# Patient Record
Sex: Female | Born: 1970 | Race: Black or African American | Hispanic: No | Smoking: Never smoker
Health system: Southern US, Community
[De-identification: ages and names within clinical notes are randomized; demographics above are authoritative.]

## PROBLEM LIST (undated history)

## (undated) DIAGNOSIS — K219 Gastro-esophageal reflux disease without esophagitis: Secondary | ICD-10-CM

## (undated) DIAGNOSIS — K859 Acute pancreatitis without necrosis or infection, unspecified: Secondary | ICD-10-CM

## (undated) DIAGNOSIS — I82409 Acute embolism and thrombosis of unspecified deep veins of unspecified lower extremity: Secondary | ICD-10-CM

## (undated) DIAGNOSIS — K5792 Diverticulitis of intestine, part unspecified, without perforation or abscess without bleeding: Secondary | ICD-10-CM

## (undated) DIAGNOSIS — I499 Cardiac arrhythmia, unspecified: Secondary | ICD-10-CM

## (undated) DIAGNOSIS — Z789 Other specified health status: Secondary | ICD-10-CM

## (undated) DIAGNOSIS — F329 Major depressive disorder, single episode, unspecified: Secondary | ICD-10-CM

## (undated) DIAGNOSIS — G4730 Sleep apnea, unspecified: Secondary | ICD-10-CM

## (undated) DIAGNOSIS — F32A Depression, unspecified: Secondary | ICD-10-CM

## (undated) DIAGNOSIS — R1310 Dysphagia, unspecified: Secondary | ICD-10-CM

## (undated) DIAGNOSIS — M1990 Unspecified osteoarthritis, unspecified site: Secondary | ICD-10-CM

## (undated) DIAGNOSIS — Z8614 Personal history of Methicillin resistant Staphylococcus aureus infection: Secondary | ICD-10-CM

## (undated) DIAGNOSIS — R5383 Other fatigue: Secondary | ICD-10-CM

## (undated) DIAGNOSIS — I2699 Other pulmonary embolism without acute cor pulmonale: Secondary | ICD-10-CM

## (undated) DIAGNOSIS — G43909 Migraine, unspecified, not intractable, without status migrainosus: Secondary | ICD-10-CM

## (undated) DIAGNOSIS — R110 Nausea: Secondary | ICD-10-CM

## (undated) DIAGNOSIS — Z9289 Personal history of other medical treatment: Secondary | ICD-10-CM

## (undated) DIAGNOSIS — J84113 Idiopathic non-specific interstitial pneumonitis: Secondary | ICD-10-CM

## (undated) DIAGNOSIS — R7303 Prediabetes: Secondary | ICD-10-CM

## (undated) DIAGNOSIS — F4310 Post-traumatic stress disorder, unspecified: Secondary | ICD-10-CM

## (undated) DIAGNOSIS — K8020 Calculus of gallbladder without cholecystitis without obstruction: Secondary | ICD-10-CM

## (undated) DIAGNOSIS — Z87442 Personal history of urinary calculi: Secondary | ICD-10-CM

## (undated) DIAGNOSIS — D649 Anemia, unspecified: Secondary | ICD-10-CM

## (undated) DIAGNOSIS — J381 Polyp of vocal cord and larynx: Secondary | ICD-10-CM

## (undated) DIAGNOSIS — J45909 Unspecified asthma, uncomplicated: Secondary | ICD-10-CM

## (undated) DIAGNOSIS — I10 Essential (primary) hypertension: Secondary | ICD-10-CM

## (undated) DIAGNOSIS — F319 Bipolar disorder, unspecified: Secondary | ICD-10-CM

## (undated) DIAGNOSIS — R499 Unspecified voice and resonance disorder: Secondary | ICD-10-CM

## (undated) DIAGNOSIS — Z22322 Carrier or suspected carrier of Methicillin resistant Staphylococcus aureus: Secondary | ICD-10-CM

## (undated) DIAGNOSIS — K921 Melena: Secondary | ICD-10-CM

## (undated) DIAGNOSIS — Z8719 Personal history of other diseases of the digestive system: Secondary | ICD-10-CM

## (undated) HISTORY — PX: DILATION AND CURETTAGE OF UTERUS: SHX78

## (undated) HISTORY — DX: Acute embolism and thrombosis of unspecified deep veins of unspecified lower extremity: I82.409

## (undated) HISTORY — PX: BACK SURGERY: SHX140

## (undated) HISTORY — PX: CHOLECYSTECTOMY: SHX55

## (undated) HISTORY — PX: WISDOM TOOTH EXTRACTION: SHX21

## (undated) HISTORY — DX: Melena: K92.1

## (undated) HISTORY — DX: Unspecified voice and resonance disorder: R49.9

## (undated) HISTORY — DX: Calculus of gallbladder without cholecystitis without obstruction: K80.20

## (undated) HISTORY — DX: Nausea: R11.0

## (undated) HISTORY — DX: Dysphagia, unspecified: R13.10

## (undated) HISTORY — DX: Migraine, unspecified, not intractable, without status migrainosus: G43.909

## (undated) HISTORY — PX: COLON SURGERY: SHX602

## (undated) HISTORY — DX: Other fatigue: R53.83

## (undated) HISTORY — PX: NASAL POLYP SURGERY: SHX186

## (undated) HISTORY — DX: Other pulmonary embolism without acute cor pulmonale: I26.99

## (undated) HISTORY — PX: COLONOSCOPY: SHX174

---
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
## 1994-07-13 HISTORY — PX: TUBAL LIGATION: SHX77

## 2000-12-31 ENCOUNTER — Other Ambulatory Visit: Admission: RE | Admit: 2000-12-31 | Discharge: 2000-12-31 | Payer: Self-pay | Admitting: Obstetrics and Gynecology

## 2001-05-19 ENCOUNTER — Ambulatory Visit (HOSPITAL_COMMUNITY): Admission: RE | Admit: 2001-05-19 | Discharge: 2001-05-19 | Payer: Self-pay | Admitting: Obstetrics and Gynecology

## 2001-05-19 ENCOUNTER — Encounter: Payer: Self-pay | Admitting: Obstetrics and Gynecology

## 2001-07-27 ENCOUNTER — Emergency Department (HOSPITAL_COMMUNITY): Admission: EM | Admit: 2001-07-27 | Discharge: 2001-07-27 | Payer: Self-pay | Admitting: Emergency Medicine

## 2001-07-30 ENCOUNTER — Emergency Department (HOSPITAL_COMMUNITY): Admission: EM | Admit: 2001-07-30 | Discharge: 2001-07-30 | Payer: Self-pay | Admitting: Emergency Medicine

## 2001-09-21 ENCOUNTER — Emergency Department (HOSPITAL_COMMUNITY): Admission: EM | Admit: 2001-09-21 | Discharge: 2001-09-21 | Payer: Self-pay | Admitting: Emergency Medicine

## 2001-09-28 ENCOUNTER — Emergency Department (HOSPITAL_COMMUNITY): Admission: EM | Admit: 2001-09-28 | Discharge: 2001-09-28 | Payer: Self-pay

## 2001-12-15 ENCOUNTER — Emergency Department (HOSPITAL_COMMUNITY): Admission: EM | Admit: 2001-12-15 | Discharge: 2001-12-15 | Payer: Self-pay | Admitting: Emergency Medicine

## 2002-04-04 ENCOUNTER — Encounter: Payer: Self-pay | Admitting: Emergency Medicine

## 2002-04-04 ENCOUNTER — Emergency Department (HOSPITAL_COMMUNITY): Admission: EM | Admit: 2002-04-04 | Discharge: 2002-04-04 | Payer: Self-pay | Admitting: Emergency Medicine

## 2002-05-06 ENCOUNTER — Emergency Department (HOSPITAL_COMMUNITY): Admission: EM | Admit: 2002-05-06 | Discharge: 2002-05-06 | Payer: Self-pay

## 2002-10-29 ENCOUNTER — Emergency Department (HOSPITAL_COMMUNITY): Admission: EM | Admit: 2002-10-29 | Discharge: 2002-10-29 | Payer: Self-pay | Admitting: Emergency Medicine

## 2003-02-03 ENCOUNTER — Emergency Department (HOSPITAL_COMMUNITY): Admission: EM | Admit: 2003-02-03 | Discharge: 2003-02-03 | Payer: Self-pay | Admitting: Emergency Medicine

## 2004-02-13 ENCOUNTER — Emergency Department (HOSPITAL_COMMUNITY): Admission: EM | Admit: 2004-02-13 | Discharge: 2004-02-13 | Payer: Self-pay | Admitting: Emergency Medicine

## 2005-07-16 ENCOUNTER — Emergency Department (HOSPITAL_COMMUNITY): Admission: EM | Admit: 2005-07-16 | Discharge: 2005-07-17 | Payer: Self-pay | Admitting: Emergency Medicine

## 2006-04-10 ENCOUNTER — Emergency Department (HOSPITAL_COMMUNITY): Admission: EM | Admit: 2006-04-10 | Discharge: 2006-04-11 | Payer: Self-pay | Admitting: Emergency Medicine

## 2008-12-09 ENCOUNTER — Emergency Department (HOSPITAL_COMMUNITY): Admission: EM | Admit: 2008-12-09 | Discharge: 2008-12-09 | Payer: Self-pay | Admitting: Emergency Medicine

## 2008-12-12 ENCOUNTER — Emergency Department (HOSPITAL_COMMUNITY): Admission: EM | Admit: 2008-12-12 | Discharge: 2008-12-12 | Payer: Self-pay | Admitting: *Deleted

## 2008-12-14 ENCOUNTER — Emergency Department (HOSPITAL_COMMUNITY): Admission: EM | Admit: 2008-12-14 | Discharge: 2008-12-14 | Payer: Self-pay | Admitting: *Deleted

## 2009-03-22 ENCOUNTER — Ambulatory Visit: Payer: Self-pay | Admitting: Diagnostic Radiology

## 2009-03-22 ENCOUNTER — Emergency Department (HOSPITAL_BASED_OUTPATIENT_CLINIC_OR_DEPARTMENT_OTHER): Admission: EM | Admit: 2009-03-22 | Discharge: 2009-03-22 | Payer: Self-pay | Admitting: Emergency Medicine

## 2009-05-10 ENCOUNTER — Emergency Department: Payer: Self-pay | Admitting: Emergency Medicine

## 2009-08-29 ENCOUNTER — Emergency Department: Payer: Self-pay | Admitting: Emergency Medicine

## 2009-09-23 ENCOUNTER — Emergency Department: Payer: Self-pay | Admitting: Emergency Medicine

## 2010-04-15 ENCOUNTER — Encounter: Payer: Self-pay | Admitting: Otolaryngology

## 2010-05-13 ENCOUNTER — Encounter: Payer: Self-pay | Admitting: Otolaryngology

## 2010-05-26 ENCOUNTER — Ambulatory Visit: Payer: Self-pay | Admitting: Unknown Physician Specialty

## 2010-08-08 ENCOUNTER — Ambulatory Visit: Payer: Self-pay | Admitting: Unknown Physician Specialty

## 2010-10-01 ENCOUNTER — Inpatient Hospital Stay (INDEPENDENT_AMBULATORY_CARE_PROVIDER_SITE_OTHER)
Admission: RE | Admit: 2010-10-01 | Discharge: 2010-10-01 | Disposition: A | Discharge status: Home / Self Care | Payer: BC Managed Care – PPO | Source: Ambulatory Visit | Attending: Emergency Medicine | Admitting: Emergency Medicine

## 2010-10-01 DIAGNOSIS — H00019 Hordeolum externum unspecified eye, unspecified eyelid: Secondary | ICD-10-CM

## 2010-10-01 DIAGNOSIS — G43009 Migraine without aura, not intractable, without status migrainosus: Secondary | ICD-10-CM

## 2010-10-17 LAB — CBC
HCT: 35 % — ABNORMAL LOW (ref 36.0–46.0)
MCHC: 33.7 g/dL (ref 30.0–36.0)
MCV: 85.4 fL (ref 78.0–100.0)
Platelets: 279 10*3/uL (ref 150–400)
RBC: 4.1 MIL/uL (ref 3.87–5.11)
WBC: 9.9 10*3/uL (ref 4.0–10.5)

## 2010-10-17 LAB — COMPREHENSIVE METABOLIC PANEL
BUN: 6 mg/dL (ref 6–23)
CO2: 27 mEq/L (ref 19–32)
Chloride: 105 mEq/L (ref 96–112)
Creatinine, Ser: 0.8 mg/dL (ref 0.4–1.2)
GFR calc non Af Amer: >60 mL/min (ref >60)
Total Bilirubin: 0.2 mg/dL — ABNORMAL LOW (ref 0.3–1.2)

## 2010-10-17 LAB — URINALYSIS, ROUTINE W REFLEX MICROSCOPIC
Hgb urine dipstick: NEGATIVE
Protein, ur: NEGATIVE mg/dL
Urobilinogen, UA: 0.2 mg/dL (ref 0.0–1.0)

## 2010-10-17 LAB — DIFFERENTIAL
Basophils Absolute: 0 10*3/uL (ref 0.0–0.1)
Lymphocytes Relative: 37 % (ref 12–46)
Neutro Abs: 5.2 10*3/uL (ref 1.7–7.7)
Neutrophils Relative %: 53 % (ref 43–77)

## 2010-10-17 LAB — LIPASE, BLOOD: Lipase: 276 U/L (ref 23–300)

## 2010-10-17 LAB — PREGNANCY, URINE: Preg Test, Ur: NEGATIVE

## 2010-10-20 LAB — POCT PREGNANCY, URINE: Preg Test, Ur: NEGATIVE

## 2010-10-21 LAB — DIFFERENTIAL
Basophils Relative: 1 % (ref 0–1)
Lymphocytes Relative: 25 % (ref 12–46)
Monocytes Absolute: 1.2 10*3/uL — ABNORMAL HIGH (ref 0.1–1.0)
Monocytes Relative: 10 % (ref 3–12)
Neutro Abs: 7.9 10*3/uL — ABNORMAL HIGH (ref 1.7–7.7)

## 2010-10-21 LAB — COMPREHENSIVE METABOLIC PANEL
Albumin: 3.6 g/dL (ref 3.5–5.2)
Alkaline Phosphatase: 40 U/L (ref 39–117)
BUN: 6 mg/dL (ref 6–23)
Potassium: 3.4 mEq/L — ABNORMAL LOW (ref 3.5–5.1)
Total Protein: 6.5 g/dL (ref 6.0–8.3)

## 2010-10-21 LAB — CBC
HCT: 38.3 % (ref 36.0–46.0)
Platelets: 214 10*3/uL (ref 150–400)
RDW: 14.2 % (ref 11.5–15.5)

## 2011-04-06 ENCOUNTER — Emergency Department (HOSPITAL_COMMUNITY): Payer: BC Managed Care – PPO

## 2011-04-06 ENCOUNTER — Emergency Department (HOSPITAL_COMMUNITY)
Admission: EM | Admit: 2011-04-06 | Discharge: 2011-04-06 | Disposition: A | Discharge status: Home / Self Care | Payer: BC Managed Care – PPO | Attending: Emergency Medicine | Admitting: Emergency Medicine

## 2011-04-06 DIAGNOSIS — R079 Chest pain, unspecified: Secondary | ICD-10-CM | POA: Insufficient documentation

## 2011-04-06 DIAGNOSIS — M940 Chondrocostal junction syndrome [Tietze]: Secondary | ICD-10-CM | POA: Insufficient documentation

## 2011-04-06 DIAGNOSIS — K859 Acute pancreatitis without necrosis or infection, unspecified: Secondary | ICD-10-CM | POA: Insufficient documentation

## 2011-04-06 DIAGNOSIS — R109 Unspecified abdominal pain: Secondary | ICD-10-CM | POA: Insufficient documentation

## 2011-04-06 DIAGNOSIS — R110 Nausea: Secondary | ICD-10-CM | POA: Insufficient documentation

## 2011-04-06 LAB — COMPREHENSIVE METABOLIC PANEL
ALT: 29 U/L (ref 0–35)
AST: 21 U/L (ref 0–37)
Alkaline Phosphatase: 42 U/L (ref 39–117)
CO2: 28 mEq/L (ref 19–32)
GFR calc Af Amer: >60 mL/min (ref >60)
Glucose, Bld: 88 mg/dL (ref 70–99)
Potassium: 4.1 mEq/L (ref 3.5–5.1)
Sodium: 138 mEq/L (ref 135–145)
Total Protein: 7.4 g/dL (ref 6.0–8.3)

## 2011-04-06 LAB — URINALYSIS, ROUTINE W REFLEX MICROSCOPIC
Glucose, UA: NEGATIVE mg/dL
Hgb urine dipstick: NEGATIVE
Specific Gravity, Urine: 1.012 (ref 1.005–1.030)
pH: 6 (ref 5.0–8.0)

## 2011-04-06 LAB — DIFFERENTIAL
Basophils Absolute: 0.1 10*3/uL (ref 0.0–0.1)
Basophils Relative: 1 % (ref 0–1)
Neutro Abs: 5 10*3/uL (ref 1.7–7.7)
Neutrophils Relative %: 53 % (ref 43–77)

## 2011-04-06 LAB — URINE MICROSCOPIC-ADD ON

## 2011-04-06 LAB — CBC
Hemoglobin: 13.5 g/dL (ref 12.0–15.0)
MCHC: 34.4 g/dL (ref 30.0–36.0)
Platelets: 239 10*3/uL (ref 150–400)
RBC: 4.74 MIL/uL (ref 3.87–5.11)

## 2011-04-06 LAB — CK TOTAL AND CKMB (NOT AT ARMC): Relative Index: 1.2 (ref 0.0–2.5)

## 2011-04-09 ENCOUNTER — Other Ambulatory Visit: Payer: Self-pay | Admitting: Specialist

## 2011-04-09 DIAGNOSIS — R1011 Right upper quadrant pain: Secondary | ICD-10-CM

## 2011-04-10 ENCOUNTER — Ambulatory Visit
Admission: RE | Admit: 2011-04-10 | Discharge: 2011-04-10 | Disposition: A | Discharge status: Home / Self Care | Payer: BC Managed Care – PPO | Source: Ambulatory Visit | Attending: Specialist | Admitting: Specialist

## 2011-04-10 DIAGNOSIS — R1011 Right upper quadrant pain: Secondary | ICD-10-CM

## 2011-04-10 DIAGNOSIS — K8020 Calculus of gallbladder without cholecystitis without obstruction: Secondary | ICD-10-CM

## 2011-04-10 HISTORY — DX: Calculus of gallbladder without cholecystitis without obstruction: K80.20

## 2011-04-23 ENCOUNTER — Encounter (INDEPENDENT_AMBULATORY_CARE_PROVIDER_SITE_OTHER): Payer: Self-pay | Admitting: Surgery

## 2011-04-23 ENCOUNTER — Ambulatory Visit (INDEPENDENT_AMBULATORY_CARE_PROVIDER_SITE_OTHER): Payer: BC Managed Care – PPO | Admitting: Surgery

## 2011-04-23 ENCOUNTER — Inpatient Hospital Stay (HOSPITAL_COMMUNITY)
Admission: AD | Admit: 2011-04-23 | Discharge: 2011-04-26 | DRG: 494 | Disposition: A | Discharge status: Home / Self Care | Payer: BC Managed Care – PPO | Source: Ambulatory Visit | Attending: Surgery | Admitting: Surgery

## 2011-04-23 VITALS — BP 114/86 | HR 64 | Temp 96.6°F | Resp 20 | Ht 67.5 in | Wt 261.4 lb

## 2011-04-23 DIAGNOSIS — Z79899 Other long term (current) drug therapy: Secondary | ICD-10-CM

## 2011-04-23 DIAGNOSIS — K812 Acute cholecystitis with chronic cholecystitis: Secondary | ICD-10-CM

## 2011-04-23 DIAGNOSIS — K8000 Calculus of gallbladder with acute cholecystitis without obstruction: Principal | ICD-10-CM | POA: Diagnosis present

## 2011-04-23 DIAGNOSIS — K8010 Calculus of gallbladder with chronic cholecystitis without obstruction: Secondary | ICD-10-CM | POA: Diagnosis present

## 2011-04-23 LAB — COMPREHENSIVE METABOLIC PANEL
Alkaline Phosphatase: 37 U/L — ABNORMAL LOW (ref 39–117)
BUN: 9 mg/dL (ref 6–23)
Chloride: 103 mEq/L (ref 96–112)
GFR calc Af Amer: >90 mL/min (ref >90)
Glucose, Bld: 85 mg/dL (ref 70–99)
Potassium: 4.2 mEq/L (ref 3.5–5.1)
Total Bilirubin: 0.2 mg/dL — ABNORMAL LOW (ref 0.3–1.2)

## 2011-04-23 LAB — CBC
Hemoglobin: 12.2 g/dL (ref 12.0–15.0)
MCH: 27.9 pg (ref 26.0–34.0)
MCV: 82.6 fL (ref 78.0–100.0)
RBC: 4.38 MIL/uL (ref 3.87–5.11)

## 2011-04-23 LAB — AMYLASE: Amylase: 110 U/L — ABNORMAL HIGH (ref 0–105)

## 2011-04-23 LAB — LIPASE, BLOOD: Lipase: 111 U/L — ABNORMAL HIGH (ref 11–59)

## 2011-04-23 NOTE — Patient Instructions (Signed)
Gallbladder Disease (Cholecystitis) Gallbladder disease (cholecystitis) is an inflammation of your gallbladder. It is usually caused by a build-up of stones (gallstones) or sludge (cholelithiasis) in your gallbladder. The gallbladder is not an essential organ. It is located slightly to the right of center in the belly (abdomen), behind the liver. It stores bile made in the liver. Bile aids in digestion of fats. Gallbladder disease may result in nausea (feeling sick to your stomach), abdominal pain, and jaundice. In severe cases, emergency surgery may be required. The most common type of gallbladder disease is gallstones. They begin as small crystals and slowly grow into stones. Gallstone pain occurs when the bile duct has spasms. The spasms are caused by the stone passing out of the duct. The stone is trying to pass at the same time bile is passing into the small bowel for digestion. The pain usually begins suddenly. It may persist from several minutes to several hours. Infection can occur. Infection can add to discomfort and severity of an acute attack. The pain may be made worse by breathing deeply or by being jarred. There may be fever and tenderness to the touch. In some cases, when gallstones do not move into the bile duct, people have no pain or symptoms. These are called "silent" gallstones. Women are three times more likely to develop gallstones than men. Women who have had several pregnancies are more likely to have gallbladder disease. Physicians sometimes advise removing diseased gallbladders before future pregnancies. Other factors that increase the risk of gallbladder disease are obesity, diets heavy in fried foods and dairy products, increasing age, prolonged use of medications containing female hormones, and heredity. HOME CARE INSTRUCTIONS  If your physician prescribed an antibiotic, take as directed.   Only take over-the-counter or prescription medicines for pain, discomfort, or fever as  directed by your caregiver.   Follow a low fat diet until seen again. (Fat causes the gallbladder to contract.)   Follow-up as instructed. Attacks are almost always recurrent and surgery is usually required for permanent treatment.  SEEK IMMEDIATE MEDICAL CARE IF:  Pain is increasing and not controlled by medications.   The pain moves to another part of your abdomen or to your back. (Right sided pain can be appendicitis and left sided pain in adults can be diverticulitis).   You have an oral temperature above 101 develops, is not controlled by medication, or as your caregiver suggests.   You develop nausea and vomiting.  Document Released: 06/29/2005 Document Re-Released: 12/16/2007 [facility] Patient Information 2011 [place], [place].

## 2011-04-23 NOTE — Progress Notes (Signed)
Chief Complaint  Patient presents with  . Other    new pt eval of GB with stones and rip in the bile duct     HPI Lisa [name] is a 40 y.o. female.   HPI The patient presents with a three-day history of epigastric and right upper quarter pain. She seen emergency room about 3 weeks ago for chest pain. She is found to have pancreatitis with a lipase of 294 he was discharged home. She had a follow up ultrasound 928 2012 which showed multiple gallstones and a common bile duct measuring 9.6 mm. Over the last 3 days, she developed more right upper quarter pain 5-6/10 with associated nausea no vomiting. She had loss of appetite. The pain is severe. She denies any fever or chills.  Past Medical History  Diagnosis Date  . Trouble swallowing   . Change in voice   . Chest pain   . Abdominal pain   . Blood in stool   . Constipation   . Nausea   . Migraines   . Fatigue     Past Surgical History  Procedure Date  . Nasal polyp surgery   . Wisdom tooth extraction     History reviewed. No pertinent family history.  Social History History  Substance Use Topics  . Smoking status: Never Smoker   . Smokeless tobacco: Never Used  . Alcohol Use: No    No Known Allergies  Current Outpatient Prescriptions  Medication Sig Dispense Refill  . famotidine (PEPCID) 20 MG tablet Take 20 mg by mouth daily.        . methocarbamol (ROBAXIN) 500 MG tablet Take 500 mg by mouth 2 (two) times daily.        Marland Kitchen omeprazole (PRILOSEC) 20 MG capsule Take 20 mg by mouth 2 (two) times daily.          Review of Systems Review of Systems  Constitutional: Positive for activity change and appetite change. Negative for fever and chills.  HENT: Negative.   Eyes: Negative.   Cardiovascular: Negative.   Gastrointestinal: Positive for nausea and abdominal pain.  Genitourinary: Negative.   Musculoskeletal: Negative.   Skin: Negative.   Hematological: Negative.   Psychiatric/Behavioral: Negative.      Blood pressure 114/86, pulse 64, temperature 96.6 F (35.9 C), resp. rate 20, height 5' 7.5" (1.715 m), weight 261 lb 6 oz (118.559 kg), last menstrual period 03/15/2011.  Physical Exam Physical Exam  Constitutional: She is oriented to person, place, and time. She appears well-developed and well-nourished.  HENT:  Head: Normocephalic and atraumatic.  Eyes: EOM are normal. Pupils are equal, round, and reactive to light.  Neck: Normal range of motion. Neck supple.  Cardiovascular: Normal rate, regular rhythm and normal heart sounds.   Pulmonary/Chest: Effort normal and breath sounds normal.  Abdominal: There is tenderness.       RUQ tenderness to palpation.  Positive Murphy's sign.  Musculoskeletal: Normal range of motion.  Neurological: She is alert and oriented to person, place, and time.  Skin: Skin is warm and dry.    Data Reviewed U/S  04/10/2011  MULTIPLE GALLSTONES cbd 9.6 mm  Lipase 294 04/06/2011  96 on 04/10/2011 Assessment    Acute on chronic cholecystitis History of gallstone pancreatitis    Plan    Admit to [facility] MD service at [facility] Dr  Jamey Ripa  to follow up in am.  IVF,  ABX probable laparoscopic cholecystectomy and cholangiogram.       Booker Bhatnagar  A. 04/23/2011, 5:30 PM

## 2011-04-24 ENCOUNTER — Inpatient Hospital Stay (HOSPITAL_COMMUNITY): Payer: BC Managed Care – PPO

## 2011-04-24 DIAGNOSIS — K8010 Calculus of gallbladder with chronic cholecystitis without obstruction: Secondary | ICD-10-CM

## 2011-04-24 LAB — COMPREHENSIVE METABOLIC PANEL
Albumin: 3.2 g/dL — ABNORMAL LOW (ref 3.5–5.2)
Alkaline Phosphatase: 34 U/L — ABNORMAL LOW (ref 39–117)
BUN: 8 mg/dL (ref 6–23)
Creatinine, Ser: 0.72 mg/dL (ref 0.50–1.10)
Potassium: 3.5 mEq/L (ref 3.5–5.1)
Total Protein: 6.3 g/dL (ref 6.0–8.3)

## 2011-04-24 LAB — LIPASE, BLOOD
Lipase: 78 U/L — ABNORMAL HIGH (ref 11–59)
Lipase: 88 U/L — ABNORMAL HIGH (ref 11–59)

## 2011-04-24 LAB — CBC
Hemoglobin: 11.7 g/dL — ABNORMAL LOW (ref 12.0–15.0)
MCHC: 34 g/dL (ref 30.0–36.0)
RBC: 4.17 MIL/uL (ref 3.87–5.11)
WBC: 7.9 10*3/uL (ref 4.0–10.5)

## 2011-04-24 LAB — TYPE AND SCREEN
ABO/RH(D): B POS
Antibody Screen: NEGATIVE

## 2011-04-24 LAB — HEPATIC FUNCTION PANEL: Total Protein: 7.1 g/dL (ref 6.0–8.3)

## 2011-04-24 LAB — PREGNANCY, URINE: Preg Test, Ur: NEGATIVE

## 2011-04-24 LAB — ABO/RH: ABO/RH(D): B POS

## 2011-04-24 MED ORDER — IOHEXOL 300 MG/ML  SOLN
100.0000 mL | Freq: Once | INTRAMUSCULAR | Status: AC | PRN
  Administered 2011-04-24: 100 mL via INTRAVENOUS

## 2011-04-25 ENCOUNTER — Other Ambulatory Visit (INDEPENDENT_AMBULATORY_CARE_PROVIDER_SITE_OTHER): Payer: Self-pay | Admitting: Surgery

## 2011-04-25 ENCOUNTER — Inpatient Hospital Stay (HOSPITAL_COMMUNITY): Payer: BC Managed Care – PPO

## 2011-04-25 ENCOUNTER — Encounter (INDEPENDENT_AMBULATORY_CARE_PROVIDER_SITE_OTHER): Payer: Self-pay | Admitting: Surgery

## 2011-04-25 HISTORY — PX: LAPAROSCOPIC CHOLECYSTECTOMY W/ CHOLANGIOGRAPHY: SUR757

## 2011-04-25 LAB — CBC
HCT: 35.2 % — ABNORMAL LOW (ref 36.0–46.0)
Hemoglobin: 12 g/dL (ref 12.0–15.0)
MCH: 28.4 pg (ref 26.0–34.0)
MCHC: 34.1 g/dL (ref 30.0–36.0)
MCV: 83.2 fL (ref 78.0–100.0)
Platelets: 241 10*3/uL (ref 150–400)
RBC: 4.23 MIL/uL (ref 3.87–5.11)
RDW: 13.3 % (ref 11.5–15.5)
WBC: 6.6 10*3/uL (ref 4.0–10.5)

## 2011-04-25 LAB — HEPATIC FUNCTION PANEL
AST: 16 U/L (ref 0–37)
Albumin: 3.1 g/dL — ABNORMAL LOW (ref 3.5–5.2)
Alkaline Phosphatase: 35 U/L — ABNORMAL LOW (ref 39–117)
Total Bilirubin: 0.3 mg/dL (ref 0.3–1.2)

## 2011-04-25 NOTE — H&P (Signed)
  NAMEDAVIA, SMYRE NO.:  [id number]  MEDICAL RECORD NO.:  [id number]  LOCATION:  5017                         FACILITY:  [facility]  PHYSICIAN:  Maisie Fus A. Tawnia Schirm, M.D.DATE OF BIRTH:  1970-09-14  DATE OF ADMISSION:  04/23/2011 DATE OF DISCHARGE:  04/06/2011                             HISTORY & PHYSICAL   CHIEF COMPLAINT:  Abdominal pain.  HISTORY OF PRESENT ILLNESS:  The patient is a 40 year old female with right upper quadrant pain x3 days, severe in nature, located in right upper quadrant, radiation in to her chest.  She was seen in about 3 weeks ago in the emergency room due to pancreatitis.  She was sent home and had a followup ultrasound showed multiple gallstones and a common bile duct measuring 9.6 mm.  PAST MEDICAL HISTORY:  Vocal cord polyps, constipation, nausea, fatigue.  PAST SURGICAL HISTORY:  Nasal polyp surgery and wisdom tooth extraction.  SOCIAL HISTORY:  Does not smoke or drink alcohol.  MEDICATIONS:1. Pepcid 20 mg daily. 2. Robaxin 500 mg daily. 3. Prilosec 20 mg daily.  FAMILY HISTORY:  Noncontributory.  SOCIAL HISTORY:  Denies tobacco use.  PHYSICAL EXAMINATION:  HEENT:  No jaundice.  Oropharynx moist. NECK:  Supple.  Nontender.  Trachea midline. PULMONARY:  Lung sounds are clear. CARDIOVASCULAR:  Regular rate and rhythm without rub, murmur, or gallop. ABDOMEN:  Tender in right upper quadrant.  Positive Murphy sign. EXTREMITIES:  No edema.  DIAGNOSTIC STUDIES:  Ultrasound from April 10, 2011, showed small gallstones in common duct which is 9.6 mm.  Lipase on April 06, 2011 was 294.  IMPRESSION: 1. Acute on chronic cholecystitis. 2. History of gallstone pancreatitis.  PLAN:  To be admitted for IV fluids under Dr. Maia Plan service at [facility].  Continue IV antibiotics and probable laparoscopic cholecystectomy with cholangiogram in the next 24-48 hours.     Zaryan Yakubov A. Sid Greener, M.D.     TAC/MEDQ  D:   04/23/2011  T:  04/23/2011  Job:  [id number]  Electronically Signed by Harriette Bouillon M.D. on 04/25/2011 09:22:27 PM

## 2011-04-26 NOTE — Op Note (Signed)
NAMEMarland Kitchen  Lisa [name], Lisa [name] NO.:  [id number]  MEDICAL RECORD NO.:  [id number]  LOCATION:  5017                         FACILITY:  [facility]  PHYSICIAN:  Currie Paris, M.D.DATE OF BIRTH:  1971-03-28  DATE OF PROCEDURE: DATE OF DISCHARGE:                              OPERATIVE REPORT   PREOPERATIVE DIAGNOSIS:  Gallstones with recent biliary pancreatitis.  POSTOPERATIVE DIAGNOSIS:  Gallstones with recent biliary pancreatitis.  PROCEDURE:  Laparoscopic cholecystectomy with operative cholangiogram.  SURGEON:  Currie Paris, MD  ASSISTANT:  Clovis Pu. Cornett, MD  ANESTHESIA:  General endotracheal.  CLINICAL HISTORY:  This is a 40 year old woman who was diagnosed with a mild case of biliary pancreatitis and gallstones a few weeks ago, was sent home from the emergency department and came to our office on April 23, 2011, with increasing pain.  She was admitted that evening and had some elevated pancreatic function tests.  The next day, she continued to have some pain and some elevated function tests, so we obtained a CT scan which basically showed no significant pancreatitis with very mild changes.  This was thought, therefore, to be a very mild biliary pancreatitis and gallstones, so we elected to proceed to surgery today for cholecystectomy with cholangiogram.  DESCRIPTION OF PROCEDURE:  I saw the patient again in the holding area and confirmed that she had no further questions and understood the plans for the procedure.  She was then taken to the operating room and after satisfactory general endotracheal anesthesia had been obtained, the abdomen was prepped and draped.  The time-out was done.  Plain Marcaine 0.25% was used for each incision.  The umbilical incision was made, the fascia identified and opened, and the peritoneal cavity entered under direct vision.  A pursestring was placed, the Hasson introduced, and the abdomen insufflated to  15.  Exam of the abdomen with the camera in did not show any gross abnormalities.  The gallbladder was somewhat contracted as previously noted on an ultrasound.  The patient was placed in reverse Trendelenburg and tilted to the left. Under direct vision, a 10 x 11 trocar was placed in the epigastrium and two 5 mm laterally.  The gallbladder was retracted over the liver and the infundibulum retracted inferolaterally.  I opened the peritoneum over the cystic duct and identified a nice long segment of cystic duct and a segment of cystic artery.  I put a clip on the cystic duct at the junction of the gallbladder and one on the cystic artery. A Cook catheter was introduced percutaneously and threaded into the cystic duct and operative angiography done.  There appeared to be no gross abnormalities with good filling of the duodenum, although the duct was a little bit plump.  The catheter was removed and 3 clips placed on the cystic duct and it was divided completely.  The cystic artery was divided after additional clips were applied.  A posterior branch was clipped and divided.  The gallbladder was removed from below to above with coagulation current of the cautery.  It was placed in a bag.  We made sure the bed of the gallbladder was dry.  The gallbladder was brought out of the umbilical port site.  The abdomen  was reinsufflated and there had been no bleeding.  The lateral ports were removed under direct vision and they did not bleed.  The umbilical site was closed with a pursestring and we had a good closure checking with the camera in the epigastric port.  The abdomen was deflated through the epigastric port.  Skin was closed with 4-0 Monocryl subcuticular plus Dermabond.  The patient tolerated the procedure well.  There were no operative complications.  All counts were correct.     Currie Paris, M.D.     CJS/MEDQ  D:  04/25/2011  T:  04/25/2011  Job:   [id number]  Electronically Signed by Cyndia Bent M.D. on 04/26/2011 07:42:40 AM

## 2011-05-01 ENCOUNTER — Encounter (INDEPENDENT_AMBULATORY_CARE_PROVIDER_SITE_OTHER): Payer: BC Managed Care – PPO | Admitting: General Surgery

## 2011-05-13 ENCOUNTER — Encounter (INDEPENDENT_AMBULATORY_CARE_PROVIDER_SITE_OTHER): Payer: Self-pay

## 2011-05-14 ENCOUNTER — Encounter (INDEPENDENT_AMBULATORY_CARE_PROVIDER_SITE_OTHER): Payer: Self-pay | Admitting: Surgery

## 2011-05-14 ENCOUNTER — Ambulatory Visit (INDEPENDENT_AMBULATORY_CARE_PROVIDER_SITE_OTHER): Payer: BC Managed Care – PPO | Admitting: Surgery

## 2011-05-14 VITALS — BP 132/88 | HR 80 | Temp 97.8°F | Resp 14 | Ht 67.5 in | Wt 257.4 lb

## 2011-05-14 DIAGNOSIS — K8020 Calculus of gallbladder without cholecystitis without obstruction: Secondary | ICD-10-CM

## 2011-05-14 LAB — LIPASE: Lipase: 123 U/L — ABNORMAL HIGH (ref 0–75)

## 2011-05-14 LAB — COMPREHENSIVE METABOLIC PANEL
CO2: 24 mEq/L (ref 19–32)
Creat: 0.78 mg/dL (ref 0.50–1.10)
Glucose, Bld: 88 mg/dL (ref 70–99)
Total Bilirubin: 0.5 mg/dL (ref 0.3–1.2)
Total Protein: 7 g/dL (ref 6.0–8.3)

## 2011-05-14 MED ORDER — OXYCODONE HCL 5 MG PO TABS: 5.0000 mg | ORAL_TABLET | Freq: Four times a day (QID) | ORAL | Status: AC | PRN

## 2011-05-14 NOTE — Patient Instructions (Signed)
We will check some labs on it today. I did give him some additional pain medication. She continued to have pain after another week or 10 days getting another call for followup.

## 2011-05-14 NOTE — Progress Notes (Signed)
NAME: Lisa [name]       DOB: 1971/05/05           DATE: 05/14/2011       ZOX:[id number]   CC: Postop laparoscopic cholecystectomy  HPI:  This patient underwent a laparoscopic cholecystectomy with operative cholangiogram on 04/25/2011. She is in for her first postoperative visit. She is still having abdominal pain although it is getting better. She is also still having some nausea and not eating well. The pain is not well localized and seems to be diffuse her abdomen. She is not having diarrhea. She is to give any episodes of biliary colic symptoms she did have some mild hyperamylasemia prior to her surgery. PE: General: The patient is alert and appears mildly uncomfortable, NAD.  Abdomen: Soft and benign. The incisions are healing nicely. There are no apparent problems.  Data reviewed: IOC:  WNL Pathology:  Chronic calculous cholecystitis  Impression:  The patient appears to be doing well, with improvement in her symptoms.  Plan: Although her abdomen I think is benign, we will check some labs on her today. I gave her a prescription for 30 oxycodone to help with her pain. If her labs are normal we may need to get further GI evaluation.

## 2011-05-15 ENCOUNTER — Ambulatory Visit
Admission: RE | Admit: 2011-05-15 | Discharge: 2011-05-15 | Disposition: A | Discharge status: Home / Self Care | Payer: BC Managed Care – PPO | Source: Ambulatory Visit | Attending: Surgery | Admitting: Surgery

## 2011-05-15 ENCOUNTER — Telehealth (INDEPENDENT_AMBULATORY_CARE_PROVIDER_SITE_OTHER): Payer: Self-pay | Admitting: General Surgery

## 2011-05-15 MED ORDER — IOHEXOL 300 MG/ML  SOLN: 125.0000 mL | Freq: Once | INTRAMUSCULAR | Status: AC | PRN

## 2011-05-15 NOTE — Telephone Encounter (Signed)
Patient aware labs are elevated and she needs CT today. Order given to Stanton Kidney and she will call patient with an appt.

## 2011-05-15 NOTE — Telephone Encounter (Signed)
Patient aware CT looks ok. No abscess, infection, or pancreatitis noted. Made patient aware it did show some constipation. Patient is taking a fiber supplement and miralax daily. Advised to increase her water intake and increase activity, as far as walking around. Patient will call back with any problems and follow up at her next scheduled office visit.

## 2011-05-15 NOTE — Telephone Encounter (Signed)
Called patient about lab results. LMOM to call back today.

## 2011-05-15 NOTE — Telephone Encounter (Signed)
Message copied by Liliana Cline on Fri May 15, 2011 10:51 AM ------      Message from: Currie Paris      Created: Fri May 15, 2011  5:49 AM       She has an elevated lipase and might have some pancreatitis as the cause of her pain - we need to get a abd/pelvis CT with contrast today

## 2011-05-22 ENCOUNTER — Telehealth (INDEPENDENT_AMBULATORY_CARE_PROVIDER_SITE_OTHER): Payer: Self-pay | Admitting: General Surgery

## 2011-05-22 NOTE — Telephone Encounter (Signed)
Patient still having post op pain. No worse but no better. Some nausea and one episode of vomiting last night. Having bowel movements 2-3 x week. Spoke with Dr Jamey Ripa who advised she see a GI doctor. She has seen Dr Markham Jordan in [place] a long time ago, but no longer lives there and wants to see someone in [place]. I will made referral and call her back.

## 2011-05-26 ENCOUNTER — Telehealth (INDEPENDENT_AMBULATORY_CARE_PROVIDER_SITE_OTHER): Payer: Self-pay | Admitting: General Surgery

## 2011-05-26 NOTE — Telephone Encounter (Signed)
PT CALLED RE REFERRAL TO GI DR. HAS NOT HEARD ANYTHING YET. MESSAGE GIVEN TO JADE.

## 2011-05-28 ENCOUNTER — Encounter: Payer: Self-pay | Admitting: Gastroenterology

## 2011-05-28 ENCOUNTER — Ambulatory Visit (INDEPENDENT_AMBULATORY_CARE_PROVIDER_SITE_OTHER): Payer: BC Managed Care – PPO | Admitting: Gastroenterology

## 2011-05-28 DIAGNOSIS — K625 Hemorrhage of anus and rectum: Secondary | ICD-10-CM

## 2011-05-28 DIAGNOSIS — R1084 Generalized abdominal pain: Secondary | ICD-10-CM | POA: Insufficient documentation

## 2011-05-28 DIAGNOSIS — Z1211 Encounter for screening for malignant neoplasm of colon: Secondary | ICD-10-CM

## 2011-05-28 DIAGNOSIS — K219 Gastro-esophageal reflux disease without esophagitis: Secondary | ICD-10-CM

## 2011-05-28 MED ORDER — LACTULOSE 10 GM/15ML PO SOLN: ORAL | Status: DC

## 2011-05-28 MED ORDER — HYOSCYAMINE SULFATE ER 0.375 MG PO TBCR: EXTENDED_RELEASE_TABLET | ORAL | Status: DC

## 2011-05-28 NOTE — Progress Notes (Signed)
HPI: Lisa [name] is a 40-year-old Afro-American female for at the request of Dr. Cornett for evaluation of abdominal pain. For several months she's been complaining of diffuse abdominal pain beginning in her upper abdomen and radiating to the lower abdomen. It is a constant aching pain and occasionally sharp pain. It is unaffected by eating or moving her bowels. She suffers from chronic constipation and may only have 2-3 bowel movements a week. She was admitted in the latter part of September with gallstone pancreatitis. She subsequently underwent a laparoscopic cholecystectomy. IOC was negative. Pain has not changed since the surgery. She underwent a CT scan which demonstrated constipation only. She has seen small amounts of blood on the toilet tissue and a  tan stool. She  underwent colonoscopy a year ago where diverticulosis only was seen. She denies nausea.  Her other complaint is hoarseness and pyrosis.  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 She has frequent pyrosis despite taking Pepcid and omeprazole. She denies dysphagia but she has difficulty sometimes initiating the swallow.      Past Medical History  Diagnosis Date  . Trouble swallowing   . Change in voice   . Chest pain   . Abdominal pain   . Blood in stool   . Constipation   . Nausea   . Migraines   . Fatigue   . Gallstones 04/10/2011    with biliary pancreatitis   Past Surgical History  Procedure Date  . Nasal polyp surgery     polyp from vocal cords  . Wisdom tooth extraction   . Laparoscopic cholecystectomy w/ cholangiography 04/25/2011    Dr Streck   family history includes Diabetes in her mother; Lung cancer in her father; and Pancreatic cancer in her mother. Current Outpatient Prescriptions  Medication Sig Dispense Refill  . famotidine (PEPCID) 20 MG tablet Take 20 mg by mouth daily.        . methocarbamol (ROBAXIN) 500 MG tablet Take 500 mg by mouth 2 (two) times daily.        .  omeprazole (PRILOSEC) 20 MG capsule Take 20 mg by mouth 2 (two) times daily.        . Polyethylene Glycol 3350 (MIRALAX PO) Take by mouth as needed.         Allergies as of 05/28/2011  . (No Known Allergies)    reports that she has never smoked. She has never used smokeless tobacco. She reports that she does not drink alcohol or use illicit drugs.     Review of Systems: Pertinent positive and negative review of systems were noted in the above HPI section. All other review of systems were otherwise negative.  Vital signs were reviewed in today's medical record Physical Exam: General: Well developed , well nourished, no acute distress Head: Normocephalic and atraumatic Eyes:  sclerae anicteric, EOMI Ears: Normal auditory acuity Mouth: No deformity or lesions Neck: Supple, no masses or thyromegaly Lungs: Clear throughout to auscultation Heart: Regular rate and rhythm; no murmurs, rubs or bruits Abdomen: Soft,non distended. No masses,; there is mild tenderness to palpation bilaterally in the lower quadrants hepatosplenomegaly or hernias noted. Normal Bowel sounds Rectal:deferred Musculoskeletal: Symmetrical with no gross deformities  Skin: No lesions on visible extremities Pulses:  Normal pulses noted Extremities: No clubbing, cyanosis, edema or deformities noted Neurological: Alert oriented x 4, grossly nonfocal Cervical Nodes:  No significant cervical adenopathy Inguinal Nodes: No significant inguinal adenopathy Psychological:  Alert and cooperative. Normal mood and affect       

## 2011-05-28 NOTE — Assessment & Plan Note (Signed)
Pain could be do to chronic constipation. It is doubtful that she had a sequela from her surgery since the pain preceded surgery.  Recommendations #1 begin lactulose 15-30 cc twice a day #2 hyomax 0.375 mg twice a day

## 2011-05-28 NOTE — Assessment & Plan Note (Addendum)
The patient appears to have symptoms refractory to medical therapy.  Medications #1 continue current medications #2 Bravo pH study #3 to consider fundoplication if she is having acid reflux in the face of maximal medical therapy

## 2011-05-28 NOTE — Patient Instructions (Signed)
You will go to the basement today for your Hemoccult  Kit, please return as soon as possible Your Endoscopy is scheduled on 06/02/2011 at 12:30pm at [facility] We have sent your medications to your pharmacy We have written a release for work for you from 05/28/2011 until 06/08/2011.  We have faxed that in to your Employer for you Lisa [name] at [phone]

## 2011-06-01 ENCOUNTER — Telehealth (INDEPENDENT_AMBULATORY_CARE_PROVIDER_SITE_OTHER): Payer: Self-pay

## 2011-06-01 ENCOUNTER — Encounter (HOSPITAL_COMMUNITY): Payer: Self-pay | Admitting: *Deleted

## 2011-06-01 NOTE — Telephone Encounter (Signed)
Bracy with dis.company left message on Jade's v.m. I returned the call but I had to leave message stating that the pt did receive a RTW note from Dr Jamey Ripa for the pt to return to work on 06-01-11 but the pt saw Dr Melvia Heaps on 05-28-11. Dr Arlyce Dice gave the pt another RTW note keeping the pt out of work till 07-08-11 b/c of the pt having to go get some more tests from their office. I advised Bracy that she might need to call their office about more info or the pt take more forms over to Dr Marzetta Board office./ [facility]

## 2011-06-02 ENCOUNTER — Encounter (HOSPITAL_COMMUNITY)
Admission: RE | Disposition: A | Discharge status: Home / Self Care | Payer: Self-pay | Source: Ambulatory Visit | Attending: Gastroenterology

## 2011-06-02 ENCOUNTER — Other Ambulatory Visit: Payer: Self-pay | Admitting: Gastroenterology

## 2011-06-02 ENCOUNTER — Other Ambulatory Visit: Payer: BC Managed Care – PPO

## 2011-06-02 ENCOUNTER — Ambulatory Visit (HOSPITAL_COMMUNITY)
Admission: RE | Admit: 2011-06-02 | Discharge: 2011-06-02 | Disposition: A | Discharge status: Home / Self Care | Payer: BC Managed Care – PPO | Source: Ambulatory Visit | Attending: Gastroenterology | Admitting: Gastroenterology

## 2011-06-02 ENCOUNTER — Encounter (HOSPITAL_COMMUNITY): Payer: Self-pay | Admitting: *Deleted

## 2011-06-02 DIAGNOSIS — K219 Gastro-esophageal reflux disease without esophagitis: Secondary | ICD-10-CM | POA: Insufficient documentation

## 2011-06-02 DIAGNOSIS — Z1211 Encounter for screening for malignant neoplasm of colon: Secondary | ICD-10-CM

## 2011-06-02 DIAGNOSIS — R1084 Generalized abdominal pain: Secondary | ICD-10-CM

## 2011-06-02 HISTORY — DX: Gastro-esophageal reflux disease without esophagitis: K21.9

## 2011-06-02 HISTORY — DX: Major depressive disorder, single episode, unspecified: F32.9

## 2011-06-02 HISTORY — DX: Sleep apnea, unspecified: G47.30

## 2011-06-02 HISTORY — PX: ESOPHAGOGASTRODUODENOSCOPY: SHX5428

## 2011-06-02 HISTORY — PX: BRAVO PH STUDY: SHX5421

## 2011-06-02 HISTORY — DX: Depression, unspecified: F32.A

## 2011-06-02 HISTORY — DX: Anemia, unspecified: D64.9

## 2011-06-02 HISTORY — DX: Cardiac arrhythmia, unspecified: I49.9

## 2011-06-02 SURGERY — EGD (ESOPHAGOGASTRODUODENOSCOPY)
Anesthesia: Moderate Sedation

## 2011-06-02 MED ORDER — GLYCOPYRROLATE 0.2 MG/ML IJ SOLN
INTRAMUSCULAR | Status: DC | PRN
  Administered 2011-06-02: 0.2 mg via INTRAVENOUS

## 2011-06-02 MED ORDER — GLYCOPYRROLATE 0.2 MG/ML IJ SOLN
INTRAMUSCULAR | Status: AC
  Filled 2011-06-02: qty 1

## 2011-06-02 MED ORDER — FENTANYL NICU IV SYRINGE 50 MCG/ML
INJECTION | INTRAMUSCULAR | Status: DC | PRN
  Administered 2011-06-02 (×2): 25 ug via INTRAVENOUS

## 2011-06-02 MED ORDER — SODIUM CHLORIDE 0.9 % IV SOLN
Freq: Once | INTRAVENOUS | Status: AC
  Administered 2011-06-02: 500 mL via INTRAVENOUS

## 2011-06-02 MED ORDER — MIDAZOLAM HCL 10 MG/2ML IJ SOLN
INTRAMUSCULAR | Status: AC
  Filled 2011-06-02: qty 2

## 2011-06-02 MED ORDER — MIDAZOLAM HCL 5 MG/5ML IJ SOLN
INTRAMUSCULAR | Status: DC | PRN
  Administered 2011-06-02 (×2): 2 mg via INTRAVENOUS

## 2011-06-02 MED ORDER — BUTAMBEN-TETRACAINE-BENZOCAINE 2-2-14 % EX AERO
INHALATION_SPRAY | CUTANEOUS | Status: DC | PRN
  Administered 2011-06-02: 2 via TOPICAL

## 2011-06-02 MED ORDER — FENTANYL CITRATE 0.05 MG/ML IJ SOLN
INTRAMUSCULAR | Status: AC
  Filled 2011-06-02: qty 2

## 2011-06-02 NOTE — Interval H&P Note (Signed)
History and Physical Interval Note:   06/02/2011   12:05 PM   Lisa [name]  has presented today for surgery, with the diagnosis of abdominal pain bloating NEEDS PH PROBE  The various methods of treatment have been discussed with the patient and family. After consideration of risks, benefits and other options for treatment, the patient has consented to  Procedure(s): ESOPHAGOGASTRODUODENOSCOPY (EGD) BRAVO PH STUDY as a surgical intervention .  The patients' history has been reviewed, patient examined, no change in status, stable for surgery.  I have reviewed the patients' chart and labs.  Questions were answered to the patient's satisfaction.     Melvia Heaps  MD

## 2011-06-02 NOTE — H&P (View-Only) (Signed)
HPI: Lisa [name] is a 40 year old Afro-American female for at the request of Dr. Luisa Hart for evaluation of abdominal pain. For several months she's been complaining of diffuse abdominal pain beginning in her upper abdomen and radiating to the lower abdomen. It is a constant aching pain and occasionally sharp pain. It is unaffected by eating or moving her bowels. She suffers from chronic constipation and may only have 2-3 bowel movements a week. She was admitted in the latter part of September with gallstone pancreatitis. She subsequently underwent a laparoscopic cholecystectomy. IOC was negative. Pain has not changed since the surgery. She underwent a CT scan which demonstrated constipation only. She has seen small amounts of blood on the toilet tissue and a  tan stool. She  underwent colonoscopy a year ago where diverticulosis only was seen. She denies nausea.  Her other complaint is hoarseness and pyrosis.  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 She has frequent pyrosis despite taking Pepcid and omeprazole. She denies dysphagia but she has difficulty sometimes initiating the swallow.      Past Medical History  Diagnosis Date  . Trouble swallowing   . Change in voice   . Chest pain   . Abdominal pain   . Blood in stool   . Constipation   . Nausea   . Migraines   . Fatigue   . Gallstones 04/10/2011    with biliary pancreatitis   Past Surgical History  Procedure Date  . Nasal polyp surgery     polyp from vocal cords  . Wisdom tooth extraction   . Laparoscopic cholecystectomy w/ cholangiography 04/25/2011    Dr Jamey Ripa   family history includes Diabetes in her mother; Lung cancer in her father; and Pancreatic cancer in her mother. Current Outpatient Prescriptions  Medication Sig Dispense Refill  . famotidine (PEPCID) 20 MG tablet Take 20 mg by mouth daily.        . methocarbamol (ROBAXIN) 500 MG tablet Take 500 mg by mouth 2 (two) times daily.        Marland Kitchen  omeprazole (PRILOSEC) 20 MG capsule Take 20 mg by mouth 2 (two) times daily.        . Polyethylene Glycol 3350 (MIRALAX PO) Take by mouth as needed.         Allergies as of 05/28/2011  . (No Known Allergies)    reports that she has never smoked. She has never used smokeless tobacco. She reports that she does not drink alcohol or use illicit drugs.     Review of Systems: Pertinent positive and negative review of systems were noted in the above HPI section. All other review of systems were otherwise negative.  Vital signs were reviewed in today's medical record Physical Exam: General: Well developed , well nourished, no acute distress Head: Normocephalic and atraumatic Eyes:  sclerae anicteric, EOMI Ears: Normal auditory acuity Mouth: No deformity or lesions Neck: Supple, no masses or thyromegaly Lungs: Clear throughout to auscultation Heart: Regular rate and rhythm; no murmurs, rubs or bruits Abdomen: Soft,non distended. No masses,; there is mild tenderness to palpation bilaterally in the lower quadrants hepatosplenomegaly or hernias noted. Normal Bowel sounds Rectal:deferred Musculoskeletal: Symmetrical with no gross deformities  Skin: No lesions on visible extremities Pulses:  Normal pulses noted Extremities: No clubbing, cyanosis, edema or deformities noted Neurological: Alert oriented x 4, grossly nonfocal Cervical Nodes:  No significant cervical adenopathy Inguinal Nodes: No significant inguinal adenopathy Psychological:  Alert and cooperative. Normal mood and affect

## 2011-06-02 NOTE — Interval H&P Note (Signed)
History and Physical Interval Note:   06/02/2011   12:28 PM   Lisa [name]  has presented today for surgery, with the diagnosis of abdominal pain bloating NEEDS PH PROBE  The various methods of treatment have been discussed with the patient and family. After consideration of risks, benefits and other options for treatment, the patient has consented to  Procedure(s): ESOPHAGOGASTRODUODENOSCOPY (EGD) BRAVO PH STUDY as a surgical intervention .  The patients' history has been reviewed, patient examined, no change in status, stable for surgery.  I have reviewed the patients' chart and labs.  Questions were answered to the patient's satisfaction.     Melvia Heaps  MD

## 2011-06-02 NOTE — Op Note (Signed)
See report under [facility]

## 2011-06-03 ENCOUNTER — Encounter (HOSPITAL_COMMUNITY): Payer: Self-pay

## 2011-06-03 ENCOUNTER — Telehealth: Payer: Self-pay

## 2011-06-03 NOTE — Telephone Encounter (Signed)
Message copied by Michele Mcalpine on Wed Jun 03, 2011 12:17 PM ------      Message from: Melvia Heaps D      Created: Wed Jun 03, 2011 11:31 AM       Please inform the patient that the stool hemeoccult was normal and to continue current plan of action

## 2011-06-03 NOTE — Progress Notes (Signed)
Quick Note:  Please inform the patient that the stool hemeoccult was normal and to continue current plan of action ______ 

## 2011-06-03 NOTE — Telephone Encounter (Signed)
Pt aware.

## 2011-06-09 ENCOUNTER — Encounter (HOSPITAL_COMMUNITY): Payer: Self-pay | Admitting: Gastroenterology

## 2011-06-11 ENCOUNTER — Telehealth: Payer: Self-pay | Admitting: Gastroenterology

## 2011-06-11 NOTE — Telephone Encounter (Signed)
Pt is calling with several questions:  1) Pt is confused because initially she was given a letter that stated she would return to work 07/08/11, her FMLA papers were faxed back from [employer] yesterday stating she was to return to work 06/10/11  2) Pt wants the results of her Bravo PH study  3) Pt states she is still having pain and wants to know what can be done for her pain  Dr. Arlyce Dice please advise.

## 2011-06-11 NOTE — Telephone Encounter (Signed)
He returned to work date was erroneously entered as December when it should have been November. I have not yet seen the probable pH recording to interpret it so results are not yet available. Issue moving her bowels regularly on lactulose?

## 2011-06-12 ENCOUNTER — Encounter: Payer: Self-pay | Admitting: Gastroenterology

## 2011-06-12 DIAGNOSIS — K219 Gastro-esophageal reflux disease without esophagitis: Secondary | ICD-10-CM

## 2011-06-12 NOTE — Progress Notes (Signed)
Patient ID: Lisa [name], female   DOB: 04-15-1971, 40 y.o.   MRN: [id number] 48 hour bravo pH study was done while the patient continued her PPI medication including omeprazole 20 mg twice a day and Pepcid 20 mg daily. The study demonstrated significant acid reflux during the 48 hour period (see details report)

## 2011-06-12 NOTE — Telephone Encounter (Signed)
Pt scheduled to see Dr. Arlyce Dice 06/16/11@10 :30am. Left message for pt to call back.

## 2011-06-12 NOTE — Telephone Encounter (Signed)
Pt aware.

## 2011-06-12 NOTE — Telephone Encounter (Signed)
Pt states that she is not able to return to work at this time, she is still having to much pain. She states her bowels move for a few days on the lactulose and then they stop.

## 2011-06-12 NOTE — Telephone Encounter (Signed)
ok 

## 2011-06-12 NOTE — Telephone Encounter (Signed)
She should be taking lactulose up to 4x/day.  Needs OV next week

## 2011-06-12 NOTE — Telephone Encounter (Signed)
Result for Bravo [employer] [place] printed off and placed on your desk, located under procedure tab dated 06/10/11.

## 2011-06-13 NOTE — Procedures (Deleted)
[facility]                            NUCLEAR IMAGING STUDY  NAME:Lisa [name]               MRN:          [id number] DATE:06/02/2011                            DOB:          04/29/1971   A 48-hour BRAVO pH study was done while the patient continued on her PPI medications.  On day 1, there were 90 instances of reflux.  Acid pH was less than 4 for 188 minutes and fraction of time below pH 4 was 16.1%.  Total DeMeester score on day 1, was 64.1.  On day 2, 32 episodes of reflux.  Fraction of time the pH below 4 was 8.7.  Total DeMeester score was 26.1.  The patient describes multiple episodes of belching and reflux during the 2 day.  IMPRESSION:  Significant acid reflux while on PPI therapy.  RECOMMENDATIONS:  Fundoplication.    Barbette Hair. Arlyce Dice, MD,FACG    RDK/MedQ  DD: 06/12/2011  DT: 06/13/2011  Job #: [id number]

## 2011-06-16 ENCOUNTER — Ambulatory Visit (INDEPENDENT_AMBULATORY_CARE_PROVIDER_SITE_OTHER): Payer: BC Managed Care – PPO | Admitting: Gastroenterology

## 2011-06-16 ENCOUNTER — Encounter: Payer: Self-pay | Admitting: Gastroenterology

## 2011-06-16 DIAGNOSIS — R1084 Generalized abdominal pain: Secondary | ICD-10-CM

## 2011-06-16 DIAGNOSIS — R109 Unspecified abdominal pain: Secondary | ICD-10-CM

## 2011-06-16 DIAGNOSIS — K219 Gastro-esophageal reflux disease without esophagitis: Secondary | ICD-10-CM

## 2011-06-16 NOTE — Assessment & Plan Note (Addendum)
The patient has significant acid reflux despite PPI therapy.  Recommendations #1 gastric emptying scan #2 to consider fundoplication pending results of #1

## 2011-06-16 NOTE — Patient Instructions (Signed)
Your Gastric Emptying Scan is scheduled on 07/09/2011 at [facility] at 7:30am to arrive No Omeprazole 24 hours before your test You will need to follow up in 1 month with Amy Esterwood or Lucrezia Europe in Dr Marzetta Board absence  Research will be speaking with you today

## 2011-06-16 NOTE — Assessment & Plan Note (Signed)
I suspect pain is related to chronic constipation. She recently has had a GYN exam.  Recommendations #1 continue high dose lactulose #2 patient will consider enrollment in a constipation trial #3 hyomax as needed

## 2011-06-16 NOTE — Progress Notes (Signed)
History of Present Illness:  Lisa [name] has returned for followup of constipation, abdominal pain and reflux. She is taking lactulose daily and is moving her bowels more regularly but still continues to have lower bowel pain and mild constipation. In the last 2 days she has  increased her lactulose to 4 times a day. She claims the pain has improved with hyomax but  is still present.  48 hour bravo pH test demonstrated significant acid reflux on both days. She continues on omeprazole twice a day.    Review of Systems: Pertinent positive and negative review of systems were noted in the above HPI section. All other review of systems were otherwise negative.    Current Medications, Allergies, Past Medical History, Past Surgical History, Family History and Social History were reviewed in [employer] electronic medical record  Vital signs were reviewed in today's medical record. Physical Exam: General: Well developed , well nourished, no acute distress

## 2011-06-22 ENCOUNTER — Encounter (HOSPITAL_COMMUNITY): Payer: Self-pay | Admitting: *Deleted

## 2011-06-22 ENCOUNTER — Emergency Department (HOSPITAL_COMMUNITY)
Admission: EM | Admit: 2011-06-22 | Discharge: 2011-06-22 | Disposition: A | Discharge status: Home / Self Care | Payer: BC Managed Care – PPO | Attending: Emergency Medicine | Admitting: Emergency Medicine

## 2011-06-22 DIAGNOSIS — Z79899 Other long term (current) drug therapy: Secondary | ICD-10-CM | POA: Insufficient documentation

## 2011-06-22 DIAGNOSIS — R110 Nausea: Secondary | ICD-10-CM | POA: Insufficient documentation

## 2011-06-22 DIAGNOSIS — F329 Major depressive disorder, single episode, unspecified: Secondary | ICD-10-CM | POA: Insufficient documentation

## 2011-06-22 DIAGNOSIS — E669 Obesity, unspecified: Secondary | ICD-10-CM | POA: Insufficient documentation

## 2011-06-22 DIAGNOSIS — H53149 Visual discomfort, unspecified: Secondary | ICD-10-CM | POA: Insufficient documentation

## 2011-06-22 DIAGNOSIS — K219 Gastro-esophageal reflux disease without esophagitis: Secondary | ICD-10-CM | POA: Insufficient documentation

## 2011-06-22 DIAGNOSIS — G43909 Migraine, unspecified, not intractable, without status migrainosus: Secondary | ICD-10-CM | POA: Insufficient documentation

## 2011-06-22 DIAGNOSIS — F3289 Other specified depressive episodes: Secondary | ICD-10-CM | POA: Insufficient documentation

## 2011-06-22 MED ORDER — SODIUM CHLORIDE 0.9 % IV BOLUS (SEPSIS)
500.0000 mL | Freq: Once | INTRAVENOUS | Status: AC
  Administered 2011-06-22: 500 mL via INTRAVENOUS

## 2011-06-22 MED ORDER — PROMETHAZINE HCL 25 MG/ML IJ SOLN
25.0000 mg | Freq: Once | INTRAMUSCULAR | Status: AC
  Administered 2011-06-22: 25 mg via INTRAVENOUS
  Filled 2011-06-22: qty 1

## 2011-06-22 MED ORDER — DIPHENHYDRAMINE HCL 50 MG/ML IJ SOLN
25.0000 mg | Freq: Once | INTRAMUSCULAR | Status: AC
  Administered 2011-06-22: 25 mg via INTRAVENOUS
  Filled 2011-06-22: qty 1

## 2011-06-22 NOTE — ED Provider Notes (Signed)
12:10 PM Assumed care of patient in the CDU.  Patient signed out by Dr. Rubin Payor.  Patient with a history of migraines who comes to the ED with a migraine headache. Headache associated with nausea and photophobia. Patient reports that the pain is similar to her typical migraines, but slightly more severe.  Patient given IVF, phenergan, and Benadryl.  Patient reports that her pain is starting to improve at this time.  Patient A & O x 3, VSS, Heart RRR, Lungs CTAB, CN II-XII intact, muscle strength 5/5 bilaterally, normal gait.    12:55 PM Patient reports that her headache has improved and is requesting to be discharged at this time.  Patient alert and orientated x 3.  No actute distress.  CN II-XII intact.  Will discharge patient home with PCP follow up.  Patient instructed to return to the ED if symptoms change or worsen.  Pascal Lux Wingen 06/22/11 1742  Herbert Seta Zenaida Niece Wingen 06/22/11 1743

## 2011-06-22 NOTE — ED Notes (Signed)
To ed for eval of HA last 3 days. Hx of migraines. No local MD for HA treatment. Photophobia and nausea.

## 2011-06-22 NOTE — ED Provider Notes (Signed)
History     CSN: [id number] Arrival date & time: 06/22/2011  9:15 AM   First MD Initiated Contact with Patient 06/22/11 1041      Chief Complaint  Patient presents with  . Headache    (Consider location/radiation/quality/duration/timing/severity/associated sxs/prior treatment) Patient is a 40 y.o. female presenting with headaches. The history is provided by the patient.  Headache  Episode onset: 3 days ago. The problem has been gradually worsening. The headache is associated with bright light. The quality of the pain is described as throbbing. The pain is moderate. The pain does not radiate. Associated symptoms include nausea. Pertinent negatives include no fever, no shortness of breath and no vomiting.   patient states that she's had a migraine headache for the last 3 days. Is a typical migraine, maybe a little more severe. She's also had some photophobia and nausea, which is typical for her. No fevers. No relief with her Robaxin at home. She had a cholecystectomy a couple months ago, has not helped her chronic abdominal pain. She's also previously had pancreatitis probably from biliary origin. He states this is stable for her. No localized numbness or weakness. No trauma to her head. She denies possibility of pregnancy.  Past Medical History  Diagnosis Date  . Trouble swallowing   . Change in voice   . Chest pain   . Abdominal pain   . Blood in stool   . Constipation   . Nausea   . Migraines   . Fatigue   . Gallstones 04/10/2011    with biliary pancreatitis  . Dysrhythmia   . Sleep apnea     cpap  . GERD (gastroesophageal reflux disease)   . Anemia     hx of anemic  . Depression     Past Surgical History  Procedure Date  . Nasal polyp surgery     polyp from vocal cords  . Wisdom tooth extraction   . Laparoscopic cholecystectomy w/ cholangiography 04/25/2011    Dr Jamey Ripa  . Cholecystectomy   . Dilation and curettage of uterus   . Esophagogastroduodenoscopy  06/02/2011    Procedure: ESOPHAGOGASTRODUODENOSCOPY (EGD);  Surgeon: Louis Meckel, MD;  Location: [facility];  Service: Endoscopy;  Laterality: N/A;  . Bravo ph study 06/02/2011    Procedure: BRAVO PH STUDY;  Surgeon: Louis Meckel, MD;  Location: [facility];  Service: Endoscopy;  Laterality: N/A;    Family History  Problem Relation Age of Onset  . Diabetes Mother   . Pancreatic cancer Mother   . Lung cancer Father   . Anesthesia problems Neg Hx   . Hypotension Neg Hx   . Malignant hyperthermia Neg Hx   . Pseudochol deficiency Neg Hx     History  Substance Use Topics  . Smoking status: Never Smoker   . Smokeless tobacco: Never Used  . Alcohol Use: No    OB History    Grav Para Term Preterm Abortions TAB SAB Ect Mult Living                  Review of Systems  Constitutional: Negative for fever, activity change and appetite change.  HENT: Negative for neck stiffness.   Eyes: Positive for photophobia. Negative for pain.  Respiratory: Negative for chest tightness and shortness of breath.   Cardiovascular: Negative for chest pain and leg swelling.  Gastrointestinal: Positive for nausea and abdominal pain. Negative for vomiting and diarrhea.  Genitourinary: Negative for flank pain.  Musculoskeletal: Negative for back pain.  Skin: Negative for rash.  Neurological: Positive for headaches. Negative for weakness and numbness.  Psychiatric/Behavioral: Negative for behavioral problems.    Allergies  Review of patient's allergies indicates no known allergies.  Home Medications   Current Outpatient Rx  Name Route Sig Dispense Refill  . HYOSCYAMINE SULFATE 0.375 MG PO TBCR  Take one tablet twice a day for abdominal pain and 60 each 1  . LACTULOSE 10 GM/15ML PO SOLN  Take 1-2 tablespoons (15cc) 1-2 times a day 240 mL 2  . METHOCARBAMOL 500 MG PO TABS Oral Take 500 mg by mouth 2 (two) times daily. For pain    . OMEPRAZOLE 20 MG PO CPDR Oral Take 40 mg by mouth 2 (two)  times daily.     Marland Kitchen TIZANIDINE HCL 4 MG PO CAPS Oral Take 4-12 mg by mouth 3 (three) times daily as needed. For headaches       BP 117/84  Pulse 63  Temp(Src) 98.1 F (36.7 C) (Oral)  Resp 16  SpO2 100%  LMP 05/11/2011  Physical Exam  Nursing note and vitals reviewed. Constitutional: She is oriented to person, place, and time. She appears well-developed and well-nourished.       Patient is obese  HENT:  Head: Normocephalic and atraumatic.  Eyes: Right eye exhibits no discharge. Left eye exhibits no discharge.       Patient is wearing sunglasses. No focal findings under her glasses  Neck: Normal range of motion. Neck supple.  Cardiovascular: Normal rate, regular rhythm and normal heart sounds.   No murmur heard. Pulmonary/Chest: Effort normal and breath sounds normal. No respiratory distress. She has no wheezes. She has no rales.  Abdominal: Soft. Bowel sounds are normal. She exhibits no distension. There is no tenderness. There is no rebound and no guarding.  Musculoskeletal: Normal range of motion.  Neurological: She is alert and oriented to person, place, and time. No cranial nerve deficit.  Skin: Skin is warm and dry.  Psychiatric: She has a normal mood and affect. Her speech is normal.    ED Course  Procedures (including critical care time)  Labs Reviewed - No data to display No results found.   1. Migraine       MDM  Headaches history of migraines. Typical headache. Patient's in the CDU is a holding patient for treatment.        Juliet Rude. Rubin Payor, MD 06/23/11 [phone]

## 2011-06-22 NOTE — ED Notes (Signed)
Pt has ambulated to the restroom. Gait steady

## 2011-06-23 NOTE — ED Provider Notes (Signed)
Medical screening examination/treatment/procedure(s) were performed by non-physician practitioner and as supervising physician I was immediately available for consultation/collaboration.  Kalif Kattner R. Omkar Stratmann, MD 06/23/11 0701 

## 2011-07-09 ENCOUNTER — Other Ambulatory Visit (HOSPITAL_COMMUNITY): Payer: BC Managed Care – PPO

## 2011-07-16 NOTE — Op Note (Signed)
[facility]                               PROCEDURE NOTE  NAME:Lisa [name], Lisa [name]               MRN:          [id number] DATE:06/02/2011                            DOB:          1970/07/20   A 48-hour BRAVO pH study was done while the patient continued on her PPI medications.  On day 1, there were 90 instances of reflux.  Acid pH was less than 4 for 188 minutes and fraction of time below pH 4 was 16.1%.  Total DeMeester score on day 1, was 64.1.  On day 2, there were 82 episodes of reflux.  Fraction of time the pH below 4 was 8.7.  Total DeMeester score was 26.1.  The patient describes multiple episodes of belching and reflux during the 2 days.  IMPRESSION:  Significant acid reflux while on PPI therapy.  RECOMMENDATIONS:  Fundoplication.    Barbette Hair. Arlyce Dice, MD,FACG    RDK/MedQ  DD: 06/12/2011  DT: 06/13/2011  Job #: [id number]

## 2011-07-31 ENCOUNTER — Other Ambulatory Visit (HOSPITAL_COMMUNITY): Payer: BC Managed Care – PPO

## 2011-08-14 ENCOUNTER — Encounter: Payer: Self-pay | Admitting: Gastroenterology

## 2011-08-28 ENCOUNTER — Encounter (HOSPITAL_COMMUNITY)
Admission: RE | Admit: 2011-08-28 | Discharge: 2011-08-28 | Disposition: A | Discharge status: Home / Self Care | Payer: BC Managed Care – PPO | Source: Ambulatory Visit | Attending: Gastroenterology | Admitting: Gastroenterology

## 2011-08-28 DIAGNOSIS — R141 Gas pain: Secondary | ICD-10-CM | POA: Insufficient documentation

## 2011-08-28 DIAGNOSIS — R142 Eructation: Secondary | ICD-10-CM | POA: Insufficient documentation

## 2011-08-28 DIAGNOSIS — R109 Unspecified abdominal pain: Secondary | ICD-10-CM | POA: Insufficient documentation

## 2011-08-28 DIAGNOSIS — K219 Gastro-esophageal reflux disease without esophagitis: Secondary | ICD-10-CM | POA: Insufficient documentation

## 2011-08-28 DIAGNOSIS — R6881 Early satiety: Secondary | ICD-10-CM | POA: Insufficient documentation

## 2011-08-28 MED ORDER — TECHNETIUM TC 99M SULFUR COLLOID
2.1000 | Freq: Once | INTRAVENOUS | Status: AC | PRN
  Administered 2011-08-28: 2.1 via INTRAVENOUS

## 2011-09-18 ENCOUNTER — Encounter: Payer: Self-pay | Admitting: Gastroenterology

## 2011-09-18 ENCOUNTER — Ambulatory Visit (INDEPENDENT_AMBULATORY_CARE_PROVIDER_SITE_OTHER): Payer: BC Managed Care – PPO | Admitting: Gastroenterology

## 2011-09-18 DIAGNOSIS — K219 Gastro-esophageal reflux disease without esophagitis: Secondary | ICD-10-CM

## 2011-09-18 DIAGNOSIS — R1084 Generalized abdominal pain: Secondary | ICD-10-CM

## 2011-09-18 NOTE — Assessment & Plan Note (Signed)
Abdominal pain is probably related to constipation. Lactulose has been unsuccessful at alleviating the latter.  Recommendations #1 trial of the Linzess 290 micrograms daily

## 2011-09-18 NOTE — Assessment & Plan Note (Addendum)
She continues to be symptomatic despite high-dose PPI therapy. GES is normal.  Constipation and obstipation may be contribute.  Recommendations #1 reevaluate after one week of Linzess.  If not improved she will try dexilant 60 mg twice a day. Finally, if symptoms remain high would consider a fundoplication.

## 2011-09-18 NOTE — Patient Instructions (Signed)
We are giving you Dexilant samples today Take one capsule twice a day for 7 days Call back after the 7 days and let us how you doing We are also giving you samples of Linzess Take one by mouth once daily 30 minutes before breakfast

## 2011-09-18 NOTE — Progress Notes (Signed)
History of Present Illness:  Lisa [name] has returned for followup of reflux and constipation. Gastric empty scan was normal. On twice a day omeprazole she continues to have significant pyrosis with throat burning and hoarseness. Lactulose has been unsuccessful at alleviating her constipation and abdominal discomfort.    Review of Systems: Pertinent positive and negative review of systems were noted in the above HPI section. All other review of systems were otherwise negative.    Current Medications, Allergies, Past Medical History, Past Surgical History, Family History and Social History were reviewed in [employer] electronic medical record  Vital signs were reviewed in today's medical record. Physical Exam: General: Well developed , well nourished, no acute distress

## 2011-09-29 ENCOUNTER — Telehealth: Payer: Self-pay | Admitting: Gastroenterology

## 2011-09-29 DIAGNOSIS — K219 Gastro-esophageal reflux disease without esophagitis: Secondary | ICD-10-CM

## 2011-09-29 MED ORDER — LINACLOTIDE 290 MCG PO CAPS: 1.0000 | ORAL_CAPSULE | Freq: Every day | ORAL | Status: DC

## 2011-09-29 NOTE — Telephone Encounter (Signed)
Notified pt I will order Linzess from [facility] and that Dr Arlyce Dice would like her to see Dr Wenda Low for a possible Fundoplication. Mailed pt information on the procedure.

## 2011-09-29 NOTE — Telephone Encounter (Signed)
Pt called to give Dr. Arlyce Dice an update. States she is still having acid reflux even taking the Dexilant twice a day. The Linzess is working for the constipation. Pt states she may need surgery for the acid reflux. Dr. Arlyce Dice please advise.

## 2011-09-29 NOTE — Telephone Encounter (Signed)
Please prescribe linzess 240 micrograms daily.  Refer her to Dr. Rolan Bucco for consideration of fundoplication.

## 2011-10-01 ENCOUNTER — Other Ambulatory Visit: Payer: Self-pay | Admitting: Gastroenterology

## 2011-10-01 DIAGNOSIS — K219 Gastro-esophageal reflux disease without esophagitis: Secondary | ICD-10-CM

## 2011-10-01 NOTE — Telephone Encounter (Signed)
Yes  ----- Message ----- From: Lily Lovings, RN Sent: 09/30/2011 9:36 AM To: Louis Meckel, MD Subject: FW: Referral   Dr. Arlyce Dice,  The pt needs to have an upper GI prior to being seen by Dr. Daphine Deutscher for surgical referral. Is it ok to go ahead and order this?   Thanks, Linda  Pt scheduled for UGI at [facility] 09/12/11 arrival time 9:15am for a 9:30am appt. Pt to be NPO after midnight. Pt aware of UGI appt date and time. Waiting to hear back from [facility] for appt with Dr. Daphine Deutscher.

## 2011-10-01 NOTE — Telephone Encounter (Signed)
Pt scheduled to see Dr. Wenda Low 11/22/11 arrival time 8:50am for a 9:20am appt. Left message for pt to call back.

## 2011-10-02 NOTE — Telephone Encounter (Signed)
Spoke with pt and she is aware of the appt date and time.

## 2011-10-08 ENCOUNTER — Ambulatory Visit (HOSPITAL_COMMUNITY)
Admission: RE | Admit: 2011-10-08 | Discharge: 2011-10-08 | Disposition: A | Discharge status: Home / Self Care | Payer: BC Managed Care – PPO | Source: Ambulatory Visit | Attending: Gastroenterology | Admitting: Gastroenterology

## 2011-10-08 DIAGNOSIS — R1310 Dysphagia, unspecified: Secondary | ICD-10-CM | POA: Insufficient documentation

## 2011-10-08 DIAGNOSIS — K219 Gastro-esophageal reflux disease without esophagitis: Secondary | ICD-10-CM | POA: Insufficient documentation

## 2011-11-11 ENCOUNTER — Emergency Department (HOSPITAL_COMMUNITY): Payer: Self-pay

## 2011-11-11 ENCOUNTER — Encounter (HOSPITAL_COMMUNITY): Payer: Self-pay | Admitting: Emergency Medicine

## 2011-11-11 ENCOUNTER — Emergency Department (HOSPITAL_COMMUNITY)
Admission: EM | Admit: 2011-11-11 | Discharge: 2011-11-11 | Disposition: A | Discharge status: Home / Self Care | Payer: Self-pay | Attending: Emergency Medicine | Admitting: Emergency Medicine

## 2011-11-11 DIAGNOSIS — Z79899 Other long term (current) drug therapy: Secondary | ICD-10-CM | POA: Insufficient documentation

## 2011-11-11 DIAGNOSIS — T50995A Adverse effect of other drugs, medicaments and biological substances, initial encounter: Secondary | ICD-10-CM | POA: Insufficient documentation

## 2011-11-11 DIAGNOSIS — R51 Headache: Secondary | ICD-10-CM | POA: Insufficient documentation

## 2011-11-11 DIAGNOSIS — K219 Gastro-esophageal reflux disease without esophagitis: Secondary | ICD-10-CM | POA: Insufficient documentation

## 2011-11-11 DIAGNOSIS — F3289 Other specified depressive episodes: Secondary | ICD-10-CM | POA: Insufficient documentation

## 2011-11-11 DIAGNOSIS — T50905A Adverse effect of unspecified drugs, medicaments and biological substances, initial encounter: Secondary | ICD-10-CM

## 2011-11-11 DIAGNOSIS — R221 Localized swelling, mass and lump, neck: Secondary | ICD-10-CM | POA: Insufficient documentation

## 2011-11-11 DIAGNOSIS — F329 Major depressive disorder, single episode, unspecified: Secondary | ICD-10-CM | POA: Insufficient documentation

## 2011-11-11 DIAGNOSIS — R4789 Other speech disturbances: Secondary | ICD-10-CM | POA: Insufficient documentation

## 2011-11-11 DIAGNOSIS — H538 Other visual disturbances: Secondary | ICD-10-CM | POA: Insufficient documentation

## 2011-11-11 DIAGNOSIS — R209 Unspecified disturbances of skin sensation: Secondary | ICD-10-CM | POA: Insufficient documentation

## 2011-11-11 DIAGNOSIS — R220 Localized swelling, mass and lump, head: Secondary | ICD-10-CM | POA: Insufficient documentation

## 2011-11-11 MED ORDER — DIPHENHYDRAMINE HCL 50 MG/ML IJ SOLN
25.0000 mg | Freq: Once | INTRAMUSCULAR | Status: AC
  Administered 2011-11-11: 25 mg via INTRAVENOUS
  Filled 2011-11-11: qty 1

## 2011-11-11 MED ORDER — METHYLPREDNISOLONE SODIUM SUCC 125 MG IJ SOLR
125.0000 mg | Freq: Once | INTRAMUSCULAR | Status: AC
  Administered 2011-11-11: 125 mg via INTRAVENOUS
  Filled 2011-11-11: qty 2

## 2011-11-11 MED ORDER — FAMOTIDINE IN NACL 20-0.9 MG/50ML-% IV SOLN
20.0000 mg | Freq: Once | INTRAVENOUS | Status: AC
  Administered 2011-11-11: 20 mg via INTRAVENOUS
  Filled 2011-11-11: qty 50

## 2011-11-11 MED ORDER — LORAZEPAM 2 MG/ML IJ SOLN
1.0000 mg | Freq: Once | INTRAMUSCULAR | Status: AC
  Administered 2011-11-11: 1 mg via INTRAVENOUS
  Filled 2011-11-11: qty 1

## 2011-11-11 MED ORDER — METOCLOPRAMIDE HCL 5 MG/ML IJ SOLN
10.0000 mg | Freq: Once | INTRAMUSCULAR | Status: AC
  Administered 2011-11-11: 10 mg via INTRAVENOUS
  Filled 2011-11-11: qty 2

## 2011-11-11 MED ORDER — SODIUM CHLORIDE 0.9 % IV BOLUS (SEPSIS)
1000.0000 mL | Freq: Once | INTRAVENOUS | Status: AC
  Administered 2011-11-11: 1000 mL via INTRAVENOUS

## 2011-11-11 NOTE — ED Notes (Signed)
Pt states that she started taking abilify and bupropion one week ago.  Pt states that she is having an allergic reaction, complaining of facial ticks, tongue swelling, left sided facial numbness.  Pt spoke with MD who prescribed it and told to stop taking abilify but pt continues to have symptoms; however, pt is still taking bupropion.  Pt able to speak in complete sentences.  Pt also on bupropion and was informed the side effects are the same as the abilify so she is unsure which medication it is.  Pt states speech is slurred; speech slightly garbled.  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  Pt concerned of airway becoming obstructed.  Pt moving air at 99% O2 level.   No facial ticks noted. Pt not in acute distress.

## 2011-11-11 NOTE — ED Notes (Signed)
PT STILL AT [facility]

## 2011-11-11 NOTE — ED Notes (Signed)
TRANSPORTED TO [facility]

## 2011-11-11 NOTE — Discharge Instructions (Signed)

## 2011-11-11 NOTE — ED Provider Notes (Signed)
History   This chart was scribed for Cyndra Numbers, MD by Melba Coon. The patient was seen in room STRE8/STRE8 and the patient's care was started at 7:36PM.    CSN: [id number]  Arrival date & time 11/11/11  1750   None     Chief Complaint  Patient presents with  . Allergic Reaction    (Consider location/radiation/quality/duration/timing/severity/associated sxs/prior treatment) HPI NYLEAH MCGINNIS is a 41 y.o. female who presents to the Emergency Department complaining of persistent, moderate to severe left sided facial numbness with an onset 5 days ago pertaining to an allergic reaction. Pt states that she started taking abilify and bupropion 7 days ago, which is what is causing the allergic reaction. Pt also c/o tongue ticks and swelling, HA of severity 5/10, slurred speech, throat tightness (pt concerned of obstruction of airway), left eye blurriness, and trouble keeping the left eye open. Symptoms have gotten progressively worse. Pt has stopped taking the medications when MD who prescribed it told her to stop; pt then decided to still take bupropion. Pt able to speak in complete sentences. No fever, dizziness, neck pain, sore throat, rash, back pain, CP, SOB, abd pain, n/v/d, dysuria, or extremity pain, edema, weakness, numbness, or tingling. Hx of migraines. No family Hx of strokes. No known allergies. No other pertinent medical symptoms.  No PCP (Dr. Deidre Ala in [facility] follow throat problems)   Past Medical History  Diagnosis Date  . Trouble swallowing   . Change in voice   . Chest pain   . Abdominal pain   . Blood in stool   . Constipation   . Nausea   . Migraines   . Fatigue   . Gallstones 04/10/2011    with biliary pancreatitis  . Dysrhythmia   . Sleep apnea     cpap  . GERD (gastroesophageal reflux disease)   . Anemia     hx of anemic  . Depression     Past Surgical History  Procedure Date  . Nasal polyp surgery     polyp from vocal cords  .  Wisdom tooth extraction   . Laparoscopic cholecystectomy w/ cholangiography 04/25/2011    Dr Jamey Ripa  . Cholecystectomy   . Dilation and curettage of uterus   . Esophagogastroduodenoscopy 06/02/2011    Procedure: ESOPHAGOGASTRODUODENOSCOPY (EGD);  Surgeon: Louis Meckel, MD;  Location: [facility];  Service: Endoscopy;  Laterality: N/A;  . Bravo ph study 06/02/2011    Procedure: BRAVO PH STUDY;  Surgeon: Louis Meckel, MD;  Location: [facility];  Service: Endoscopy;  Laterality: N/A;    Family History  Problem Relation Age of Onset  . Diabetes Mother   . Pancreatic cancer Mother   . Lung cancer Father   . Anesthesia problems Neg Hx   . Hypotension Neg Hx   . Malignant hyperthermia Neg Hx   . Pseudochol deficiency Neg Hx     History  Substance Use Topics  . Smoking status: Never Smoker   . Smokeless tobacco: Never Used  . Alcohol Use: No    OB History    Grav Para Term Preterm Abortions TAB SAB Ect Mult Living                  Review of Systems 10 Systems reviewed and all are negative for acute change except as noted in the HPI.   Allergies  Review of patient's allergies indicates no known allergies.  Home Medications   Current Outpatient Rx  Name Route Sig  Dispense Refill  . ARIPIPRAZOLE 10 MG PO TABS Oral Take 10 mg by mouth daily.    . BUPROPION HCL ER (SR) 150 MG PO TB12 Oral Take 150 mg by mouth daily.    Marland Kitchen METHOCARBAMOL 500 MG PO TABS Oral Take 500 mg by mouth 2 (two) times daily. For pain    . ADULT MULTIVITAMIN W/MINERALS CH Oral Take 1 tablet by mouth daily.    Marland Kitchen OMEPRAZOLE 20 MG PO CPDR Oral Take 40 mg by mouth 2 (two) times daily.       BP 122/60  Pulse 68  Temp(Src) 98.4 F (36.9 C) (Oral)  Resp 18  SpO2 98%  LMP 11/02/2011  Physical Exam  Nursing note and vitals reviewed. Constitutional: She is oriented to person, place, and time. She appears well-developed and well-nourished. No distress.  HENT:  Head: Normocephalic and atraumatic.         No sublingual swelling; no significant gross swelling of the tongue  Eyes: EOM are normal. Pupils are equal, round, and reactive to light.  Neck: Normal range of motion. Neck supple. No tracheal deviation present.  Cardiovascular: Normal rate, regular rhythm and normal heart sounds.   No murmur heard. Pulmonary/Chest: Effort normal. No respiratory distress. She has no wheezes. She has no rales.  Abdominal: Soft. There is no tenderness.  Musculoskeletal: Normal range of motion. She exhibits no tenderness.  Lymphadenopathy:    She has no cervical adenopathy.  Neurological: She is alert and oriented to person, place, and time.       No facial droop; decreased sensation of entire left side of face; no difficulty with raising eyebrows bilaterally; speaks with an intermittent lisp that improves when more animated, Patient is able to read font that is <1 cm in height without difficulty with the left eye at a distance of 3 feet.    Skin: Skin is warm and dry.  Psychiatric: She has a normal mood and affect. Her behavior is normal.    ED Course  Procedures (including critical care time)  DIAGNOSTIC STUDIES: Oxygen Saturation is 99% on room air, normal by my interpretation.    COORDINATION OF CARE:  7:45PM - EDMD will order IV fluids, pain meds, regulam, zofran, benadryl, prednisone, and pepcid for the pt; EDMD is thinking towards complex migraine and, less likely, stroke   Labs Reviewed - No data to display Ct Head Wo Contrast  11/11/2011  *RADIOLOGY REPORT*  Clinical Data: Left-sided facial numbness and weakness, dysarthria  CT HEAD WITHOUT CONTRAST  Technique:  Contiguous axial images were obtained from the base of the skull through the vertex without contrast.  Comparison: 12/09/2008  Findings:  Wallace Cullens white differentiation is maintained.  No CT evidence of acute large territory infarct.  No intraparenchymal or extra-axial mass or hemorrhage.  Unchanged size and configuration of the  ventricles and basilar cisterns.  No midline shift. The paranasal sinuses and mastoid air cells are normal.  Regional soft tissues are normal. No displaced calvarial fracture.  IMPRESSION: Negative noncontrast head CT.  Original Report Authenticated By: Waynard Reeds, M.D.   Mr Brain Wo Contrast  11/11/2011  *RADIOLOGY REPORT*  Clinical Data: Left-sided facial numbness.  Dysarthria.  MRI HEAD WITHOUT CONTRAST  Technique:  Multiplanar, multiecho pulse sequences of the brain and surrounding structures were obtained according to standard protocol without intravenous contrast.  Comparison: Head CT same day  Findings: The brain has a normal appearance on all pulse sequences without evidence of malformation, old or acute  infarction, mass lesion, hemorrhage, hydrocephalus or extra-axial collection.  The pituitary gland is normal.  Sinuses, middle ears and mastoids are clear.  No skull or skull base abnormality.  IMPRESSION: Normal MRI of the brain  Original Report Authenticated By: Thomasenia Sales, M.D.     1. Medication reaction       MDM  The patient was evaluated by myself. Based upon evaluation I was not concerned for anaphylaxis. Patient had symptoms present for least 5 days. This was true of her neurologic symptoms as well. Patient had no history of stroke and was not hypertensive on presentation. Any chest pain or shortness of breath. Patient was treated for migraine as she reported having developed a headache. After Reglan and Benadryl as well as IV fluids patient was feeling much improved CT of the head was performed as patient had persistent neurologic symptoms. This was negative. Patient had not had resolution of her symptoms and also did not have clear presentation consistent with a more benign cause such as Bell's palsy. I discussed the patient with Dr. Roseanne Reno. Call for neurology. He recommended MRI. This was performed.  Remarkable. Patient had no headache but her neurologic symptoms are so  present. Again with Dr. Roseanne Reno who agreed with my assessment this is not likely a central nervous system process. Patient I discussed this. I think this may be a medication reaction to her new Abilify but that it does not fall in the vein of an allergic reaction. Patient has been breathing easily with stable vital signs throughout her time in the emergency department. She was discharged in good condition. She was told that she can followup with [facility] as previously planned.  I personally performed the services described in this documentation, which was scribed in my presence. The recorded information has been reviewed and considered.          Cyndra Numbers, MD 11/11/11 [phone]

## 2011-11-12 ENCOUNTER — Encounter (INDEPENDENT_AMBULATORY_CARE_PROVIDER_SITE_OTHER): Payer: BC Managed Care – PPO | Admitting: Surgery

## 2011-11-17 ENCOUNTER — Emergency Department (HOSPITAL_COMMUNITY)
Admission: EM | Admit: 2011-11-17 | Discharge: 2011-11-17 | Disposition: A | Discharge status: Home / Self Care | Payer: Self-pay | Attending: Emergency Medicine | Admitting: Emergency Medicine

## 2011-11-17 ENCOUNTER — Encounter (INDEPENDENT_AMBULATORY_CARE_PROVIDER_SITE_OTHER): Payer: Self-pay | Admitting: Surgery

## 2011-11-17 ENCOUNTER — Encounter (HOSPITAL_COMMUNITY): Payer: Self-pay | Admitting: *Deleted

## 2011-11-17 DIAGNOSIS — R209 Unspecified disturbances of skin sensation: Secondary | ICD-10-CM | POA: Insufficient documentation

## 2011-11-17 DIAGNOSIS — G43909 Migraine, unspecified, not intractable, without status migrainosus: Secondary | ICD-10-CM | POA: Insufficient documentation

## 2011-11-17 DIAGNOSIS — R200 Anesthesia of skin: Secondary | ICD-10-CM

## 2011-11-17 DIAGNOSIS — K219 Gastro-esophageal reflux disease without esophagitis: Secondary | ICD-10-CM | POA: Insufficient documentation

## 2011-11-17 DIAGNOSIS — E669 Obesity, unspecified: Secondary | ICD-10-CM | POA: Insufficient documentation

## 2011-11-17 LAB — BASIC METABOLIC PANEL
BUN: 15 mg/dL (ref 6–23)
Chloride: 105 mEq/L (ref 96–112)
GFR calc Af Amer: >90 mL/min (ref >90)
Potassium: 4 mEq/L (ref 3.5–5.1)
Sodium: 137 mEq/L (ref 135–145)

## 2011-11-17 LAB — CBC
HCT: 36 % (ref 36.0–46.0)
RDW: 14.6 % (ref 11.5–15.5)
WBC: 9 10*3/uL (ref 4.0–10.5)

## 2011-11-17 LAB — POCT PREGNANCY, URINE: Preg Test, Ur: NEGATIVE

## 2011-11-17 MED ORDER — OXYCODONE-ACETAMINOPHEN 5-325 MG PO TABS
1.0000 | ORAL_TABLET | Freq: Once | ORAL | Status: AC
  Administered 2011-11-17: 1 via ORAL
  Filled 2011-11-17: qty 1

## 2011-11-17 NOTE — ED Notes (Signed)
Pt was received to RM 25 with c/o persistent numbness and swelling to the tongue. Pt was seen here a week ago for the same problem but is not better. Pt denies any dizziness, no SOB, no blurry vision. Pt was attached to the monitor.

## 2011-11-17 NOTE — ED Notes (Signed)
Pt given discharge and follow upinstructions without further questions after speaking with MD. Denies further needs at this time

## 2011-11-17 NOTE — ED Notes (Signed)
Came in last week with swelling of tongue and migraine and was told to come back if symptoms continue.  Pt still with pain to base of neck to base of back, left face and tongue swelling and reports numbness

## 2011-11-17 NOTE — ED Notes (Signed)
Pt c/o left sided numbness and tingling x 2 weeks. Reports being evaluated for same 1 week ago and having MRI with no diagnosis. Pt denies visual changes at this time. Ambulates to restroom in NAD. On cardiac monitor. Pt resting. Denies further needs at this time.

## 2011-11-17 NOTE — ED Provider Notes (Signed)
History     CSN: [id number]  Arrival date & time 11/17/11  0940   First MD Initiated Contact with Patient 11/17/11 [phone]     Chief complaint: Persistent numbness   HPI Patient presents to the emergency room with several complaints. Patient states for over the last week she has been having trouble with migraine headaches, and sensation of swelling in her tongue, numbness to her face and tongue, and pain in her neck and head. Patient states the numbness is in her face and tongue.  The patient denies any dizziness, shortness of breath or blurred vision. She has not any trouble with her strength in her hands or legs. She has not had trouble with ataxia. The patient was seen in the emergency room back on May 1. She started taking Abilify and bupropion 7 days prior to that visit. The patient did stop those medications however the symptoms persisted. She was seen in the emergency room and had an MRI of the brain that was normal. Patient came back to the emergency room today because her symptoms have not resolved. She is also now complaining of aching all over and wonders whether she might have fibromyalgia. Past Medical History  Diagnosis Date  . Trouble swallowing   . Change in voice   . Chest pain   . Abdominal pain   . Blood in stool   . Constipation   . Nausea   . Migraines   . Fatigue   . Gallstones 04/10/2011    with biliary pancreatitis  . Dysrhythmia   . Sleep apnea     cpap  . GERD (gastroesophageal reflux disease)   . Anemia     hx of anemic  . Depression     Past Surgical History  Procedure Date  . Nasal polyp surgery     polyp from vocal cords  . Wisdom tooth extraction   . Laparoscopic cholecystectomy w/ cholangiography 04/25/2011    Dr Jamey Ripa  . Cholecystectomy   . Dilation and curettage of uterus   . Esophagogastroduodenoscopy 06/02/2011    Procedure: ESOPHAGOGASTRODUODENOSCOPY (EGD);  Surgeon: Louis Meckel, MD;  Location: [facility];  Service: Endoscopy;   Laterality: N/A;  . Bravo ph study 06/02/2011    Procedure: BRAVO PH STUDY;  Surgeon: Louis Meckel, MD;  Location: [facility];  Service: Endoscopy;  Laterality: N/A;    Family History  Problem Relation Age of Onset  . Diabetes Mother   . Pancreatic cancer Mother   . Lung cancer Father   . Anesthesia problems Neg Hx   . Hypotension Neg Hx   . Malignant hyperthermia Neg Hx   . Pseudochol deficiency Neg Hx     History  Substance Use Topics  . Smoking status: Never Smoker   . Smokeless tobacco: Never Used  . Alcohol Use: No    OB History    Grav Para Term Preterm Abortions TAB SAB Ect Mult Living                  Review of Systems  All other systems reviewed and are negative.    Allergies  Review of patient's allergies indicates no known allergies.  Home Medications   Current Outpatient Rx  Name Route Sig Dispense Refill  . BUPROPION HCL ER (SR) 150 MG PO TB12 Oral Take 150 mg by mouth daily.    Marland Kitchen METHOCARBAMOL 500 MG PO TABS Oral Take 500 mg by mouth 2 (two) times daily. For pain    .  ADULT MULTIVITAMIN W/MINERALS CH Oral Take 1 tablet by mouth daily.    Marland Kitchen OMEPRAZOLE 20 MG PO CPDR Oral Take 40 mg by mouth 2 (two) times daily.     . QUETIAPINE FUMARATE 200 MG PO TABS Oral Take 200 mg by mouth at bedtime.      BP 134/45  Pulse 78  Temp(Src) 98.4 F (36.9 C) (Oral)  Resp 20  SpO2 99%  LMP 11/02/2011  Physical Exam  Nursing note and vitals reviewed. Constitutional: She is oriented to person, place, and time. She appears well-developed and well-nourished. No distress.       Obese  HENT:  Head: Normocephalic and atraumatic.  Right Ear: External ear normal.  Left Ear: External ear normal.  Mouth/Throat: Oropharynx is clear and moist.  Eyes: Conjunctivae are normal. Right eye exhibits no discharge. Left eye exhibits no discharge. No scleral icterus.  Neck: Neck supple. No tracheal deviation present.  Cardiovascular: Normal rate, regular rhythm and intact  distal pulses.   Pulmonary/Chest: Effort normal and breath sounds normal. No stridor. No respiratory distress. She has no wheezes. She has no rales.  Abdominal: Soft. Bowel sounds are normal. She exhibits no distension. There is no tenderness. There is no rebound and no guarding.  Musculoskeletal: She exhibits no edema and no tenderness.  Neurological: She is alert and oriented to person, place, and time. She has normal strength. No sensory deficit. Cranial nerve deficit:  no gross defecits noted. She exhibits normal muscle tone. She displays no seizure activity. Coordination normal.       No pronator drift bilateral upper extrem, able to hold both legs off bed for 5 seconds, sensation intact in all extremities, no visual field cuts, no left or right sided neglect  Skin: Skin is warm and dry. No rash noted.  Psychiatric: She has a normal mood and affect.    ED Course  Procedures (including critical care time)   Labs Reviewed  CBC  BASIC METABOLIC PANEL  POCT PREGNANCY, URINE   No results found.   1. Numbness       MDM  I reviewed the MRI findings from the last visit. There was no evidence of neurologic abnormality noted on the MRI. Patient has no acute electrolyte abnormalities at this time. I am uncertain as to the etiology of her symptoms but do not feel there is evidence of an acute emergency medical condition. I recommend she followup with her primary doctor or consider seeing a neurologist for further evaluation.        Celene Kras, MD 11/17/11 [phone]

## 2011-11-30 ENCOUNTER — Ambulatory Visit: Payer: BC Managed Care – PPO | Admitting: Family Medicine

## 2011-12-04 ENCOUNTER — Encounter (HOSPITAL_COMMUNITY): Payer: Self-pay | Admitting: *Deleted

## 2011-12-04 ENCOUNTER — Emergency Department (HOSPITAL_COMMUNITY)
Admission: EM | Admit: 2011-12-04 | Discharge: 2011-12-05 | Disposition: A | Discharge status: Home / Self Care | Payer: Self-pay | Attending: Emergency Medicine | Admitting: Emergency Medicine

## 2011-12-04 DIAGNOSIS — IMO0002 Reserved for concepts with insufficient information to code with codable children: Secondary | ICD-10-CM | POA: Insufficient documentation

## 2011-12-04 DIAGNOSIS — L0291 Cutaneous abscess, unspecified: Secondary | ICD-10-CM

## 2011-12-04 NOTE — ED Notes (Signed)
The pt has a lesion on her rt elbow for 3 days.  The elbow is red swollen and painful.  She has a history of mersa

## 2011-12-05 ENCOUNTER — Emergency Department (HOSPITAL_COMMUNITY): Payer: Self-pay

## 2011-12-05 MED ORDER — SULFAMETHOXAZOLE-TRIMETHOPRIM 800-160 MG PO TABS: 1.0000 | ORAL_TABLET | Freq: Two times a day (BID) | ORAL | Status: AC

## 2011-12-05 MED ORDER — HYDROCODONE-ACETAMINOPHEN 5-325 MG PO TABS: 1.0000 | ORAL_TABLET | Freq: Once | ORAL | Status: DC

## 2011-12-05 MED ORDER — HYDROCODONE-ACETAMINOPHEN 5-325 MG PO TABS
1.0000 | ORAL_TABLET | Freq: Once | ORAL | Status: AC
  Administered 2011-12-05: 1 via ORAL
  Filled 2011-12-05: qty 1

## 2011-12-05 NOTE — ED Provider Notes (Signed)
Medical screening examination/treatment/procedure(s) were performed by non-physician practitioner and as supervising physician I was immediately available for consultation/collaboration.   Gladyes Kudo, MD 12/05/11 0846 

## 2011-12-05 NOTE — ED Provider Notes (Signed)
History     CSN: [id number]  Arrival date & time 12/04/11  2300   First MD Initiated Contact with Patient 12/04/11 2358      Chief Complaint  Patient presents with  . Abscess    (Consider location/radiation/quality/duration/timing/severity/associated sxs/prior treatment) HPI Comments: Patient here with a three day history of right elbow abscess - she states that she has a history of MRSA infections and abscesses and this feels like the same - she denies fever, chills, nausea, vomiting - reports increase in pain and swelling with small amount of purulent drainage from the area - repots is able to flex and extend the elbow.  Patient is a 41 y.o. female presenting with abscess. The history is provided by the patient. No language interpreter was used.  Abscess  This is a new problem. The current episode started less than one week ago. The onset was gradual. The problem occurs occasionally. The problem has been unchanged. The abscess is present on the right arm. The problem is moderate. The abscess is characterized by redness, painfulness and draining. The patient was exposed to OTC medications. The abscess first occurred at home. Pertinent negatives include no anorexia, no decrease in physical activity, not sleeping less, not drinking less, no fever, not sleeping more, no diarrhea, no vomiting, no congestion, no rhinorrhea, no sore throat, no decreased responsiveness and no cough. Her past medical history is significant for skin abscesses in family. There were no sick contacts. She has received no recent medical care.    Past Medical History  Diagnosis Date  . Trouble swallowing   . Change in voice   . Chest pain   . Abdominal pain   . Blood in stool   . Constipation   . Nausea   . Migraines   . Fatigue   . Gallstones 04/10/2011    with biliary pancreatitis  . Dysrhythmia   . Sleep apnea     cpap  . GERD (gastroesophageal reflux disease)   . Anemia     hx of anemic  . Depression       Past Surgical History  Procedure Date  . Nasal polyp surgery     polyp from vocal cords  . Wisdom tooth extraction   . Laparoscopic cholecystectomy w/ cholangiography 04/25/2011    Dr Jamey Ripa  . Cholecystectomy   . Dilation and curettage of uterus   . Esophagogastroduodenoscopy 06/02/2011    Procedure: ESOPHAGOGASTRODUODENOSCOPY (EGD);  Surgeon: Louis Meckel, MD;  Location: [facility];  Service: Endoscopy;  Laterality: N/A;  . Bravo ph study 06/02/2011    Procedure: BRAVO PH STUDY;  Surgeon: Louis Meckel, MD;  Location: [facility];  Service: Endoscopy;  Laterality: N/A;    Family History  Problem Relation Age of Onset  . Diabetes Mother   . Pancreatic cancer Mother   . Lung cancer Father   . Anesthesia problems Neg Hx   . Hypotension Neg Hx   . Malignant hyperthermia Neg Hx   . Pseudochol deficiency Neg Hx     History  Substance Use Topics  . Smoking status: Never Smoker   . Smokeless tobacco: Never Used  . Alcohol Use: No    OB History    Grav Para Term Preterm Abortions TAB SAB Ect Mult Living                  Review of Systems  Constitutional: Negative for fever and decreased responsiveness.  HENT: Negative for congestion, sore throat and rhinorrhea.  Respiratory: Negative for cough.   Gastrointestinal: Negative for vomiting, diarrhea and anorexia.  Skin: Positive for wound.  All other systems reviewed and are negative.    Allergies  Review of patient's allergies indicates no known allergies.  Home Medications   Current Outpatient Rx  Name Route Sig Dispense Refill  . BUPROPION HCL ER (XL) 150 MG PO TB24 Oral Take 150 mg by mouth daily.    Marland Kitchen METHOCARBAMOL 500 MG PO TABS Oral Take 500 mg by mouth 2 (two) times daily. For pain    . ADULT MULTIVITAMIN W/MINERALS CH Oral Take 1 tablet by mouth daily.    . QUETIAPINE FUMARATE 200 MG PO TABS Oral Take 200 mg by mouth at bedtime.    Marland Kitchen HYDROCODONE-ACETAMINOPHEN 5-325 MG PO TABS Oral Take 1 tablet  by mouth once. 30 tablet 0  . SULFAMETHOXAZOLE-TRIMETHOPRIM 800-160 MG PO TABS Oral Take 1 tablet by mouth 2 (two) times daily. 14 tablet 0    BP 133/92  Pulse 88  Temp(Src) 98.4 F (36.9 C) (Oral)  Resp 16  SpO2 100%  LMP 11/22/2011  Physical Exam  Nursing note and vitals reviewed. Constitutional: She is oriented to person, place, and time. She appears well-developed and well-nourished. No distress.  HENT:  Head: Normocephalic and atraumatic.  Right Ear: External ear normal.  Left Ear: External ear normal.  Nose: Nose normal.  Mouth/Throat: Oropharynx is clear and moist. No oropharyngeal exudate.  Eyes: Conjunctivae are normal. Pupils are equal, round, and reactive to light. No scleral icterus.  Neck: Normal range of motion. Neck supple.  Cardiovascular: Normal rate, regular rhythm, normal heart sounds and intact distal pulses.  Exam reveals no gallop and no friction rub.   No murmur heard. Pulmonary/Chest: Effort normal and breath sounds normal. No respiratory distress. She has no wheezes. She has no rales. She exhibits no tenderness.  Abdominal: Soft. Bowel sounds are normal. She exhibits no distension and no mass. There is no tenderness. There is no rebound and no guarding.  Musculoskeletal: Normal range of motion. She exhibits edema and tenderness.       1cm area of induration at the olecranon process of right elbow - flexion and extension intact.  Lymphadenopathy:    She has no cervical adenopathy.  Neurological: She is alert and oriented to person, place, and time. No cranial nerve deficit.  Skin: Skin is warm and dry. No rash noted. There is erythema. No pallor.  Psychiatric: She has a normal mood and affect. Her behavior is normal. Judgment and thought content normal.    ED Course  Procedures (including critical care time)  Labs Reviewed - No data to display Dg Elbow Complete Right  12/05/2011  *RADIOLOGY REPORT*  Clinical Data: Pain and swelling for 4 days.  No  injury.  Skin rupture over the olecranon process with drainage.  RIGHT ELBOW - COMPLETE 3+ VIEW  Comparison: None.  Findings: No evidence of acute fracture or subluxation.  No focal bone lesion or bone destruction.  No bone erosion or bone sclerosis.  Mild hypertrophic degenerative changes.  Bone cortex and trabecular architecture appear intact.  No abnormal periosteal reaction.  No radiopaque soft tissue foreign bodies or soft tissue gas collections.  IMPRESSION: Mild degenerative changes.  No acute bony abnormalities.  Original Report Authenticated By: Marlon Pel, M.D.   INCISION AND DRAINAGE Performed by: Cherrie Distance C. Consent: Verbal consent obtained. Risks and benefits: risks, benefits and alternatives were discussed Type: abscess  Body area: right elbow  Anesthesia:  local infiltration  Local anesthetic: lidocaine 2% without epinephrine  Anesthetic total: 2 ml  Complexity: complex Blunt dissection to break up loculations  Drainage: purulent  Drainage amount: small  Packing material: 1/4 in iodoform gauze  Patient tolerance: Patient tolerated the procedure well with no immediate complications.  &  1. Abscess       MDM  Patient here with abscess to right elbow - does not appear to track into the joint space - I&D with small amount of drainage - will return with any worsening symtpoms.        Izola Price [place], [place] 12/05/11 [phone]

## 2011-12-05 NOTE — ED Notes (Signed)
Patient transported to X-ray 

## 2011-12-05 NOTE — ED Notes (Signed)
Pt with 2 mm pustule to R elbow.  R elbow painful, no redness noted.

## 2011-12-05 NOTE — Discharge Instructions (Signed)
Abscess An abscess (boil or furuncle) is an infected area that contains a collection of pus.  SYMPTOMS Signs and symptoms of an abscess include pain, tenderness, redness, or hardness. You may feel a moveable soft area under your skin. An abscess can occur anywhere in the body.  TREATMENT  A surgical cut (incision) may be made over your abscess to drain the pus. Gauze may be packed into the space or a drain may be looped through the abscess cavity (pocket). This provides a drain that will allow the cavity to heal from the inside outwards. The abscess may be painful for a few days, but should feel much better if it was drained.  Your abscess, if seen early, may not have localized and may not have been drained. If not, another appointment may be required if it does not get better on its own or with medications. HOME CARE INSTRUCTIONS   Only take over-the-counter or prescription medicines for pain, discomfort, or fever as directed by your caregiver.   Take your antibiotics as directed if they were prescribed. Finish them even if you start to feel better.   Keep the skin and clothes clean around your abscess.   If the abscess was drained, you will need to use gauze dressing to collect any draining pus. Dressings will typically need to be changed 3 or more times a day.   The infection may spread by skin contact with others. Avoid skin contact as much as possible.   Practice good hygiene. This includes regular hand washing, cover any draining skin lesions, and do not share personal care items.   If you participate in sports, do not share athletic equipment, towels, whirlpools, or personal care items. Shower after every practice or tournament.   If a draining area cannot be adequately covered:   Do not participate in sports.   Children should not participate in day care until the wound has healed or drainage stops.   If your caregiver has given you a follow-up appointment, it is very important  to keep that appointment. Not keeping the appointment could result in a much worse infection, chronic or permanent injury, pain, and disability. If there is any problem keeping the appointment, you must call back to this facility for assistance.  SEEK MEDICAL CARE IF:   You develop increased pain, swelling, redness, drainage, or bleeding in the wound site.   You develop signs of generalized infection including muscle aches, chills, fever, or a general ill feeling.   You have an oral temperature above 102 F (38.9 C).  MAKE SURE YOU:   Understand these instructions.   Will watch your condition.   Will get help right away if you are not doing well or get worse.  Document Released: 04/08/2005 Document Revised: 06/18/2011 Document Reviewed: 01/31/2008 [employer] Patient Information 2012 [employer].Community-Associated MRSA CA-MRSA stands for community-associated methicillin-resistant Staphylococcus aureus. MRSA is a type of bacteria that is resistant to some common antibiotics. It can cause infections in the skin and many other places in the body. Staphylococcus aureus, often called "staph," is a bacteria that normally lives on the skin or in the nose. Staph on the surface of the skin or in the nose does not cause problems. However, if the staph enters the body through a cut, wound, or break in the skin, an infection can happen. Up until recently, infections with the MRSA type of staph mainly occurred in hospitals and other healthcare settings. There are now increasing problems with MRSA infections in   the community as well. Infections with MRSA may be very serious or even life-threatening. CA-MRSA is becoming more common. It is known to spread in crowded settings, in jails and prisons, and in situations where there is close skin-to-skin contact, such as during sporting events or in locker rooms. MRSA can be spread through shared items, such as children's toys, razors, towels, or sports equipment.    CAUSES All staph, including MRSA, are normally harmless unless they enter the body through a scratch, cut, or wound, such as with surgery. All staph, including MRSA, can be spread from person-to-person by touching contaminated objects or through direct contact.  MRSA now causes illness in people who have not been in hospitals or other healthcare facilities. Cases of MRSA diseases in the community have been associated with:   Recent antibiotic use.   Sharing contaminated towels or clothes.   Having active skin diseases.   Participating in contact sports.   Living in crowded settings.   Intravenous (IV) drug use.   Community-associated MRSA infections are usually skin infections, but may cause other severe illnesses.   Staph bacteria are one of the most common causes of skin infection. However, they are also a common cause of pneumonia, bone or joint infections, and bloodstream infections.  DIAGNOSIS Diagnosis of MRSA is done by cultures of fluid samples that may come from:  Swabs taken from cuts or wounds in infected areas.   Nasal swabs.   Saliva or deep cough specimens from the lungs (sputum).   Urine.   Blood.  Many people are "colonized" with MRSA but have no signs of infection. This means that people carry the MRSA germ on their skin or in their nose and may never develop MRSA infection.  TREATMENT  Treatment varies and is based on how serious, how deep, or how extensive the infection is. For example:  Some skin infections, such as a small boil or abscess, may be treated by draining yellowish-white fluid (pus) from the site of the infection.   Deeper or more widespread soft tissue infections are usually treated with surgery to drain pus and with antibiotic medicine given by vein or by mouth. This may be recommended even if you are pregnant.   Serious infections may require a hospital stay.  If antibiotics are given, they may be needed for several  weeks. PREVENTION Because many people are colonized with staph, including MRSA, preventing the spread of the bacteria from person-to-person is most important. The best way to prevent the spread of bacteria and other germs is through proper hand washing or by using alcohol-based hand disinfectants. The following are other ways to help prevent MRSA infection within community settings.   Wash your hands frequently with soap and water for at least 15 seconds. Otherwise, use alcohol-based hand disinfectants when soap and water is not available.   Make sure people who live with you wash their hands often, too.   Do not share personal items. For example, avoid sharing razors and other personal hygiene items, towels, clothing, and athletic equipment.   Wash and dry your clothes and bedding at the warmest temperatures recommended on the labels.   Keep wounds covered. Pus from infected sores may contain MRSA and other bacteria. Keep cuts and abrasions clean and covered with germ-free (sterile), dry bandages until they are healed.   If you have a wound that appears infected, ask your caregiver if a culture for MRSA and other bacteria should be done.   If you are breastfeeding,   talk to your caregiver about MRSA. You may be asked to temporarily stop breastfeeding.  HOME CARE INSTRUCTIONS   Take your antibiotics as directed. Finish them even if you start to feel better.   Avoid close contact with those around you as much as possible. Do not use towels, razors, toothbrushes, bedding, or other items that will be used by others.   To fight the infection, follow your caregiver's instructions for wound care. Wash your hands before and after changing your bandages.   If you have an intravascular device, such as a catheter, make sure you know how to care for it.   Be sure to tell any healthcare providers that you have MRSA so they are aware of your infection.  SEEK IMMEDIATE MEDICAL CARE IF:  The infection  appears to be getting worse. Signs include:   Increased warmth, redness, or tenderness around the wound site.   A red line that extends from the infection site.   A dark color in the area around the infection.   Wound drainage that is tan, yellow, or green.   A bad smell coming from the wound.   You feel sick to your stomach (nauseous) and throw up (vomit) or cannot keep medicine down.   You have a fever.   Your baby is older than 3 months with a rectal temperature of 102 F (38.9 C) or higher.   Your baby is 3 months old or younger with a rectal temperature of 100.4 F (38 C) or higher.   You have difficulty breathing.  MAKE SURE YOU:   Understand these instructions.   Will watch your condition.   Will get help right away if you are not doing well or get worse.  Document Released: 10/02/2005 Document Revised: 06/18/2011 Document Reviewed: 10/02/2010 ExitCare Patient Information 2012 [employer]. 

## 2011-12-10 ENCOUNTER — Encounter (HOSPITAL_COMMUNITY): Payer: Self-pay | Admitting: *Deleted

## 2011-12-10 ENCOUNTER — Emergency Department (HOSPITAL_COMMUNITY)
Admission: EM | Admit: 2011-12-10 | Discharge: 2011-12-10 | Disposition: A | Discharge status: Home / Self Care | Payer: Self-pay | Attending: Emergency Medicine | Admitting: Emergency Medicine

## 2011-12-10 DIAGNOSIS — F3289 Other specified depressive episodes: Secondary | ICD-10-CM | POA: Insufficient documentation

## 2011-12-10 DIAGNOSIS — IMO0002 Reserved for concepts with insufficient information to code with codable children: Secondary | ICD-10-CM | POA: Insufficient documentation

## 2011-12-10 DIAGNOSIS — G4730 Sleep apnea, unspecified: Secondary | ICD-10-CM | POA: Insufficient documentation

## 2011-12-10 DIAGNOSIS — L0291 Cutaneous abscess, unspecified: Secondary | ICD-10-CM

## 2011-12-10 DIAGNOSIS — K219 Gastro-esophageal reflux disease without esophagitis: Secondary | ICD-10-CM | POA: Insufficient documentation

## 2011-12-10 DIAGNOSIS — F329 Major depressive disorder, single episode, unspecified: Secondary | ICD-10-CM | POA: Insufficient documentation

## 2011-12-10 MED ORDER — HYDROCODONE-ACETAMINOPHEN 5-325 MG PO TABS
1.0000 | ORAL_TABLET | Freq: Once | ORAL | Status: AC
  Administered 2011-12-10: 1 via ORAL
  Filled 2011-12-10: qty 1

## 2011-12-10 MED ORDER — HYDROCODONE-ACETAMINOPHEN 5-325 MG PO TABS: 2.0000 | ORAL_TABLET | ORAL | Status: AC | PRN

## 2011-12-10 MED ORDER — CLINDAMYCIN HCL 300 MG PO CAPS: 300.0000 mg | ORAL_CAPSULE | Freq: Four times a day (QID) | ORAL | Status: AC

## 2011-12-10 NOTE — Discharge Instructions (Signed)
You were seen for reevaluation of your skin infection to your elbow area. Your pus was drained through a small incision. Your providers feel you may benefit from additional antibiotic to treat your infection. Continue to use warm compress over the area with topical antibiotics. Keep the skin dry and avoid soaking your elbow and water. Please call your primary care provider for a close followup appointment or return to the hospital in the next 48 hours for recheck of your symptoms. Return sooner if you develop any worsening symptoms, increased swelling, increased pain, fever, chills, sweats.    Abscess An abscess (boil or furuncle) is an infected area that contains a collection of pus.  SYMPTOMS Signs and symptoms of an abscess include pain, tenderness, redness, or hardness. You may feel a moveable soft area under your skin. An abscess can occur anywhere in the body.  TREATMENT  A surgical cut (incision) may be made over your abscess to drain the pus. Gauze may be packed into the space or a drain may be looped through the abscess cavity (pocket). This provides a drain that will allow the cavity to heal from the inside outwards. The abscess may be painful for a few days, but should feel much better if it was drained.  Your abscess, if seen early, may not have localized and may not have been drained. If not, another appointment may be required if it does not get better on its own or with medications. HOME CARE INSTRUCTIONS   Only take over-the-counter or prescription medicines for pain, discomfort, or fever as directed by your caregiver.   Take your antibiotics as directed if they were prescribed. Finish them even if you start to feel better.   Keep the skin and clothes clean around your abscess.   If the abscess was drained, you will need to use gauze dressing to collect any draining pus. Dressings will typically need to be changed 3 or more times a day.   The infection may spread by skin contact  with others. Avoid skin contact as much as possible.   Practice good hygiene. This includes regular hand washing, cover any draining skin lesions, and do not share personal care items.   If you participate in sports, do not share athletic equipment, towels, whirlpools, or personal care items. Shower after every practice or tournament.   If a draining area cannot be adequately covered:   Do not participate in sports.   Children should not participate in day care until the wound has healed or drainage stops.   If your caregiver has given you a follow-up appointment, it is very important to keep that appointment. Not keeping the appointment could result in a much worse infection, chronic or permanent injury, pain, and disability. If there is any problem keeping the appointment, you must call back to this facility for assistance.  SEEK MEDICAL CARE IF:   You develop increased pain, swelling, redness, drainage, or bleeding in the wound site.   You develop signs of generalized infection including muscle aches, chills, fever, or a general ill feeling.   You have an oral temperature above 102 F (38.9 C).  MAKE SURE YOU:   Understand these instructions.   Will watch your condition.   Will get help right away if you are not doing well or get worse.  Document Released: 04/08/2005 Document Revised: 06/18/2011 Document Reviewed: 01/31/2008 [facility] Patient Information 2012 [place], [place].   Cellulitis Cellulitis is an infection of the skin and the tissue beneath it.  The area is typically red and tender. It is caused by germs (bacteria) (usually staph or strep) that enter the body through cuts or sores. Cellulitis most commonly occurs in the arms or lower legs.  HOME CARE INSTRUCTIONS   If you are given a prescription for medications which kill germs (antibiotics), take as directed until finished.   If the infection is on the arm or leg, keep the limb elevated as able.   Use a warm cloth  several times per day to relieve pain and encourage healing.   See your caregiver for recheck of the infected site as directed if problems arise.   Only take over-the-counter or prescription medicines for pain, discomfort, or fever as directed by your caregiver.  SEEK MEDICAL CARE IF:   The area of redness (inflammation) is spreading, there are red streaks coming from the infected site, or if a part of the infection begins to turn dark in color.   The joint or bone underneath the infected skin becomes painful after the skin has healed.   The infection returns in the same or another area after it seems to have gone away.   A boil or bump swells up. This may be an abscess.   New, unexplained problems such as pain or fever develop.  SEEK IMMEDIATE MEDICAL CARE IF:   You have a fever.   You or your child feels drowsy or lethargic.   There is vomiting, diarrhea, or lasting discomfort or feeling ill (malaise) with muscle aches and pains.  MAKE SURE YOU:   Understand these instructions.   Will watch your condition.   Will get help right away if you are not doing well or get worse.  Document Released: 04/08/2005 Document Revised: 06/18/2011 Document Reviewed: 02/15/2008 [facility] Patient Information 2012 [place], [place].   RESOURCE GUIDE  Dental Problems  Patients with Medicaid: [facility] [phone] [address]                                           [phone] [address] Phone:  [phone]                                                  Phone:  [phone]  If unable to pay or uninsured, contact:  [facility] or [facility]. to become qualified for the adult dental clinic.  Chronic Pain Problems Contact [facility]  [phone] Patients need to be referred by their primary care doctor.  Insufficient Money for Medicine Contact [employer]:  call "211" or [facility] ([phone].  No  Primary Care Doctor Call [facility]  [phone] Other agencies that provide inexpensive medical care    [facility]  [phone]    [facility]  [phone]    [facility]  [phone]    [facility]  [phone]    [facility]  [phone]    [facility]  [phone]  Psychological Services [facility] Behavioral Health  [phone] [facility] Services  [phone] [facility] Mental Health   [phone] (emergency services [phone])  Substance Abuse Resources [facility]  [phone] [facility] [phone] [facility] [place] [phone] Floydene Flock [phone] Residential & Outpatient Substance Abuse Program  [phone]  Abuse/Neglect [facility] Child Abuse Hotline 706-860-3826 [facility] Child Abuse Hotline (878)585-9329 (After Hours)  Emergency Shelter [facility] [phone]  Maternity Homes [facility] [place] [facility] 2490266609 [facility] Services [phone]  MRSA Hotline #:   (712) 374-9067    [facility] Resources  [facility]     [employer]                          [facility] Dept. [address] [place]                       [address]      [address]  Blondell Reveal Phone:  [phone]                                   Phone:  [phone]                 Phone:  [phone]  [facility] Mental Health Phone:  [phone]  [facility] Child Abuse Hotline (918)518-2357 320-178-4983 (After Hours)

## 2011-12-10 NOTE — ED Provider Notes (Signed)
History     CSN: [id number]  Arrival date & time 12/10/11  0052   First MD Initiated Contact with Patient 12/10/11 0118      Chief Complaint  Patient presents with  . Abscess   HPI  History provided by the patient and recent medical chart. Patient is a 41 year old female who returns for a recheck of an abscess infection to her posterior right elbow. Patient was seen 4 days ago for similar complaints. She small incision and drainage of an abscess with mild purulent discharge. Patient states that since that time she was taking antibiotics but has continued swelling and pain to that area. Patient has full range of motion of elbow. She denies any numbness or weakness to the hand. There is no erythematous streak up the arm. She denies any fever, chills, sweats. Pain is worse with palpation and movement. She denies any other aggravating or alleviating factors. No additional associated symptoms.    Past Medical History  Diagnosis Date  . Trouble swallowing   . Change in voice   . Chest pain   . Abdominal pain   . Blood in stool   . Constipation   . Nausea   . Migraines   . Fatigue   . Gallstones 04/10/2011    with biliary pancreatitis  . Dysrhythmia   . Sleep apnea     cpap  . GERD (gastroesophageal reflux disease)   . Anemia     hx of anemic  . Depression     Past Surgical History  Procedure Date  . Nasal polyp surgery     polyp from vocal cords  . Wisdom tooth extraction   . Laparoscopic cholecystectomy w/ cholangiography 04/25/2011    Dr Jamey Ripa  . Cholecystectomy   . Dilation and curettage of uterus   . Esophagogastroduodenoscopy 06/02/2011    Procedure: ESOPHAGOGASTRODUODENOSCOPY (EGD);  Surgeon: Louis Meckel, MD;  Location: [facility];  Service: Endoscopy;  Laterality: N/A;  . Bravo ph study 06/02/2011    Procedure: BRAVO PH STUDY;  Surgeon: Louis Meckel, MD;  Location: [facility];  Service: Endoscopy;  Laterality: N/A;    Family History  Problem  Relation Age of Onset  . Diabetes Mother   . Pancreatic cancer Mother   . Lung cancer Father   . Anesthesia problems Neg Hx   . Hypotension Neg Hx   . Malignant hyperthermia Neg Hx   . Pseudochol deficiency Neg Hx     History  Substance Use Topics  . Smoking status: Never Smoker   . Smokeless tobacco: Never Used  . Alcohol Use: No    OB History    Grav Para Term Preterm Abortions TAB SAB Ect Mult Living                  Review of Systems  Constitutional: Negative for fever and chills.  Gastrointestinal: Negative for nausea and vomiting.  Musculoskeletal: Positive for joint swelling.  Neurological: Negative for weakness and numbness.    Allergies  Review of patient's allergies indicates no known allergies.  Home Medications   Current Outpatient Rx  Name Route Sig Dispense Refill  . BUPROPION HCL ER (XL) 150 MG PO TB24 Oral Take 150 mg by mouth daily.    . ADULT MULTIVITAMIN W/MINERALS CH Oral Take 1 tablet by mouth daily.    . QUETIAPINE FUMARATE 200 MG PO TABS Oral Take 200 mg by mouth at bedtime.    . SULFAMETHOXAZOLE-TRIMETHOPRIM 800-160 MG PO TABS Oral Take 1  tablet by mouth 2 (two) times daily. 14 tablet 0    BP 142/74  Pulse 84  Temp(Src) 98.5 F (36.9 C) (Oral)  Resp 20  SpO2 98%  LMP 11/22/2011  Physical Exam  Nursing note and vitals reviewed. Constitutional: She is oriented to person, place, and time. She appears well-developed and well-nourished. No distress.  HENT:  Head: Normocephalic.  Cardiovascular: Normal rate and regular rhythm.   Pulmonary/Chest: Effort normal and breath sounds normal.  Abdominal: Soft.  Neurological: She is alert and oriented to person, place, and time.  Skin: Skin is warm and dry. No rash noted.       There is a nodular swelling to the posterior right elbow with a pinpoint of purulent drainage. There is mild induration of the skin distally. The patient has full range of motion of elbow.  Psychiatric: She has a normal  mood and affect. Her behavior is normal.    ED Course  Procedures   INCISION AND DRAINAGE Performed by: Angus Seller Consent: Verbal consent obtained. Risks and benefits: risks, benefits and alternatives were discussed Type: abscess  Body area: Right elbow  Anesthesia: local infiltration  Local anesthetic: lidocaine 2 % with epinephrine  Anesthetic total: 1 ml  Complexity: complex Blunt dissection to break up loculations  Drainage: purulent  Drainage amount: Small   Packing material: None  Patient tolerance: Patient tolerated the procedure well with no immediate complications.        1. Abscess and cellulitis       MDM  Patient seen and evaluated. Patient in no acute distress. Patient is nontoxic-appearing and afebrile. Patient has full range of motion of elbow. No sinus concerning for septic joint.   Patient feels the symptoms are not improving. At this time will place patient on clindamycin as well for better strep coverage for possible cellulitis component. Patient advised followup in 48 hours for recheck.     Angus Seller, [place] 12/10/11 [phone]

## 2011-12-10 NOTE — ED Notes (Signed)
Pt had an abscess to her R elbow I and D'd last Friday.  Pt has been taking antibiotics since then, but the wound is increasing.  Pt afebrile.  Pt has hx of MRSA.

## 2011-12-10 NOTE — ED Notes (Signed)
Abscess to rgt elbow area; states seen in ED last week for same - had I&D done - reports area no better; small amt of swelling and tenderness noted; small amount of yellowish drainage noted to wound

## 2011-12-10 NOTE — ED Provider Notes (Signed)
Medical screening examination/treatment/procedure(s) were performed by non-physician practitioner and as supervising physician I was immediately available for consultation/collaboration.   Benny Lennert, MD 12/10/11 [phone]

## 2012-05-01 ENCOUNTER — Emergency Department (HOSPITAL_COMMUNITY)
Admission: EM | Admit: 2012-05-01 | Discharge: 2012-05-01 | Disposition: A | Discharge status: Home / Self Care | Payer: Self-pay | Attending: Emergency Medicine | Admitting: Emergency Medicine

## 2012-05-01 ENCOUNTER — Encounter (HOSPITAL_COMMUNITY): Payer: Self-pay | Admitting: Nurse Practitioner

## 2012-05-01 DIAGNOSIS — L0231 Cutaneous abscess of buttock: Secondary | ICD-10-CM | POA: Insufficient documentation

## 2012-05-01 DIAGNOSIS — L02219 Cutaneous abscess of trunk, unspecified: Secondary | ICD-10-CM | POA: Insufficient documentation

## 2012-05-01 DIAGNOSIS — L03317 Cellulitis of buttock: Secondary | ICD-10-CM | POA: Insufficient documentation

## 2012-05-01 DIAGNOSIS — Z79899 Other long term (current) drug therapy: Secondary | ICD-10-CM | POA: Insufficient documentation

## 2012-05-01 DIAGNOSIS — L02419 Cutaneous abscess of limb, unspecified: Secondary | ICD-10-CM | POA: Insufficient documentation

## 2012-05-01 DIAGNOSIS — Z9089 Acquired absence of other organs: Secondary | ICD-10-CM | POA: Insufficient documentation

## 2012-05-01 HISTORY — DX: Carrier or suspected carrier of methicillin resistant Staphylococcus aureus: Z22.322

## 2012-05-01 MED ORDER — SULFAMETHOXAZOLE-TMP DS 800-160 MG PO TABS
1.0000 | ORAL_TABLET | Freq: Once | ORAL | Status: AC
  Administered 2012-05-01: 1 via ORAL
  Filled 2012-05-01: qty 1

## 2012-05-01 MED ORDER — SULFAMETHOXAZOLE-TRIMETHOPRIM 800-160 MG PO TABS: 1.0000 | ORAL_TABLET | Freq: Two times a day (BID) | ORAL | Status: DC

## 2012-05-01 NOTE — ED Provider Notes (Signed)
History   This chart was scribed for Lisa Booze, MD by [name]. The patient was seen in room TR08C/TR08C and the patient's care was started at 6:54PM.     CSN: [id number]  Arrival date & time 05/01/12  1720   First MD Initiated Contact with Patient 05/01/12 1854      Chief Complaint  Patient presents with  . Recurrent Skin Infections    (Consider location/radiation/quality/duration/timing/severity/associated sxs/prior treatment) Patient is a 41 y.o. female presenting with abscess. The history is provided by the patient. No language interpreter was used.  Abscess  This is a recurrent problem. The current episode started more than one week ago. The problem occurs rarely. The problem has been gradually worsening. The abscess is present on the abdomen, left buttock and left upper leg. The problem is moderate. The abscess is characterized by painfulness. It is unknown what she was exposed to. The abscess first occurred at home. Pertinent negatives include no fever.    Lisa [name] is a 41 y.o. female , with a hx of MRSA infection (diagnosed 2 years ago), who presents to the Emergency Department complaining of gradual, progressively worsening, abscess' located on the left buttocks, abdomen, and posterior left thigh, onset two weeks ago. The pt rates her pain due to the abscess' at 8/10 at present and 10/10 at worst. Modifying factors include application of warm compress' which provide moderate relief of the abscess'.  The pt denies chills, sweats, and fever.   The pt does not smoke or drink alcohol.   PCP is [name].    Past Medical History  Diagnosis Date  . Trouble swallowing   . Change in voice   . Chest pain   . Abdominal pain   . Blood in stool   . Constipation   . Nausea   . Migraines   . Fatigue   . Gallstones 04/10/2011    with biliary pancreatitis  . Dysrhythmia   . Sleep apnea     cpap  . GERD (gastroesophageal reflux disease)   . Anemia     hx of  anemic  . Depression   . MRSA carrier     Past Surgical History  Procedure Date  . Nasal polyp surgery     polyp from vocal cords  . Wisdom tooth extraction   . Laparoscopic cholecystectomy w/ cholangiography 04/25/2011    Dr [name]  . Cholecystectomy   . Dilation and curettage of uterus   . Esophagogastroduodenoscopy 06/02/2011    Procedure: ESOPHAGOGASTRODUODENOSCOPY (EGD);  Surgeon: Lisa Meckel, MD;  [name];  Service: Endoscopy;  Laterality: N/A;  . Bravo ph study 06/02/2011    Procedure: BRAVO PH STUDY;  Surgeon: Lisa Meckel, MD;  Location: [facility];  Service: Endoscopy;  Laterality: N/A;    Family History  Problem Relation Age of Onset  . Diabetes Mother   . Pancreatic cancer Mother   . Lung cancer Father   . Anesthesia problems Neg Hx   . Hypotension Neg Hx   . Malignant hyperthermia Neg Hx   . Pseudochol deficiency Neg Hx     History  Substance Use Topics  . Smoking status: Never Smoker   . Smokeless tobacco: Never Used  . Alcohol Use: No    OB History    Grav Para Term Preterm Abortions TAB SAB Ect Mult Living                  Review of Systems  Constitutional: Negative  for fever.  All other systems reviewed and are negative.    Allergies  Review of patient's allergies indicates no known allergies.  Home Medications   Current Outpatient Rx  Name Route Sig Dispense Refill  . FERROUS SULFATE 325 (65 FE) MG PO TABS Oral Take 325 mg by mouth daily with breakfast.    . LITHIUM CARBONATE 300 MG PO TABS Oral Take 300 mg by mouth 2 (two) times daily.    Marland Kitchen OMEPRAZOLE 20 MG PO CPDR Oral Take 40 mg by mouth 2 (two) times daily.    Marland Kitchen RISPERIDONE 1 MG PO TABS Oral Take 1 mg by mouth 2 (two) times daily.      BP 118/71  Pulse 67  Temp 98.7 F (37.1 C) (Oral)  Resp 22  SpO2 98%  Physical Exam  Nursing note and vitals reviewed. Constitutional: She is oriented to person, place, and time. She appears well-developed and  well-nourished.  HENT:  Head: Atraumatic.  Nose: Nose normal.  Eyes: Conjunctivae normal and EOM are normal.  Neck: Normal range of motion.  Cardiovascular: Normal rate, regular rhythm and normal heart sounds.   Pulmonary/Chest: Effort normal and breath sounds normal.  Abdominal: Soft. Bowel sounds are normal.  Musculoskeletal: Normal range of motion.  Neurological: She is alert and oriented to person, place, and time.  Skin: Skin is warm and dry.       2 mild tender, nodules in the skin of the anterior abdomen and medial right thigh, one on posterior left thigh. Abscess present in the left gluteal area.   Psychiatric: She has a normal mood and affect. Her behavior is normal.    ED Course  Procedures (including critical care time)  DIAGNOSTIC STUDIES: Oxygen Saturation is 98% on room air, normal by my interpretation.    COORDINATION OF CARE:   7:00 PM- Treatment plan concerning incision and drainage of abscess, application of antibiotics, and pain management discussed with patient. Pt agrees with treatment.   INCISION AND DRAINAGE PROCEDURE NOTE: Patient identification was confirmed and verbal consent was obtained. This procedure was performed by Lisa Booze, MD at 7:05 PM. Site: Left gluteal area.  Sterile procedures observed Anesthetic used (type and amt): 2 ml of 2% lidocaine w/o epinephrine.  Blade size: 11 Drainage: Moderate purulent drainage.  Complexity: Complex Packing used: No packing.  Site anesthetized, incision made over site, wound drained and explored loculations, rinsed with copious amounts of normal saline, wound packed with sterile gauze, covered with dry, sterile dressing.  Pt tolerated procedure well without complications.  Instructions for care discussed verbally and pt provided with additional written instructions for homecare and f/u.    1. Abscess, gluteal, left       MDM  Abscess of buttock. For other areas have small nodules which do not  have any evidence of collection of pus. These may develop into abscesses, but there is no indication for incising and draining them at this point. She is given a prescription for Bactrim after incision and drainage of the buttock abscess.      I personally performed the services described in this documentation, which was scribed in my presence. The recorded information has been reviewed and considered.      Lisa Booze, MD 05/01/12 2035

## 2012-05-01 NOTE — ED Notes (Signed)
Pt reports multiple abscess to body, one on leg, 2 on buttock, 2 on abdomen increasingly swollen and painful for past several days. Hx mrsa.

## 2012-07-28 DIAGNOSIS — L03319 Cellulitis of trunk, unspecified: Secondary | ICD-10-CM | POA: Insufficient documentation

## 2012-07-28 DIAGNOSIS — Z79899 Other long term (current) drug therapy: Secondary | ICD-10-CM | POA: Insufficient documentation

## 2012-07-28 DIAGNOSIS — D649 Anemia, unspecified: Secondary | ICD-10-CM | POA: Insufficient documentation

## 2012-07-28 DIAGNOSIS — F329 Major depressive disorder, single episode, unspecified: Secondary | ICD-10-CM | POA: Insufficient documentation

## 2012-07-28 DIAGNOSIS — Z8669 Personal history of other diseases of the nervous system and sense organs: Secondary | ICD-10-CM | POA: Insufficient documentation

## 2012-07-28 DIAGNOSIS — L02219 Cutaneous abscess of trunk, unspecified: Secondary | ICD-10-CM | POA: Insufficient documentation

## 2012-07-28 DIAGNOSIS — K219 Gastro-esophageal reflux disease without esophagitis: Secondary | ICD-10-CM | POA: Insufficient documentation

## 2012-07-28 DIAGNOSIS — Z8679 Personal history of other diseases of the circulatory system: Secondary | ICD-10-CM | POA: Insufficient documentation

## 2012-07-28 DIAGNOSIS — F3289 Other specified depressive episodes: Secondary | ICD-10-CM | POA: Insufficient documentation

## 2012-07-28 DIAGNOSIS — Z8719 Personal history of other diseases of the digestive system: Secondary | ICD-10-CM | POA: Insufficient documentation

## 2012-07-28 DIAGNOSIS — Z8614 Personal history of Methicillin resistant Staphylococcus aureus infection: Secondary | ICD-10-CM | POA: Insufficient documentation

## 2012-07-29 ENCOUNTER — Emergency Department (HOSPITAL_COMMUNITY)
Admission: EM | Admit: 2012-07-29 | Discharge: 2012-07-29 | Disposition: A | Discharge status: Home / Self Care | Payer: Medicaid Other | Attending: Emergency Medicine | Admitting: Emergency Medicine

## 2012-07-29 ENCOUNTER — Encounter (HOSPITAL_COMMUNITY): Payer: Self-pay | Admitting: Emergency Medicine

## 2012-07-29 DIAGNOSIS — L0291 Cutaneous abscess, unspecified: Secondary | ICD-10-CM

## 2012-07-29 MED ORDER — PERCOCET 5-325 MG PO TABS: 1.0000 | ORAL_TABLET | Freq: Four times a day (QID) | ORAL | Status: DC | PRN

## 2012-07-29 MED ORDER — SULFAMETHOXAZOLE-TRIMETHOPRIM 800-160 MG PO TABS: 2.0000 | ORAL_TABLET | Freq: Two times a day (BID) | ORAL | Status: DC

## 2012-07-29 NOTE — ED Notes (Signed)
Patient is alert and orientedx4.  Patient was explained discharge instructions and she understood them without questions. 

## 2012-07-29 NOTE — ED Provider Notes (Signed)
History     CSN: [id number]  Arrival date & time 07/28/12  2341   None     Chief Complaint  Patient presents with  . Rash    (Consider location/radiation/quality/duration/timing/severity/associated sxs/prior treatment) Patient is a 42 y.o. female presenting with rash. The history is provided by the patient and medical records.  Rash  This is a recurrent problem. The current episode started more than 1 week ago. The problem has been gradually worsening. The problem is associated with nothing. There has been no fever. Affected Location: suprapubic region. The pain is at a severity of 6/10. The pain is moderate. The pain has been constant since onset. Associated symptoms include pain. Pertinent negatives include no blisters, no itching and no weeping. She has tried nothing for the symptoms.    Past Medical History  Diagnosis Date  . Trouble swallowing   . Change in voice   . Chest pain   . Abdominal pain   . Blood in stool   . Constipation   . Nausea   . Migraines   . Fatigue   . Gallstones 04/10/2011    with biliary pancreatitis  . Dysrhythmia   . Sleep apnea     cpap  . GERD (gastroesophageal reflux disease)   . Anemia     hx of anemic  . Depression   . MRSA carrier     Past Surgical History  Procedure Date  . Nasal polyp surgery     polyp from vocal cords  . Wisdom tooth extraction   . Laparoscopic cholecystectomy w/ cholangiography 04/25/2011    Dr Jamey Ripa  . Cholecystectomy   . Dilation and curettage of uterus   . Esophagogastroduodenoscopy 06/02/2011    Procedure: ESOPHAGOGASTRODUODENOSCOPY (EGD);  Surgeon: Louis Meckel, MD;  Location: [facility];  Service: Endoscopy;  Laterality: N/A;  . Bravo ph study 06/02/2011    Procedure: BRAVO PH STUDY;  Surgeon: Louis Meckel, MD;  Location: [facility];  Service: Endoscopy;  Laterality: N/A;    Family History  Problem Relation Age of Onset  . Diabetes Mother   . Pancreatic cancer Mother   . Lung cancer  Father   . Anesthesia problems Neg Hx   . Hypotension Neg Hx   . Malignant hyperthermia Neg Hx   . Pseudochol deficiency Neg Hx     History  Substance Use Topics  . Smoking status: Never Smoker   . Smokeless tobacco: Never Used  . Alcohol Use: No    OB History    Grav Para Term Preterm Abortions TAB SAB Ect Mult Living                  Review of Systems  Skin: Positive for rash. Negative for itching.  All other systems reviewed and are negative.    Allergies  Review of patient's allergies indicates no known allergies.  Home Medications   Current Outpatient Rx  Name  Route  Sig  Dispense  Refill  . BENZTROPINE MESYLATE 1 MG PO TABS   Oral   Take 1 mg by mouth 2 (two) times daily.         Marland Kitchen CITALOPRAM HYDROBROMIDE 20 MG PO TABS   Oral   Take 20 mg by mouth daily.         Marland Kitchen FERROUS SULFATE 325 (65 FE) MG PO TABS   Oral   Take 325 mg by mouth daily with breakfast.         . LITHIUM CARBONATE 300  MG PO TABS   Oral   Take 300-600 mg by mouth 2 (two) times daily. Take 1 capsule in the morning and take 2 capsules in the evening         . RISPERIDONE 2 MG PO TABS   Oral   Take 4 mg by mouth daily.           BP 119/67  Pulse 60  Temp 98.6 F (37 C) (Oral)  Resp 14  SpO2 98%  LMP 07/22/2012  Physical Exam  Nursing note and vitals reviewed. Constitutional: She is oriented to person, place, and time. She appears well-developed and well-nourished. She does not have a sickly appearance. She does not appear ill. No distress.  HENT:  Head: Normocephalic and atraumatic.  Eyes: Conjunctivae normal and EOM are normal.  Neck: Normal range of motion. Neck supple.  Cardiovascular: Normal rate and regular rhythm.   Pulmonary/Chest: Effort normal and breath sounds normal.  Musculoskeletal: She exhibits no edema.  Lymphadenopathy:       Head (right side): No submental, no preauricular and no posterior auricular adenopathy present.       Head (left side): No  submental, no submandibular, no preauricular and no posterior auricular adenopathy present.    She has no axillary adenopathy.  Neurological: She is alert and oriented to person, place, and time.  Skin: Skin is warm and dry. No rash noted. She is not diaphoretic.       1cm sized abscess located in the suprapubic region. Extreme tenderness to palpation. Not currently draining. Abscess is not fluctuant and has no surrounding erythema, or induration.    ED Course  Procedures (including critical care time)  Labs Reviewed - No data to display No results found.   No diagnosis found.    MDM  abscess  Patient with skin very early abscess not amenable to incision and drainage. Conservative home therapies discussed including sitz baths and warm compresses,  Encouraged home warm soaks. No signs of cellulitis is surrounding skin.  Will d/c to home with abx therapy and recommend return to [facility] for I & D when ready.        Jaci Carrel, [place] 07/29/12 [phone]

## 2012-07-29 NOTE — ED Notes (Signed)
Pt c/o rash in vaginal area for 2 weeks.  St's rash is painful

## 2012-07-29 NOTE — ED Provider Notes (Signed)
Medical screening examination/treatment/procedure(s) were performed by non-physician practitioner and as supervising physician I was immediately available for consultation/collaboration.  Sheanna Dail K Cato Liburd, MD 07/29/12 0706 

## 2012-12-20 ENCOUNTER — Emergency Department (HOSPITAL_COMMUNITY): Payer: Medicaid Other

## 2012-12-20 ENCOUNTER — Encounter (HOSPITAL_COMMUNITY): Payer: Self-pay | Admitting: *Deleted

## 2012-12-20 ENCOUNTER — Emergency Department (HOSPITAL_COMMUNITY)
Admission: EM | Admit: 2012-12-20 | Discharge: 2012-12-20 | Disposition: A | Discharge status: Home / Self Care | Payer: Medicaid Other | Attending: Emergency Medicine | Admitting: Emergency Medicine

## 2012-12-20 DIAGNOSIS — F3289 Other specified depressive episodes: Secondary | ICD-10-CM | POA: Insufficient documentation

## 2012-12-20 DIAGNOSIS — R0602 Shortness of breath: Secondary | ICD-10-CM

## 2012-12-20 DIAGNOSIS — M7989 Other specified soft tissue disorders: Secondary | ICD-10-CM | POA: Insufficient documentation

## 2012-12-20 DIAGNOSIS — G43909 Migraine, unspecified, not intractable, without status migrainosus: Secondary | ICD-10-CM | POA: Insufficient documentation

## 2012-12-20 DIAGNOSIS — F329 Major depressive disorder, single episode, unspecified: Secondary | ICD-10-CM | POA: Insufficient documentation

## 2012-12-20 DIAGNOSIS — Z8719 Personal history of other diseases of the digestive system: Secondary | ICD-10-CM | POA: Insufficient documentation

## 2012-12-20 DIAGNOSIS — Z22322 Carrier or suspected carrier of Methicillin resistant Staphylococcus aureus: Secondary | ICD-10-CM | POA: Insufficient documentation

## 2012-12-20 DIAGNOSIS — R0789 Other chest pain: Secondary | ICD-10-CM | POA: Insufficient documentation

## 2012-12-20 DIAGNOSIS — Z79899 Other long term (current) drug therapy: Secondary | ICD-10-CM | POA: Insufficient documentation

## 2012-12-20 DIAGNOSIS — R059 Cough, unspecified: Secondary | ICD-10-CM | POA: Insufficient documentation

## 2012-12-20 DIAGNOSIS — Z791 Long term (current) use of non-steroidal anti-inflammatories (NSAID): Secondary | ICD-10-CM | POA: Insufficient documentation

## 2012-12-20 DIAGNOSIS — Z87442 Personal history of urinary calculi: Secondary | ICD-10-CM | POA: Insufficient documentation

## 2012-12-20 DIAGNOSIS — Z862 Personal history of diseases of the blood and blood-forming organs and certain disorders involving the immune mechanism: Secondary | ICD-10-CM | POA: Insufficient documentation

## 2012-12-20 DIAGNOSIS — R05 Cough: Secondary | ICD-10-CM | POA: Insufficient documentation

## 2012-12-20 DIAGNOSIS — J3489 Other specified disorders of nose and nasal sinuses: Secondary | ICD-10-CM | POA: Insufficient documentation

## 2012-12-20 DIAGNOSIS — R1310 Dysphagia, unspecified: Secondary | ICD-10-CM | POA: Insufficient documentation

## 2012-12-20 LAB — BASIC METABOLIC PANEL
BUN: 16 mg/dL (ref 6–23)
Chloride: 102 mEq/L (ref 96–112)
GFR calc Af Amer: 86 mL/min — ABNORMAL LOW (ref >90)
Potassium: 3.2 mEq/L — ABNORMAL LOW (ref 3.5–5.1)
Sodium: 139 mEq/L (ref 135–145)

## 2012-12-20 LAB — CBC WITH DIFFERENTIAL/PLATELET
Hemoglobin: 12.6 g/dL (ref 12.0–15.0)
Lymphocytes Relative: 27 % (ref 12–46)
Lymphs Abs: 2.3 10*3/uL (ref 0.7–4.0)
MCH: 27.5 pg (ref 26.0–34.0)
Monocytes Relative: 9 % (ref 3–12)
Neutro Abs: 4.2 10*3/uL (ref 1.7–7.7)
Neutrophils Relative %: 48 % (ref 43–77)
RBC: 4.59 MIL/uL (ref 3.87–5.11)
WBC: 8.7 10*3/uL (ref 4.0–10.5)

## 2012-12-20 LAB — PRO B NATRIURETIC PEPTIDE: Pro B Natriuretic peptide (BNP): <5 pg/mL (ref 0–125)

## 2012-12-20 MED ORDER — RANITIDINE HCL 150 MG PO CAPS: 150.0000 mg | ORAL_CAPSULE | Freq: Two times a day (BID) | ORAL | Status: DC

## 2012-12-20 MED ORDER — ALBUTEROL SULFATE (5 MG/ML) 0.5% IN NEBU
5.0000 mg | INHALATION_SOLUTION | Freq: Once | RESPIRATORY_TRACT | Status: AC
  Administered 2012-12-20: 5 mg via RESPIRATORY_TRACT
  Filled 2012-12-20: qty 1

## 2012-12-20 MED ORDER — ALBUTEROL SULFATE HFA 108 (90 BASE) MCG/ACT IN AERS
2.0000 | INHALATION_SPRAY | RESPIRATORY_TRACT | Status: DC
  Administered 2012-12-20: 2 via RESPIRATORY_TRACT
  Filled 2012-12-20 (×2): qty 6.7

## 2012-12-20 NOTE — ED Provider Notes (Signed)
  Medical screening examination/treatment/procedure(s) were performed by non-physician practitioner and as supervising physician I was immediately available for consultation/collaboration.    Gurneet Matarese, MD 12/20/12 1631 

## 2012-12-20 NOTE — ED Provider Notes (Signed)
History     CSN: [id number]  Arrival date & time 12/20/12  2952   First MD Initiated Contact with Patient 12/20/12 [phone]      Chief Complaint  Patient presents with  . Shortness of Breath  . Chest Pain    (Consider location/radiation/quality/duration/timing/severity/associated sxs/prior treatment) HPI [name] is a 42 y.o. female who presents to ED with complaint of shortness of breath, pain in the back, chest, and extremity swelling. Pt states symptoms began about 3 wks ago. States worsening. Shortness of breath is not exertional, it is constant, not worsened with laying down flat. States she has had dry cough. States pain in chest is intermittent, lasting few min at a time, not reproducible, non exertional. Did not try any medications for this.   Past Medical History  Diagnosis Date  . Trouble swallowing   . Change in voice   . Chest pain   . Abdominal pain   . Blood in stool   . Constipation   . Nausea   . Migraines   . Fatigue   . Gallstones 04/10/2011    with biliary pancreatitis  . Dysrhythmia   . Sleep apnea     cpap  . GERD (gastroesophageal reflux disease)   . Anemia     hx of anemic  . Depression   . MRSA carrier     Past Surgical History  Procedure Laterality Date  . Nasal polyp surgery      polyp from vocal cords  . Wisdom tooth extraction    . Laparoscopic cholecystectomy w/ cholangiography  04/25/2011    Dr Jamey Ripa  . Cholecystectomy    . Dilation and curettage of uterus    . Esophagogastroduodenoscopy  06/02/2011    Procedure: ESOPHAGOGASTRODUODENOSCOPY (EGD);  Surgeon: Louis Meckel, MD;  Location: [facility];  Service: Endoscopy;  Laterality: N/A;  . Bravo ph study  06/02/2011    Procedure: BRAVO PH STUDY;  Surgeon: Louis Meckel, MD;  Location: [facility];  Service: Endoscopy;  Laterality: N/A;    Family History  Problem Relation Age of Onset  . Diabetes Mother   . Pancreatic cancer Mother   . Lung cancer Father   .  Anesthesia problems Neg Hx   . Hypotension Neg Hx   . Malignant hyperthermia Neg Hx   . Pseudochol deficiency Neg Hx     History  Substance Use Topics  . Smoking status: Never Smoker   . Smokeless tobacco: Never Used  . Alcohol Use: No    OB History   Grav Para Term Preterm Abortions TAB SAB Ect Mult Living                  Review of Systems  Constitutional: Negative for fever and chills.  HENT: Positive for congestion. Negative for sore throat and neck pain.   Respiratory: Positive for cough, chest tightness and shortness of breath. Negative for wheezing.   Cardiovascular: Positive for chest pain and leg swelling. Negative for palpitations.  Gastrointestinal: Negative.   Genitourinary: Negative for dysuria and flank pain.  Musculoskeletal: Negative.   Skin: Negative.   Neurological: Negative.   All other systems reviewed and are negative.    Allergies  Review of patient's allergies indicates no known allergies.  Home Medications   Current Outpatient Rx  Name  Route  Sig  Dispense  Refill  . benztropine (COGENTIN) 1 MG tablet   Oral   Take 1 mg by mouth 2 (two) times daily.         Marland Kitchen  citalopram (CELEXA) 20 MG tablet   Oral   Take 20 mg by mouth at bedtime.         . hydrochlorothiazide (HYDRODIURIL) 25 MG tablet   Oral   Take 25 mg by mouth daily.         . naproxen (NAPROSYN) 500 MG tablet   Oral   Take 500 mg by mouth 2 (two) times daily with a meal.         . omeprazole (PRILOSEC) 20 MG capsule   Oral   Take 20 mg by mouth daily.         . paliperidone (INVEGA) 6 MG 24 hr tablet   Oral   Take 6 mg by mouth daily.         Marland Kitchen terbinafine (LAMISIL) 250 MG tablet   Oral   Take 250 mg by mouth daily.         Marland Kitchen topiramate (TOPAMAX) 100 MG tablet   Oral   Take 100 mg by mouth daily.           BP 115/63  Pulse 66  Temp(Src) 97.5 F (36.4 C) (Oral)  Resp 14  SpO2 100%  Physical Exam  Nursing note and vitals  reviewed. Constitutional: She is oriented to person, place, and time. She appears well-developed and well-nourished. No distress.  Eyes: Conjunctivae are normal.  Neck: Neck supple.  Cardiovascular: Normal rate, regular rhythm and normal heart sounds.   Pulmonary/Chest: Effort normal and breath sounds normal. No respiratory distress. She has no wheezes. She has no rales. She exhibits no tenderness.  Abdominal: Soft. Bowel sounds are normal. She exhibits no distension. There is no tenderness. There is no rebound and no guarding.  Musculoskeletal: She exhibits edema.  Non pitting bilateral UE and LE swelling. No erythema or tenderness  Neurological: She is alert and oriented to person, place, and time.  Skin: Skin is warm and dry.  Psychiatric: She has a normal mood and affect.    ED Course  Procedures (including critical care time)  Results for orders placed during the hospital encounter of 12/20/12  CBC WITH DIFFERENTIAL      Result Value Range   WBC 8.7  4.0 - 10.5 K/uL   RBC 4.59  3.87 - 5.11 MIL/uL   Hemoglobin 12.6  12.0 - 15.0 g/dL   HCT 19.1  47.8 - 29.5 %   MCV 80.2  78.0 - 100.0 fL   MCH 27.5  26.0 - 34.0 pg   MCHC 34.2  30.0 - 36.0 g/dL   RDW 62.1  30.8 - 65.7 %   Platelets 206  150 - 400 K/uL   Neutrophils Relative % 48  43 - 77 %   Neutro Abs 4.2  1.7 - 7.7 K/uL   Lymphocytes Relative 27  12 - 46 %   Lymphs Abs 2.3  0.7 - 4.0 K/uL   Monocytes Relative 9  3 - 12 %   Monocytes Absolute 0.8  0.1 - 1.0 K/uL   Eosinophils Relative 17 (*) 0 - 5 %   Eosinophils Absolute 1.4 (*) 0.0 - 0.7 K/uL   Basophils Relative 1  0 - 1 %   Basophils Absolute 0.1  0.0 - 0.1 K/uL  PRO B NATRIURETIC PEPTIDE      Result Value Range   Pro B Natriuretic peptide (BNP) <5.0  0 - 125 pg/mL  BASIC METABOLIC PANEL      Result Value Range   Sodium 139  135 -  145 mEq/L   Potassium 3.2 (*) 3.5 - 5.1 mEq/L   Chloride 102  96 - 112 mEq/L   CO2 26  19 - 32 mEq/L   Glucose, Bld 94  70 - 99 mg/dL    BUN 16  6 - 23 mg/dL   Creatinine, Ser 5.62  0.50 - 1.10 mg/dL   Calcium 9.3  8.4 - 13.0 mg/dL   GFR calc non Af Amer 74 (*) >90 mL/min   GFR calc Af Amer 86 (*) >90 mL/min   Dg Chest 2 View  12/20/2012   *RADIOLOGY REPORT*  Clinical Data: Shortness of breath.  Mid chest pain.  CHEST - 2 VIEW  Comparison: 04/06/2011  Findings: Mild cardiomegaly noted with cardiothoracic index 51%, partially accounted for by the low lung volumes.  Minimal nodularity, left upper lobe, likely vascular.  The lungs appear grossly clear.  Mild thoracic spondylosis.  No pleural effusion.  IMPRESSION:  1.  Borderline cardiomegaly, possibly due to the low lung volumes. Otherwise negative.   Original Report Authenticated By: Gaylyn Rong, M.D.      1. Shortness of breath       MDM  Pt with shortness of breath, peripheral swelling, which she states is chronic. Labs in ED unremarkable other than mildly low potassium of 3.2. Cp atypical. No hx of CAD. No risk factors for ACS. PERC negative. CXR negative. Suspect bronchitis vs GERD. Will try inhaler as well as pepcid daily, already taking prilosec. Follow up with pcp for recheck.   Filed Vitals:   12/20/12 1045 12/20/12 1145 12/20/12 1215 12/20/12 1230  BP: 115/52 110/62 110/55 113/59  Pulse: 57 62 63 77  Temp:      TempSrc:      Resp: 22 14 15 19   SpO2: 100% 99% 99% 99%           Lottie Mussel, PA-C 12/20/12 1630

## 2012-12-20 NOTE — ED Notes (Signed)
Pt states that she has been having SOB for 2-3 weeks. Pt states that she has a dry cough. Pt reports chest pain that started this morning. Pt states that pain occurs intermittently and lasts "a few minutes". Pt states that pain occurs anytime regardless of movement or breathing.

## 2013-05-10 ENCOUNTER — Emergency Department (HOSPITAL_COMMUNITY)
Admission: EM | Admit: 2013-05-10 | Discharge: 2013-05-11 | Disposition: A | Discharge status: Other Acute Inpatient Hospital | Payer: Medicaid Other | Attending: Emergency Medicine | Admitting: Emergency Medicine

## 2013-05-10 ENCOUNTER — Encounter (HOSPITAL_COMMUNITY): Payer: Self-pay | Admitting: Emergency Medicine

## 2013-05-10 DIAGNOSIS — Z79899 Other long term (current) drug therapy: Secondary | ICD-10-CM | POA: Insufficient documentation

## 2013-05-10 DIAGNOSIS — F329 Major depressive disorder, single episode, unspecified: Secondary | ICD-10-CM

## 2013-05-10 DIAGNOSIS — J45909 Unspecified asthma, uncomplicated: Secondary | ICD-10-CM | POA: Insufficient documentation

## 2013-05-10 DIAGNOSIS — F3130 Bipolar disorder, current episode depressed, mild or moderate severity, unspecified: Secondary | ICD-10-CM | POA: Insufficient documentation

## 2013-05-10 DIAGNOSIS — G43909 Migraine, unspecified, not intractable, without status migrainosus: Secondary | ICD-10-CM | POA: Insufficient documentation

## 2013-05-10 DIAGNOSIS — Z3202 Encounter for pregnancy test, result negative: Secondary | ICD-10-CM | POA: Insufficient documentation

## 2013-05-10 DIAGNOSIS — Z9089 Acquired absence of other organs: Secondary | ICD-10-CM | POA: Insufficient documentation

## 2013-05-10 DIAGNOSIS — R443 Hallucinations, unspecified: Secondary | ICD-10-CM | POA: Insufficient documentation

## 2013-05-10 DIAGNOSIS — K219 Gastro-esophageal reflux disease without esophagitis: Secondary | ICD-10-CM | POA: Insufficient documentation

## 2013-05-10 DIAGNOSIS — Z8679 Personal history of other diseases of the circulatory system: Secondary | ICD-10-CM | POA: Insufficient documentation

## 2013-05-10 DIAGNOSIS — F32A Depression, unspecified: Secondary | ICD-10-CM

## 2013-05-10 DIAGNOSIS — G4730 Sleep apnea, unspecified: Secondary | ICD-10-CM | POA: Insufficient documentation

## 2013-05-10 DIAGNOSIS — Z8614 Personal history of Methicillin resistant Staphylococcus aureus infection: Secondary | ICD-10-CM | POA: Insufficient documentation

## 2013-05-10 DIAGNOSIS — Z9981 Dependence on supplemental oxygen: Secondary | ICD-10-CM | POA: Insufficient documentation

## 2013-05-10 HISTORY — DX: Post-traumatic stress disorder, unspecified: F43.10

## 2013-05-10 HISTORY — DX: Acute pancreatitis without necrosis or infection, unspecified: K85.90

## 2013-05-10 HISTORY — DX: Unspecified asthma, uncomplicated: J45.909

## 2013-05-10 HISTORY — DX: Bipolar disorder, unspecified: F31.9

## 2013-05-10 LAB — CBC WITH DIFFERENTIAL/PLATELET
Basophils Absolute: 0.1 10*3/uL (ref 0.0–0.1)
Basophils Relative: 1 % (ref 0–1)
Eosinophils Relative: 3 % (ref 0–5)
HCT: 37.9 % (ref 36.0–46.0)
MCHC: 34.6 g/dL (ref 30.0–36.0)
MCV: 83.5 fL (ref 78.0–100.0)
Monocytes Absolute: 1.1 10*3/uL — ABNORMAL HIGH (ref 0.1–1.0)
Monocytes Relative: 11 % (ref 3–12)
RDW: 14.1 % (ref 11.5–15.5)

## 2013-05-10 LAB — ETHANOL: Alcohol, Ethyl (B): <11 mg/dL (ref 0–11)

## 2013-05-10 LAB — PREGNANCY, URINE: Preg Test, Ur: NEGATIVE

## 2013-05-10 LAB — RAPID URINE DRUG SCREEN, HOSP PERFORMED
Benzodiazepines: NOT DETECTED
Cocaine: NOT DETECTED
Opiates: NOT DETECTED

## 2013-05-10 LAB — COMPREHENSIVE METABOLIC PANEL
AST: 22 U/L (ref 0–37)
Albumin: 3.8 g/dL (ref 3.5–5.2)
BUN: 9 mg/dL (ref 6–23)
CO2: 29 mEq/L (ref 19–32)
Calcium: 9.2 mg/dL (ref 8.4–10.5)
Creatinine, Ser: 0.72 mg/dL (ref 0.50–1.10)
GFR calc non Af Amer: >90 mL/min (ref >90)

## 2013-05-10 MED ORDER — ACETAMINOPHEN 325 MG PO TABS: 650.0000 mg | ORAL_TABLET | ORAL | Status: DC | PRN

## 2013-05-10 MED ORDER — PANTOPRAZOLE SODIUM 40 MG PO TBEC
40.0000 mg | DELAYED_RELEASE_TABLET | Freq: Every day | ORAL | Status: DC
  Administered 2013-05-11: 40 mg via ORAL
  Filled 2013-05-10: qty 1

## 2013-05-10 MED ORDER — NICOTINE 21 MG/24HR TD PT24
21.0000 mg | MEDICATED_PATCH | Freq: Every day | TRANSDERMAL | Status: DC
  Filled 2013-05-10 (×2): qty 1

## 2013-05-10 MED ORDER — IBUPROFEN 400 MG PO TABS: 600.0000 mg | ORAL_TABLET | Freq: Three times a day (TID) | ORAL | Status: DC | PRN

## 2013-05-10 MED ORDER — LORAZEPAM 1 MG PO TABS: 1.0000 mg | ORAL_TABLET | Freq: Three times a day (TID) | ORAL | Status: DC | PRN

## 2013-05-10 MED ORDER — ZOLPIDEM TARTRATE 5 MG PO TABS: 5.0000 mg | ORAL_TABLET | Freq: Every evening | ORAL | Status: DC | PRN

## 2013-05-10 MED ORDER — TOPIRAMATE 25 MG PO TABS
100.0000 mg | ORAL_TABLET | Freq: Every day | ORAL | Status: DC
  Administered 2013-05-11: 100 mg via ORAL
  Filled 2013-05-10: qty 4

## 2013-05-10 MED ORDER — PROPRANOLOL HCL 20 MG PO TABS
20.0000 mg | ORAL_TABLET | Freq: Two times a day (BID) | ORAL | Status: DC | PRN
  Filled 2013-05-10: qty 1

## 2013-05-10 MED ORDER — CITALOPRAM HYDROBROMIDE 10 MG PO TABS
20.0000 mg | ORAL_TABLET | Freq: Every day | ORAL | Status: DC
  Administered 2013-05-11: 20 mg via ORAL
  Filled 2013-05-10: qty 2

## 2013-05-10 MED ORDER — BENZTROPINE MESYLATE 1 MG PO TABS
1.0000 mg | ORAL_TABLET | Freq: Two times a day (BID) | ORAL | Status: DC
  Administered 2013-05-11 (×2): 1 mg via ORAL
  Filled 2013-05-10 (×2): qty 1

## 2013-05-10 MED ORDER — HYDROCHLOROTHIAZIDE 25 MG PO TABS
25.0000 mg | ORAL_TABLET | Freq: Every day | ORAL | Status: DC
  Administered 2013-05-11: 25 mg via ORAL
  Filled 2013-05-10: qty 1

## 2013-05-10 MED ORDER — ONDANSETRON HCL 4 MG PO TABS: 4.0000 mg | ORAL_TABLET | Freq: Three times a day (TID) | ORAL | Status: DC | PRN

## 2013-05-10 MED ORDER — ALUM & MAG HYDROXIDE-SIMETH 200-200-20 MG/5ML PO SUSP: 30.0000 mL | ORAL | Status: DC | PRN

## 2013-05-10 MED ORDER — PALIPERIDONE ER 6 MG PO TB24
6.0000 mg | ORAL_TABLET | Freq: Every day | ORAL | Status: DC
  Administered 2013-05-11: 6 mg via ORAL
  Filled 2013-05-10: qty 1

## 2013-05-10 NOTE — ED Provider Notes (Signed)
CSN: [id number]     Arrival date & time 05/10/13  2122 History   First MD Initiated Contact with Patient 05/10/13 2207     Chief Complaint  Patient presents with  . Suicidal   (Consider location/radiation/quality/duration/timing/severity/associated sxs/prior Treatment) The history is provided by the patient and medical records.   This is a 42 year old female with past medical history significant for depression, bipolar disorder, PTSD, presenting to the ED for suicidal ideation. Patient states she's had recurrent thoughts of suicide for several months, but states they are occuring more frequently noq. She expressed these thoughts to her therapist earlier today who referred her to the ED for further evaluation. Patient states whenever she thinks about suicide, she sees an image of herself slitting her wrists with a knife.  Patient also admits to some auditory hallucinations, states it is a voice she has heard several times before but he did not tell her anything specific. Denies any visual hallucinations. Denies homicidal ideation.  Denies any alcohol or illicit drug use. Patient has been in therapy for several months but states she does not feel like it is helping her. Has never completed inpatient psychiatric treatment but states willing to do so if it will help her.  No other complaints at this time.  Past Medical History  Diagnosis Date  . Trouble swallowing   . Change in voice   . Chest pain   . Abdominal pain   . Blood in stool   . Constipation   . Nausea   . Migraines   . Fatigue   . Gallstones 04/10/2011    with biliary pancreatitis  . Dysrhythmia   . Sleep apnea     cpap  . GERD (gastroesophageal reflux disease)   . Anemia     hx of anemic  . Depression   . MRSA carrier   . Pancreatitis   . Bipolar 1 disorder   . PTSD (post-traumatic stress disorder)   . Asthma    Past Surgical History  Procedure Laterality Date  . Nasal polyp surgery      polyp from vocal cords  .  Wisdom tooth extraction    . Laparoscopic cholecystectomy w/ cholangiography  04/25/2011    Dr Jamey Ripa  . Cholecystectomy    . Dilation and curettage of uterus    . Esophagogastroduodenoscopy  06/02/2011    Procedure: ESOPHAGOGASTRODUODENOSCOPY (EGD);  Surgeon: Louis Meckel, MD;  Location: [facility];  Service: Endoscopy;  Laterality: N/A;  . Bravo ph study  06/02/2011    Procedure: BRAVO PH STUDY;  Surgeon: Louis Meckel, MD;  Location: [facility];  Service: Endoscopy;  Laterality: N/A;   Family History  Problem Relation Age of Onset  . Diabetes Mother   . Pancreatic cancer Mother   . Lung cancer Father   . Anesthesia problems Neg Hx   . Hypotension Neg Hx   . Malignant hyperthermia Neg Hx   . Pseudochol deficiency Neg Hx    History  Substance Use Topics  . Smoking status: Never Smoker   . Smokeless tobacco: Never Used  . Alcohol Use: No   OB History   Grav Para Term Preterm Abortions TAB SAB Ect Mult Living                 Review of Systems  Psychiatric/Behavioral: Positive for suicidal ideas.  All other systems reviewed and are negative.    Allergies  Review of patient's allergies indicates no known allergies.  Home Medications   Current  Outpatient Rx  Name  Route  Sig  Dispense  Refill  . benztropine (COGENTIN) 1 MG tablet   Oral   Take 1 mg by mouth 2 (two) times daily.         . citalopram (CELEXA) 20 MG tablet   Oral   Take 20 mg by mouth at bedtime.         . hydrochlorothiazide (HYDRODIURIL) 25 MG tablet   Oral   Take 25 mg by mouth daily.         Marland Kitchen omeprazole (PRILOSEC) 20 MG capsule   Oral   Take 20 mg by mouth daily.         . paliperidone (INVEGA) 6 MG 24 hr tablet   Oral   Take 6 mg by mouth daily.         . propranolol (INDERAL) 20 MG tablet   Oral   Take 20 mg by mouth 2 (two) times daily as needed.         . topiramate (TOPAMAX) 100 MG tablet   Oral   Take 100 mg by mouth daily.          BP 114/75  Pulse  55  Temp(Src) 99 F (37.2 C) (Oral)  Resp 16  Wt 276 lb 10.8 oz (125.5 kg)  BMI 43.32 kg/m2  SpO2 100%  LMP 03/18/2013  Physical Exam  Nursing note and vitals reviewed. Constitutional: She is oriented to person, place, and time. She appears well-developed and well-nourished.  HENT:  Head: Normocephalic and atraumatic.  Mouth/Throat: Oropharynx is clear and moist.  Eyes: Conjunctivae and EOM are normal. Pupils are equal, round, and reactive to light.  Neck: Normal range of motion.  Cardiovascular: Normal rate, regular rhythm and normal heart sounds.   Pulmonary/Chest: Effort normal and breath sounds normal.  Abdominal: Soft. Bowel sounds are normal.  Musculoskeletal: Normal range of motion.  Neurological: She is alert and oriented to person, place, and time.  Skin: Skin is warm and dry.  Psychiatric: Her speech is normal. She is actively hallucinating. Thought content is not delusional. She exhibits a depressed mood. She expresses suicidal ideation. She expresses no homicidal ideation. She expresses suicidal plans. She expresses no homicidal plans.  Depressed mood, flat affect, suicidal thoughts with plan, auditory hallucinations; denies HI or visual hallucinations    ED Course  Procedures (including critical care time) Labs Review Labs Reviewed  CBC WITH DIFFERENTIAL - Abnormal; Notable for the following:    Lymphs Abs 4.2 (*)    Monocytes Absolute 1.1 (*)    All other components within normal limits  COMPREHENSIVE METABOLIC PANEL - Abnormal; Notable for the following:    Total Bilirubin 0.2 (*)    All other components within normal limits  ETHANOL  URINE RAPID DRUG SCREEN (HOSP PERFORMED)  PREGNANCY, URINE   Imaging Review No results found.  EKG Interpretation   None       MDM  No diagnosis found.  Labs as above, largely WNL.  UDS negative.  Pt medically cleared.  Moved to psych holding area with temp holding orders and home meds placed.  TTS consulted, they  will see her.  Garlon Hatchet, PA-C 05/11/13 [phone]

## 2013-05-10 NOTE — ED Notes (Signed)
Pt st's she is currently being treated for anxiety, depression, PTSD, and bipolar disorder.  Pt st's she was told to come here by her therapist.  Pt st's "I am just tired".  Pt has 2 small children who are with a friend at this time.  Pt cooperative.

## 2013-05-10 NOTE — ED Notes (Signed)
NURSE FIRST NOTIFIED CHARGE NURSE FOR SITTER AND SECURITY TO WAND PT. PAPER SCRUBS GIVEN TO PT.

## 2013-05-10 NOTE — ED Notes (Signed)
PT. REPORTS SUICIDAL IDEATION TODAY - PLANS TO CUT WRIST WITH A KNIFE .

## 2013-05-11 ENCOUNTER — Inpatient Hospital Stay (HOSPITAL_COMMUNITY)
Admission: AD | Admit: 2013-05-11 | Discharge: 2013-05-15 | DRG: 885 | Disposition: A | Discharge status: Home / Self Care | Payer: Medicaid Other | Source: Intra-hospital | Attending: Psychiatry | Admitting: Psychiatry

## 2013-05-11 ENCOUNTER — Encounter (HOSPITAL_COMMUNITY): Payer: Self-pay | Admitting: Behavioral Health

## 2013-05-11 DIAGNOSIS — F3130 Bipolar disorder, current episode depressed, mild or moderate severity, unspecified: Secondary | ICD-10-CM | POA: Diagnosis present

## 2013-05-11 DIAGNOSIS — F259 Schizoaffective disorder, unspecified: Principal | ICD-10-CM | POA: Diagnosis present

## 2013-05-11 DIAGNOSIS — F439 Reaction to severe stress, unspecified: Secondary | ICD-10-CM | POA: Diagnosis present

## 2013-05-11 DIAGNOSIS — F4310 Post-traumatic stress disorder, unspecified: Secondary | ICD-10-CM | POA: Diagnosis present

## 2013-05-11 DIAGNOSIS — K219 Gastro-esophageal reflux disease without esophagitis: Secondary | ICD-10-CM | POA: Diagnosis present

## 2013-05-11 DIAGNOSIS — Z79899 Other long term (current) drug therapy: Secondary | ICD-10-CM

## 2013-05-11 DIAGNOSIS — R45851 Suicidal ideations: Secondary | ICD-10-CM

## 2013-05-11 DIAGNOSIS — R1084 Generalized abdominal pain: Secondary | ICD-10-CM

## 2013-05-11 MED ORDER — INFLUENZA VAC SPLIT QUAD 0.5 ML IM SUSP
0.5000 mL | INTRAMUSCULAR | Status: AC
  Administered 2013-05-13: 0.5 mL via INTRAMUSCULAR
  Filled 2013-05-11: qty 0.5

## 2013-05-11 MED ORDER — PANTOPRAZOLE SODIUM 40 MG PO TBEC
40.0000 mg | DELAYED_RELEASE_TABLET | Freq: Every day | ORAL | Status: DC
  Administered 2013-05-12 – 2013-05-15 (×4): 40 mg via ORAL
  Filled 2013-05-11 (×6): qty 1

## 2013-05-11 MED ORDER — CITALOPRAM HYDROBROMIDE 20 MG PO TABS
20.0000 mg | ORAL_TABLET | Freq: Every day | ORAL | Status: DC
  Administered 2013-05-11: 20 mg via ORAL
  Filled 2013-05-11 (×3): qty 1

## 2013-05-11 MED ORDER — IBUPROFEN 600 MG PO TABS
600.0000 mg | ORAL_TABLET | Freq: Three times a day (TID) | ORAL | Status: DC | PRN
  Administered 2013-05-11: 600 mg via ORAL
  Filled 2013-05-11: qty 1

## 2013-05-11 MED ORDER — HYDROXYZINE HCL 25 MG PO TABS
25.0000 mg | ORAL_TABLET | Freq: Three times a day (TID) | ORAL | Status: DC | PRN
  Administered 2013-05-11 – 2013-05-14 (×4): 25 mg via ORAL
  Filled 2013-05-11 (×5): qty 1

## 2013-05-11 MED ORDER — PNEUMOCOCCAL VAC POLYVALENT 25 MCG/0.5ML IJ INJ
0.5000 mL | INJECTION | INTRAMUSCULAR | Status: AC
  Administered 2013-05-13: 0.5 mL via INTRAMUSCULAR

## 2013-05-11 MED ORDER — PROPRANOLOL HCL 10 MG PO TABS: 20.0000 mg | ORAL_TABLET | Freq: Two times a day (BID) | ORAL | Status: DC | PRN

## 2013-05-11 MED ORDER — MAGNESIUM HYDROXIDE 400 MG/5ML PO SUSP: 30.0000 mL | Freq: Every day | ORAL | Status: DC | PRN

## 2013-05-11 MED ORDER — BENZTROPINE MESYLATE 1 MG PO TABS
1.0000 mg | ORAL_TABLET | Freq: Two times a day (BID) | ORAL | Status: DC
  Administered 2013-05-11 – 2013-05-15 (×8): 1 mg via ORAL
  Filled 2013-05-11 (×14): qty 1

## 2013-05-11 MED ORDER — HYDROCHLOROTHIAZIDE 25 MG PO TABS
25.0000 mg | ORAL_TABLET | Freq: Every day | ORAL | Status: DC
  Administered 2013-05-12 – 2013-05-15 (×4): 25 mg via ORAL
  Filled 2013-05-11 (×6): qty 1

## 2013-05-11 MED ORDER — ALUM & MAG HYDROXIDE-SIMETH 200-200-20 MG/5ML PO SUSP: 30.0000 mL | ORAL | Status: DC | PRN

## 2013-05-11 MED ORDER — TOPIRAMATE 100 MG PO TABS
100.0000 mg | ORAL_TABLET | Freq: Every day | ORAL | Status: DC
  Administered 2013-05-12 – 2013-05-15 (×4): 100 mg via ORAL
  Filled 2013-05-11 (×6): qty 1

## 2013-05-11 MED ORDER — PALIPERIDONE ER 6 MG PO TB24
6.0000 mg | ORAL_TABLET | Freq: Every day | ORAL | Status: DC
  Administered 2013-05-12 – 2013-05-15 (×4): 6 mg via ORAL
  Filled 2013-05-11 (×6): qty 1

## 2013-05-11 MED ORDER — ACETAMINOPHEN 325 MG PO TABS: 650.0000 mg | ORAL_TABLET | Freq: Four times a day (QID) | ORAL | Status: DC | PRN

## 2013-05-11 NOTE — [facility] Counselor (Signed)
Per Shawn AD at [facility], pt has been accepted to [address] by Dr. Lucianne Muss. There are no available beds on [address], and pt's RN will be notified as soon as bed is ready. Tori RN at TTS called charge RN Clydie Braun to give her update.  Evette Cristal, [place] Assessment Counselor

## 2013-05-11 NOTE — ED Notes (Signed)
[facility], they are aware of need for transporting to [facility]. Report they expect to have someone out to our facility in approx 25-30 mins.
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
## 2013-05-11 NOTE — ED Provider Notes (Addendum)
Patient offers no complaint. She is resting in bed states she is comfortable. Alert awake no distress  Doug Sou, MD 05/11/13 [phone] 4 5 PM patient is alert Glasgow Coma Score 15 ambulatory without difficulty. Stable for transfer to [facility]. Dr Dub Mikes accepting MD Results for orders placed during the hospital encounter of 05/10/13  ETHANOL      Result Value Range   Alcohol, Ethyl (B) <11  0 - 11 mg/dL  URINE RAPID DRUG SCREEN (HOSP PERFORMED)      Result Value Range   Opiates NONE DETECTED  NONE DETECTED   Cocaine NONE DETECTED  NONE DETECTED   Benzodiazepines NONE DETECTED  NONE DETECTED   Amphetamines NONE DETECTED  NONE DETECTED   Tetrahydrocannabinol NONE DETECTED  NONE DETECTED   Barbiturates NONE DETECTED  NONE DETECTED  CBC WITH DIFFERENTIAL      Result Value Range   WBC 10.2  4.0 - 10.5 K/uL   RBC 4.54  3.87 - 5.11 MIL/uL   Hemoglobin 13.1  12.0 - 15.0 g/dL   HCT 96.0  45.4 - 09.8 %   MCV 83.5  78.0 - 100.0 fL   MCH 28.9  26.0 - 34.0 pg   MCHC 34.6  30.0 - 36.0 g/dL   RDW 11.9  14.7 - 82.9 %   Platelets 256  150 - 400 K/uL   Neutrophils Relative % 44  43 - 77 %   Neutro Abs 4.5  1.7 - 7.7 K/uL   Lymphocytes Relative 41  12 - 46 %   Lymphs Abs 4.2 (*) 0.7 - 4.0 K/uL   Monocytes Relative 11  3 - 12 %   Monocytes Absolute 1.1 (*) 0.1 - 1.0 K/uL   Eosinophils Relative 3  0 - 5 %   Eosinophils Absolute 0.3  0.0 - 0.7 K/uL   Basophils Relative 1  0 - 1 %   Basophils Absolute 0.1  0.0 - 0.1 K/uL  COMPREHENSIVE METABOLIC PANEL      Result Value Range   Sodium 142  135 - 145 mEq/L   Potassium 4.0  3.5 - 5.1 mEq/L   Chloride 104  96 - 112 mEq/L   CO2 29  19 - 32 mEq/L   Glucose, Bld 83  70 - 99 mg/dL   BUN 9  6 - 23 mg/dL   Creatinine, Ser 5.62  0.50 - 1.10 mg/dL   Calcium 9.2  8.4 - 13.0 mg/dL   Total Protein 7.8  6.0 - 8.3 g/dL   Albumin 3.8  3.5 - 5.2 g/dL   AST 22  0 - 37 U/L   ALT 29  0 - 35 U/L   Alkaline Phosphatase 45  39 - 117 U/L   Total Bilirubin 0.2 (*) 0.3  - 1.2 mg/dL   GFR calc non Af Amer >90  >90 mL/min   GFR calc Af Amer >90  >90 mL/min  PREGNANCY, URINE      Result Value Range   Preg Test, Ur NEGATIVE  NEGATIVE   No results found.   Doug Sou, MD 05/11/13 [phone]

## 2013-05-11 NOTE — ED Notes (Signed)
[facility] arrived to transport pt to [facility] 

## 2013-05-11 NOTE — Progress Notes (Signed)
Lisa [name], MHT was requested by Deitra Mayo, TTS to complete support paper work with patient who has been accepted to [facility] by Dr. Carloyn Manner to Dr. Dub Mikes assigned to [address]. Writer completed support paper work with patient and explained program rules. Support paper work has been faxed to [facility] and originals provided to attending nurse to give to [name]. Nurse has arranged for patient transfer by contacting [facility].

## 2013-05-11 NOTE — ED Notes (Addendum)
Pt's partner came to pick up the car, and car keys. Also took home pt's wallet, 2 telephones. Phone charger, headset, keys and meds at the request of patient. Maida Sale-- partner [phone].
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
## 2013-05-11 NOTE — ED Notes (Signed)
Dr. J at bedside 

## 2013-05-11 NOTE — Tx Team (Signed)
Initial Interdisciplinary Treatment Plan  PATIENT STRENGTHS: (choose at least two) Ability for insight Capable of independent living Communication skills General fund of knowledge Motivation for treatment/growth  PATIENT STRESSORS: Financial difficulties Health problems Marital or family conflict Occupational concerns   PROBLEM LIST: Problem List/Patient Goals Date to be addressed Date deferred Reason deferred Estimated date of resolution  Depression                                                       DISCHARGE CRITERIA:  Ability to meet basic life and health needs Improved stabilization in mood, thinking, and/or behavior Motivation to continue treatment in a less acute level of care  PRELIMINARY DISCHARGE PLAN: Attend PHP/IOP Outpatient therapy Participate in family therapy  PATIENT/FAMIILY INVOLVEMENT: This treatment plan has been presented to and reviewed with the patient, Lisa [name].  The patient and family have been given the opportunity to ask questions and make suggestions.  Melanee Spry 05/11/2013, 6:25 PM

## 2013-05-11 NOTE — BH Assessment (Addendum)
[facility] Progress Note  Per Thurman Coyer, RN, [facility], pt accepted to [facility] to the service of Geoffery Lyons, MD, Rm [id number].  At 15:47 I called Wynetta Emery, MHT and notified him.  At 15:56 I called EDP Dr Ethelda Chick, notifying him as well.  Doylene Canning, MA Triage Specialist 05/11/13 @ 15:59

## 2013-05-12 ENCOUNTER — Encounter (HOSPITAL_COMMUNITY): Payer: Self-pay | Admitting: Psychiatry

## 2013-05-12 DIAGNOSIS — F4310 Post-traumatic stress disorder, unspecified: Secondary | ICD-10-CM | POA: Diagnosis present

## 2013-05-12 DIAGNOSIS — F259 Schizoaffective disorder, unspecified: Secondary | ICD-10-CM | POA: Diagnosis present

## 2013-05-12 DIAGNOSIS — F439 Reaction to severe stress, unspecified: Secondary | ICD-10-CM | POA: Diagnosis present

## 2013-05-12 DIAGNOSIS — F411 Generalized anxiety disorder: Secondary | ICD-10-CM

## 2013-05-12 DIAGNOSIS — F3130 Bipolar disorder, current episode depressed, mild or moderate severity, unspecified: Secondary | ICD-10-CM | POA: Diagnosis present

## 2013-05-12 MED ORDER — CITALOPRAM HYDROBROMIDE 40 MG PO TABS
40.0000 mg | ORAL_TABLET | Freq: Every day | ORAL | Status: DC
  Administered 2013-05-12 – 2013-05-14 (×3): 40 mg via ORAL
  Filled 2013-05-12 (×5): qty 1
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
## 2013-05-12 NOTE — Progress Notes (Signed)
Patient did attend the evening karaoke group. Pt was engaged and supportive but did not participate by singing a song.    

## 2013-05-12 NOTE — Progress Notes (Signed)
D   Pt is depressed and sad   She attended karoke group and said that lifted her spirits   She reported some anxiety  A   Discussed pt programing on the 500 hall and gave her a schedule   Verbal support given  Medications administered and effectiveness monitored  Q 15 min checks R   Pt safe at present and verbalized understanding

## 2013-05-12 NOTE — Progress Notes (Signed)
D.  Pt pleasant on approach, appears depressed.  Positive for evening wrap up group.  Interacting appropriately within milieu.  Denies SI/HI/hallucinations at this time, but has had some passive SI during day.  Pt contracts for safety on unit.  A. Support and encouragement offered  R.  Pt remains safe on unit, will continue to monitor.

## 2013-05-12 NOTE — BHH Suicide Risk Assessment (Signed)
B[facility] INPATIENT:  Family/Significant Other Suicide Prevention Education  Suicide Prevention Education:  Education Completed; Lisa [name] (pt's friend/roomate) [phone] has been identified by the patient as the family member/significant other with whom the patient will be residing, and identified as the person(s) who will aid the patient in the event of a mental health crisis (suicidal ideations/suicide attempt).  With written consent from the patient, the family member/significant other has been provided the following suicide prevention education, prior to the and/or following the discharge of the patient.  The suicide prevention education provided includes the following:  Suicide risk factors  Suicide prevention and interventions  National Suicide Hotline telephone number  [facility] assessment telephone number  [facility] Emergency Assistance 911  [facility] and/or Residential Mobile Crisis Unit telephone number  Request made of family/significant other to:  Remove weapons (e.g., guns, rifles, knives), all items previously/currently identified as safety concern.    Remove drugs/medications (over-the-counter, prescriptions, illicit drugs), all items previously/currently identified as a safety concern.  The family member/significant other verbalizes understanding of the suicide prevention education information provided.  The family member/significant other agrees to remove the items of safety concern listed above.  Lisa [name], Lisa [name] 05/12/2013, 1:00 PM
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
## 2013-05-12 NOTE — BHH Group Notes (Signed)
[facility] LCSW Aftercare Discharge Planning Group Note   05/12/2013 9:29 AM  Participation Quality:  Appropriate   Mood/Affect:  Flat  Depression Rating:  5  Anxiety Rating:  5  Thoughts of Suicide:  No Will you contract for safety?   NA  Current AVH:  No  Plan for Discharge/Comments:  Pt reports that she lives with her friend and two children. She sees [facility] in [place] for therapy but does not have psychiatrist currently. She plans to return home at d/c. Pt denies SI currently.   Transportation Means: friend   Supports: friend/limited family supports   Counselling psychologist, Research scientist (physical sciences)
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
## 2013-05-12 NOTE — Progress Notes (Signed)
D: Patient denies SI/HI and A/V hallucinations;  reports ability to pay attention to; rates depression as 5/10; rates hopelessness 5/10; rates anxiety as 8/10  A: Monitored q 15 minutes; patient encouraged to attend groups; patient educated about medications; patient given medications per physician orders; patient encouraged to express feelings and/or concerns  R: Patient is very minimal; patient is flat and blunted and does not forward much information; patient is quiet, cooperative  ; patient's interaction with staff and peers is appropriate; patient was able to set goal to talk with staff 1:1 when having feelings of SI; patient is taking medications as prescribed and tolerating medications; patient is attending all groups and very cautious

## 2013-05-12 NOTE — [facility] Group Notes (Signed)
[facility] LCSW Group Therapy  05/12/2013 1:46 PM  Type of Therapy:  Group Therapy  Participation Level:  Minimal  Pt was called out by MD at beginning of group and did not return.  Smart, Lafayette Dunlevy 05/12/2013, 1:46 PM

## 2013-05-12 NOTE — ED Provider Notes (Signed)
Medical screening examination/treatment/procedure(s) were performed by non-physician practitioner and as supervising physician I was immediately available for consultation/collaboration.  EKG Interpretation   None         Audree Camel, MD 05/12/13 1720

## 2013-05-12 NOTE — [facility] Suicide Risk Assessment (Signed)
Suicide Risk Assessment  Admission Assessment     Nursing information obtained from:    Demographic factors:    Current Mental Status:    Loss Factors:    Historical Factors:    Risk Reduction Factors:     CLINICAL FACTORS:   Bipolar Disorder:   Depressive phase Schizoaffective  COGNITIVE FEATURES THAT CONTRIBUTE TO RISK:  Closed-mindedness Polarized thinking Thought constriction (tunnel vision)    SUICIDE RISK:   Moderate:  Frequent suicidal ideation with limited intensity, and duration, some specificity in terms of plans, no associated intent, good self-control, limited dysphoria/symptomatology, some risk factors present, and identifiable protective factors, including available and accessible social support.  PLAN OF CARE: Supportive approach/coping skills                               CBT/mindfulnes/stress management                               Optimize treatment with psychotropics  I certify that inpatient services furnished can reasonably be expected to improve the patient's condition.  Dayjah Selman A 05/12/2013, 5:47 PM

## 2013-05-13 DIAGNOSIS — F259 Schizoaffective disorder, unspecified: Principal | ICD-10-CM

## 2013-05-13 NOTE — Progress Notes (Signed)
[facility] MD Progress Note  05/13/2013 11:38 AM Lisa [name]  MRN:  [id number] Subjective:  Continues to be down, endorses voices but not commanding. Flat affect and guarded. Did attend the groups. Says medications would take time but seemingly depressed. Diagnosis:   DSM5: Schizophrenia Disorders:  Schizoaffective disorder Obsessive-Compulsive Disorders:   Trauma-Stressor Disorders:   Substance/Addictive Disorders:   Depressive Disorders:  Disruptive Mood Dysregulation Disorder (296.99)  Axis I: Schizoaffective Disorder  ADL's:  Intact  Sleep: Fair  Appetite:  Fair  Suicidal Ideation:  Plan:  no  Intent:  no Homicidal Ideation:  Plan:  no  Intent:  no AEB (as evidenced by):  Psychiatric Specialty Exam: Review of Systems  Psychiatric/Behavioral: Positive for depression and hallucinations. The patient has insomnia.     Blood pressure 100/69, pulse 106, temperature 97.4 F (36.3 C), temperature source Oral, resp. rate 17, height 5' 7.5" (1.715 m), weight 122.471 kg (270 lb), last menstrual period 03/18/2013.Body mass index is 41.64 kg/(m^2).  General Appearance: Casual  Eye Contact::  Fair  Speech:  Slow  Volume:  Decreased  Mood:  Dysphoric  Affect:  Constricted  Thought Process:  Disorganized  Orientation:  Full (Time, Place, and Person)  Thought Content:  Paranoia and hallucinations  Suicidal Thoughts:  No  Homicidal Thoughts:  No  Memory:  Recent;   Fair  Judgement:  Impaired  Insight:  Lacking  Psychomotor Activity:  Decreased  Concentration:  Fair  Recall:  Fair  Akathisia:  No  Handed:  Right  AIMS (if indicated):     Assets:  Desire for Improvement Resilience Social Support  Sleep:  Number of Hours: 6.75   Current Medications: Current Facility-Administered Medications  Medication Dose Route Frequency Provider Last Rate Last Dose  . acetaminophen (TYLENOL) tablet 650 mg  650 mg Oral Q6H PRN Nelly Rout, MD      . alum & mag hydroxide-simeth  (MAALOX/MYLANTA) 200-200-20 MG/5ML suspension 30 mL  30 mL Oral Q4H PRN Nelly Rout, MD      . benztropine (COGENTIN) tablet 1 mg  1 mg Oral BID Nelly Rout, MD   1 mg at 05/13/13 0850  . citalopram (CELEXA) tablet 40 mg  40 mg Oral QHS Rachael Fee, MD   40 mg at 05/12/13 2204  . hydrochlorothiazide (HYDRODIURIL) tablet 25 mg  25 mg Oral Daily Nelly Rout, MD   25 mg at 05/13/13 0850  . hydrOXYzine (ATARAX/VISTARIL) tablet 25 mg  25 mg Oral TID PRN Nelly Rout, MD   25 mg at 05/12/13 2204  . ibuprofen (ADVIL,MOTRIN) tablet 600 mg  600 mg Oral Q8H PRN Nelly Rout, MD   600 mg at 05/11/13 2238  . magnesium hydroxide (MILK OF MAGNESIA) suspension 30 mL  30 mL Oral Daily PRN Nelly Rout, MD      . paliperidone (INVEGA) 24 hr tablet 6 mg  6 mg Oral Daily Nelly Rout, MD   6 mg at 05/13/13 0850  . pantoprazole (PROTONIX) EC tablet 40 mg  40 mg Oral Daily Nelly Rout, MD   40 mg at 05/13/13 0850  . propranolol (INDERAL) tablet 20 mg  20 mg Oral BID PRN Nelly Rout, MD      . topiramate (TOPAMAX) tablet 100 mg  100 mg Oral Daily Nelly Rout, MD   100 mg at 05/13/13 0850    Lab Results: No results found for this or any previous visit (from the past 48 hour(s)).  Physical Findings: AIMS:  , ,  ,  ,  CIWA:    COWS:     Treatment Plan Summary: Daily contact with patient to assess and evaluate symptoms and progress in treatment Medication management Continue current meds already started.   Plan:  Medical Decision Making Problem Points:  Established problem, stable/improving (1), Review of last therapy session (1) and Review of psycho-social stressors (1) Data Points:  Review of medication regiment & side effects (2)  I certify that inpatient services furnished can reasonably be expected to improve the patient's condition.   Lisa [name] 05/13/2013, 11:38 AM

## 2013-05-13 NOTE — Progress Notes (Signed)
D.  Pt flat but brightens on approach.  Denies complaints at this time.  Pt requested sleep medication.  Interacting appropriately with peers on the unit.  Denies SI/HI/hallucinations at this time.  Positive for evening wrap up group.  A.  Support and encouragement offered, medication given as ordered for insomnia  R.  Pt remains safe on unit, will continue to monitor.

## 2013-05-13 NOTE — Progress Notes (Signed)
Patient ID: Lisa [name], female   DOB: 04/21/1971, 42 y.o.   MRN: [id number] Psychoeducational Group Note  Date:  05/13/2013 Time:0930am  Group Topic/Focus:  Identifying Needs:   The focus of this group is to help patients identify their personal needs that have been historically problematic and identify healthy behaviors to address their needs.  Participation Level:  Active  Participation Quality:  Appropriate  Affect:  Appropriate  Cognitive:  Appropriate  Insight:  Supportive  Engagement in Group:  Supportive  Additional Comments:  Inventory and Psychoeducational group   Valente David 05/13/2013,12:42 PM

## 2013-05-13 NOTE — Progress Notes (Signed)
Adult Psychoeducational Group Note  Date:  05/13/2013 Time:  8:22 PM  Group Topic/Focus:  Activity ball   Participation Level:  Active  Participation Quality:  Appropriate  Affect:  Appropriate  Cognitive:  Appropriate  Insight: Appropriate  Engagement in Group:  Engaged  Modes of Intervention:  Activity and Discussion  Additional Comments:  Patient attended and participated in group. Today's group activity was the activity ball. Pt was asked to answer any question on the ball.    Lucca Greggs A 05/13/2013, 8:22 PM

## 2013-05-13 NOTE — Progress Notes (Signed)
Patient ID: Lisa [name], female   DOB: [birth year], 42 y.o.   MRN: [id number] 05-13-13 nursing shift note: D: pt has been visible in the milieu and participating in the groups, as well as interacting with her peers. She denied any si/hi. A: RN and staff had made themselves available to the patient. She had not required any prn's. R: on her inventory sheet she wrote she requested sleep medication, appetite good, energy low, attention improving with his depression and hopelessness both at 3. No w/d symptoms. After discharge she plans to " express my feelings instead of keeping them bottled up". RN will continue to monitor and Q 15 min ck's.

## 2013-05-13 NOTE — [facility] Group Notes (Signed)
[facility] Group Notes:  (Clinical Social Work)  05/13/2013     10-11AM  Summary of Progress/Problems:   The main focus of today's process group was for the patient to identify ways in which they have in the past sabotaged their own recovery. Motivational Interviewing was utilized to ask the group members what they get out of their substance use, and what reasons they may have for wanting to change.  The Stages of Change were explained using a handout, and patients identified where they currently are with regard to stages of change.  The patient expressed that she self sabotages by keeping everything bottled in instead of expressing her feelings.  Eventually this becomes overwhelming and she erupts in anger.  She is afraid if she lets her emotions out, she will not be able to stop or regain control of herself.  Type of Therapy:  Group Therapy - Process   Participation Level:  Active  Participation Quality:  Attentive  Affect:  Blunted and Depressed  Cognitive:  Oriented  Insight:  Developing/Improving  Engagement in Therapy:  Engaged  Modes of Intervention:  Education, Support and Processing, Motivational Interviewing  [employer], LCSW 05/13/2013, 1:28 PM

## 2013-05-14 NOTE — Progress Notes (Signed)
[facility] Group Notes:  (Nursing/MHT/Case Management/Adjunct)  Date:  05/14/2013  Time:  8:00p.m   Type of Therapy:  Psychoeducational Skills  Participation Level:  Active  Participation Quality:  Attentive  Affect:  Flat  Cognitive:  Appropriate  Insight:  Improving  Engagement in Group:  Lacking  Modes of Intervention:  Education  Summary of Progress/Problems: The patient shared in group this evening that she had a better day since she talked more than yesterday. As a theme for the day, her coping skill is to talk more and to not allow things to "bottle up" her feelings inside of her.   Octavia Mottola S 05/14/2013, 3:55 AM

## 2013-05-14 NOTE — [facility] Group Notes (Signed)
[facility] Group Notes:  (Clinical Social Work)  05/14/2013  10:00-11:00AM  Summary of Progress/Problems:   The main focus of today's process group was to   identify the patient's current support system and decide on other supports that can be put in place.  The picture on workbook was used to discuss why additional supports are needed, and a hand-out was distributed with four definitions/levels of support, then used to talk about how patients have given and received all different kinds of support.  An emphasis was placed on using counselor, doctor, therapy groups, 12-step groups, and problem-specific support groups to expand supports.  The patient identified her supports as being her children and a long-time friend.  Type of Therapy:  Process Group with Motivational Interviewing  Participation Level:  Active  Participation Quality:  Attentive  Affect:  Blunted  Cognitive:  Appropriate  Insight:  Engaged  Engagement in Therapy:  Engaged  Modes of Intervention:   Education, Support and Processing, Activity  [employer], LCSW 05/14/2013, 12:56 PM

## 2013-05-14 NOTE — Progress Notes (Signed)
Patient ID: Lisa [name], female   DO: October 14, 1970, 42 y.o.   MRM: [id number] 05-14-13 nursing shift note: this pt has been extremely cooperative and pleasant today. She has taken her medications, gone to groups and is interacting with her peers as well as staff. She has had no adverse effects from her medications. A: throughout the day RN asked the patient are her needs being met and she stated "yes". She has not required any PRN"S. R: on her inventory sheet she denied any SI. She also wrote: slept well, appetite good, energy normal, attention improving with her depression and hopelessness both at 2. She is not having any pain and has not had any physical problems. After discharge she plans to "increase her coping skills". RN will monitor and Q 15 min ck's continue.

## 2013-05-14 NOTE — Progress Notes (Signed)
Patient did attend the evening speaker AA meeting.  

## 2013-05-14 NOTE — Progress Notes (Signed)
[facility] MD Progress Note  05/14/2013 2:01 PM Lisa [name]  MRN:  [id number] Subjective:  Lisa [name] is a 42 yo female, admission day 3 for depression. Patient states "I feel fine" and that depression is improving. States that she "hears voices a little" but that the voices are not commanding her to do things. States group and individual therapy has been helpful because she has "opened up a little bit to express myself more." Denies SI or HI.   Diagnosis:   DSM5: Schizophrenia Disorders:  Schizoaffective Disorder Obsessive-Compulsive Disorders:   Trauma-Stressor Disorders:  Posttraumatic Stress Disorder (309.81) Substance/Addictive Disorders:   Depressive Disorders:  Disruptive Mood Dysregulation Disorder (296.99)  Axis I: Schizoaffective Disorder  ADL's:  Intact  Sleep: Good  Appetite:  Good  Suicidal Ideation:  No plan or ideation at present.   Homicidal Ideation:  No plan or ideation at present.  AEB (as evidenced by):  Psychiatric Specialty Exam: Review of Systems  Constitutional: Negative.   Respiratory: Negative.   Gastrointestinal: Negative.   Musculoskeletal: Negative.   Psychiatric/Behavioral: Positive for hallucinations. Negative for suicidal ideas.    Blood pressure 100/72, pulse 109, temperature 97.9 F (36.6 C), temperature source Oral, resp. rate 18, height 5' 7.5" (1.715 m), weight 122.471 kg (270 lb), last menstrual period 03/18/2013.Body mass index is 41.64 kg/(m^2).  General Appearance: Casual  Eye Contact::  Good  Speech:  Clear and Coherent  Volume:  Normal  Mood:  Dysphoric  Affect:  Constricted  Thought Process:  Disorganized  Orientation:  Full (Time, Place, and Person)  Thought Content:  Hallucinations: Auditory  Suicidal Thoughts:  No  Homicidal Thoughts:  No  Memory:  Recent;   Fair  Judgement:  Impaired  Insight:  Lacking  Psychomotor Activity:  Decreased  Concentration:  Fair  Recall:  Fair  Akathisia:  No  Handed:   Right  AIMS (if indicated):     Assets:  Desire for Improvement Social Support  Sleep:  Number of Hours: 7.75   Current Medications: Current Facility-Administered Medications  Medication Dose Route Frequency Provider Last Rate Last Dose  . acetaminophen (TYLENOL) tablet 650 mg  650 mg Oral Q6H PRN Nelly Rout, MD      . alum & mag hydroxide-simeth (MAALOX/MYLANTA) 200-200-20 MG/5ML suspension 30 mL  30 mL Oral Q4H PRN Nelly Rout, MD      . benztropine (COGENTIN) tablet 1 mg  1 mg Oral BID Nelly Rout, MD   1 mg at 05/14/13 0815  . citalopram (CELEXA) tablet 40 mg  40 mg Oral QHS Rachael Fee, MD   40 mg at 05/13/13 2208  . hydrochlorothiazide (HYDRODIURIL) tablet 25 mg  25 mg Oral Daily Nelly Rout, MD   25 mg at 05/14/13 0815  . hydrOXYzine (ATARAX/VISTARIL) tablet 25 mg  25 mg Oral TID PRN Nelly Rout, MD   25 mg at 05/13/13 2208  . ibuprofen (ADVIL,MOTRIN) tablet 600 mg  600 mg Oral Q8H PRN Nelly Rout, MD   600 mg at 05/11/13 2238  . magnesium hydroxide (MILK OF MAGNESIA) suspension 30 mL  30 mL Oral Daily PRN Nelly Rout, MD      . paliperidone (INVEGA) 24 hr tablet 6 mg  6 mg Oral Daily Nelly Rout, MD   6 mg at 05/14/13 0815  . pantoprazole (PROTONIX) EC tablet 40 mg  40 mg Oral Daily Nelly Rout, MD   40 mg at 05/14/13 0815  . propranolol (INDERAL) tablet 20 mg  20 mg Oral BID PRN  Nelly Rout, MD      . topiramate (TOPAMAX) tablet 100 mg  100 mg Oral Daily Nelly Rout, MD   100 mg at 05/14/13 0815    Lab Results: No results found for this or any previous visit (from the past 48 hour(s)).  Physical Findings: AIMS:  , ,  ,  ,    CIWA:    COWS:     Treatment Plan Summary: Daily contact with patient to assess and evaluate symptoms and progress in treatment Medication management  Plan: Continue current treatment plan.   Medical Decision Making Problem Points:  Established problem, stable/improving (1) and Review of psycho-social stressors (1) Data  Points:  Review of medication regiment & side effects (2)  I certify that inpatient services furnished can reasonably be expected to improve the patient's condition.   Alberteen Sam, FNP-BC 05/14/2013, 2:01 PM Agree with assessment and plan Madie Reno A. Dub Mikes, M.D.

## 2013-05-14 NOTE — Progress Notes (Signed)
Patient ID: Lisa [name], female   DOB: 12/24/1970, 42 y.o.   MRN: [id number] Psychoeducational Group Note  Date:  05/14/2013 Time:  0915am  Group Topic/Focus:  Making Healthy Choices:   The focus of this group is to help patients identify negative/unhealthy choices they were using prior to admission and identify positive/healthier coping strategies to replace them upon discharge.  Participation Level:  Active  Participation Quality:  Appropriate  Affect:  Appropriate  Cognitive:  Appropriate  Insight:  Supportive  Engagement in Group:  Supportive  Additional Comments:  Inventory and Psychoeducational group   Valente David 05/14/2013,10:22 AM

## 2013-05-15 DIAGNOSIS — F4310 Post-traumatic stress disorder, unspecified: Secondary | ICD-10-CM

## 2013-05-15 MED ORDER — BENZTROPINE MESYLATE 1 MG PO TABS: 1.0000 mg | ORAL_TABLET | Freq: Two times a day (BID) | ORAL | Status: DC

## 2013-05-15 MED ORDER — HYDROCHLOROTHIAZIDE 25 MG PO TABS: 25.0000 mg | ORAL_TABLET | Freq: Every day | ORAL | Status: DC

## 2013-05-15 MED ORDER — PROPRANOLOL HCL 20 MG PO TABS: 20.0000 mg | ORAL_TABLET | Freq: Two times a day (BID) | ORAL | Status: DC | PRN

## 2013-05-15 MED ORDER — OMEPRAZOLE 20 MG PO CPDR: 20.0000 mg | DELAYED_RELEASE_CAPSULE | Freq: Every day | ORAL | Status: DC

## 2013-05-15 MED ORDER — CITALOPRAM HYDROBROMIDE 40 MG PO TABS: 40.0000 mg | ORAL_TABLET | Freq: Every day | ORAL | Status: DC

## 2013-05-15 MED ORDER — HYDROXYZINE HCL 25 MG PO TABS: 25.0000 mg | ORAL_TABLET | Freq: Three times a day (TID) | ORAL | Status: DC | PRN

## 2013-05-15 MED ORDER — PALIPERIDONE ER 6 MG PO TB24: 6.0000 mg | ORAL_TABLET | Freq: Every day | ORAL | Status: DC

## 2013-05-15 MED ORDER — TOPIRAMATE 100 MG PO TABS: 100.0000 mg | ORAL_TABLET | Freq: Every day | ORAL | Status: DC

## 2013-05-15 NOTE — Progress Notes (Signed)
Adult Psychoeducational Group Note  Date:  05/15/2013 Time:  3:50 PM  Group Topic/Focus:  Self Care:   The focus of this group is to help patients understand the importance of self-care in order to improve or restore emotional, physical, spiritual, interpersonal, and financial health.  Participation Level:  Minimal  Participation Quality:  Appropriate and Resistant  Affect:  Appropriate  Cognitive:  Appropriate  Insight: Appropriate  Engagement in Group:  Engaged and Resistant  Modes of Intervention:  Discussion, Socialization and Support  Additional Comments:  Pt only spoke when Clinical research associate addressed her. Pt stated she needed to use her coping skills to manage her depression and not keep stuff bottled in. Pt stated she needed to talk about things that bother her before blowing up to improve her self care.  Caswell Corwin 05/15/2013, 3:50 PM

## 2013-05-15 NOTE — Progress Notes (Signed)
Patient ID: EVI MCCOMB, female   DOB: 20-Mar-1971, 42 y.o.   MRN: [id number] Shr has been discharged home and was picked up by a friend. She voiced that she understood the discharge instruction and follow up plan. She denies thoughts of SI and all belongs were taken home with her.
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
## 2013-05-15 NOTE — Progress Notes (Signed)
[facility] Adult Case Management Discharge Plan :  Will you be returning to the same living situation after discharge: Yes,  home with friend At discharge, do you have transportation home?:Yes,  friend Do you have the ability to pay for your medications:Yes,  [facility]  Release of information consent forms completed and in the chart;  Patient's signature needed at discharge.  Patient to Follow up at: Follow-up Information   Follow up with [facility] [place]. (Walk in between 8am-12pm for hospital followup/medication management/assessment for therapy services. If appt. already scheduled, please call [phone] to verify time/date.  )    Contact information:   [address] [place] [address] Phone: [phone] Fax: [phone]      Patient denies SI/HI:   Yes,  during group/self report.     Safety Planning and Suicide Prevention discussed:  Yes,  SPE completed with pt's friend. [facility] pamphlet provided to pt and he was encouraged to share this information with support network.   Smart, Lisa [name] 05/15/2013, 11:17 AM
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
## 2013-05-15 NOTE — [facility] Group Notes (Signed)
[facility] LCSW Aftercare Discharge Planning Group Note   05/15/2013 9:39 AM  Participation Quality:  Minimal   Mood/Affect:  Depressed  Depression Rating:  1  Anxiety Rating:  1  Thoughts of Suicide:  No Will you contract for safety?   NA  Current AVH:  No  Plan for Discharge/Comments:  Pt states that she plans to follow up at [facility] for med management and therapy. She plans to return to friend's home at d/c. Pt states that she feels ready for d/c today. CSW to inform MD and discuss in tx team.   Transportation Means: friend   Supports: friend and family support identified.   Smart, [employer]

## 2013-05-15 NOTE — Progress Notes (Signed)
D.  Pt pleasant on approach, denies complaints at this time.  Denies SI/HI/hallucinations.  Interacting appropriately within milieu.  Positive for evening AA group.  A.  Support and encouragement offered  R.  Pt remains safe on unit, will continue to monitor.
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
## 2013-05-17 NOTE — Progress Notes (Signed)
Patient Discharge Instructions:  After Visit Summary (AVS):   Faxed to:  05/17/13 Discharge Summary Note:   Faxed to:  05/17/13 Psychiatric Admission Assessment Note:   Faxed to:  05/17/13 Suicide Risk Assessment - Discharge Assessment:   Faxed to:  05/17/13 Faxed/Sent to the Next Level Care provider:  05/17/13 Faxed to [facility] @ [phone]  Jerelene Redden, 05/17/2013, 3:57 PM

## 2013-07-10 ENCOUNTER — Encounter: Payer: Self-pay | Admitting: Advanced Practice Midwife

## 2013-07-10 ENCOUNTER — Other Ambulatory Visit: Payer: Self-pay | Admitting: Nurse Practitioner

## 2013-07-10 DIAGNOSIS — Z1231 Encounter for screening mammogram for malignant neoplasm of breast: Secondary | ICD-10-CM

## 2013-07-11 ENCOUNTER — Emergency Department (HOSPITAL_COMMUNITY)
Admission: EM | Admit: 2013-07-11 | Discharge: 2013-07-11 | Disposition: A | Discharge status: Home / Self Care | Payer: Medicaid Other | Attending: Emergency Medicine | Admitting: Emergency Medicine

## 2013-07-11 ENCOUNTER — Emergency Department (HOSPITAL_COMMUNITY): Payer: Medicaid Other

## 2013-07-11 ENCOUNTER — Encounter (HOSPITAL_COMMUNITY): Payer: Self-pay | Admitting: Emergency Medicine

## 2013-07-11 DIAGNOSIS — S8391XA Sprain of unspecified site of right knee, initial encounter: Secondary | ICD-10-CM

## 2013-07-11 DIAGNOSIS — K219 Gastro-esophageal reflux disease without esophagitis: Secondary | ICD-10-CM | POA: Insufficient documentation

## 2013-07-11 DIAGNOSIS — G4730 Sleep apnea, unspecified: Secondary | ICD-10-CM | POA: Insufficient documentation

## 2013-07-11 DIAGNOSIS — Z862 Personal history of diseases of the blood and blood-forming organs and certain disorders involving the immune mechanism: Secondary | ICD-10-CM | POA: Insufficient documentation

## 2013-07-11 DIAGNOSIS — J45909 Unspecified asthma, uncomplicated: Secondary | ICD-10-CM | POA: Insufficient documentation

## 2013-07-11 DIAGNOSIS — F319 Bipolar disorder, unspecified: Secondary | ICD-10-CM | POA: Insufficient documentation

## 2013-07-11 DIAGNOSIS — F4310 Post-traumatic stress disorder, unspecified: Secondary | ICD-10-CM | POA: Insufficient documentation

## 2013-07-11 DIAGNOSIS — X500XXA Overexertion from strenuous movement or load, initial encounter: Secondary | ICD-10-CM | POA: Insufficient documentation

## 2013-07-11 DIAGNOSIS — Y9389 Activity, other specified: Secondary | ICD-10-CM | POA: Insufficient documentation

## 2013-07-11 DIAGNOSIS — Z79899 Other long term (current) drug therapy: Secondary | ICD-10-CM | POA: Insufficient documentation

## 2013-07-11 DIAGNOSIS — Y929 Unspecified place or not applicable: Secondary | ICD-10-CM | POA: Insufficient documentation

## 2013-07-11 DIAGNOSIS — IMO0002 Reserved for concepts with insufficient information to code with codable children: Secondary | ICD-10-CM | POA: Insufficient documentation

## 2013-07-11 DIAGNOSIS — G43909 Migraine, unspecified, not intractable, without status migrainosus: Secondary | ICD-10-CM | POA: Insufficient documentation

## 2013-07-11 DIAGNOSIS — Z9981 Dependence on supplemental oxygen: Secondary | ICD-10-CM | POA: Insufficient documentation

## 2013-07-11 DIAGNOSIS — I499 Cardiac arrhythmia, unspecified: Secondary | ICD-10-CM | POA: Insufficient documentation

## 2013-07-11 DIAGNOSIS — Z22322 Carrier or suspected carrier of Methicillin resistant Staphylococcus aureus: Secondary | ICD-10-CM | POA: Insufficient documentation

## 2013-07-11 MED ORDER — HYDROCODONE-ACETAMINOPHEN 5-325 MG PO TABS: 2.0000 | ORAL_TABLET | Freq: Four times a day (QID) | ORAL | Status: DC | PRN

## 2013-07-11 NOTE — ED Notes (Signed)
Pt c/o right knee pain and swelling x 2 days since twisting

## 2013-07-11 NOTE — ED Provider Notes (Signed)
CSN: [id number]     Arrival date & time 07/11/13  1817 History   First MD Initiated Contact with Patient 07/11/13 2029     Chief Complaint  Patient presents with  . Knee Pain   (Consider location/radiation/quality/duration/timing/severity/associated sxs/prior Treatment) HPI Stood up 2 days ago and twisted right knee when standing up and some right knee anteromedially pain and slight feeling of give way but she is able to walk with a limp is able to fully extend is able to flex to 90 no distal weakness or numbness no other injury no hip ankle or foot pain; no head or neck injury no neck pain back pain chest pain shortness breath abdominal pain no injury to her arms or left leg no pain to the right hip ankle or foot no weakness or numbness to the right foot does have some weakness to the right knee due to pain but is able to walk with a limp is able to fully extend her right knee against resistance and is able to flex right knee to 90.   Past Medical History  Diagnosis Date  . Trouble swallowing   . Change in voice   . Chest pain   . Abdominal pain   . Blood in stool   . Constipation   . Nausea   . Migraines   . Fatigue   . Gallstones 04/10/2011    with biliary pancreatitis  . Dysrhythmia   . Sleep apnea     cpap  . GERD (gastroesophageal reflux disease)   . Anemia     hx of anemic  . Depression   . MRSA carrier   . Pancreatitis   . Bipolar 1 disorder   . PTSD (post-traumatic stress disorder)   . Asthma    Past Surgical History  Procedure Laterality Date  . Nasal polyp surgery      polyp from vocal cords  . Wisdom tooth extraction    . Laparoscopic cholecystectomy w/ cholangiography  04/25/2011    Dr Jamey Ripa  . Cholecystectomy    . Dilation and curettage of uterus    . Esophagogastroduodenoscopy  06/02/2011    Procedure: ESOPHAGOGASTRODUODENOSCOPY (EGD);  Surgeon: Louis Meckel, MD;  Location: [facility];  Service: Endoscopy;  Laterality: N/A;  . Bravo ph study   06/02/2011    Procedure: BRAVO PH STUDY;  Surgeon: Louis Meckel, MD;  Location: [facility];  Service: Endoscopy;  Laterality: N/A;   Family History  Problem Relation Age of Onset  . Diabetes Mother   . Pancreatic cancer Mother   . Lung cancer Father   . Anesthesia problems Neg Hx   . Hypotension Neg Hx   . Malignant hyperthermia Neg Hx   . Pseudochol deficiency Neg Hx    History  Substance Use Topics  . Smoking status: Never Smoker   . Smokeless tobacco: Never Used  . Alcohol Use: No   OB History   Grav Para Term Preterm Abortions TAB SAB Ect Mult Living                 Review of Systems See HPI. Allergies  Review of patient's allergies indicates no known allergies.  Home Medications   Current Outpatient Rx  Name  Route  Sig  Dispense  Refill  . benztropine (COGENTIN) 1 MG tablet   Oral   Take 1 tablet (1 mg total) by mouth 2 (two) times daily.   60 tablet   0   . hydrochlorothiazide (HYDRODIURIL) 25 MG  tablet   Oral   Take 1 tablet (25 mg total) by mouth daily.   30 tablet   0   . hydrOXYzine (ATARAX/VISTARIL) 25 MG tablet   Oral   Take 25 mg by mouth 2 (two) times daily.         Marland Kitchen omeprazole (PRILOSEC) 20 MG capsule   Oral   Take 1 capsule (20 mg total) by mouth daily.   30 capsule   0   . paliperidone (INVEGA) 6 MG 24 hr tablet   Oral   Take 1 tablet (6 mg total) by mouth daily.   30 tablet   0   . propranolol (INDERAL) 20 MG tablet   Oral   Take 1 tablet (20 mg total) by mouth 2 (two) times daily as needed.   60 tablet   0   . topiramate (TOPAMAX) 100 MG tablet   Oral   Take 1 tablet (100 mg total) by mouth daily.   30 tablet   0   . HYDROcodone-acetaminophen (NORCO) 5-325 MG per tablet   Oral   Take 2 tablets by mouth every 6 (six) hours as needed for severe pain.   10 tablet   0    BP 120/63  Pulse 64  Temp(Src) 98.1 F (36.7 C) (Oral)  Resp 20  Wt 285 lb (129.275 kg)  SpO2 100%  LMP 05/11/2013 Physical Exam   Nursing note and vitals reviewed. Constitutional:  Awake, alert, nontoxic appearance.  HENT:  Head: Atraumatic.  Eyes: Right eye exhibits no discharge. Left eye exhibits no discharge.  Neck: Neck supple.  Cervical spine nontender  Pulmonary/Chest: Effort normal. She exhibits no tenderness.  Abdominal: Soft. There is no tenderness. There is no rebound.  Musculoskeletal: She exhibits tenderness. She exhibits no edema.  Baseline ROM, no obvious new focal weakness. Both arms and left leg nontender; right leg nontender at the hip ankle and foot with posterior tibial pulse intact normal light touch; right knee mild anterior and medial tenderness; full extension and flexion to 90; stable right knee exam; patella non-tender with full extension against resistance; negative Lachman's testing and no laxity with varus or valgus stress testing but does have pain right medial knee joint line with valgus stress testing and has some increased pain with McMurray's testing medial aspect of joint; right thigh patellar tendon and calf are all nontender.  Neurological:  Mental status and motor strength appears baseline for patient and situation.  Skin: No rash noted.  Psychiatric: She has a normal mood and affect.    ED Course  Procedures (including critical care time) Patient informed of clinical course, understand medical decision-making process, and agree with plan. Labs Review Labs Reviewed - No data to display Imaging Review Dg Knee Complete 4 Views Right  07/11/2013   CLINICAL DATA:  Pain while standing  EXAM: RIGHT KNEE - COMPLETE 4+ VIEW  COMPARISON:  None.  FINDINGS: There is no evidence of fracture, dislocation, or joint effusion. There is no evidence of arthropathy or other focal bone abnormality. Soft tissues are unremarkable.  IMPRESSION: Negative.   Electronically Signed   By: Elige Ko   On: 07/11/2013 19:28    EKG Interpretation   None       MDM   1. Right knee sprain, initial  encounter    I doubt any other EMC precluding discharge at this time including, but not necessarily limited to the following: Fracture or unstable knee, but cannot rule out medial meniscal injury along  with patient's suspected MCL sprain.    Hurman Horn, MD 07/11/13 2138

## 2013-07-17 ENCOUNTER — Ambulatory Visit: Payer: Self-pay | Admitting: Advanced Practice Midwife

## 2013-07-27 ENCOUNTER — Encounter: Payer: Self-pay | Admitting: Dietician

## 2013-07-27 ENCOUNTER — Encounter: Payer: Medicaid Other | Attending: Family Medicine | Admitting: Dietician

## 2013-07-27 VITALS — Ht 67.72 in | Wt 282.1 lb

## 2013-07-27 DIAGNOSIS — E669 Obesity, unspecified: Secondary | ICD-10-CM | POA: Insufficient documentation

## 2013-07-27 DIAGNOSIS — Z713 Dietary counseling and surveillance: Secondary | ICD-10-CM | POA: Insufficient documentation

## 2013-07-27 DIAGNOSIS — Z6830 Body mass index (BMI) 30.0-30.9, adult: Secondary | ICD-10-CM | POA: Insufficient documentation

## 2013-07-27 DIAGNOSIS — K5730 Diverticulosis of large intestine without perforation or abscess without bleeding: Secondary | ICD-10-CM | POA: Insufficient documentation

## 2013-07-27 DIAGNOSIS — K5732 Diverticulitis of large intestine without perforation or abscess without bleeding: Secondary | ICD-10-CM

## 2013-07-27 NOTE — Progress Notes (Signed)
Medical Nutrition Therapy:  Appt start time: 1700 end time:  1800.  Assessment:  Primary concerns today: Recent significant weight gain. Also has Hx of diverticulitis, GERD, and significant knee pain. WC measured at 46.75 in.  Preferred Learning Style:   No preference indicated   Learning Readiness:   Contemplating  MEDICATIONS: see list.   DIETARY INTAKE: Usual eating pattern includes 3 meals and 2 snacks per day. Everyday foods include oatmeal, sweet tea.  Avoided foods include none noted.    24-hr recall:  B ( AM): oatmeal (with water, splenda and brown sugar) or bowl of cereal (honey nut cheerios or cookie crisp, usually a kid's cereal) with 2% milk. Water. In the past used to skip breakfast.  Snk ( AM): dry cereal (kid's cereal as above), water  L ( PM): oatmeal (same method) Snk ( PM): none D ( PM): primarily baked proteins- usually chix or [place] (including ground [place]), rarely ground beef, no fish (roommate allergic); two veg- corn, peas, green beans, sometimes rice (although she doesn't particularly like the taste), potatoes. No dislike of veg noted. Snk ( PM): will get up in middle of night to snack- anything she can find- popsicle, cookies. Pt notes difficulty sleeping through the night.  Beverages: water, sweet tea, avoids sodas, no juices, no milk, no kool aid or similar. No EtOH.   Usual physical activity: little to none. Recently tore cartilage in her knee and has knee bone spurs.   Progress Towards Goal(s):  In progress.   Nutritional Diagnosis:  -3.3 Overweight/obesity As related to sedentary lifestyle, history of high consumption of sugary drinks, sweets, and starchy vegetables.  As evidenced by pt reports, BMI>30.    Intervention:  Nutrition counseling provided on limiting sugars, expanding diet to include significantly more fruits and nonstarchy vegetables, continued inclusion of whole grains, portion control, particularly for starches and sweets, lean  protein foods.  Due to knee ligament injury and spurs, pt is limited in safe physical activity to predominantly upper body movements and water-based exercise, although pt admits she does not know how to swim. RD recommended hand cycling machines at a gym or a number resistance exercises for the upper body, also water aerobics, yoga, and pilates are options.  For kcal control, RD advised pt to track her diet using smartphone, with goal of less than 1850 kcals per day (.6 kcal/kg/hr) and at least 100 g protein per day (0.8 g/kg/d).  Due to the pt's Hx of diverticulitis, RD also provided instruction on the probiotic diet to promote positive gut bacterial growth and prevent or delay flare-ups.  Teaching Method Utilized:  Visual Auditory  Handouts given during visit include:  Best Pro, Fat, and CHO rich foods  Probiotic Diet  Barriers to learning/adherence to lifestyle change: established habits, knee injuries  Demonstrated degree of understanding via:  Teach Back   Monitoring/Evaluation:  Dietary intake, exercise, portion control, and body weight in 2 month(s).

## 2013-08-25 ENCOUNTER — Ambulatory Visit (INDEPENDENT_AMBULATORY_CARE_PROVIDER_SITE_OTHER): Payer: Medicaid Other | Admitting: Advanced Practice Midwife

## 2013-08-25 ENCOUNTER — Encounter: Payer: Self-pay | Admitting: Advanced Practice Midwife

## 2013-08-25 VITALS — BP 112/81 | HR 78 | Temp 98.4°F | Wt 276.0 lb

## 2013-08-25 DIAGNOSIS — R140 Abdominal distension (gaseous): Secondary | ICD-10-CM | POA: Insufficient documentation

## 2013-08-25 DIAGNOSIS — N946 Dysmenorrhea, unspecified: Secondary | ICD-10-CM | POA: Insufficient documentation

## 2013-08-25 DIAGNOSIS — Z0000 Encounter for general adult medical examination without abnormal findings: Secondary | ICD-10-CM

## 2013-08-25 DIAGNOSIS — R102 Pelvic and perineal pain: Secondary | ICD-10-CM

## 2013-08-25 DIAGNOSIS — IMO0002 Reserved for concepts with insufficient information to code with codable children: Secondary | ICD-10-CM | POA: Insufficient documentation

## 2013-08-25 NOTE — Progress Notes (Addendum)
Subjective:     Lisa [name] is a 43 y.o. female here for a routine exam.  Current complaints: pt states that she does have a slight vaginal odor.  Pt denies vaginal irritation or itching.  Pt has no other complaints.  Personal health questionnaire reviewed: yes.  Patient reports periods are very painful. States she is bed ridden monthly for approx 10 days because they are so uncomfortable. In the past she has had polyps removed. She has not had GYN care due to laspe in insurance for 4-5 years. Reports constant bloating sensation. Not currently sexually active and doesn't have plans to be.    Gynecologic History No LMP recorded. Contraception: none Last Pap: 3-4years. Results were: normal Last mammogram: n/a, pt would like to know if she needs to have one.  Obstetric History OB History  No data available     The following portions of the patient's history were reviewed and updated as appropriate: allergies, current medications, past family history, past medical history, past social history, past surgical history and problem list.  Review of Systems A comprehensive review of systems was negative except for: Genitourinary: positive for dysmenorrhea    Objective:    BP 112/81  Pulse 78  Temp(Src) 98.4 F (36.9 C)  Wt 276 lb (125.193 kg)  General Appearance:    Alert, cooperative, no distress, appears stated age  Head:    Normocephalic, without obvious abnormality, atraumatic  Eyes:    PERRL, conjunctiva/corneas clear, EOM's intact, fundi    benign, both eyes  Ears:    Normal TM's and external ear canals, both ears  Nose:   Nares normal, septum midline, mucosa normal, no drainage    or sinus tenderness  Throat:   Lips, mucosa, and tongue normal; teeth and gums normal  Neck:   Supple, symmetrical, trachea midline, no adenopathy;    thyroid:  no enlargement/tenderness/nodules; no carotid   bruit or JVD  Back:     Symmetric, no curvature, ROM normal, no CVA tenderness   Lungs:     Clear to auscultation bilaterally, respirations unlabored  Chest Wall:    No tenderness or deformity   Heart:    Regular rate and rhythm, S1 and S2 normal, no murmur, rub   or gallop  Breast Exam:    No tenderness, masses, or nipple abnormality  Abdomen:     Soft, non-tender, bowel sounds active all four quadrants,    no masses, no organomegaly  Genitalia:    Normal female without lesion, discharge or tenderness  Rectal:    Normal tone, normal prostate, no masses or tenderness;   guaiac negative stool  Extremities:   Extremities normal, atraumatic, no cyanosis or edema  Pulses:   2+ and symmetric all extremities  Skin:   Skin color, texture, turgor normal, no rashes or lesions  Lymph nodes:   Cervical, supraclavicular, and axillary nodes normal  Neurologic:   CNII-XII intact, normal strength, sensation and reflexes    throughout      Assessment:   Patient Active Problem List   Diagnosis Date Noted  . High BMI 08/25/2013  . Dysmenorrhea 08/25/2013  . Abdominal bloating 08/25/2013  . Schizoaffective disorder 05/12/2013  . PTSD (post-traumatic stress disorder) 05/12/2013  . Bipolar I disorder, most recent episode (or current) depressed, unspecified 05/12/2013  . Abdominal pain, generalized 05/28/2011  . Esophageal reflux 05/28/2011      Plan:    Follow up in: 1 month.   Pap, GC/CT today Labs done and  pending. Pelvic US ordered Patient to f/u w/ MD for evaluation of pain, bloating and f/u from [place].  Patient is f/u in March w/ PCP. I recommend routine annual blood screening including HgA1C, TSH, CBC and CMP. Recommend mammogram.  30 min spent with patient greater than 80% spent in counseling and coordination of care.   Oluwaseyi Raffel Roni Bread CNM

## 2013-08-25 NOTE — Addendum Note (Signed)
Addended byRoni Bread, Penney Domanski H on: 08/25/2013 05:11 PM   Modules accepted: Orders

## 2013-08-26 LAB — RPR

## 2013-08-26 LAB — WET PREP BY MOLECULAR PROBE
Candida species: NEGATIVE
Gardnerella vaginalis: NEGATIVE
Trichomonas vaginosis: NEGATIVE

## 2013-08-26 LAB — HEPATITIS B SURFACE ANTIGEN: Hepatitis B Surface Ag: NEGATIVE

## 2013-08-26 LAB — GC/CHLAMYDIA PROBE AMP
CT Probe RNA: NEGATIVE
GC Probe RNA: NEGATIVE

## 2013-08-26 LAB — HIV ANTIBODY (ROUTINE TESTING W REFLEX): HIV: NONREACTIVE

## 2013-08-28 LAB — PAP IG W/ RFLX HPV ASCU

## 2013-08-30 ENCOUNTER — Other Ambulatory Visit: Payer: Medicaid Other

## 2013-08-30 ENCOUNTER — Other Ambulatory Visit: Payer: Self-pay | Admitting: Advanced Practice Midwife

## 2013-08-30 DIAGNOSIS — R102 Pelvic and perineal pain: Secondary | ICD-10-CM

## 2013-08-30 DIAGNOSIS — E669 Obesity, unspecified: Secondary | ICD-10-CM

## 2013-09-15 ENCOUNTER — Other Ambulatory Visit: Payer: Self-pay | Admitting: Advanced Practice Midwife

## 2013-09-15 ENCOUNTER — Ambulatory Visit (HOSPITAL_COMMUNITY): Admission: RE | Admit: 2013-09-15 | Payer: Medicaid Other | Source: Ambulatory Visit

## 2013-09-18 ENCOUNTER — Other Ambulatory Visit: Payer: Self-pay | Admitting: Internal Medicine

## 2013-09-18 ENCOUNTER — Other Ambulatory Visit: Payer: Self-pay | Admitting: Advanced Practice Midwife

## 2013-09-19 ENCOUNTER — Ambulatory Visit (INDEPENDENT_AMBULATORY_CARE_PROVIDER_SITE_OTHER): Payer: Medicaid Other

## 2013-09-19 ENCOUNTER — Other Ambulatory Visit: Payer: Self-pay | Admitting: Advanced Practice Midwife

## 2013-09-19 DIAGNOSIS — R102 Pelvic and perineal pain: Secondary | ICD-10-CM

## 2013-09-19 DIAGNOSIS — N946 Dysmenorrhea, unspecified: Secondary | ICD-10-CM

## 2013-09-19 DIAGNOSIS — D259 Leiomyoma of uterus, unspecified: Secondary | ICD-10-CM

## 2013-09-19 DIAGNOSIS — E669 Obesity, unspecified: Secondary | ICD-10-CM

## 2013-09-22 ENCOUNTER — Encounter: Payer: Self-pay | Admitting: Obstetrics & Gynecology

## 2013-09-28 ENCOUNTER — Encounter: Payer: Self-pay | Admitting: Dietician

## 2013-09-28 ENCOUNTER — Encounter: Payer: Medicaid Other | Attending: Family Medicine | Admitting: Dietician

## 2013-09-28 VITALS — Wt 265.5 lb

## 2013-09-28 DIAGNOSIS — Z713 Dietary counseling and surveillance: Secondary | ICD-10-CM | POA: Insufficient documentation

## 2013-09-28 DIAGNOSIS — K5730 Diverticulosis of large intestine without perforation or abscess without bleeding: Secondary | ICD-10-CM | POA: Insufficient documentation

## 2013-09-28 DIAGNOSIS — E669 Obesity, unspecified: Secondary | ICD-10-CM

## 2013-09-28 DIAGNOSIS — Z6830 Body mass index (BMI) 30.0-30.9, adult: Secondary | ICD-10-CM | POA: Insufficient documentation

## 2013-09-28 NOTE — Progress Notes (Signed)
Medical Nutrition Therapy:  Appt start time: 1430 end time:  1500.  Assessment:  Primary concerns today: Pt reports for F/U on obesity and HTN. Pt has lost 16.6 lbs since last appointment on 07/27/13. She has not stringently followed all recommendations, but has definitely cut back significantly on sweet drinks, sweet foods, and between meal and night snacking. Unfortunately, she has not reported any improvements in her sleep difficulties or her knee pain. On the plus side, she has been regularly taking a probiotic supplement and seen no DVT flare ups since last visit.  Learning Readiness:   Change in progress  MEDICATIONS: see list   DIETARY INTAKE: Usual eating pattern includes 3 meals and 0 snacks per day. Everyday foods include veg, poultry, cereals.  Avoided foods include sweets, red meat, fish, sweet drinks.    24-hr recall:  B ( AM): bowl of cereal (mostly kid's cereals) OR fruit. water  Snk ( AM): none  L ( PM): sandwich, leftovers, cereal. water Snk ( PM): none D ( PM): meat and 2 vegs (beans, green beans, corn, greens). Has eliminated canned veg in favor of frozen. Mostly avoids red meat in favor of poultry. Avoids fish bc roommate has allergy. water Snk ( PM): none. No longer snacks at night.  Beverages: had already cut out soda pop and fruit juice, since last appt has completely cut out sweet tea as well. Water is primary, very rarely fruit juice  Usual physical activity: has not joined [employer] yet, but has been doing more calisthenics at home. Knee pain still a problem.      Intervention:  Continue plan of care. RD advised pt to monitor kcal limit and protein intake more closely, particularly as wt loss progress may slow. Keeping food record via computer or phone will ensure wt loss progress continues fairly rapidly.  RD recommended pt either explore a yoga-based fitness series (one recommended) or the [employer] to provide more opportunities for exercise.  Barriers to  learning/adherence to lifestyle change: osteoarthritis of knee  Demonstrated degree of understanding via:  Teach Back   Monitoring/Evaluation:  Dietary intake, exercise, portion control, and body weight in 3 month(s).

## 2013-09-30 ENCOUNTER — Encounter: Payer: Self-pay | Admitting: Obstetrics & Gynecology

## 2013-09-30 DIAGNOSIS — N840 Polyp of corpus uteri: Secondary | ICD-10-CM | POA: Insufficient documentation

## 2013-10-09 ENCOUNTER — Ambulatory Visit: Payer: Medicaid Other | Admitting: Obstetrics & Gynecology

## 2014-01-04 ENCOUNTER — Ambulatory Visit: Payer: Self-pay | Admitting: Dietician

## 2014-02-10 ENCOUNTER — Emergency Department: Payer: Self-pay | Admitting: Emergency Medicine

## 2014-02-10 LAB — CBC WITH DIFFERENTIAL/PLATELET
Basophil #: 0.1 10*3/uL (ref 0.0–0.1)
Basophil %: 0.8 %
EOS ABS: 0.3 10*3/uL (ref 0.0–0.7)
Eosinophil %: 2.7 %
HCT: 37.5 % (ref 35.0–47.0)
HGB: 12.2 g/dL (ref 12.0–16.0)
Lymphocyte #: 3 10*3/uL (ref 1.0–3.6)
Lymphocyte %: 31.1 %
MCH: 27.5 pg (ref 26.0–34.0)
MCHC: 32.4 g/dL (ref 32.0–36.0)
MCV: 85 fL (ref 80–100)
Monocyte #: 0.9 x10 3/mm (ref 0.2–0.9)
Monocyte %: 9.4 %
NEUTROS ABS: 5.4 10*3/uL (ref 1.4–6.5)
NEUTROS PCT: 56 %
Platelet: 214 10*3/uL (ref 150–440)
RBC: 4.42 10*6/uL (ref 3.80–5.20)
RDW: 15.7 % — AB (ref 11.5–14.5)
WBC: 9.6 10*3/uL (ref 3.6–11.0)

## 2014-02-10 LAB — BASIC METABOLIC PANEL
ANION GAP: 5 — AB (ref 7–16)
BUN: 8 mg/dL (ref 7–18)
CHLORIDE: 109 mmol/L — AB (ref 98–107)
Calcium, Total: 8.4 mg/dL — ABNORMAL LOW (ref 8.5–10.1)
Co2: 27 mmol/L (ref 21–32)
Creatinine: 0.99 mg/dL (ref 0.60–1.30)
EGFR (Non-African Amer.): >60
Glucose: 85 mg/dL (ref 65–99)
Osmolality: 279 (ref 275–301)
Potassium: 4 mmol/L (ref 3.5–5.1)
Sodium: 141 mmol/L (ref 136–145)

## 2014-02-15 LAB — CULTURE, BLOOD (SINGLE)

## 2014-03-16 LAB — CBC WITH DIFFERENTIAL/PLATELET
BASOS ABS: 0.1 10*3/uL (ref 0.0–0.1)
Basophil %: 0.8 %
EOS ABS: 0.5 10*3/uL (ref 0.0–0.7)
EOS PCT: 3.5 %
HCT: 37.4 % (ref 35.0–47.0)
HGB: 12.2 g/dL (ref 12.0–16.0)
LYMPHS ABS: 2.6 10*3/uL (ref 1.0–3.6)
LYMPHS PCT: 19.5 %
MCH: 26.5 pg (ref 26.0–34.0)
MCHC: 32.5 g/dL (ref 32.0–36.0)
MCV: 81 fL (ref 80–100)
MONOS PCT: 9.9 %
Monocyte #: 1.3 x10 3/mm — ABNORMAL HIGH (ref 0.2–0.9)
Neutrophil #: 8.8 10*3/uL — ABNORMAL HIGH (ref 1.4–6.5)
Neutrophil %: 66.3 %
Platelet: 250 10*3/uL (ref 150–440)
RBC: 4.6 10*6/uL (ref 3.80–5.20)
RDW: 15.8 % — ABNORMAL HIGH (ref 11.5–14.5)
WBC: 13.3 10*3/uL — ABNORMAL HIGH (ref 3.6–11.0)

## 2014-03-17 ENCOUNTER — Observation Stay: Payer: Self-pay | Admitting: Internal Medicine

## 2014-03-17 LAB — COMPREHENSIVE METABOLIC PANEL
Albumin: 3.2 g/dL — ABNORMAL LOW (ref 3.4–5.0)
Alkaline Phosphatase: 55 U/L
Anion Gap: 9 (ref 7–16)
BUN: 14 mg/dL (ref 7–18)
Bilirubin,Total: 0.1 mg/dL — ABNORMAL LOW (ref 0.2–1.0)
CO2: 25 mmol/L (ref 21–32)
Calcium, Total: 8.7 mg/dL (ref 8.5–10.1)
Chloride: 108 mmol/L — ABNORMAL HIGH (ref 98–107)
Creatinine: 0.89 mg/dL (ref 0.60–1.30)
EGFR (African American): >60
EGFR (Non-African Amer.): >60
GLUCOSE: 124 mg/dL — AB (ref 65–99)
OSMOLALITY: 285 (ref 275–301)
Potassium: 3.3 mmol/L — ABNORMAL LOW (ref 3.5–5.1)
SGOT(AST): 29 U/L (ref 15–37)
SGPT (ALT): 27 U/L
SODIUM: 142 mmol/L (ref 136–145)
Total Protein: 7.5 g/dL (ref 6.4–8.2)

## 2014-03-17 LAB — URINALYSIS, COMPLETE
Bacteria: NONE SEEN
Bilirubin,UR: NEGATIVE
Blood: NEGATIVE
GLUCOSE, UR: NEGATIVE mg/dL (ref 0–75)
Ketone: NEGATIVE
Leukocyte Esterase: NEGATIVE
Nitrite: NEGATIVE
PROTEIN: NEGATIVE
Ph: 6 (ref 4.5–8.0)
RBC,UR: <1 /HPF (ref 0–5)
SPECIFIC GRAVITY: 1.019 (ref 1.003–1.030)

## 2014-03-17 LAB — TROPONIN I
Troponin-I: <0.02 ng/mL
Troponin-I: <0.02 ng/mL

## 2014-03-17 LAB — CK TOTAL AND CKMB (NOT AT ARMC)
CK, Total: 116 U/L
CK, Total: 106 U/L
CK-MB: <0.5 ng/mL — ABNORMAL LOW (ref 0.5–3.6)
CK-MB: 0.5 ng/mL (ref 0.5–3.6)

## 2014-03-18 LAB — HEMOGLOBIN A1C: Hemoglobin A1C: 5.6 % (ref 4.2–6.3)

## 2014-03-18 LAB — CBC WITH DIFFERENTIAL/PLATELET
BASOS PCT: 0.5 %
Basophil #: 0.1 10*3/uL (ref 0.0–0.1)
EOS ABS: 0.1 10*3/uL (ref 0.0–0.7)
Eosinophil %: 0.9 %
HCT: 33.9 % — AB (ref 35.0–47.0)
HGB: 10.6 g/dL — AB (ref 12.0–16.0)
LYMPHS ABS: 3.5 10*3/uL (ref 1.0–3.6)
Lymphocyte %: 20.8 %
MCH: 25.7 pg — ABNORMAL LOW (ref 26.0–34.0)
MCHC: 31.4 g/dL — AB (ref 32.0–36.0)
MCV: 82 fL (ref 80–100)
Monocyte #: 1.5 x10 3/mm — ABNORMAL HIGH (ref 0.2–0.9)
Monocyte %: 8.8 %
NEUTROS PCT: 69 %
Neutrophil #: 11.6 10*3/uL — ABNORMAL HIGH (ref 1.4–6.5)
Platelet: 226 10*3/uL (ref 150–440)
RBC: 4.14 10*6/uL (ref 3.80–5.20)
RDW: 16.3 % — ABNORMAL HIGH (ref 11.5–14.5)
WBC: 16.9 10*3/uL — AB (ref 3.6–11.0)

## 2014-03-18 LAB — BASIC METABOLIC PANEL
Anion Gap: 5 — ABNORMAL LOW (ref 7–16)
BUN: 8 mg/dL (ref 7–18)
CALCIUM: 8.4 mg/dL — AB (ref 8.5–10.1)
CHLORIDE: 109 mmol/L — AB (ref 98–107)
CO2: 26 mmol/L (ref 21–32)
Creatinine: 0.86 mg/dL (ref 0.60–1.30)
EGFR (African American): >60
Glucose: 90 mg/dL (ref 65–99)
Osmolality: 277 (ref 275–301)
Potassium: 3.8 mmol/L (ref 3.5–5.1)
Sodium: 140 mmol/L (ref 136–145)

## 2014-03-18 LAB — VANCOMYCIN, TROUGH: Vancomycin, Trough: 7 ug/mL — ABNORMAL LOW (ref 10–20)

## 2014-03-19 LAB — CBC WITH DIFFERENTIAL/PLATELET
BASOS PCT: 0.6 %
Basophil #: 0.1 10*3/uL (ref 0.0–0.1)
Eosinophil #: 0.3 10*3/uL (ref 0.0–0.7)
Eosinophil %: 2.9 %
HCT: 34.1 % — ABNORMAL LOW (ref 35.0–47.0)
HGB: 10.9 g/dL — ABNORMAL LOW (ref 12.0–16.0)
Lymphocyte #: 3.1 10*3/uL (ref 1.0–3.6)
Lymphocyte %: 25.8 %
MCH: 26 pg (ref 26.0–34.0)
MCHC: 32 g/dL (ref 32.0–36.0)
MCV: 81 fL (ref 80–100)
MONO ABS: 1.2 x10 3/mm — AB (ref 0.2–0.9)
MONOS PCT: 10.5 %
Neutrophil #: 7.2 10*3/uL — ABNORMAL HIGH (ref 1.4–6.5)
Neutrophil %: 60.2 %
Platelet: 235 10*3/uL (ref 150–440)
RBC: 4.19 10*6/uL (ref 3.80–5.20)
RDW: 16 % — ABNORMAL HIGH (ref 11.5–14.5)
WBC: 11.9 10*3/uL — ABNORMAL HIGH (ref 3.6–11.0)

## 2014-03-19 LAB — CREATININE, SERUM
Creatinine: 0.85 mg/dL (ref 0.60–1.30)
EGFR (African American): >60
EGFR (Non-African Amer.): >60

## 2014-03-19 LAB — VANCOMYCIN, TROUGH: Vancomycin, Trough: 14 ug/mL (ref 10–20)

## 2014-03-21 LAB — CULTURE, BLOOD (SINGLE)

## 2014-04-08 ENCOUNTER — Inpatient Hospital Stay: Payer: Self-pay | Admitting: Internal Medicine

## 2014-04-08 LAB — CBC
HCT: 36.1 % (ref 35.0–47.0)
HGB: 11.2 g/dL — ABNORMAL LOW (ref 12.0–16.0)
MCH: 25.1 pg — ABNORMAL LOW (ref 26.0–34.0)
MCHC: 31.1 g/dL — AB (ref 32.0–36.0)
MCV: 81 fL (ref 80–100)
Platelet: 273 10*3/uL (ref 150–440)
RBC: 4.47 10*6/uL (ref 3.80–5.20)
RDW: 16.7 % — ABNORMAL HIGH (ref 11.5–14.5)
WBC: 10.7 10*3/uL (ref 3.6–11.0)

## 2014-04-08 LAB — BASIC METABOLIC PANEL
Anion Gap: 1 — ABNORMAL LOW (ref 7–16)
BUN: 8 mg/dL (ref 7–18)
CALCIUM: 8.4 mg/dL — AB (ref 8.5–10.1)
CREATININE: 0.76 mg/dL (ref 0.60–1.30)
Chloride: 108 mmol/L — ABNORMAL HIGH (ref 98–107)
Co2: 29 mmol/L (ref 21–32)
EGFR (African American): >60
EGFR (Non-African Amer.): >60
GLUCOSE: 84 mg/dL (ref 65–99)
OSMOLALITY: 273 (ref 275–301)
POTASSIUM: 4.8 mmol/L (ref 3.5–5.1)
SODIUM: 138 mmol/L (ref 136–145)

## 2014-04-08 LAB — TROPONIN I: Troponin-I: <0.02 ng/mL

## 2014-04-10 LAB — CREATININE, SERUM
Creatinine: 0.77 mg/dL (ref 0.60–1.30)
EGFR (African American): >60
EGFR (Non-African Amer.): >60

## 2014-04-10 LAB — SEDIMENTATION RATE: Erythrocyte Sed Rate: 14 mm/hr (ref 0–20)

## 2014-04-12 LAB — PLATELET COUNT: Platelet: 284 10*3/uL (ref 150–440)

## 2014-04-12 LAB — RAPID HIV SCREEN (HIV 1/2 AB+AG)

## 2014-04-13 LAB — CULTURE, BLOOD (SINGLE)

## 2014-04-30 DIAGNOSIS — R05 Cough: Secondary | ICD-10-CM | POA: Insufficient documentation

## 2014-04-30 DIAGNOSIS — R053 Chronic cough: Secondary | ICD-10-CM | POA: Insufficient documentation

## 2014-04-30 DIAGNOSIS — G4733 Obstructive sleep apnea (adult) (pediatric): Secondary | ICD-10-CM | POA: Insufficient documentation

## 2014-06-01 ENCOUNTER — Emergency Department: Payer: Self-pay | Admitting: Emergency Medicine

## 2014-06-01 LAB — URINALYSIS, COMPLETE
BILIRUBIN, UR: NEGATIVE
BLOOD: NEGATIVE
Glucose,UR: NEGATIVE mg/dL (ref 0–75)
KETONE: NEGATIVE
Nitrite: NEGATIVE
PH: 6 (ref 4.5–8.0)
Protein: NEGATIVE
Specific Gravity: 1.018 (ref 1.003–1.030)
Squamous Epithelial: 12

## 2014-06-05 ENCOUNTER — Ambulatory Visit: Payer: Self-pay | Admitting: Internal Medicine

## 2014-06-08 LAB — BRONCHIAL WASH CULTURE

## 2014-07-09 ENCOUNTER — Encounter: Payer: Self-pay | Admitting: *Deleted

## 2014-07-10 ENCOUNTER — Encounter: Payer: Self-pay | Admitting: Obstetrics & Gynecology

## 2014-07-23 ENCOUNTER — Ambulatory Visit: Payer: Self-pay | Admitting: Specialist

## 2014-07-26 ENCOUNTER — Ambulatory Visit: Payer: Self-pay | Admitting: Cardiothoracic Surgery

## 2014-07-26 LAB — CBC CANCER CENTER
Basophil #: 0.2 x10 3/mm — ABNORMAL HIGH (ref 0.0–0.1)
Basophil %: 1.5 %
EOS PCT: 1.8 %
Eosinophil #: 0.2 x10 3/mm (ref 0.0–0.7)
HCT: 32.8 % — AB (ref 35.0–47.0)
HGB: 10.4 g/dL — AB (ref 12.0–16.0)
Lymphocyte #: 2.7 x10 3/mm (ref 1.0–3.6)
Lymphocyte %: 22.9 %
MCH: 23.2 pg — AB (ref 26.0–34.0)
MCHC: 31.6 g/dL — ABNORMAL LOW (ref 32.0–36.0)
MCV: 73 fL — ABNORMAL LOW (ref 80–100)
MONOS PCT: 12.4 %
Monocyte #: 1.5 x10 3/mm — ABNORMAL HIGH (ref 0.2–0.9)
NEUTROS PCT: 61.4 %
Neutrophil #: 7.2 x10 3/mm — ABNORMAL HIGH (ref 1.4–6.5)
Platelet: 325 x10 3/mm (ref 150–440)
RBC: 4.48 10*6/uL (ref 3.80–5.20)
RDW: 18.4 % — ABNORMAL HIGH (ref 11.5–14.5)
WBC: 11.7 x10 3/mm — ABNORMAL HIGH (ref 3.6–11.0)

## 2014-07-26 LAB — COMPREHENSIVE METABOLIC PANEL
ALBUMIN: 3.5 g/dL (ref 3.4–5.0)
ANION GAP: 11 (ref 7–16)
Alkaline Phosphatase: 52 U/L
BUN: 8 mg/dL (ref 7–18)
Bilirubin,Total: 0.2 mg/dL (ref 0.2–1.0)
CALCIUM: 8.6 mg/dL (ref 8.5–10.1)
CHLORIDE: 103 mmol/L (ref 98–107)
CREATININE: 0.91 mg/dL (ref 0.60–1.30)
Co2: 26 mmol/L (ref 21–32)
EGFR (African American): >60
EGFR (Non-African Amer.): >60
Glucose: 98 mg/dL (ref 65–99)
Osmolality: 278 (ref 275–301)
POTASSIUM: 3.5 mmol/L (ref 3.5–5.1)
SGOT(AST): 19 U/L (ref 15–37)
SGPT (ALT): 29 U/L
SODIUM: 140 mmol/L (ref 136–145)
Total Protein: 7.7 g/dL (ref 6.4–8.2)

## 2014-07-26 LAB — APTT: Activated PTT: 23.2 secs — ABNORMAL LOW (ref 23.6–35.9)

## 2014-07-26 LAB — PROTIME-INR
INR: 0.9
Prothrombin Time: 12.3 secs (ref 11.5–14.7)

## 2014-07-30 ENCOUNTER — Ambulatory Visit: Payer: Self-pay

## 2014-08-08 ENCOUNTER — Inpatient Hospital Stay: Payer: Self-pay

## 2014-08-09 LAB — BASIC METABOLIC PANEL
Anion Gap: 6 — ABNORMAL LOW (ref 7–16)
BUN: 6 mg/dL — ABNORMAL LOW (ref 7–18)
CREATININE: 0.89 mg/dL (ref 0.60–1.30)
Calcium, Total: 9 mg/dL (ref 8.5–10.1)
Chloride: 102 mmol/L (ref 98–107)
Co2: 30 mmol/L (ref 21–32)
EGFR (Non-African Amer.): >60
Glucose: 106 mg/dL — ABNORMAL HIGH (ref 65–99)
Osmolality: 274 (ref 275–301)
Potassium: 3.7 mmol/L (ref 3.5–5.1)
SODIUM: 138 mmol/L (ref 136–145)

## 2014-08-09 LAB — CBC WITH DIFFERENTIAL/PLATELET
BASOS PCT: 0.1 %
Basophil #: 0 10*3/uL (ref 0.0–0.1)
EOS PCT: 0.1 %
Eosinophil #: 0 10*3/uL (ref 0.0–0.7)
HCT: 31.7 % — AB (ref 35.0–47.0)
HGB: 9.8 g/dL — AB (ref 12.0–16.0)
LYMPHS ABS: 1.5 10*3/uL (ref 1.0–3.6)
Lymphocyte %: 8.9 %
MCH: 22.5 pg — AB (ref 26.0–34.0)
MCHC: 30.9 g/dL — ABNORMAL LOW (ref 32.0–36.0)
MCV: 73 fL — AB (ref 80–100)
MONO ABS: 1.9 x10 3/mm — AB (ref 0.2–0.9)
Monocyte %: 10.8 %
Neutrophil #: 13.8 10*3/uL — ABNORMAL HIGH (ref 1.4–6.5)
Neutrophil %: 80.1 %
Platelet: 273 10*3/uL (ref 150–440)
RBC: 4.35 10*6/uL (ref 3.80–5.20)
RDW: 18.3 % — ABNORMAL HIGH (ref 11.5–14.5)
WBC: 17.3 10*3/uL — ABNORMAL HIGH (ref 3.6–11.0)

## 2014-08-12 LAB — MISC AER/ANAEROBIC CULT.

## 2014-08-13 ENCOUNTER — Ambulatory Visit: Payer: Self-pay | Admitting: Cardiothoracic Surgery

## 2014-08-13 LAB — PLATELET COUNT: Platelet: 179 10*3/uL (ref 150–440)

## 2014-08-15 LAB — CULTURE, FUNGUS WITHOUT SMEAR

## 2014-08-17 ENCOUNTER — Inpatient Hospital Stay: Payer: Self-pay | Admitting: Internal Medicine

## 2014-08-17 DIAGNOSIS — I361 Nonrheumatic tricuspid (valve) insufficiency: Secondary | ICD-10-CM

## 2014-08-17 LAB — COMPREHENSIVE METABOLIC PANEL
ALK PHOS: 48 U/L (ref 46–116)
ALT: 31 U/L (ref 14–63)
Albumin: 2.5 g/dL — ABNORMAL LOW (ref 3.4–5.0)
Anion Gap: 7 (ref 7–16)
BUN: 11 mg/dL (ref 7–18)
Bilirubin,Total: 0.3 mg/dL (ref 0.2–1.0)
Calcium, Total: 8.4 mg/dL — ABNORMAL LOW (ref 8.5–10.1)
Chloride: 105 mmol/L (ref 98–107)
Co2: 27 mmol/L (ref 21–32)
Creatinine: 0.85 mg/dL (ref 0.60–1.30)
EGFR (African American): >60
EGFR (Non-African Amer.): >60
Glucose: 80 mg/dL (ref 65–99)
Osmolality: 276 (ref 275–301)
Potassium: 3.6 mmol/L (ref 3.5–5.1)
SGOT(AST): 30 U/L (ref 15–37)
Sodium: 139 mmol/L (ref 136–145)
Total Protein: 7.3 g/dL (ref 6.4–8.2)

## 2014-08-17 LAB — TROPONIN I: TROPONIN-I: 0.02 ng/mL

## 2014-08-17 LAB — URINALYSIS, COMPLETE
BILIRUBIN, UR: NEGATIVE
Blood: NEGATIVE
Glucose,UR: NEGATIVE mg/dL (ref 0–75)
Ketone: NEGATIVE
LEUKOCYTE ESTERASE: NEGATIVE
Nitrite: NEGATIVE
Ph: 5 (ref 4.5–8.0)
Protein: NEGATIVE
Specific Gravity: >1.06 (ref 1.003–1.030)
WBC UR: <1 /HPF (ref 0–5)

## 2014-08-17 LAB — CBC WITH DIFFERENTIAL/PLATELET
BASOS PCT: 0.4 %
Basophil #: 0.1 10*3/uL (ref 0.0–0.1)
EOS ABS: 0.7 10*3/uL (ref 0.0–0.7)
Eosinophil %: 4.2 %
HCT: 29 % — ABNORMAL LOW (ref 35.0–47.0)
HGB: 9.2 g/dL — AB (ref 12.0–16.0)
Lymphocyte #: 3 10*3/uL (ref 1.0–3.6)
Lymphocyte %: 18.9 %
MCH: 22.8 pg — AB (ref 26.0–34.0)
MCHC: 31.6 g/dL — AB (ref 32.0–36.0)
MCV: 72 fL — ABNORMAL LOW (ref 80–100)
MONO ABS: 1.4 x10 3/mm — AB (ref 0.2–0.9)
Monocyte %: 9.1 %
NEUTROS PCT: 67.4 %
Neutrophil #: 10.6 10*3/uL — ABNORMAL HIGH (ref 1.4–6.5)
Platelet: 189 10*3/uL (ref 150–440)
RBC: 4.03 10*6/uL (ref 3.80–5.20)
RDW: 18.5 % — ABNORMAL HIGH (ref 11.5–14.5)
WBC: 15.7 10*3/uL — AB (ref 3.6–11.0)

## 2014-08-17 LAB — PROTIME-INR
INR: 1
Prothrombin Time: 13.3 secs

## 2014-08-17 LAB — HEPARIN LEVEL (UNFRACTIONATED): ANTI-XA(UNFRACTIONATED): 0.76 [IU]/mL — AB (ref 0.30–0.70)

## 2014-08-17 LAB — CK TOTAL AND CKMB (NOT AT ARMC): CK, Total: 87 U/L (ref 26–192)

## 2014-08-18 LAB — CBC WITH DIFFERENTIAL/PLATELET
Basophil #: 0.1 10*3/uL (ref 0.0–0.1)
Basophil %: 0.8 %
Eosinophil #: 0.7 10*3/uL (ref 0.0–0.7)
Eosinophil %: 6.2 %
HCT: 28.4 % — ABNORMAL LOW (ref 35.0–47.0)
HGB: 9 g/dL — ABNORMAL LOW (ref 12.0–16.0)
LYMPHS ABS: 2.9 10*3/uL (ref 1.0–3.6)
Lymphocyte %: 23.9 %
MCH: 22.4 pg — AB (ref 26.0–34.0)
MCHC: 31.7 g/dL — ABNORMAL LOW (ref 32.0–36.0)
MCV: 71 fL — ABNORMAL LOW (ref 80–100)
MONO ABS: 1 x10 3/mm — AB (ref 0.2–0.9)
Monocyte %: 8.5 %
NEUTROS ABS: 7.2 10*3/uL — AB (ref 1.4–6.5)
Neutrophil %: 60.6 %
Platelet: 216 10*3/uL (ref 150–440)
RBC: 4.02 10*6/uL (ref 3.80–5.20)
RDW: 18.1 % — ABNORMAL HIGH (ref 11.5–14.5)
WBC: 12 10*3/uL — ABNORMAL HIGH (ref 3.6–11.0)

## 2014-08-18 LAB — IRON AND TIBC
IRON BIND. CAP.(TOTAL): 354 ug/dL (ref 250–450)
IRON: 25 ug/dL — AB (ref 50–170)
Iron Saturation: 7 %
Unbound Iron-Bind.Cap.: 329 ug/dL

## 2014-08-20 LAB — CEA: CEA: 2.1 ng/mL (ref 0.0–4.7)

## 2014-08-20 LAB — CA 125: CA 125: 89.7 U/mL — ABNORMAL HIGH (ref 0.0–34.0)

## 2014-08-22 DIAGNOSIS — J8489 Other specified interstitial pulmonary diseases: Secondary | ICD-10-CM | POA: Insufficient documentation

## 2014-09-07 ENCOUNTER — Emergency Department: Payer: Self-pay | Admitting: Emergency Medicine

## 2014-09-11 ENCOUNTER — Ambulatory Visit
Admit: 2014-09-11 | Disposition: A | Discharge status: Home / Self Care | Payer: Self-pay | Attending: Internal Medicine | Admitting: Internal Medicine

## 2014-10-01 ENCOUNTER — Emergency Department: Payer: Self-pay | Admitting: Emergency Medicine

## 2014-10-12 ENCOUNTER — Ambulatory Visit
Admit: 2014-10-12 | Disposition: A | Discharge status: Home / Self Care | Payer: Self-pay | Attending: Internal Medicine | Admitting: Internal Medicine

## 2014-10-15 LAB — CBC CANCER CENTER
BASOS PCT: 0.2 %
Basophil #: 0 x10 3/mm (ref 0.0–0.1)
EOS ABS: 0.1 x10 3/mm (ref 0.0–0.7)
Eosinophil %: 0.3 %
HCT: 37.8 % (ref 35.0–47.0)
HGB: 12 g/dL (ref 12.0–16.0)
Lymphocyte #: 1.3 x10 3/mm (ref 1.0–3.6)
Lymphocyte %: 8 %
MCH: 25.2 pg — AB (ref 26.0–34.0)
MCHC: 31.6 g/dL — AB (ref 32.0–36.0)
MCV: 80 fL (ref 80–100)
Monocyte #: 0.5 x10 3/mm (ref 0.2–0.9)
Monocyte %: 2.9 %
NEUTROS ABS: 14.1 x10 3/mm — AB (ref 1.4–6.5)
Neutrophil %: 88.6 %
Platelet: 290 x10 3/mm (ref 150–440)
RBC: 4.75 10*6/uL (ref 3.80–5.20)
RDW: 26.1 % — ABNORMAL HIGH (ref 11.5–14.5)
WBC: 15.9 x10 3/mm — ABNORMAL HIGH (ref 3.6–11.0)

## 2014-10-16 LAB — CA 125: CA 125: 38.9 U/mL — AB (ref 0.0–34.0)

## 2014-11-01 LAB — CBC CANCER CENTER
BASOS ABS: 0.1 x10 3/mm (ref 0.0–0.1)
Basophil %: 0.7 %
Eosinophil #: 0.1 x10 3/mm (ref 0.0–0.7)
Eosinophil %: 0.8 %
HCT: 39.8 % (ref 35.0–47.0)
HGB: 12.7 g/dL (ref 12.0–16.0)
LYMPHS PCT: 16 %
Lymphocyte #: 2.1 x10 3/mm (ref 1.0–3.6)
MCH: 26.6 pg (ref 26.0–34.0)
MCHC: 32 g/dL (ref 32.0–36.0)
MCV: 83 fL (ref 80–100)
Monocyte #: 1 x10 3/mm — ABNORMAL HIGH (ref 0.2–0.9)
Monocyte %: 7.4 %
Neutrophil #: 9.9 x10 3/mm — ABNORMAL HIGH (ref 1.4–6.5)
Neutrophil %: 75.1 %
PLATELETS: 220 x10 3/mm (ref 150–440)
RBC: 4.78 10*6/uL (ref 3.80–5.20)
RDW: 23.5 % — AB (ref 11.5–14.5)
WBC: 13.1 x10 3/mm — ABNORMAL HIGH (ref 3.6–11.0)

## 2014-11-03 NOTE — Discharge Summary (Signed)
PATIENT NAME:  Lisa, [name] MR#:  [id number] DATE OF BIRTH:  [birth year]  PRIMARY CARE PHYSICIAN: Nonlocal.   DISCHARGE DIAGNOSES: 1.  Pneumonia.  2.  Anemia.  3.  Asthma.  4.  Obesity.   CONDITION: Stable.   CODE STATUS: Full code.   HOME MEDICATIONS: Omeprazole 40 mg p.o. daily, benzonatate 100 mg p.o. every 6 hours p.r.n.,  Augmentin 875/125 mg p.o. q.12 hours for 7 days.   DIET: Low-fat, low-cholesterol, ADA diet.   ACTIVITY: As tolerated.   FOLLOWUP CARE: Follow up with PCP within 1-2 weeks.   REASON FOR ADMISSION: Pneumonia.   HOSPITAL COURSE: The patient is a 44 year old African American female with a history of asthma who presented to the ED with worsening cough and dyspnea on exertion. The patient was diagnosed with pneumonia one month ago and completed outpatient treatment with Levaquin; however, the patient stated that she did not feel better. For detailed history and physical examination, please refer to the admission note dictated by Dr. Marcille Blanco. The patient was noticed to have tachycardia and had mild low O2 saturation in the ED. In addition, the patient had increased white blood cells. So the patient was admitted for pneumonia, failed outpatient treatment. After admission, the patient has been treated with Zithromax, Rocephin and vancomycin. In addition, the patient was treated with nebulizer p.r.n. and cough medication. The patient's white count increased to 16 yesterday; however, repeat WBC today is down to 11.9. The patient's symptoms have much improved, but she still has a mild cough; however, the patient's vital signs are stable. Physical examination is unremarkable. She is clinically stable and will be discharged to home today. I discussed the patient's discharge plan with the patient's nurse and case manager.   TIME SPENT: About 35 minutes.    ____________________________ Demetrios Loll, MD qc:ts D: 03/19/2014 13:47:00 ET T: 03/19/2014 18:05:38  ET JOB#: [id number]  cc: Demetrios Loll, MD, <Dictator> Demetrios Loll MD ELECTRONICALLY SIGNED 03/23/2014 12:44

## 2014-11-03 NOTE — H&P (Signed)
PATIENT NAME:  JAMILYNN, WHITACRE MR#:  [id number] DATE OF BIRTH:  [birth year]  DATE OF ADMISSION:  03/17/2014  REFERRING PHYSICIAN: Dr. Beather Arbour.   PRIMARY CARE PHYSICIAN: Nonlocal.  ADMISSION DIAGNOSIS: Pneumonia.   HISTORY OF PRESENT ILLNESS: This is a 44 year old African American female who presents to the Emergency Department with worsening cough and dyspnea on exertion. The patient also states that her chest and back hurt. She was diagnosed with pneumonia approximately 1 month ago and has completed outpatient treatment with Levaquin. However, the patient states that she feels no better. She is able to eat, but her cough and dyspnea sometimes limit her appetite. She denies any vomiting but admits to nausea. Upon arrival to the Emergency Department the patient's oxygen saturations were mildly low on room air and she was slightly tachycardic. Increased white  blood cell count over her original labs from 1 month ago prompted the Emergency Department to call for admission due to failure of outpatient treatment.  REVIEW OF SYSTEMS:  CONSTITUTIONAL: The patient denies fever, but admits to weakness.  EYES: The patient denies double vision or inflammation.  EARS, NOSE AND THROAT: The patient denies tinnitus, but admits to sore throat.  RESPIRATORY: The patient admits to cough and dyspnea.  CARDIOVASCULAR: The patient admits to some chest pain that radiates to her back.  GASTROINTESTINAL: The patient admits to nausea but denies vomiting or abdominal pain.  GENITOURINARY: The patient denies dysuria or increased frequency or hesitancy.  ENDOCRINE: The patient denies polyuria or nocturia. HEMATOLOGIC AND LYMPHATIC: The patient denies easy bruising or bleeding.  INTEGUMENT: The patient denies rashes or lesions.  MUSCULOSKELETAL: The patient denies myalgias or arthralgias.  NEUROLOGIC: The patient denies numbness on her extremities or dysarthria.  PSYCHIATRIC: The patient denies depression or suicidal  ideation.   PAST MEDICAL HISTORY: Significant for asthma.   PAST SURGICAL HISTORY: Cholecystectomy, as well as a kidney stone removal.   FAMILY HISTORY: Diabetes type 2, lung cancer in her father who is deceased of this condition, pancreatic cancer in her mother, who is deceased of that condition and breast cancer in her paternal grandmother and aunt. Hypertension is also present throughout multiple members of the family.   SOCIAL HISTORY: The patient does not smoke, drink or do any drugs. She lives with her children, all of whom are in good health.   MEDICATIONS: None.   ALLERGIES: No known drug allergies.   PERTINENT LABORATORY RESULTS AND PHYSICAL EXAMINATION FINDINGS:  Glucose 124, BUN 14, creatinine 0.89, potassium is 3.3, chloride 108. First set of troponins is negative. White blood cell count is 13.3. Urine is negative for infection. Chest x-ray shows mild progression of patchy, coarse, bilateral pulmonary infiltrates over the previous study.   PHYSICAL EXAMINATION:  VITAL SIGNS: Temperature is 98.7, pulse 117, respirations 22, blood pressure 118/71, pulse oximetry 94% on room air.  GENERAL: The patient is alert and oriented x 3 in no apparent distress.  HEENT: Normocephalic, atraumatic. Pupils equal, round, and reactive to light and accommodation. Extraocular movements are intact. Mucous membranes are moist.  NECK: Trachea is midline. No adenopathy.  CHEST: Symmetric and atraumatic.  CARDIOVASCULAR: Tachycardic with normal S1, S2. No rubs, clicks or murmurs appreciated.  LUNGS: Clear to auscultation bilaterally. Normal effort and excursion.  ABDOMEN: Positive bowel sounds, soft, nontender, nondistended. No hepatosplenomegaly.  GENITOURINARY: Deferred.  MUSCULOSKELETAL: The patient moves all 4 extremities equally.  SKIN: No rashes or lesions.  EXTREMITIES: No clubbing, cyanosis, or edema.  NEUROLOGIC: Cranial nerves II through XII grossly  intact.  PSYCHIATRIC: Mood is normal.  Affect is congruent.   ASSESSMENT AND PLAN: This is a 44 year old female admitted for failure of outpatient treatment for pneumonia.   1. Pneumonia. Worsening cough, as well as mild hypoxemia as well as back pain and chest pain. The latter are likely musculoskeletal. We will continue to follow the patient's cardiac markers. I switched the patient to IV antibiotics consists of ceftriaxone and azithromycin. I have added vancomycin to her regimen as well.  2. Asthma is stable. Will measure peak flow for assessment prior to discharge.  3. Obesity. BMI is 39.7. Place the patient on a heart healthy diet. Also add a hemoglobin A1C to her  laboratories. 4. Deep vein thrombosis prophylaxis. Sequential compression devices.  5. Gastrointestinal prophylaxis with pantoprazole.   CODE STATUS: The patient is a full code.   TIME SPENT ON ADMISSION ORDERS AND PATIENT CARE: Approximately 30 minutes.     ____________________________ Norva Riffle. Marcille Blanco, MD msd:JT D: 03/17/2014 01:57:52 ET T: 03/17/2014 04:19:17 ET JOB#: [id number]  cc: Norva Riffle. Marcille Blanco, MD, <Dictator> Norva Riffle Criag Wicklund MD ELECTRONICALLY SIGNED 03/17/2014 7:26

## 2014-11-03 NOTE — H&P (Signed)
PATIENT NAME:  Lisa [name], Lisa [name] MR#:  [id number] DATE OF BIRTH:  [birth year]  DATE OF ADMISSION:  04/08/2014  PRIMARY CARE PHYSICIAN: Nonlocal.  CHIEF COMPLAINT: Increasing shortness of breath and persistent dry hacky cough.   HISTORY OF PRESENT ILLNESS: Lisa [name] is a pleasant 44 year old obese African American female with history of asthma, history of sleep apnea, not using her CPAP and history of chronic anemia suspected secondary to menorrhagia, comes to the Emergency Room after she continues with above chief complaint. The patient was treated with 2 rounds of antibiotics, 1 in August, 1 in September for bilateral pneumonia. She recently finished up her treatment with Augmentin, comes back with increasing shortness of breath, cough which is dry and low-grade fever of 100 at home. She is being admitted for further evaluation and management. She received IV vancomycin and meropenem.   PAST MEDICAL HISTORY:  1.  Recent treatment for pneumonia x 2 in August and September 2015.  2.  Asthma.  3.  Migraine headaches.  4.  Depression.  5.  Bipolar disorder.  6.  Anemia suspected, could be due to heavy menstrual periods.  7.  History of kidney stones.   ALLERGIES: No known drug allergies.   SOCIAL HISTORY: She is unemployed. Denies any history of smoking. Denies any history of alcohol use.   PAST SURGICAL HISTORY: Cholecystectomy as well as kidney stone removal in the past.   FAMILY HISTORY: Type 2 diabetes and lung cancer in father who is deceased. Pancreatic cancer in mother.   MEDICATIONS: Omeprazole 40 mg p.o. daily. The patient recently finished a course of antibiotics. She uses her inhalers, she does not recall the name   REVIEW OF SYSTEMS: CONSTITUTIONAL: Mild low-grade fever. Positive for fatigue and weakness. No weight loss or weight gain.  EYES: No blurred or double vision, glaucoma or cataracts.  EARS, NOSE AND THROAT: No tinnitus, ear pain, or hearing loss.  GASTROINTESTINAL:  No nausea, vomiting, diarrhea, abdominal pain or hematemesis.  No GERD. GENITOURINARY: No dysuria, hematuria, frequency.  ENDOCRINE: No polyuria, nocturia or thyroid problems.  HEMATOLOGY: Positive for chronic anemia. No easy bruising disorder.  ENDOCRINE: No polyuria, nocturia or thyroid problems.   SKIN: No acne, rash or lesions.  CARDIOVASCULAR: No chest pain, orthopnea, or edema. RESPIRATORY: Positive for chronic cough and asthma. Positive for dyspnea on exertion.  NEUROLOGIC: No CVA, TIA or seizures,  PSYCHIATRIC: No anxiety. Positive for history of depression. All other systems reviewed and negative.   PHYSICAL EXAMINATION:  GENERAL: The patient is awake, alert, oriented x 3, not in acute distress.  VITAL SIGNS: Afebrile. Pulse is 79, blood pressure is 120/67, saturations are 97% on room air.  HEENT: Atraumatic, normocephalic. PERRLA. EOM intact. Oral mucosa is moist.  NECK: Supple. No JVD. No carotid bruit.  RESPIRATORY: Distant breath sounds. Clear to auscultation. No respiratory distress, labored breathing. No audible rales or rhonchi.  CARDIOVASCULAR: Both the heart sounds are normal. Rate, rhythm regular. PMI not lateralized. Chest is nontender.  EXTREMITIES: Good pedal pulses, good femoral pulses. No lower extremity edema.  ABDOMEN: Soft, benign, nontender. No organomegaly. Positive bowel sounds.  NEUROLOGIC: Grossly intact cranial nerves II-XII. No motor or sensory deficit.  PSYCHIATRIC: The patient is awake, alert, oriented x 3.  SKIN: Warm and dry.   LABORATORY DATA AND IMAGING: CT of the chest without contrast shows scattered parenchymal densities throughout both lungs.  There has been any evidence. Findings raise concern for chronic inflammatory process. The differential diagnosis includes organizing pneumonia versus nonspecific  interstitial pneumonia. Chest x-ray shows patchy bilateral pulmonary infiltrates, they have progressed slightly from prior examination. White count  is 10.7, H and H is 11.2 and 36.1. Troponin less than 0.02. Basic metabolic panel within normal limits except calcium of 8.4, chloride of 108. EKG shows normal sinus rhythm.   ASSESSMENT: [age]-year-old Lisa [name] with history of asthma, sleep apnea, and recent treatment for pneumonia, comes in with:  1.  Bilateral patchy infiltrates with increasing shortness of breath, dry cough. The patient is going to be admitted for possible bilateral interstitial pneumonia. She already had 2 rounds of antibiotics. We will give her broad-spectrum with IV vancomycin and IV meropenem. I will give her a short course of steroids. I will start her also on Solu-Medrol 60 mg p.o. daily for possible underlying interstitial pneumonitis. I spoke with Dr. Raul Del who recommends getting a total IgE level. Continue breathing treatments and inhalers around the clock. Dr. Raul Del to see the patient.  2.  History of asthma. P.r.n. DuoNebs and oral inhalers. Saturations are 975 to 99% on room air.  3.  Morbid obesity with history of sleep apnea. The patient was again encouraged to discuss with her primary care physician for CPAP and get herself started on CPAP.  4.  Chronic anemia. Hemoglobin and hematocrit appear stable. The patient does report having heavy menstrual cycles. Recommend her follow up outpatient with obstetrics/gynecology.  5.  Deep venous thrombosis prophylaxis. Subcutaneous heparin t.i.d.  6.  Further workup according to the patient's clinical course. Hospital admission plan was discussed with the patient and questions were answered. Plan was also discussed with Dr. Raul Del.   TIME SPENT: Fifty minutes.    ____________________________ Hart Rochester Posey Pronto, MD sap:TT D: 04/08/2014 15:55:02 ET T: 04/08/2014 17:58:18 ET JOB#: [id number]  cc: Bostyn Kunkler A. Posey Pronto, MD, <Dictator> Ilda Basset MD ELECTRONICALLY SIGNED 04/19/2014 17:04

## 2014-11-03 NOTE — Discharge Summary (Signed)
PATIENT NAME:  [name], Lisa MR#:  [id number] DATE OF BIRTH:  03/14/1971  DATE OF ADMISSION:  04/08/2014 DATE OF DISCHARGE:  04/12/2014  ADMISSION DIAGNOSIS: Bilateral patchy infiltrates with increasing shortness of breath, likely pneumonia.   DISCHARGE DIAGNOSES:  1. Pneumonia with possible underlying interstitial lung disease.  2. Gastroesophageal reflux disease.   CONSULTATIONS: Dr. Fleming.   LABORATORY AND DIAGNOSTIC DATA: Pertinent CT scan on admission showed scattered parenchymal densities throughout both lungs. Findings raised concern for chronic inflammatory process, and the differential includes chronic eosinophilic pneumonia, nonspecific interstitial pneumonia, and organizing pneumonia.   HIV was negative, nonreactive.   ESR was 14. ACE enzyme was normal.   Blood culture, 1 out of 2 coagulase-negative Staphylococcus, which is a contaminant.   HOSPITAL COURSE: [name] who presented on the 27th with increasing cough, shortness of breath, continued to have a pneumonia on CT scan. For further details, please refer to the history and physical.   1. Pneumonia likely cause of her progressive respiratory symptoms over the past month. Questionable bacterial pneumonia versus interstitial pneumonia versus BOOP. Appreciate pulmonary consultation. She is currently on 50 mg of prednisone and oral antibiotics. She will have follow-up with Dr. Fleming in 7 to 10 days with outpatient follow-up and may need a bronchoscopy. I did order an HIV, which essentially was negative.  2. GERD. The patient is on omeprazole at home.   DISCHARGE MEDICATIONS:  1. Omeprazole 40 mg daily.  2. Ventolin HFA 2 puffs 4 times a day p.r.n.  3. Bipolar medicine. 4. Cefuroxime 500 mg daily for 7 days.  5. Azithromycin 250 daily.  6     tussionex  5 mL q.4 h. p.r.n. cough 7. Prednisone 50 mg continue until follow-up with Dr. Fleming.   DISCHARGE DIET: Regular diet.   DISCHARGE ACTIVITY: As  tolerated.   DISCHARGE FOLLOW-UP: The patient will follow up in 1 week with Dr. Fleming.   The patient was stable for discharge.   TIME SPENT: Approximately 35 minutes.    ____________________________  P. , MD spm:JT D: 04/12/2014 11:54:24 ET T: 04/12/2014 14:09:55 ET JOB#: [id number]  cc:  P. , MD, <Dictator> Herbon E. Fleming, MD  P  MD ELECTRONICALLY SIGNED 04/12/2014 21:43 

## 2014-11-05 LAB — SURGICAL PATHOLOGY

## 2014-11-08 ENCOUNTER — Other Ambulatory Visit: Payer: Self-pay | Admitting: Internal Medicine

## 2014-11-08 DIAGNOSIS — Z86718 Personal history of other venous thrombosis and embolism: Secondary | ICD-10-CM

## 2014-11-11 NOTE — H&P (Signed)
PATIENT NAME:  Lisa, [name] MR#:  [id number] DATE OF BIRTH:  07/04/1971  DATE OF ADMISSION:  08/17/2014  REFERRING PHYSICIAN: Valli Glance. Owens Shark, MD   PRIMARY CARE PRACTITIONER: Nonlocal.    PRIMARY PULMONOLOGIST: Herbon E. Raul Del, MD   CHIEF COMPLAINT: Right-sided chest wall pain at the site of recent lung biopsy.   HISTORY OF PRESENT ILLNESS: A 44 year old African American female with a history of hypertension, chronic anemia, recent right lung biopsy done on 08/08/2014 for suspected interstitial lung disease, and history of bronchial asthma presents to the Emergency Room with the complaints of ongoing pain on the right side of the chest at the site of recent lung biopsy. The patient was evaluated by the ED physician and underwent a CT angiogram of the chest and found to have bilateral pulmonary embolism with right heart strain. In view of her recent lung biopsy done about 9 days ago, hematologist on-call was consulted by the ED physician regarding the safety of anticoagulation and, as per the ED physician, hematologist recommended to start on therapeutic Lovenox. The patient was started on therapeutic Lovenox by the ED physician and the hospitalist service of consulted for further evaluation and management. The patient does have some chronic cough, but no increased cough recently. She does have some chronic shortness of breath, but no increase lately. No recent leg swellings, The patient received  IV pain medications and currently her pain is under reasonable control.   PAST MEDICAL HISTORY:  1.  Asthma.  2.  Hypertension.  3.  Chronic anemia.  4.  History of kidney stones.  5.  History of bipolar disorder/depression.  6.  History of multiple admissions for pneumonia.  7.  Recent right lung biopsy done on 08/08/2014 for suspected interstitial lung disease.    PAST SURGICAL HISTORY:  1.  Cholecystectomy.  2.  Kidney stone removal.   ALLERGIES: No known drug allergies.   SOCIAL  HISTORY: She is unemployed. Denies any history of smoking, alcohol, or substance abuse.    FAMILY HISTORY: Father with type 2 diabetes and lung cancer and pancreatic cancer in mother.   HOME MEDICATIONS:  1.  Hydrochlorothiazide 25 mg tablet 1 tablet orally once a day.  2.  Omeprazole 40 mg 1 capsule orally once a day.  3.  Percocet 5/325 mg 1 tablet orally every 6 hours as needed for pain.  4.  Ventolin HFA 90 mcg inhalation 2 puffs every 4-6 hours as needed.    REVIEW OF SYSTEMS:  CONSTITUTIONAL: Negative for fever, fatigue, or generalized weakness.  EYES: Negative for blurred vision, double vision. No pain. No redness. No discharge.  EARS, NOSE, AND THROAT: Negative for tinnitus, ear pain, hearing loss, epistaxis, nasal discharge, or difficulty swallowing.  RESPIRATORY: Positives for right-sided chest pain at the site of recent lung biopsy. Positive for chronic cough, which is the same as before. Positive for chronic shortness of breath, but no wheezing. No hemoptysis.  CARDIOVASCULAR: Negative for palpitations, dizziness, syncopal episodes, orthopnea, dyspnea on exertion, pedal edema.  GASTROINTESTINAL: Negative for nausea, vomiting, diarrhea, abdominal pain, hematemesis, melena, rectal bleeding. History of GERD symptoms stable on daily proton pump inhibitor.  GENITOURINARY: Negative for dysuria, frequency, urgency, or hematuria.  ENDOCRINE: Negative for polyuria, nocturia, heat or cold intolerance.  HEMATOLOGIC: Positive for chronic anemia. Negative for bleeding or swollen glands.  INTEGUMENTARY: Negative for acne, skin lesion, or rash.  MUSCULOSKELETAL: Negative for arthritis, gout, neck pain, or back pain.  NEUROLOGICAL: Negative for focal weakness, numbness. No history  of CVA, TIA, or seizure disorder.  PSYCHIATRIC: Positive for depression, currently not on any medications.   PHYSICAL EXAMINATION:  VITAL SIGNS: Temperature 98.2 degrees Fahrenheit, pulse rate 104 per minute,  respirations 18 per minute, blood pressure 133/77, oxygen saturation 95% on room air.  GENERAL: Well-developed, well-nourished, obese, alert, awake, and oriented, in no acute distress, comfortably resting in the bed.  HEAD: Atraumatic, normocephalic.  EYES: Pupils are equal, react to light and accommodation. No conjunctival pallor. No icterus. Extraocular movements intact.  NOSE: No drainage. No lesions.  EARS: No drainage. No external lesions.  MOUTH: No mucosal lesions. No exudates.  NECK: Supple. No JVD. No thyromegaly. No carotid bruit. Range of motion of neck within normal limits.  RESPIRATORY: Good respiratory effort. Not using accessory muscles of respiration. Bilateral vesicular breath sounds present. A few rales at the bases. No rhonchi.  CARDIOVASCULAR: S1, S2 regular. No monos, gallops, or clicks. Peripheral pulses equal at carotid, femoral, and pedal pulses. No peripheral edema.  GASTROINTESTINAL: Abdomen is soft, obese, nontender. No hepatosplenomegaly. No masses. No rigidity. No guarding. Bowel sounds present and equal in all 4 quadrants.  CHEST WALL: Recent surgical scar on the right lower chest on the posterior side.  GENITOURINARY: Deferred.  MUSCULOSKELETAL: No joint tenderness or effusion. Range of motion is adequate. Strength and tone equal bilaterally.  SKIN: Inspection within normal limits.  LYMPHATIC: No cervical lymphadenopathy.  VASCULAR: Poor dorsalis pedis and posterior tibial pulses.  NEUROLOGICAL: Alert, awake, and oriented x 3. Cranial nerves II-XII intact. No sensory deficit. Motor strength is 5/5 in both upper and lower extremities. DTRs 2+ bilaterally and symmetrical. Plantars downgoing.  PSYCHIATRIC: Alert, awake, and oriented x 3. Judgment and insight adequate. Memory and mood within normal limits.   ANCILLARY DATA: LABORATORY DATA: Serum glucose 80, BUN 11, creatinine 0.85, sodium 139, potassium 3.6, chloride 105, bicarbonate 25, total calcium 8.4, total  protein 7.3, albumin 2.5, bilirubin 0.3, alkaline phosphatase 48, AST 38, ALT 31. Total CK 87, CK-MB is less than 0.5. Troponin 0.02. WBC 15.7, hemoglobin 9.2, hematocrit 29.0, platelet count 189,000. Pro time 13.3, INR 1.0.   IMAGING STUDIES: Chest x-ray impression: Improved bibasilar aeration from prior examination. Multifocal chronic disease again seen. No pneumothorax.   CT angiogram of the chest:  1.  Positive for acute pulmonary embolism with CT evidence of right heart strain consistent with acute submassive pulmonary embolism.  2.  Multifocal parenchymal opacity similar to prior examination, may reflect organizing pneumonia, CORRELATION with recent biopsy recommended.  3.  Small amount of right chest wall  consistent with recent biopsy. No pneumothorax.   EKG: Normal sinus rhythm with ventricular rate of 94 beats per minute.   ASSESSMENT AND PLAN: A 44 year old African American female with a history of recent right lung biopsy done on 08/08/2014 for suspected interstitial lung disease, presents with a right-sided chest pain at the site of lung biopsy, found to have bilateral pulmonary embolism with right heart strain on the CT angiogram of the chest, started on therapeutic Lovenox by Emergency Department after hematologic consultation.   1.  Bilateral pulmonary embolism with right heart strain per CT angiogram of the chest. The patient started on anticoagulation subcutaneous Lovenox.   PLAN: Admit to medical floor. Continue with subcutaneous therapeutic Lovenox. Monitor for bleeding. Bilateral lower extremity deep vein thrombosis studies ordered to evaluate for deep venous thromboses and hematologic consultation requested for further advice regarding hypercoagulopathy workup.  2.  Recent right lung biopsy for suspected interstitial lung disease done  on 08/08/2014. No bleeding. Persistent postoperative pain present.  PLAN: Continue pain control medications. Pulmonary consultation for further  advice.  3.  Chronic anemia: Hemoglobin and hematocrit mildly low, but stable. No active bleeding. Monitor CBC closely.  4.  Hypertension: Stable on hydrochlorothiazide. Continue same.  5.  Gastroesophageal reflux disease: Stable on proton pump inhibitor. Continue same.  6.  History of bipolar disorder: No medications at present, stable. Monitor.  7.  Deep vein thrombosis prophylaxis: The patient on therapeutic subcutaneous Lovenox.  8.  Gastrointestinal prophylaxis. Proton pump inhibitor day.   CODE STATUS: Full code.   TIME SPENT: 50 minutes.    ____________________________ Juluis Mire, MD enr:bm D: 08/17/2014 05:52:00 ET T: 08/17/2014 06:24:41 ET JOB#: [id number]  cc: Juluis Mire, MD, <Dictator> Herbon E. Raul Del, MD Juluis Mire MD ELECTRONICALLY SIGNED 08/17/2014 17:18

## 2014-11-11 NOTE — Consult Note (Signed)
CHIEF COMPLAINT and HISTORY:  Subjective/Chief Complaint Chest pain, shortness of breath   History of Present Illness Patient is a 44 yo AAF who underwent a lung biopsy within the past week.  She returned yesterday with right sided pleuritic chest pain and shortness of breath.  Did not notice any major issues with pain or swelling in her legs, but admits her chest hurt bad enough she really wasnt paying much attention to this.  Was found to have a large PE on CT scan but breathing has improved with anticoagulation and she is now breathing comfortably off oxygen less than 24 hours later.  Was found to have right popliteal DVT on duplex.  This right leg DVt was the likely source of her PE, and we are asked to assess for need for IVC filter.  She has had no bleeding or problems with anticoagulation so far.  Leg has minimal swelling and is not really hurting her.  No previous history of clots.  Hypercoagulable work up pending.   PAST MEDICAL/SURGICAL HISTORY:  Past Medical History:   Diverticulitis:    Migraines:    Asthma:    Depression:    Bipolar Disorder:    Anemia:    Kidney Stones:    Cholecystectomy:    Lung Biopsy:   ALLERGIES:  Allergies:  No Known Allergies:   HOME MEDICATIONS:  Home Medications: Medication Instructions Status  apixaban 5 mg oral tablet 2 tab(s) orally 2 times a day x 7 days , then start 5 mg-1 pill twice daily dose Active  apixaban 5 mg oral tablet 1 tab(s) orally 2 times a day after done with the loading dose Active  Ventolin HFA CFC free 90 mcg/inh inhalation aerosol 2 puff(s) inhaled 4 times a day as needed for shortness of breath/wheezing  Active  omeprazole 40 mg oral delayed release capsule 1 cap(s) orally 2 times a day Active  Percocet 5/325 325 mg-5 mg oral tablet 1 tab(s) orally every 6 hours Active   Family and Social History:  Family History Diabetes Mellitus  Cancer   Social History negative tobacco, negative ETOH, negative Illicit drugs    Place of Living Home   Review of Systems:  Subjective/Chief Complaint No TIA/stroke/seizure No heat or cold intolerance No dysuria/hematuria No blurry or double vision No tinnitus or ear pain No rashes or ulcer No suicidal ideation or psychosis No signs of bleeding or easy bruising.  H/o chronic anemia though Positive for SOB, improved since admission No palpitations.  Positive for chest pain No N/V/D or abdominal pain No joint pain or joint swelling No fever or chills No unintentional weight loss or gain   Physical Exam:  GEN well developed, well nourished, no acute distress, pleasant and breathing easy on RA   HEENT pink conjunctivae, hearing intact to voice   NECK No masses  trachea midline   RESP normal resp effort  no use of accessory muscles   CARD regular rate  No LE edema  no JVD   VASCULAR ACCESS none   ABD denies tenderness  soft   GU no superpubic tenderness   LYMPH negative neck, negative axillae   EXTR negative cyanosis/clubbing, negative edema, no real tenderness or redness in LE.  Swelling is really not present in either leg   SKIN normal to palpation, No ulcers, skin turgor good   NEURO cranial nerves intact, follows commands, motor/sensory function intact   PSYCH alert, A+O to time, place, person, good insight   LABS:  Laboratory Results:  LabObservation:    05-Feb-16 12:35, [place] Color Flow Doppler Low Extrem Bilat (Legs)  OBSERVATION   Reason for Test Positive Known Pulmonary Embolus    05-Feb-16 20:26, Echo Doppler  OBSERVATION   Reason for Test  Hepatic:    05-Feb-16 04:36, Comprehensive Metabolic Panel  Bilirubin, Total 0.3  Alkaline Phosphatase 48  SGPT (ALT) 31  SGOT (AST) 30  Total Protein, Serum 7.3  Albumin, Serum 2.5  Routine Chem:  Glucose, Serum 80  BUN 11  Creatinine (comp) 0.85  Sodium, Serum 139  Potassium, Serum 3.6  Chloride, Serum 105  CO2, Serum 27  Calcium (Total), Serum 8.4  Osmolality (calc) 276  eGFR  (African American) >60  eGFR (Non-African American) >60  eGFR values <65m/min/1.73 m2 may be an indication of chronic  kidney disease (CKD).  Calculated eGFR, using the MRDR Study equation, is useful in   patients with stable renal function.  The eGFR calculation will not be reliable in acutely ill patients  when serum creatinine is changing rapidly. It is not useful in  patients on dialysis. The eGFR calculation may not be applicable  to patients at the low and high extremes of body sizes, pregnant  women, and vegetarians.  Anion Gap 7    06-Feb-16 06:50, Iron and IBC ([facility])  Iron Binding Capacity (TIBC) 354  Unbound Iron Binding Capacity 329  Iron, Serum 25  Iron Saturation 7  Result(s) reported on 18 Aug 2014 at 12:09PM.  Cardiac:    05-Feb-16 04:36, Cardiac Panel  CK, Total 87  CPK-MB, Serum < 0.5  Result(s) reported on 17 Aug 2014 at 05:14AM.    05-Feb-16 04:36, Troponin I  Troponin I 0.02  0.00-0.05  0.05 ng/mL or less: NEGATIVE   Repeat testing in 3-6 hrs   if clinically indicated.  >0.05 ng/mL: POTENTIAL   MYOCARDIAL INJURY. Repeat   testing in 3-6 hrs if   clinically indicated.  NOTE: An increase or decrease   of 30% or more on serial   testing suggests a   clinically important change  Routine UA:    05-Feb-16 07:40, Urinalysis  Color (UA) Yellow  Clarity (UA) Clear  Glucose (UA) Negative  Bilirubin (UA) Negative  Ketones (UA) Negative  Specific Gravity (UA) >1.060  Blood (UA) Negative  pH (UA) 5.0  Protein (UA) Negative  Nitrite (UA) Negative  Leukocyte Esterase (UA) Negative  Result(s) reported on 17 Aug 2014 at 08:12AM.  RBC (UA) 1 /HPF  WBC (UA) <1 /HPF  Bacteria (UA) TRACE  Epithelial Cells (UA) 5 /HPF  Mucous (UA) PRESENT  Result(s) reported on 17 Aug 2014 at 08:12AM.  Routine Coag:    05-Feb-16 04:36, Prothrombin Time  Prothrombin 13.3  11.4-15.0  NOTE: New Reference Range   08/10/14  INR 1.0  INR reference interval applies to patients  on anticoagulant therapy.  A single INR therapeutic range for coumarins is not optimal for all  indications; however, the suggested range for most indications is  2.0 - 3.0.  Exceptions to the INR Reference Range may include: Prosthetic heart  valves, acute myocardial infarction, prevention of myocardial  infarction, and combinations of aspirin and anticoagulant. The need  for a higher or lower target INR must be assessed individually.  Reference: The Pharmacology and Management of the Vitamin K   antagonists: the seventh ACCP Conference on Antithrombotic and  Thrombolytic Therapy. CNLZJQ.7341Sept:126 (3suppl): [id number]  A HCT value >55% may artifactually increase the PT.  In one study,   the increase  was an average of 25%.  Reference:  "Effect on Routine and Special Coagulation Testing Values  of Citrate Anticoagulant Adjustment in Patients with High HCT Values."  American Journal of Clinical Pathology 2006;126:400-405.  Routine Hem:    05-Feb-16 04:36, CBC Profile  WBC (CBC) 15.7  RBC (CBC) 4.03  Hemoglobin (CBC) 9.2  Hematocrit (CBC) 29.0  Platelet Count (CBC) 189  MCV 72  MCH 22.8  MCHC 31.6  RDW 18.5  Neutrophil % 67.4  Lymphocyte % 18.9  Monocyte % 9.1  Eosinophil % 4.2  Basophil % 0.4  Neutrophil # 10.6  Lymphocyte # 3.0  Monocyte # 1.4  Eosinophil # 0.7  Basophil # 0.1  Result(s) reported on 17 Aug 2014 at 05:13AM.    06-Feb-16 06:58, CBC Profile  WBC (CBC) 12.0  RBC (CBC) 4.02  Hemoglobin (CBC) 9.0  Hematocrit (CBC) 28.4  Platelet Count (CBC) 216  MCV 71  MCH 22.4  MCHC 31.7  RDW 18.1  Neutrophil % 60.6  Lymphocyte % 23.9  Monocyte % 8.5  Eosinophil % 6.2  Basophil % 0.8  Neutrophil # 7.2  Lymphocyte # 2.9  Monocyte # 1.0  Eosinophil # 0.7  Basophil # 0.1  Result(s) reported on 18 Aug 2014 at 07:54AM.   RADIOLOGY:  Radiology Results: XRay:    01-Aug-15 01:26, Chest PA and Lateral  Chest PA and Lateral  REASON FOR EXAM:    sob,  cough  COMMENTS:       PROCEDURE: DXR - DXR CHEST PA (OR AP) AND LATERAL  - Feb 10 2014  1:26AM     CLINICAL DATA:  Four day history of migraine headache. Three day  history of cough and shortness of breath. Current history of asthma.    EXAM:  CHEST  2 VIEW    COMPARISON:  None.    FINDINGS:  Suboptimal inspiration due to body habitus accounts for crowded  bronchovascular markings, especially in the bases, and accentuates  the cardiac silhouette. Taking this into account, cardiomediastinal  silhouette unremarkable. Streaky and patchy airspace opacities in  the lower lobes, right middle lobe, and left upper lobe. Pulmonary  vascularity normal. No pleural effusions. No pneumothorax.  Visualized bony thorax intact.     IMPRESSION:  Suboptimal inspiration. Patchy pneumonia involving the lower lobes,  right middle lobe and left upper lobe.      Electronically Signed    By: Evangeline Dakin M.D.    On: 02/10/2014 01:31     Verified By: Deniece Portela, M.D.,    04-Sep-15 22:47, Chest PA and Lateral  Chest PA and Lateral  REASON FOR EXAM:    cough  COMMENTS:   May transport without cardiac monitor    PROCEDURE: DXR - DXR CHEST PA (OR AP) AND LATERAL  - Mar 16 2014 10:47PM     CLINICAL DATA:  Cough for 2 weeks.  Pneumonia 4 weeks ago.    EXAM:  CHEST  2 VIEW    COMPARISON:  02/10/2014    FINDINGS:  Shallow inspiration. Normal heart size and pulmonary vascularity.  Coarse patchy infiltrates throughout both lungs, demonstrating  slight progression since previous study. Changes likely to represent  multifocal bilateral pneumonia although chronic bronchitis with  scarring could also be present. No blunting of costophrenic angles.  No pneumothorax.     IMPRESSION:  Mild progression of patchy coarse bilateral pulmonary infiltrates  since previous study.      Electronically Signed    By: Lucienne Capers  M.D.    On: 03/16/2014 23:23       Verified By: Neale Burly, M.D.,    27-Sep-15 11:15, Chest PA and Lateral  Chest PA and Lateral  REASON FOR EXAM:    SOB/recent pnemonia  COMMENTS:       PROCEDURE: DXR - DXR CHEST PA (OR AP) AND LATERAL  - Apr 08 2014 11:15AM     CLINICAL DATA:  Shortness of breath.  Cough.    EXAM:  CHEST  2 VIEW    COMPARISON:  03/16/2014.  02/10/2014.    FINDINGS:  Mediastinum and hilar structures are normal. Bilateral patchy  pulmonary infiltrates are present. These have progressed slightly  from prior exam. No pleural effusion or pneumothorax. Heart size  normal. Pulmonary vascularity normal. No acute bony abnormality.  Cholecystectomy.     IMPRESSION:  Patchy bilateral pulmonary infiltrates. These have progressed  slightly from prior exam.      Electronically Signed    By: Udell    On: 04/08/2014 12:34         Verified By: Osa Craver, M.D., MD    14-Jan-16 12:24, Chest PA and Lateral  Chest PA and Lateral  REASON FOR EXAM:    preop for lung surgery  COMMENTS:       PROCEDURE: DXR - DXR CHEST PA (OR AP) AND LATERAL  - Jul 26 2014 12:24PM     CLINICAL DATA:  Pneumonia.    EXAM:  CHEST  2 VIEW    COMPARISON:  CT chest 07/23/2014 .    FINDINGS:  Mediastinum and hilar structures normal. Heart size normal.  Persistent patchy bilateral pulmonary infiltrates remain. No  improvement. These could be inflammatory or infectious. No acute  bony abnormality.     IMPRESSION:  Persistent prominent bilateral patchypulmonary infiltrates. No  interim change from prior chest CT of 07/23/2014.      Electronically Signed    By: Marcello Moores  Register    On: 07/26/2014 15:55         Verified By: Osa Craver, M.D., MD    27-Jan-16 13:00, Chest Portable Single View  Chest Portable Single View  REASON FOR EXAM:    assess for pleural effusion  COMMENTS:       PROCEDURE: DXR - DXR PORTABLE CHEST SINGLE VIEW  - Aug 08 2014  1:00PM     CLINICAL DATA:  Postoperative patient, SS or  pleural effusion    EXAM:  PORTABLE CHEST - 1 VIEW    COMPARISON: PA and lateral chest x-ray of July 26, 2014    FINDINGS:  The lungs are mildly hypoinflated. The interstitial markings are  increased bilaterally. The left retrocardiac region is dense. The  observed bony thorax is unremarkable. There is no significant  pleural effusion. There is no pneumothorax. The right-sided chest  tube tip projects over the posterior aspect of the third rib. The  cardiac silhouette is enlarged. The pulmonary vascularity is  engorged.     IMPRESSION:  1. There is no significant pleural effusion nor evidence of a  pneumothorax. The right-sided chest tube is in appropriate position.  2. CHF with mild pulmonary interstitial edema.  3. Left lower lobe atelectasis or pneumonia.      Electronically Signed    By: David  Martinique    On: 08/08/2014 15:40         Verified By: DAVID A. [place], M.D., MD    28-Jan-16 06:20, Chest Portable Single View  Chest  Portable Single View  REASON FOR EXAM:    Assess for Pleural Effusion  COMMENTS:       PROCEDURE: DXR - DXR PORTABLE CHEST SINGLE VIEW  - Aug 09 2014  6:20AM     CLINICAL DATA:  Reassess pleural effusions    EXAM:  PORTABLE CHEST - 1 VIEW    COMPARISON:  PA and lateral chest of July 26, 2014 and portable  chest x-ray of August 08, 2014.    FINDINGS:  The lungs remain mildly hypoinflated. There is persistent bibasilar  atelectasis. The pulmonary interstitial markings are less  conspicuous today. There is no significant pleural effusion and  there is no pneumothorax. The right-sided chest tube tip projects  over the posterior aspect of the right fourth rib slightly more  inferior than on yesterday's study. The cardiac silhouette remains  enlarged. The central pulmonary vascularity is more distinct today.     IMPRESSION:  1. There is no significant pleural effusion nor evidence of a  pneumothorax. The right-sided chest tube is  slightly more inferiorly  positioned today.  2. Improved appearance of the pulmonary interstitium and decreased  pulmonary vascular congestion consistent with resolving CHF.    Electronically Signed    By: David  [place]    On: 08/09/2014 07:58         Verified By: DAVID A. [place], M.D., MD    29-Jan-16 06:24, Chest Portable Single View  Chest Portable Single View  REASON FOR EXAM:    Assess for Pleural Effusion  COMMENTS:       PROCEDURE: DXR - DXR PORTABLE CHEST SINGLE VIEW  - Aug 10 2014  6:24AM     CLINICAL DATA:  Right-sided chest tube.    EXAM:  PORTABLE CHEST - 1 VIEW    COMPARISON:  08/09/2014.    FINDINGS:  Right chest tube in stable position. No pneumothorax or significant  pleural effusion. Mediastinum hilar structures normal. Bibasilar  subsegmental atelectasis. Stable cardiomegaly. No pulmonary venous  congestion noted on today's exam. No acutebony abnormality. Right  chest wall subcutaneous emphysema.     IMPRESSION:  1. Right chest tube in stable position. No pneumothorax. No pleural  effusion.  2. Bibasilar subsegmental atelectasis. No new focal infiltrate.  Heart size is stable. No pulmonary venous congestion on today's exam  .      Electronically Signed    By: Marcello Moores  Register    On: 08/10/2014 07:44     Verified By: Osa Craver, M.D., MD    30-Jan-16 07:56, Chest PA and Lateral  Chest PA and Lateral  REASON FOR EXAM:    assess for pleural effusion  COMMENTS:       PROCEDURE: DXR - DXR CHEST PA (OR AP) AND LATERAL  - Aug 11 2014  7:56AM     CLINICAL DATA:  Right side chest tube.    EXAM:  CHEST - 2 VIEW    COMPARISON:  08/10/2014    FINDINGS:  Right chest tube remains directed towards the suprahilar region. No  pneumothorax or significant pleural effusion. Patchy airspace  opacities in both lungs involving bases more than apices, left  greater than right, stable. Heart size upper limits normal. Regional  bones unremarkable.  Surgical clips right upper abdomen.     IMPRESSION:  1. Stable right chest tube; no pneumothorax.  2. Persistent patchy asymmetric airspace disease left greater than  right.      Electronically Signed    By: Delories Heinz.D.  On: 08/11/2014 08:45       Verified By: Kandis Cocking, M.D.,    31-Jan-16 09:03, Chest PA and Lateral  Chest PA and Lateral  REASON FOR EXAM:    postop thoracoscopy  COMMENTS:       PROCEDURE: DXR - DXR CHEST PA (OR AP) AND LATERAL  - Aug 12 2014  9:03AM     CLINICAL DATA:  Post thoracoscopy    EXAM:  CHEST - 2 VIEW    COMPARISON:  the previous day's study    FINDINGS:  Interval removal of the right chest tube. No pneumothorax. Minimal  residual right lateral subcutaneous emphysema.  Low lung volumes. Scattered patchy airspace opacities most marked in  the lung bases, slightly increased since previous exam. Heart size  upper limits normal. Small bilateral pleural effusions. Visualized  skeletal structures are unremarkable. Surgical clips in the right  upper abdomen.     IMPRESSION:  1. Right chest tube removal with no pneumothorax.  2. Some increase in patchy bibasilar airspace disease, with small  effusions.      Electronically Signed    By: Arne Cleveland M.D.    On: 08/12/2014 10:13     Verified By: Kandis Cocking, M.D.,    05-Feb-16 01:16, Chest PA and Lateral  Chest PA and Lateral  REASON FOR EXAM:    recent right lung biopsy; persistent right side pain  COMMENTS:       PROCEDURE: DXR - DXR CHEST PA (OR AP) AND LATERAL  - Aug 17 2014  1:16AM     CLINICAL DATA:  Persistent right-sided chest pain after recent right  lung biopsy.    EXAM:  CHEST  2 VIEW    COMPARISON:  Radiographs 08/12/2014.  CT 07/23/2014    FINDINGS:  There is no pneumothorax. Improved bibasilar aeration compared to  prior exam. Ill-defined linear in geographic multifocal parenchymal  opacities, unchanged minimally improved from prior exam. There  is  minimal blunting of right costophrenic angle, this appears similar  to prior exam. No large pleural effusion. Cardiomediastinal contours  are normal.     IMPRESSION:  Improved bibasilar aeration from prior exam. Multifocal chronic lung  disease again seen. There is no pneumothorax.      Electronically Signed    By: Jeb Levering M.D.    On: 08/17/2014 02:17     Verified By: Rollene Fare. Marisue Humble, M.D.,  [place]:    05-Feb-16 12:35, [place] Color Flow Doppler Low Extrem Bilat (Legs)  [place] Color Flow Doppler Low Extrem Bilat (Legs)  REASON FOR EXAM:    Positive Known Pulmonary Embolus  COMMENTS:       PROCEDURE: [place]  - US DOPPLER LOW EXTR BILATERAL  - Aug 17 2014 12:35PM     CLINICAL DATA:  Pulmonary emboli    EXAM:  BILATERAL LOWER EXTREMITY VENOUS DOPPLER ULTRASOUND    TECHNIQUE:  Gray-scale sonography with compression, as well as color and duplex  ultrasound, were performed to evaluate the deep venous system from  the level of the common femoral vein through the popliteal and  proximal calf veins.  COMPARISON:  None    FINDINGS:  On the right, the popliteal vein is incompletely compressible with  absent flow signal. Patent femoral, deep femoral, and common femoral  veins.    On the left, Normal compressibility of the common femoral,  superficial femoral, and popliteal veins, as well as the proximal  calf veins. No filling defects to suggest DVT  on grayscale or color  Doppler imaging. Doppler waveforms show normal direction of venous  flow, normal respiratory phasicity and response to augmentation.     IMPRESSION:  1. Occlusive right popliteal DVT.  2. No evidence of left lower extremity DVT.      Electronically Signed    By: Arne Cleveland M.D.    On: 08/17/2014 13:17         Verified By: Kandis Cocking, M.D.,  [place]:    01-Aug-15 01:26, Chest PA and Lateral  PACS Image    04-Sep-15 22:47, Chest PA and Lateral  PACS Image    27-Sep-15 11:15, Chest PA and  Lateral  PACS Image    27-Sep-15 13:22, CT Chest Without Contrast  PACS Image    27-Jan-16 13:00, Chest Portable Single View  PACS Image    28-Jan-16 06:20, Chest Portable Single View  PACS Image    29-Jan-16 06:24, Chest Portable Single View  PACS Image    30-Jan-16 07:56, Chest PA and Lateral  PACS Image    31-Jan-16 09:03, Chest PA and Lateral  PACS Image    05-Feb-16 01:16, Chest PA and Lateral  PACS Image    05-Feb-16 03:45, CT ANGIOGRAPHY Chest with for PE  PACS Image    05-Feb-16 12:35, [place] Color Flow Doppler Low Extrem Bilat (Legs)  PACS Image  CT:    27-Sep-15 13:22, CT Chest Without Contrast  CT Chest Without Contrast  REASON FOR EXAM:    pneumonia x 1 month  COMMENTS:       PROCEDURE: CT  - CT CHEST WITHOUT CONTRAST  - Apr 08 2014  1:22PM     CLINICAL DATA:  Cough and shortness of breath.    EXAM:  CT CHEST WITHOUT CONTRAST    TECHNIQUE:  Multidetector CT imaging of the chest was performed following the  standard protocol without IV contrast..    COMPARISON:  Chest CT dated 02/13/2004, chest radiograph 02/10/2014  and chest radiograph 04/06/2014    FINDINGS:  Images of the abdomen demonstrate a 2.7 cm low-density structure in  left hepatic lobe. This probably represents a cyst. The gallbladder  has been removed. No significant pericardial or pleural fluid. No  significant chest lymphadenopathy. There is soft tissue just  anterior to the left thyroid lobe which is probably separate from  the thyroid tissue.    The trachea and mainstem bronchi are patent. The trachea and  mainstem bronchi are patent. There are scattered parenchymal lung  densities throughout both lungs. The disease is predominantly along  the periphery of the lungs. These areas of consolidation are  irregular and there are some air bronchograms, particularly in the  lower lobes. These changes are new from the prior CT. There is also  a well-defined cystic structure in posterior rightlower  lobe  measuring 1.6 cm. No evidence for honeycombing.    No acute bone abnormality.     IMPRESSION:  Scattered parenchymal densities throughout both lungs. The  parenchymal disease has a peripheral predominance. There has been  evidence for this process since 02/10/2014. Findings raise concern  for a chronic inflammatory process and the differential diagnosis  includes organizing pneumonia, chronic eosinophilic pneumonia and  nonspecific interstitial pneumonia. Recommend pulmonary  consultation.  Electronically Signed    By: Markus Daft M.D.    On: 04/08/2014 15:08         Verified By: Burman Riis, M.D.,    11-Jan-16 13:15, CT Chest Without Contrast  CT Chest Without  Contrast  REASON FOR EXAM:    Persistant Cough   Abnormal Chest Xray  COMMENTS:       PROCEDURE: MCT - MCT CHEST WITHOUT CONTRAST  - Jul 23 2014  1:15PM     CLINICAL DATA:  Persistent cough. Abnormal chest radiograph. No  history of primary malignancy.    EXAM:  CT CHEST WITHOUT CONTRAST    TECHNIQUE:  Multidetector CT imaging of the chest was performed following the  standard protocol without IV contrast..  COMPARISON:  chest CT of 04/08/2014.    FINDINGS:  Mediastinum/Nodes: Normal heart size, without pericardial effusion.  Pulmonary artery enlargement, outflow tract measuring 3.1 cm. No  mediastinal or definite hilar adenopathy, given limitations of  unenhanced CT.    Lungs/Pleura: Bibasilar volume loss. Focal bulla at the right lung  base of approximately 1.6 cm which is similar. No change in  peripheral predominant ground-glass and airspace opacities. No  nodularity, traction bronchiectasis. No subpleural reticulation.    No pleural fluid.  Upper abdomen: Left hepatic lobe cyst. Cholecystectomy. Normal  imaged portions of the spleen, stomach, pancreas, adrenal glands,  kidneys.    Musculoskeletal: No acute osseous abnormality.     IMPRESSION:  1. No significant change in peripheral  predominant ground-glass and  airspace opacities. Favor a chronic inflammatory process, most  likely organizing pneumonia. Eosinophilic pneumonia felt less  likely.  2. Pulmonary artery enlargement suggests pulmonary arterial  hypertension.    Electronically Signed    By: Abigail Miyamoto M.D.    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 Other medical issues as above   Plan Has been placed on anticoagulation and so far after about 24 hours has tolerated that well.  Has weaned to RA and breathing comfortably, so pulmonary reserve appears good.  Not highly symptomatic from residual RLE DVT and tolerating anticoagulation so far.  Discussed options including filter placement currently.  This would be a reasonable option, but not a strong indication for this.  She will continue anticoagulation as primary treatment without IVC filter placement at this time.  We discussed that propogation on anticoagulation, bleeding, or new clots on blood thinners would all necessitate IVC filter placement in the future.  She voices her understanding.  I will be happy to see her in the office in 3-4 weeks in follow up if desired to monitor need for filter in the future.  No other in house recs from vascular POV.  OK to discharge if primary service desires.   level 4 consult   Electronic Signatures: Algernon Huxley (MD)  (Signed 06-Feb-16 13:30)  Authored: Chief Complaint and History, PAST MEDICAL/SURGICAL HISTORY, ALLERGIES, HOME MEDICATIONS, Family and Social History, Review of Systems, Physical Exam, LABS, RADIOLOGY, Assessment and Plan   Last Updated: 06-Feb-16 13:30 by Algernon Huxley (MD)

## 2014-11-11 NOTE — Consult Note (Signed)
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   Orders entered.   Comments: DICTATED NOTE TO FOLLOW  BRIEFLY.Marland Kitchen CHECK ANTI XA LEVEL, ORDERED FOR 9 PM TONIGHT, AND PRN TITRATE LOVENOX. LATER ORAL ANTICOAGULATION. INITIALLY CHECKING LUPUS ANTICOAGULANT, FACTOR V LIDFEN AND PROTHROMBIN MUTATION. CAN CHECK ADDITIONAL COAG STUDIES LATER. THIS LOOKS LIKE PROVOKED CLOT, DUE TO SURGERY 1/29, PLUS PATIENT REPORTS VERY IMMOBILE FOR DAYS POST OP,, CONTRIBUTING FACTOR OF HIGH BMI. MINIMAL ANTICOAGULATION 6 MONTHS, DECISION FOR LENGTH OF ANTICOAGULATION AFTER 6 MONTHS. ADVISING MAMMOGRAM, AS AGE APPROPRIATE CANCER SCREEINING. WOULD START PO IRON, FERROUS GLUCONATE, ONE DAILY, , WITH STOOL SOFTENERS, INCREASE AS ABLE , WATCH HX OF CONSTIPATION ON PO IRON. F/U HGB, AND F/U PLTS WHILE ON LOVENOX, LATER RECHECK WBC.  Electronic Signatures: Dallas Schimke (MD)  (Signed 05-Feb-16 19:47)  Authored: Brief Consult Note   Last Updated: 05-Feb-16 19:47 by Dallas Schimke (MD)

## 2014-11-11 NOTE — Discharge Summary (Signed)
PATIENT NAME:  Lisa [name], Lisa [name] MR#:  [id number] DATE OF BIRTH:  1971-03-29  DATE OF ADMISSION:  08/08/2014 DATE OF DISCHARGE:  08/13/2014  ADMITTING DIAGNOSIS: Interstitial lung disease.   DISCHARGE DIAGNOSIS: Interstitial lung disease.   HOSPITAL COURSE: [name] is a 44 year old African American woman who was brought to the operating room on August 08, 2014 where she underwent a right-sided thoracoscopy with multiple biopsies of her right middle and lower lobes. She was admitted with the preoperative diagnosis of interstitial lung disease and her postoperative course was essentially unremarkable. She did have some issues with pain control, but ultimately was able to ambulate without difficulty and had her chest tubes removed on August 13, 2014. Her chest tube sites were healing as expected. Her post chest tube removal x-ray looked fine without any evidence of pneumothorax or pleural effusion. The final pathology report, which was sent to [facility], revealed a nonspecific interstitial pneumonitis. She was discharged to home with follow-up with Dr. Genevive Bi in 1 week. In addition, she will continue her follow-up with Dr. Raul Del.   ____________________________ Lew Dawes. Genevive Bi, MD teo:sb D: 08/27/2014 09:41:00 ET T: 08/27/2014 10:33:45 ET JOB#: [id number]  cc: Lew Dawes. Genevive Bi, MD, <Dictator> Louis Matte MD ELECTRONICALLY SIGNED 08/28/2014 7:50

## 2014-11-11 NOTE — Discharge Summary (Signed)
PATIENT NAME:  Lisa [name], Lisa [name] MR#:  [id number] DATE OF BIRTH:  [birth year]  DATE OF ADMISSION:  08/17/2014 DATE OF DISCHARGE:  08/18/2014  ADMITTING DIAGNOSIS: Pulmonary embolism.  DISCHARGE DIAGNOSES:  1. Pulmonary embolism. 2. Occlusive right popliteal deep vein thrombosis.  3. Interstitial lung disease, status post recent right video-assisted thoracoscopic surgery and right middle lobe and right lower lobe  biopsy on 08/08/2014.  4. Chronic anemia.  5. History of asthma.  6. Suspected to have interstitial lung disease, hypertension, anemia, kidney stones, as well as depression.   DISCHARGE CONDITION: Stable.   DISCHARGE MEDICATIONS: The patient is to continue Ventolin 2 puffs 4 times daily as needed, omeprazole 20 mg p.o. twice daily, Percocet 5/325 mg 1 tablet every 6 hours as needed, Eliquis 10 mg p.o. twice daily for 7 days, then 5 mg twice daily. The patient is not to take HCTZ unless recommended by primary care physician.   HOME OXYGEN: None.   DIET: Low-salt, low-fat, low-cholesterol, low-calorie diet. The patient was advised to lose weight if possible. Regular consistency.   ACTIVITY LIMITATIONS: As tolerated.    FOLLOWUP APPOINTMENTS: Primary physician in 2 days after discharge. Dr. Inez Pilgrim in 2 days after discharge. Dr. Genevive Bi per prior schedule in 1 week after discharge and Dr. Lucky Cowboy in 1 week after discharge.  CONSULTANTS: Care management, social work, Dr. Lucky Cowboy, Dr. Inez Pilgrim, as well as Dr. Genevive Bi.   RADIOLOGIC STUDIES: Chest x-ray PA and lateral  revealing improved bibasilar aeration from prior exam. Multifocal chronic lung disease again seen. There is no pneumothorax. CT angiogram of the chest for pulmonary embolism showed PE with  CT evidence of right heart strain, consistent with at least submassive (intermediate risk) PE., The presence of right heart strain has been associated with an increased risk of morbidity and mortality. There are emboli in the lingula, left lower lobe and  right apical segmental pulmonary arteries. Multifocal parenchyma opacities similar to prior exam may reflect organizing  pneumonia, however, correlation with recent biopsy was recommended. A small amount of right chest wall subcutaneous emphysema consistent with recent biopsy. No pneumothorax. Lower extremity Dopplers revealed occlusive right popliteal DVT. No evidence of left lower extremity DVT.   HISTORY OF PRESENT ILLNESS: The patient is a 44 year old African American female with past medical history significant for anemia, asthma, suspected interstitial lung disease, who had a VATS  procedure with right middle  as well as right lower lobe biopsies on 08/08/2014 by Dr. Genevive Bi. She presents to the hospital with complaints of right-sided chest pains, as well as shortness of breath. Please refer to Dr. Reddy's admission note done on 08/17/2014. On arrival to the hospital, the patient's temperature was 98.2, pulse was 104, respiration rate was 18, blood pressure 133/77, saturation was 95% on room air.   PHYSICAL EXAMINATION: Unremarkable.   LABORATORY DATA: The patient's lab data done on arrival to the hospital showed normal BMP. Liver enzymes were remarkable for albumin level of 2.5. Cardiac enzymes first set was normal. White blood cell count was elevated to 15.7, hemoglobin was 9.2, platelet count 189,000. MCV was low at 72. Absolute neutrophil count was 10.6. Coagulation panel was unremarkable. The patient's urinalysis was remarkable for 5 epithelial cells, less than 1 white blood cell, 1 red blood cell.   The patient was admitted to the hospital for further evaluation. Because of PE, she was started on Lovenox initially. She was consulted by a vascular surgeon for her DVT, as well as pulmonary embolism and oncologist for hypercoagulable  workup. Dr. Inez Pilgrim saw patient in consultation on 08/17/2014. He felt the patient would need to have to some labs done, such as lupus anticoagulant, factor V Leiden and  prothrombin mutation studies.  He felt that the patient's DVT and PE were very likely provoked clot due to surgery in January, plus the patient reported being very immobile for 4 days postoperatively. Contributing factor was high BMI. He recommended minimal anticoagulation for 6 months. Decision of length of anticoagulation after 6 months would appear to be dependent on her overall condition. Dr. Inez Pilgrim recommended a mammogram, also age-appropriate cancer screening. He wants also start the patient on iron supplementation, iron gluconate once a day. The patient was evaluated by Dr. Lucky Cowboy, who discussed with her possible IVC filter placement. Dr. Lucky Cowboy discussed  that propagation of anticoagulation, bleeding or new clots on blood thinners may necessitate IVC filter placement in the future. The patient voiced understanding and she is to follow up with Dr. Lucky Cowboy in the next few weeks after discharge.  On the day of discharge, 08/18/2014, she felt satisfactory, did not complain of any significant discomfort. She was not short of breath and her oxygen saturation remained stable, as well as her heart rate and blood pressure.   Her vital signs on the day of discharge, 08/18/2014: Temperature was 98.6, pulse 80s to 90s, respiration rate was 18 to 20, blood pressure 127/78, saturation was 94% to 95% on room air at rest.   The patient did have an echocardiogram done; however, those results are still pending at the time of dictation.   She is to continue Eliquis loading dose for 7 days, 10 mg twice daily dose, and then 5 mg twice daily dose continuous therapy. For her chronic medical problems such as asthma, hypertension, anemia, bipolar depression, she is to continue her outpatient medications. No changes were made. The patient was advised to hold her HCTZ due to her relatively low blood pressure still. Her HCTZ should be resumed by her primary care physician or physician who is going to follow her along in the next few days  after discharge, depending on overall medical condition and her hydration status as well as of her blood pressure readings.   She is being discharged in stable condition with the above-mentioned medications and followup.  TIME SPENT:  40 minutes.  ____________________________ Theodoro Grist, MD rv:ap D: 08/18/2014 16:29:47 ET T: 08/18/2014 17:21:18 ET JOB#: [id number]  cc: Theodoro Grist, MD, <Dictator> Simonne Come. Inez Pilgrim, MD Lew Dawes Genevive Bi, MD Algernon Huxley, MD  [place] MD ELECTRONICALLY SIGNED 09/04/2014 21:58

## 2014-11-11 NOTE — Op Note (Signed)
PATIENT NAME:  Lisa [name], Lisa [name] MR#:  [id number] DATE OF BIRTH:  [birth year]  DATE OF PROCEDURE:  08/08/2014  SURGEON: Nestor Lewandowsky, MD.   ASSISTANT: Phoebe Perch, MD.   PREOPERATIVE DIAGNOSIS: Interstitial lung disease.   POSTOPERATIVE DIAGNOSIS: Interstitial lung disease.   OPERATION PERFORMED:  1. Preoperative bronchoscopy to assess endobronchial anatomy.  2. Right thoracoscopy with biopsy of right middle and right lower lobes.   INDICATIONS FOR PROCEDURE: [name] is a 44 year old female with recurrent symptoms of cough and shortness of breath. She has had an extensive evaluation by Dr. Raul Del and this has revealed the possibility of interstitial lung disease and she was offered the above-named procedure for definitive diagnosis. The indications and risks were explained to the patient, who gave informed consent.   DESCRIPTION OF PROCEDURE: The patient was brought to the operating suite and placed in the supine position. General endotracheal anesthesia was given through a double-lumen tube. Preoperative bronchoscopy was carried out. This was normal to the subsegmental levels bilaterally. There is no evidence of tumor or purulent secretions. The patient was then turned for a right thoracoscopy. All pressure points were carefully padded. Three thoracic ports were created in a triangular fashion on the lower aspect of the right chest wall. We began anteriorly and superiorly with our first port and then under direct visualization placed the posterior and inferior ports. The patient's very large size made it somewhat difficult to place the trocars and we were able to visualize the lung. The external appearance of the lung was normal. There was no evidence of any disease in the pleural space. The tip of the right middle lobe was biopsied using an endoscopic stapler and the inferior aspect of the posterior right lower lobe was biopsied using the endoscopic stapler. A single chest tube was  positioned anteriorly and brought out through a separate stab wound. The chest was irrigated and hemostasis was complete. The wounds were then closed with multiple layers of running absorbable sutures. Nylon closed the skin. The patient was then rolled in the supine position where she was extubated and taken to the recovery room in stable condition.    ____________________________ Lew Dawes Genevive Bi, MD teo:bu D: 08/08/2014 12:39:46 ET T: 08/08/2014 17:22:50 ET JOB#: [id number]  cc: Christia Reading E. Genevive Bi, MD, <Dictator> Louis Matte MD ELECTRONICALLY SIGNED 08/27/2014 9:56

## 2014-11-12 ENCOUNTER — Ambulatory Visit
Admission: RE | Admit: 2014-11-12 | Discharge: 2014-11-12 | Disposition: A | Discharge status: Home / Self Care | Payer: Medicaid Other | Source: Ambulatory Visit | Attending: Internal Medicine | Admitting: Internal Medicine

## 2014-11-12 DIAGNOSIS — M79604 Pain in right leg: Secondary | ICD-10-CM | POA: Diagnosis present

## 2014-11-12 DIAGNOSIS — Z09 Encounter for follow-up examination after completed treatment for conditions other than malignant neoplasm: Secondary | ICD-10-CM | POA: Insufficient documentation

## 2014-11-12 DIAGNOSIS — M7989 Other specified soft tissue disorders: Secondary | ICD-10-CM | POA: Insufficient documentation

## 2014-11-12 DIAGNOSIS — Z86718 Personal history of other venous thrombosis and embolism: Secondary | ICD-10-CM | POA: Insufficient documentation

## 2014-11-13 ENCOUNTER — Inpatient Hospital Stay: Payer: Medicaid Other | Attending: Cardiothoracic Surgery

## 2014-11-13 ENCOUNTER — Other Ambulatory Visit: Payer: Self-pay | Admitting: *Deleted

## 2014-11-13 ENCOUNTER — Encounter: Payer: Self-pay | Admitting: Internal Medicine

## 2014-11-13 DIAGNOSIS — I82409 Acute embolism and thrombosis of unspecified deep veins of unspecified lower extremity: Secondary | ICD-10-CM

## 2014-11-13 DIAGNOSIS — Z86711 Personal history of pulmonary embolism: Secondary | ICD-10-CM | POA: Diagnosis not present

## 2014-11-13 DIAGNOSIS — Z86718 Personal history of other venous thrombosis and embolism: Secondary | ICD-10-CM | POA: Diagnosis not present

## 2014-11-13 DIAGNOSIS — J849 Interstitial pulmonary disease, unspecified: Secondary | ICD-10-CM

## 2014-11-13 LAB — FIBRIN DERIVATIVES D-DIMER (ARMC ONLY): FIBRIN DERIVATIVES D-DIMER (ARMC): 651

## 2014-11-13 NOTE — Progress Notes (Signed)
This encounter was created in error - please disregard.

## 2014-11-21 ENCOUNTER — Other Ambulatory Visit: Payer: Self-pay | Admitting: *Deleted

## 2014-11-21 ENCOUNTER — Telehealth: Payer: Self-pay | Admitting: *Deleted

## 2014-11-21 DIAGNOSIS — D729 Disorder of white blood cells, unspecified: Secondary | ICD-10-CM

## 2014-11-21 DIAGNOSIS — D72828 Other elevated white blood cell count: Secondary | ICD-10-CM

## 2014-11-21 DIAGNOSIS — R971 Elevated cancer antigen 125 [CA 125]: Secondary | ICD-10-CM

## 2014-11-21 NOTE — Telephone Encounter (Signed)
Asking for lab results 

## 2014-11-22 ENCOUNTER — Telehealth: Payer: Self-pay | Admitting: *Deleted

## 2014-11-22 ENCOUNTER — Other Ambulatory Visit: Payer: Self-pay

## 2014-11-22 NOTE — Telephone Encounter (Addendum)
Asking for a call back regarding [place] results

## 2014-12-06 ENCOUNTER — Inpatient Hospital Stay: Payer: Medicaid Other | Admitting: Internal Medicine

## 2014-12-13 ENCOUNTER — Inpatient Hospital Stay: Payer: Medicaid Other | Attending: Cardiothoracic Surgery | Admitting: Family Medicine

## 2014-12-13 ENCOUNTER — Other Ambulatory Visit: Payer: Self-pay

## 2014-12-13 ENCOUNTER — Encounter: Payer: Self-pay | Admitting: Family Medicine

## 2014-12-13 ENCOUNTER — Encounter (INDEPENDENT_AMBULATORY_CARE_PROVIDER_SITE_OTHER): Payer: Self-pay

## 2014-12-13 VITALS — BP 140/95 | HR 64 | Temp 97.7°F | Resp 18 | Wt 288.1 lb

## 2014-12-13 DIAGNOSIS — Z800 Family history of malignant neoplasm of digestive organs: Secondary | ICD-10-CM | POA: Insufficient documentation

## 2014-12-13 DIAGNOSIS — Z801 Family history of malignant neoplasm of trachea, bronchus and lung: Secondary | ICD-10-CM | POA: Insufficient documentation

## 2014-12-13 DIAGNOSIS — D709 Neutropenia, unspecified: Secondary | ICD-10-CM

## 2014-12-13 DIAGNOSIS — I2699 Other pulmonary embolism without acute cor pulmonale: Secondary | ICD-10-CM

## 2014-12-13 DIAGNOSIS — R971 Elevated cancer antigen 125 [CA 125]: Secondary | ICD-10-CM

## 2014-12-13 DIAGNOSIS — G4730 Sleep apnea, unspecified: Secondary | ICD-10-CM | POA: Insufficient documentation

## 2014-12-13 DIAGNOSIS — I82401 Acute embolism and thrombosis of unspecified deep veins of right lower extremity: Secondary | ICD-10-CM

## 2014-12-13 DIAGNOSIS — R5383 Other fatigue: Secondary | ICD-10-CM | POA: Insufficient documentation

## 2014-12-13 DIAGNOSIS — Z22322 Carrier or suspected carrier of Methicillin resistant Staphylococcus aureus: Secondary | ICD-10-CM

## 2014-12-13 DIAGNOSIS — Z7901 Long term (current) use of anticoagulants: Secondary | ICD-10-CM | POA: Diagnosis not present

## 2014-12-13 DIAGNOSIS — D509 Iron deficiency anemia, unspecified: Secondary | ICD-10-CM

## 2014-12-13 DIAGNOSIS — K219 Gastro-esophageal reflux disease without esophagitis: Secondary | ICD-10-CM

## 2014-12-13 DIAGNOSIS — Z86711 Personal history of pulmonary embolism: Secondary | ICD-10-CM | POA: Diagnosis present

## 2014-12-13 DIAGNOSIS — F329 Major depressive disorder, single episode, unspecified: Secondary | ICD-10-CM | POA: Diagnosis not present

## 2014-12-13 DIAGNOSIS — Z79899 Other long term (current) drug therapy: Secondary | ICD-10-CM | POA: Diagnosis not present

## 2014-12-13 DIAGNOSIS — I82409 Acute embolism and thrombosis of unspecified deep veins of unspecified lower extremity: Secondary | ICD-10-CM | POA: Diagnosis not present

## 2014-12-13 DIAGNOSIS — F319 Bipolar disorder, unspecified: Secondary | ICD-10-CM | POA: Diagnosis not present

## 2014-12-13 DIAGNOSIS — F4310 Post-traumatic stress disorder, unspecified: Secondary | ICD-10-CM | POA: Diagnosis not present

## 2014-12-13 DIAGNOSIS — J45909 Unspecified asthma, uncomplicated: Secondary | ICD-10-CM

## 2014-12-13 DIAGNOSIS — Z129 Encounter for screening for malignant neoplasm, site unspecified: Secondary | ICD-10-CM

## 2014-12-13 HISTORY — DX: Acute embolism and thrombosis of unspecified deep veins of unspecified lower extremity: I82.409

## 2014-12-13 HISTORY — DX: Other pulmonary embolism without acute cor pulmonale: I26.99

## 2014-12-13 LAB — COMPREHENSIVE METABOLIC PANEL
ALBUMIN: 3.7 g/dL (ref 3.5–5.0)
ALT: 19 U/L (ref 14–54)
AST: 18 U/L (ref 15–41)
Alkaline Phosphatase: 33 U/L — ABNORMAL LOW (ref 38–126)
Anion gap: 6 (ref 5–15)
BILIRUBIN TOTAL: 0.5 mg/dL (ref 0.3–1.2)
BUN: 12 mg/dL (ref 6–20)
CALCIUM: 8.5 mg/dL — AB (ref 8.9–10.3)
CHLORIDE: 101 mmol/L (ref 101–111)
CO2: 28 mmol/L (ref 22–32)
Creatinine, Ser: 0.74 mg/dL (ref 0.44–1.00)
GFR calc Af Amer: >60 mL/min (ref >60)
GFR calc non Af Amer: >60 mL/min (ref >60)
GLUCOSE: 82 mg/dL (ref 65–99)
POTASSIUM: 3.6 mmol/L (ref 3.5–5.1)
Sodium: 135 mmol/L (ref 135–145)
TOTAL PROTEIN: 7 g/dL (ref 6.5–8.1)

## 2014-12-13 LAB — CBC WITH DIFFERENTIAL/PLATELET
BASOS ABS: 0.1 10*3/uL (ref 0–0.1)
Basophils Relative: 1 %
EOS ABS: 0.2 10*3/uL (ref 0–0.7)
EOS PCT: 1 %
HCT: 39.2 % (ref 35.0–47.0)
HEMOGLOBIN: 12.7 g/dL (ref 12.0–16.0)
Lymphocytes Relative: 23 %
Lymphs Abs: 4.2 10*3/uL — ABNORMAL HIGH (ref 1.0–3.6)
MCH: 27.2 pg (ref 26.0–34.0)
MCHC: 32.4 g/dL (ref 32.0–36.0)
MCV: 84.2 fL (ref 80.0–100.0)
MONOS PCT: 8 %
Monocytes Absolute: 1.4 10*3/uL — ABNORMAL HIGH (ref 0.2–0.9)
NEUTROS PCT: 67 %
Neutro Abs: 12.3 10*3/uL — ABNORMAL HIGH (ref 1.4–6.5)
Platelets: 286 10*3/uL (ref 150–440)
RBC: 4.65 MIL/uL (ref 3.80–5.20)
RDW: 15.5 % — ABNORMAL HIGH (ref 11.5–14.5)
WBC: 18.2 10*3/uL — ABNORMAL HIGH (ref 3.6–11.0)

## 2014-12-13 LAB — IRON AND TIBC
Iron: 48 ug/dL (ref 28–170)
Saturation Ratios: 11 % (ref 10.4–31.8)
TIBC: 433 ug/dL (ref 250–450)
UIBC: 385 ug/dL

## 2014-12-13 LAB — FERRITIN: FERRITIN: 12 ng/mL (ref 11–307)

## 2014-12-13 NOTE — Progress Notes (Signed)
Lisa [name]  [phone]  Fax:([phone]) [place]: 24-Oct-1970  MR#: [id number]  EXH#:[id number]  Patient Care Team: Oneita Hurt, MD as PCP - General (Internal Medicine)  CHIEF COMPLAINT:  Chief Complaint  Patient presents with  . Follow-up    INTERVAL HISTORY:  Patient is here for continued evaluation regarding anticoagulation therapy following a DVT and PE in February 2016. Currently she is on Eliquis. She denies any new issues. Reports that she is expecting a grandchild in the next few weeks and will be [place] of town until August 2016. She has previously been followed by Dr. Inez Pilgrim with a slightly elevated Ca 125, iron deficiency, and mild neutropenia.   REVIEW OF SYSTEMS:   Review of Systems  Constitutional: Negative for fever and chills.  Respiratory: Negative for cough, shortness of breath and wheezing.   Cardiovascular: Negative for chest pain, leg swelling and PND.  Gastrointestinal: Negative for nausea, vomiting, diarrhea and constipation.  Genitourinary: Negative for urgency and frequency.  Musculoskeletal: Negative for back pain, joint pain and falls.  Neurological: Negative for dizziness, seizures and weakness.  Psychiatric/Behavioral: Negative for depression and suicidal ideas. The patient is not nervous/anxious and does not have insomnia.   All other systems reviewed and are negative.   As per HPI. Otherwise, a complete review of systems is negatve.  ONCOLOGY HISTORY:  No history exists.    PAST MEDICAL HISTORY: Past Medical History  Diagnosis Date  . Trouble swallowing   . Change in voice   . Chest pain   . Abdominal pain   . Blood in stool   . Constipation   . Nausea   . Migraines   . Fatigue   . Gallstones 04/10/2011    with biliary pancreatitis  . Dysrhythmia   . Sleep apnea     cpap  . GERD (gastroesophageal reflux disease)   . Anemia     hx of anemic  . Depression   . MRSA carrier   .  Pancreatitis   . Bipolar 1 disorder   . PTSD (post-traumatic stress disorder)   . Asthma   . DVT (deep venous thrombosis) 12/13/2014  . Pulmonary embolism 12/13/2014    PAST SURGICAL HISTORY: Past Surgical History  Procedure Laterality Date  . Nasal polyp surgery      polyp from vocal cords  . Wisdom tooth extraction    . Laparoscopic cholecystectomy w/ cholangiography  04/25/2011    Dr Margot Chimes  . Cholecystectomy    . Dilation and curettage of uterus    . Esophagogastroduodenoscopy  06/02/2011    Procedure: ESOPHAGOGASTRODUODENOSCOPY (EGD);  Surgeon: Inda Castle, MD;  Location: [facility];  Service: Endoscopy;  Laterality: N/A;  . Bravo ph study  06/02/2011    Procedure: BRAVO [place] STUDY;  Surgeon: Inda Castle, MD;  Location: [facility];  Service: Endoscopy;  Laterality: N/A;    FAMILY HISTORY Family History  Problem Relation Age of Onset  . Diabetes Mother   . Pancreatic cancer Mother   . Lung cancer Father   . Anesthesia problems Neg Hx   . Hypotension Neg Hx   . Malignant hyperthermia Neg Hx   . Pseudochol deficiency Neg Hx     GYNECOLOGIC HISTORY:  No LMP recorded.     ADVANCED DIRECTIVES:    HEALTH MAINTENANCE: History  Substance Use Topics  . Smoking status: Never Smoker   . Smokeless tobacco: Never Used  . Alcohol Use: No  Colonoscopy:  PAP:  Bone density:  Lipid panel:  No Known Allergies  Current Outpatient Prescriptions  Medication Sig Dispense Refill  . Asenapine Maleate (SAPHRIS) 10 MG SUBL Place under the tongue.    . benztropine (COGENTIN) 1 MG tablet Take 1 tablet (1 mg total) by mouth 2 (two) times daily. 60 tablet 0  . hydrochlorothiazide (HYDRODIURIL) 25 MG tablet Take 1 tablet (25 mg total) by mouth daily. 30 tablet 0  . HYDROcodone-acetaminophen (NORCO) 5-325 MG per tablet Take 2 tablets by mouth every 6 (six) hours as needed for severe pain. 10 tablet 0  . hydrOXYzine (ATARAX/VISTARIL) 25 MG tablet Take 25 mg by mouth 2 (two)  times daily.    . hydrOXYzine (ATARAX/VISTARIL) 25 MG tablet Take 25 mg by mouth 3 (three) times daily as needed.    Marland Kitchen omeprazole (PRILOSEC) 20 MG capsule Take 1 capsule (20 mg total) by mouth daily. 30 capsule 0  . paliperidone (INVEGA) 6 MG 24 hr tablet Take 1 tablet (6 mg total) by mouth daily. 30 tablet 0  . propranolol (INDERAL) 20 MG tablet Take 1 tablet (20 mg total) by mouth 2 (two) times daily as needed. 60 tablet 0  . topiramate (TOPAMAX) 100 MG tablet Take 1 tablet (100 mg total) by mouth daily. 30 tablet 0   No current facility-administered medications for this visit.    OBJECTIVE: BP 140/95 mmHg  Pulse 64  Temp(Src) 97.7 F (36.5 C) (Tympanic)  Resp 18  Wt 288 lb 2.3 oz (130.7 kg)  SpO2 98%   Body mass index is 44.18 kg/(m^2).    ECOG FS:0 - Asymptomatic  General: Well-developed, well-nourished, no acute distress. Eyes: Pink conjunctiva, anicteric sclera. Lungs: Clear to auscultation bilaterally. Heart: Regular rate and rhythm. No rubs, murmurs, or gallops. Abdomen: Soft, nontender, nondistended. No organomegaly noted, normoactive bowel sounds. Breast: Breast palpated in a circular manner in the sitting and supine positions.  No masses or fullness palpated.  Axilla palpated in both positions with no masses or fullness palpated.  Musculoskeletal: No edema, cyanosis, or clubbing. Neuro: Alert, answering all questions appropriately. Cranial nerves grossly intact. Skin: No rashes or petechiae noted. Psych: Normal affect. Lymphatics: No cervical, calvicular, axillary or inguinal LAD.   LAB RESULTS:     Component Value Date/Time   NA 139 08/17/2014 0436   NA 142 05/10/2013 2145   K 3.6 08/17/2014 0436   K 4.0 05/10/2013 2145   CL 105 08/17/2014 0436   CL 104 05/10/2013 2145   CO2 27 08/17/2014 0436   CO2 29 05/10/2013 2145   GLUCOSE 80 08/17/2014 0436   GLUCOSE 83 05/10/2013 2145   BUN 11 08/17/2014 0436   BUN 9 05/10/2013 2145   CREATININE 0.85 08/17/2014 0436     CREATININE 0.72 05/10/2013 2145   CREATININE 0.78 05/14/2011 0858   CALCIUM 8.4* 08/17/2014 0436   CALCIUM 9.2 05/10/2013 2145   PROT 7.3 08/17/2014 0436   PROT 7.8 05/10/2013 2145   ALBUMIN 2.5* 08/17/2014 0436   ALBUMIN 3.8 05/10/2013 2145   AST 30 08/17/2014 0436   AST 22 05/10/2013 2145   ALT 31 08/17/2014 0436   ALT 29 05/10/2013 2145   ALKPHOS 48 08/17/2014 0436   ALKPHOS 45 05/10/2013 2145   BILITOT 0.2* 05/10/2013 2145   GFRNONAA >60 03/19/2014 0432   GFRNONAA >90 05/10/2013 2145   GFRAA >60 03/19/2014 0432   GFRAA >90 05/10/2013 2145    No results found for: SPEP, UPEP  Lab Results  Component  Value Date   WBC 13.1* 11/01/2014   NEUTROABS 9.9* 11/01/2014   HGB 12.7 11/01/2014   HCT 39.8 11/01/2014   MCV 83 11/01/2014   PLT 220 11/01/2014      Chemistry      Component Value Date/Time   NA 139 08/17/2014 0436   NA 142 05/10/2013 2145   K 3.6 08/17/2014 0436   K 4.0 05/10/2013 2145   CL 105 08/17/2014 0436   CL 104 05/10/2013 2145   CO2 27 08/17/2014 0436   CO2 29 05/10/2013 2145   BUN 11 08/17/2014 0436   BUN 9 05/10/2013 2145   CREATININE 0.85 08/17/2014 0436   CREATININE 0.72 05/10/2013 2145   CREATININE 0.78 05/14/2011 0858      Component Value Date/Time   CALCIUM 8.4* 08/17/2014 0436   CALCIUM 9.2 05/10/2013 2145   ALKPHOS 48 08/17/2014 0436   ALKPHOS 45 05/10/2013 2145   AST 30 08/17/2014 0436   AST 22 05/10/2013 2145   ALT 31 08/17/2014 0436   ALT 29 05/10/2013 2145   BILITOT 0.2* 05/10/2013 2145       No results found for: LABCA2  No components found for: AXENM076  No results for input(s): INR in the last 168 hours.     Component Value Date/Time   COLORURINE YELLOW 04/06/2011 1546   APPEARANCEUR CLEAR 04/06/2011 1546   LABSPEC 1.012 04/06/2011 1546   PHURINE 6.0 04/06/2011 1546   GLUCOSEU NEGATIVE 04/06/2011 1546   HGBUR NEGATIVE 04/06/2011 1546   BILIRUBINUR NEGATIVE 04/06/2011 1546   KETONESUR NEGATIVE 04/06/2011 1546    PROTEINUR NEGATIVE 04/06/2011 1546   UROBILINOGEN 1.0 04/06/2011 1546   NITRITE NEGATIVE 04/06/2011 1546   LEUKOCYTESUR SMALL* 04/06/2011 1546    STUDIES: No results found.  ASSESSMENT:  1. DVT and PE. 2. IDA. 3. Slightly elevated Ca 125.   PLAN:  1. Patient should continue with Eliquis for prevention of further clots. Will require a minimum of 6 months of treatment. 2. She has responded well in the past to iron infusions. Currently labs are stable.  3. Ca 125 continues to be elevated, today at 52.7. As level continues to increase advised patient that a CT scan of the abdomen and pelvis may be considered. Discussed with her but she is currently out of town and will not return from [place] until August. She would like to recheck labs at that time and also schedule CT 1-2 weeks into August.  Patient expressed understanding and was in agreement with this plan. She also understands that She can call clinic at any time with any questions, concerns, or complaints.    No matching staging information was found for the patient.  Evlyn Kanner, NP   12/13/2014 11:54 AM

## 2014-12-14 LAB — CA 125: CA 125: 52.7 U/mL — ABNORMAL HIGH (ref 0.0–38.1)

## 2014-12-14 LAB — CANCER ANTIGEN 19-9: CA 19-9: 15 U/mL (ref 0–35)

## 2014-12-17 ENCOUNTER — Telehealth: Payer: Self-pay | Admitting: Family Medicine

## 2014-12-17 MED ORDER — AMOXICILLIN-POT CLAVULANATE 875-125 MG PO TABS: 1.0000 | ORAL_TABLET | Freq: Two times a day (BID) | ORAL | Status: DC

## 2014-12-17 NOTE — Telephone Encounter (Signed)
Telephone call made to patient to discuss results

## 2014-12-27 ENCOUNTER — Ambulatory Visit: Payer: Self-pay | Admitting: Internal Medicine

## 2015-02-08 ENCOUNTER — Ambulatory Visit: Payer: Medicaid Other | Attending: Specialist

## 2015-02-08 DIAGNOSIS — G4733 Obstructive sleep apnea (adult) (pediatric): Secondary | ICD-10-CM | POA: Diagnosis not present

## 2015-02-08 DIAGNOSIS — G478 Other sleep disorders: Secondary | ICD-10-CM | POA: Insufficient documentation

## 2015-02-08 DIAGNOSIS — Z8249 Family history of ischemic heart disease and other diseases of the circulatory system: Secondary | ICD-10-CM | POA: Diagnosis not present

## 2015-02-08 DIAGNOSIS — Z79899 Other long term (current) drug therapy: Secondary | ICD-10-CM | POA: Diagnosis not present

## 2015-02-08 DIAGNOSIS — Z9981 Dependence on supplemental oxygen: Secondary | ICD-10-CM | POA: Insufficient documentation

## 2015-02-08 DIAGNOSIS — Z8489 Family history of other specified conditions: Secondary | ICD-10-CM | POA: Insufficient documentation

## 2015-02-08 DIAGNOSIS — Z833 Family history of diabetes mellitus: Secondary | ICD-10-CM | POA: Insufficient documentation

## 2015-02-08 DIAGNOSIS — Z800 Family history of malignant neoplasm of digestive organs: Secondary | ICD-10-CM | POA: Insufficient documentation

## 2015-02-08 DIAGNOSIS — Z823 Family history of stroke: Secondary | ICD-10-CM | POA: Diagnosis not present

## 2015-02-08 DIAGNOSIS — Z8051 Family history of malignant neoplasm of kidney: Secondary | ICD-10-CM | POA: Insufficient documentation

## 2015-02-08 DIAGNOSIS — J984 Other disorders of lung: Secondary | ICD-10-CM | POA: Insufficient documentation

## 2015-02-08 DIAGNOSIS — Z801 Family history of malignant neoplasm of trachea, bronchus and lung: Secondary | ICD-10-CM | POA: Diagnosis not present

## 2015-02-08 DIAGNOSIS — R05 Cough: Secondary | ICD-10-CM | POA: Diagnosis not present

## 2015-02-08 DIAGNOSIS — R0683 Snoring: Secondary | ICD-10-CM | POA: Diagnosis not present

## 2015-02-08 DIAGNOSIS — E669 Obesity, unspecified: Secondary | ICD-10-CM | POA: Insufficient documentation

## 2015-02-08 DIAGNOSIS — Z803 Family history of malignant neoplasm of breast: Secondary | ICD-10-CM | POA: Insufficient documentation

## 2015-02-14 ENCOUNTER — Ambulatory Visit: Payer: Self-pay | Admitting: Family Medicine

## 2015-02-15 ENCOUNTER — Inpatient Hospital Stay: Payer: Medicaid Other | Attending: Family Medicine | Admitting: Family Medicine

## 2015-02-15 ENCOUNTER — Encounter: Payer: Self-pay | Admitting: Family Medicine

## 2015-02-15 VITALS — BP 125/86 | HR 97 | Temp 97.3°F | Resp 18 | Ht 68.0 in | Wt 288.6 lb

## 2015-02-15 DIAGNOSIS — F319 Bipolar disorder, unspecified: Secondary | ICD-10-CM | POA: Diagnosis not present

## 2015-02-15 DIAGNOSIS — R140 Abdominal distension (gaseous): Secondary | ICD-10-CM

## 2015-02-15 DIAGNOSIS — Z22322 Carrier or suspected carrier of Methicillin resistant Staphylococcus aureus: Secondary | ICD-10-CM | POA: Diagnosis not present

## 2015-02-15 DIAGNOSIS — J45909 Unspecified asthma, uncomplicated: Secondary | ICD-10-CM

## 2015-02-15 DIAGNOSIS — Z86718 Personal history of other venous thrombosis and embolism: Secondary | ICD-10-CM

## 2015-02-15 DIAGNOSIS — D709 Neutropenia, unspecified: Secondary | ICD-10-CM

## 2015-02-15 DIAGNOSIS — D509 Iron deficiency anemia, unspecified: Secondary | ICD-10-CM | POA: Insufficient documentation

## 2015-02-15 DIAGNOSIS — G4730 Sleep apnea, unspecified: Secondary | ICD-10-CM | POA: Diagnosis not present

## 2015-02-15 DIAGNOSIS — Z8669 Personal history of other diseases of the nervous system and sense organs: Secondary | ICD-10-CM | POA: Diagnosis not present

## 2015-02-15 DIAGNOSIS — Z86711 Personal history of pulmonary embolism: Secondary | ICD-10-CM | POA: Diagnosis not present

## 2015-02-15 DIAGNOSIS — N920 Excessive and frequent menstruation with regular cycle: Secondary | ICD-10-CM | POA: Diagnosis not present

## 2015-02-15 DIAGNOSIS — F329 Major depressive disorder, single episode, unspecified: Secondary | ICD-10-CM

## 2015-02-15 DIAGNOSIS — J849 Interstitial pulmonary disease, unspecified: Secondary | ICD-10-CM | POA: Diagnosis not present

## 2015-02-15 DIAGNOSIS — K219 Gastro-esophageal reflux disease without esophagitis: Secondary | ICD-10-CM | POA: Diagnosis not present

## 2015-02-15 DIAGNOSIS — F4310 Post-traumatic stress disorder, unspecified: Secondary | ICD-10-CM | POA: Insufficient documentation

## 2015-02-15 DIAGNOSIS — R971 Elevated cancer antigen 125 [CA 125]: Secondary | ICD-10-CM | POA: Diagnosis not present

## 2015-02-15 NOTE — Progress Notes (Signed)
[place]  [phone]) [phone]  Fax:([phone]) [place] DOB: 05/22/1971  MR#: [id number]  JYN#:[id number]  Patient Care Team: Oneita Hurt, MD as PCP - General (Internal Medicine)  CHIEF COMPLAINT:  Chief Complaint  Patient presents with  . Follow-up    DVT   Patient is here for continued follow-up regarding an elevated CA-125 level, iron deficiency related to heavy menstrual loss, mild neutropenia, as well as previous DVT and PE in February 2016. Patient also with significant history for interstitial lung disease.  INTERVAL HISTORY:  Patient reports today feeling very well. She is now 6 months out from her DVT and PE in February 2016 and has since stopped her Eliquis as directed. She has been traveling a great deal due to the recent birth of a grandchild. She overall reports feeling very well and denies any Acute complaints today.  REVIEW OF SYSTEMS:   Review of Systems  Constitutional: Negative for fever, chills, weight loss, malaise/fatigue and diaphoresis.  HENT: Negative for congestion, ear discharge, ear pain, hearing loss, nosebleeds, sore throat and tinnitus.   Eyes: Negative for blurred vision, double vision, photophobia, pain, discharge and redness.  Respiratory: Negative for cough, hemoptysis, sputum production, shortness of breath, wheezing and stridor.   Cardiovascular: Negative for chest pain, palpitations, orthopnea, claudication, leg swelling and PND.  Gastrointestinal: Negative for heartburn, nausea, vomiting, abdominal pain, diarrhea, constipation, blood in stool and melena.  Genitourinary: Negative.   Musculoskeletal: Negative.   Skin: Negative.   Neurological: Negative for dizziness, tingling, focal weakness, seizures, weakness and headaches.  Endo/Heme/Allergies: Does not bruise/bleed easily.  Psychiatric/Behavioral: Negative for depression. The patient is not nervous/anxious and does not have insomnia.     As per  HPI. Otherwise, a complete review of systems is negatve.  PAST MEDICAL HISTORY: Past Medical History  Diagnosis Date  . Trouble swallowing   . Change in voice   . Chest pain   . Abdominal pain   . Blood in stool   . Constipation   . Nausea   . Migraines   . Fatigue   . Gallstones 04/10/2011    with biliary pancreatitis  . Dysrhythmia   . Sleep apnea     cpap  . GERD (gastroesophageal reflux disease)   . Anemia     hx of anemic  . Depression   . MRSA carrier   . Pancreatitis   . Bipolar 1 disorder   . PTSD (post-traumatic stress disorder)   . Asthma   . DVT (deep venous thrombosis) 12/13/2014  . Pulmonary embolism 12/13/2014    PAST SURGICAL HISTORY: Past Surgical History  Procedure Laterality Date  . Nasal polyp surgery      polyp from vocal cords  . Wisdom tooth extraction    . Laparoscopic cholecystectomy w/ cholangiography  04/25/2011    Dr Margot Chimes  . Cholecystectomy    . Dilation and curettage of uterus    . Esophagogastroduodenoscopy  06/02/2011    Procedure: ESOPHAGOGASTRODUODENOSCOPY (EGD);  Surgeon: Inda Castle, MD;  Location: [facility];  Service: Endoscopy;  Laterality: N/A;  . Bravo ph study  06/02/2011    Procedure: BRAVO [place] STUDY;  Surgeon: Inda Castle, MD;  Location: [facility];  Service: Endoscopy;  Laterality: N/A;    FAMILY HISTORY Family History  Problem Relation Age of Onset  . Diabetes Mother   . Pancreatic cancer Mother   . Lung cancer Father   . Anesthesia problems Neg Hx   .  Hypotension Neg Hx   . Malignant hyperthermia Neg Hx   . Pseudochol deficiency Neg Hx   . Cancer Paternal Aunt breast    GYNECOLOGIC HISTORY:  No LMP recorded.     ADVANCED DIRECTIVES:    HEALTH MAINTENANCE: History  Substance Use Topics  . Smoking status: Never Smoker   . Smokeless tobacco: Never Used  . Alcohol Use: No     Colonoscopy:  PAP:  Bone density:  Lipid panel:  No Known Allergies  Current Outpatient Prescriptions    Medication Sig Dispense Refill  . albuterol (PROVENTIL HFA;VENTOLIN HFA) 108 (90 BASE) MCG/ACT inhaler Inhale 2 puffs into the lungs every 6 (six) hours as needed for wheezing or shortness of breath.    . azaTHIOprine (IMURAN) 50 MG tablet Take 50 mg by mouth daily.    . ferrous sulfate 325 (65 FE) MG tablet Take 325 mg by mouth daily with breakfast.    . pantoprazole (PROTONIX) 40 MG tablet Take 40 mg by mouth 2 (two) times daily.    . predniSONE (DELTASONE) 10 MG tablet Take 20 mg by mouth daily.     No current facility-administered medications for this visit.    OBJECTIVE: BP 125/86 mmHg  Pulse 97  Temp(Src) 97.3 F (36.3 C) (Tympanic)  Resp 18  Ht '5\' 8"'  (1.727 m)  Wt 288 lb 9.6 oz (130.908 kg)  BMI 43.89 kg/m2   Body mass index is 43.89 kg/(m^2).    ECOG FS:0 - Asymptomatic  General: Well-developed, well-nourished, no acute distress. Eyes: Pink conjunctiva, anicteric sclera. HEENT: Normocephalic, moist mucous membranes, clear oropharnyx. Lungs: Clear to auscultation bilaterally. Heart: Regular rate and rhythm. No rubs, murmurs, or gallops. Abdomen: Soft, nontender, nondistended. No organomegaly noted, normoactive bowel sounds. Musculoskeletal: No edema, cyanosis, or clubbing. Neuro: Alert, answering all questions appropriately. Cranial nerves grossly intact. Skin: No rashes or petechiae noted. Psych: Normal affect.   LAB RESULTS:  No visits with results within 3 Day(s) from this visit. Latest known visit with results is:  Appointment on 12/13/2014  Component Date Value Ref Range Status  . CA 125 12/13/2014 52.7* 0.0 - 38.1 U/mL Final   Comment: (NOTE) .                            **Please note reference interval change** Roche ECLIA methodology Performed At: [facility] [place], [place] [id number] Lindon Romp MD [id number]   . CA 19-9 12/13/2014 15  0 - 35 U/mL Final   Comment: (NOTE) Roche ECLIA methodology Performed At: [facility] [place], [place] [id number] Lindon Romp MD [id number]   . WBC 12/13/2014 18.2* 3.6 - 11.0 K/uL Final  . RBC 12/13/2014 4.65  3.80 - 5.20 MIL/uL Final  . Hemoglobin 12/13/2014 12.7  12.0 - 16.0 g/dL Final  . HCT 12/13/2014 39.2  35.0 - 47.0 % Final  . MCV 12/13/2014 84.2  80.0 - 100.0 fL Final  . MCH 12/13/2014 27.2  26.0 - 34.0 pg Final  . MCHC 12/13/2014 32.4  32.0 - 36.0 g/dL Final  . RDW 12/13/2014 15.5* 11.5 - 14.5 % Final  . Platelets 12/13/2014 286  150 - 440 K/uL Final  . Neutrophils Relative % 12/13/2014 67   Final  . Neutro Abs 12/13/2014 12.3* 1.4 - 6.5 K/uL Final  . Lymphocytes Relative 12/13/2014 23   Final  . Lymphs Abs 12/13/2014 4.2* 1.0 - 3.6 K/uL Final  .  Monocytes Relative 12/13/2014 8   Final  . Monocytes Absolute 12/13/2014 1.4* 0.2 - 0.9 K/uL Final  . Eosinophils Relative 12/13/2014 1   Final  . Eosinophils Absolute 12/13/2014 0.2  0 - 0.7 K/uL Final  . Basophils Relative 12/13/2014 1   Final  . Basophils Absolute 12/13/2014 0.1  0 - 0.1 K/uL Final  . Sodium 12/13/2014 135  135 - 145 mmol/L Final  . Potassium 12/13/2014 3.6  3.5 - 5.1 mmol/L Final  . Chloride 12/13/2014 101  101 - 111 mmol/L Final  . CO2 12/13/2014 28  22 - 32 mmol/L Final  . Glucose, Bld 12/13/2014 82  65 - 99 mg/dL Final  . BUN 12/13/2014 12  6 - 20 mg/dL Final  . Creatinine, Ser 12/13/2014 0.74  0.44 - 1.00 mg/dL Final  . Calcium 12/13/2014 8.5* 8.9 - 10.3 mg/dL Final  . Total Protein 12/13/2014 7.0  6.5 - 8.1 g/dL Final  . Albumin 12/13/2014 3.7  3.5 - 5.0 g/dL Final  . AST 12/13/2014 18  15 - 41 U/L Final  . ALT 12/13/2014 19  14 - 54 U/L Final  . Alkaline Phosphatase 12/13/2014 33* 38 - 126 U/L Final  . Total Bilirubin 12/13/2014 0.5  0.3 - 1.2 mg/dL Final  . GFR calc non Af Amer 12/13/2014 >60  >60 mL/min Final  . GFR calc Af Amer 12/13/2014 >60  >60 mL/min Final   Comment: (NOTE) The eGFR has been calculated using the CKD EPI equation. This  calculation has not been validated in all clinical situations. eGFR's persistently <60 mL/min signify possible Chronic Kidney Disease.   . Anion gap 12/13/2014 6  5 - 15 Final  . Iron 12/13/2014 48  28 - 170 ug/dL Final  . TIBC 12/13/2014 433  250 - 450 ug/dL Final  . Saturation Ratios 12/13/2014 11  10.4 - 31.8 % Final  . UIBC 12/13/2014 385   Final  . Ferritin 12/13/2014 12  11 - 307 ng/mL Final    STUDIES: No results found.  ASSESSMENT:  1. DVT and PE. 2. IDA. 3. Slightly elevated Ca 125.   PLAN:   1. DVT and PE. From February 2016 patient has completed required 6 months of Eliquis and has now discontinued. 2. IDA. She has responded well in the past to iron infusions. Currently labs are stable.  3. Elevated CA 125. Ca 125 continues to be minimally elevated today at 40.7. Previously discussed with patient that if level continues to increase advised patient that a CT scan of the abdomen and pelvis may be considered. As level has dropped from 52.7 patient would like to continue to monitor her levels.  Advised patient we will continue with follow-up in 2 months for reevaluation of lab work.  Patient expressed understanding and was in agreement with this plan. She also understands that She can call clinic at any time with any questions, concerns, or complaints.   Dr. Oliva Bustard was available for consultation and review of plan of care for this patient.   Evlyn Kanner, NP   02/15/2015 4:51 PM

## 2015-02-16 LAB — CA 125: CA 125: 40.7 U/mL — ABNORMAL HIGH (ref 0.0–38.1)

## 2015-02-19 ENCOUNTER — Ambulatory Visit: Payer: Medicaid Other | Attending: Family Medicine

## 2015-02-19 ENCOUNTER — Other Ambulatory Visit: Payer: Self-pay | Admitting: Family Medicine

## 2015-02-19 DIAGNOSIS — R1084 Generalized abdominal pain: Secondary | ICD-10-CM

## 2015-02-20 ENCOUNTER — Ambulatory Visit: Payer: Self-pay | Admitting: Family Medicine

## 2015-02-20 ENCOUNTER — Ambulatory Visit: Payer: Medicaid Other

## 2015-04-16 ENCOUNTER — Inpatient Hospital Stay: Payer: Medicaid Other | Attending: Family Medicine

## 2017-02-27 ENCOUNTER — Emergency Department: Payer: Medicaid Other

## 2017-02-27 ENCOUNTER — Encounter: Payer: Self-pay | Admitting: Emergency Medicine

## 2017-02-27 DIAGNOSIS — R079 Chest pain, unspecified: Secondary | ICD-10-CM | POA: Insufficient documentation

## 2017-02-27 DIAGNOSIS — J4521 Mild intermittent asthma with (acute) exacerbation: Secondary | ICD-10-CM | POA: Insufficient documentation

## 2017-02-27 DIAGNOSIS — R1032 Left lower quadrant pain: Secondary | ICD-10-CM | POA: Diagnosis not present

## 2017-02-27 DIAGNOSIS — R0602 Shortness of breath: Secondary | ICD-10-CM | POA: Diagnosis present

## 2017-02-27 LAB — URINALYSIS, COMPLETE (UACMP) WITH MICROSCOPIC
Bilirubin Urine: NEGATIVE
GLUCOSE, UA: NEGATIVE mg/dL
Hgb urine dipstick: NEGATIVE
KETONES UR: NEGATIVE mg/dL
Nitrite: NEGATIVE
PROTEIN: NEGATIVE mg/dL
Specific Gravity, Urine: 1.006 (ref 1.005–1.030)
pH: 7 (ref 5.0–8.0)

## 2017-02-27 LAB — BASIC METABOLIC PANEL
ANION GAP: 8 (ref 5–15)
BUN: 6 mg/dL (ref 6–20)
CO2: 27 mmol/L (ref 22–32)
Calcium: 9.2 mg/dL (ref 8.9–10.3)
Chloride: 106 mmol/L (ref 101–111)
Creatinine, Ser: 0.64 mg/dL (ref 0.44–1.00)
GFR calc Af Amer: >60 mL/min (ref >60)
GFR calc non Af Amer: >60 mL/min (ref >60)
GLUCOSE: 114 mg/dL — AB (ref 65–99)
POTASSIUM: 4 mmol/L (ref 3.5–5.1)
Sodium: 141 mmol/L (ref 135–145)

## 2017-02-27 LAB — CBC
HEMATOCRIT: 37.7 % (ref 35.0–47.0)
HEMOGLOBIN: 12.5 g/dL (ref 12.0–16.0)
MCH: 28.3 pg (ref 26.0–34.0)
MCHC: 33.2 g/dL (ref 32.0–36.0)
MCV: 85.3 fL (ref 80.0–100.0)
Platelets: 265 10*3/uL (ref 150–440)
RBC: 4.42 MIL/uL (ref 3.80–5.20)
RDW: 15.7 % — ABNORMAL HIGH (ref 11.5–14.5)
WBC: 9.6 10*3/uL (ref 3.6–11.0)

## 2017-02-27 LAB — LIPASE, BLOOD: Lipase: 47 U/L (ref 11–51)

## 2017-02-27 LAB — POCT PREGNANCY, URINE: Preg Test, Ur: NEGATIVE

## 2017-02-27 LAB — TROPONIN I: Troponin I: <0.03 ng/mL (ref <0.03)

## 2017-02-27 NOTE — ED Triage Notes (Signed)
Patient with complaint of chest pain, shortness of breath, right lower back pain and left lower abdominal pain times two days.

## 2017-02-28 ENCOUNTER — Emergency Department: Payer: Medicaid Other

## 2017-02-28 ENCOUNTER — Encounter: Payer: Self-pay | Admitting: Radiology

## 2017-02-28 ENCOUNTER — Emergency Department
Admission: EM | Admit: 2017-02-28 | Discharge: 2017-02-28 | Disposition: A | Discharge status: Home / Self Care | Payer: Medicaid Other | Attending: Emergency Medicine | Admitting: Emergency Medicine

## 2017-02-28 DIAGNOSIS — J4521 Mild intermittent asthma with (acute) exacerbation: Secondary | ICD-10-CM

## 2017-02-28 DIAGNOSIS — R1032 Left lower quadrant pain: Secondary | ICD-10-CM

## 2017-02-28 HISTORY — DX: Diverticulitis of intestine, part unspecified, without perforation or abscess without bleeding: K57.92

## 2017-02-28 LAB — TROPONIN I: Troponin I: <0.03 ng/mL (ref <0.03)

## 2017-02-28 MED ORDER — ALBUTEROL SULFATE HFA 108 (90 BASE) MCG/ACT IN AERS: 2.0000 | INHALATION_SPRAY | Freq: Four times a day (QID) | RESPIRATORY_TRACT | 2 refills | Status: AC | PRN

## 2017-02-28 MED ORDER — IPRATROPIUM-ALBUTEROL 0.5-2.5 (3) MG/3ML IN SOLN
3.0000 mL | Freq: Once | RESPIRATORY_TRACT | Status: AC
  Administered 2017-02-28: 3 mL via RESPIRATORY_TRACT
  Filled 2017-02-28: qty 3

## 2017-02-28 MED ORDER — KETOROLAC TROMETHAMINE 30 MG/ML IJ SOLN
15.0000 mg | Freq: Once | INTRAMUSCULAR | Status: AC
  Administered 2017-02-28: 15 mg via INTRAVENOUS
  Filled 2017-02-28: qty 1

## 2017-02-28 MED ORDER — IOPAMIDOL (ISOVUE-370) INJECTION 76%
100.0000 mL | Freq: Once | INTRAVENOUS | Status: AC | PRN
  Administered 2017-02-28: 100 mL via INTRAVENOUS

## 2017-02-28 MED ORDER — PREDNISONE 20 MG PO TABS: 60.0000 mg | ORAL_TABLET | Freq: Every day | ORAL | 0 refills | Status: AC

## 2017-02-28 NOTE — ED Provider Notes (Signed)
[facility] Emergency Department Provider Note  ____________________________________________  Time seen: Approximately 3:07 AM  I have reviewed the triage vital signs and the nursing notes.   HISTORY  Chief Complaint Back Pain; Abdominal Pain; Chest Pain; and Shortness of Breath   HPI Lisa [name] is a 46 y.o. female with a history of bipolar, DVT/PE not on blood thinners for 2 years, pancreatitis, diverticulitis, anemia, asthma who presents for evaluation of several medical complaints. Patient is complaining of one week of constant moderate central chest tightnessassociated with worsening of her chronic cough, no wheezing, no changes on baseline SOB, no fever or chills. Not pleuritic, not on blood thinners, no hemoptysis, no leg pain or swelling, no hormones.   Patient is also complaining of one week of R flank pain, moderate, constant, dull, not radiating. S/p cholecystectomy and appendectomy.  Patient is also complaining if a few days of LLQ abdominal pain, severe, sharp, intermittent.  Normal BM 3 days ago which is normal for her, no nausea, no vomiting, no diarrhea, no fever.  Past Medical History:  Diagnosis Date  . Abdominal pain   . Anemia    hx of anemic  . Asthma   . Bipolar 1 disorder ([place])   . Blood in stool   . Change in voice   . Chest pain   . Constipation   . Depression   . Diverticulitis   . DVT (deep venous thrombosis) ([place]) 12/13/2014  . Dysrhythmia   . Fatigue   . Gallstones 04/10/2011   with biliary pancreatitis  . GERD (gastroesophageal reflux disease)   . Migraines   . MRSA carrier   . Nausea   . Pancreatitis   . PTSD (post-traumatic stress disorder)   . Pulmonary embolism ([place]) 12/13/2014  . Sleep apnea    cpap  . Trouble swallowing     Patient Active Problem List   Diagnosis Date Noted  . DVT (deep venous thrombosis) ([place]) 12/13/2014  . Pulmonary embolism ([place]) 12/13/2014  . Nonspecific interstitial  pneumonia ([place]) 08/22/2014  . Cough, persistent 04/30/2014  . Obstructive apnea 04/30/2014  . Endometrial polyp 09/30/2013  . High BMI 08/25/2013  . Dysmenorrhea 08/25/2013  . Abdominal bloating 08/25/2013  . Schizoaffective disorder ([place]) 05/12/2013  . PTSD (post-traumatic stress disorder) 05/12/2013  . Bipolar I disorder, most recent episode (or current) depressed, unspecified 05/12/2013  . Abdominal pain, generalized 05/28/2011  . Esophageal reflux 05/28/2011    Past Surgical History:  Procedure Laterality Date  . BRAVO [place] STUDY  06/02/2011   Procedure: BRAVO [place] STUDY;  Surgeon: Inda Castle, MD;  Location: [facility];  Service: Endoscopy;  Laterality: N/A;  . CHOLECYSTECTOMY    . DILATION AND CURETTAGE OF UTERUS    . ESOPHAGOGASTRODUODENOSCOPY  06/02/2011   Procedure: ESOPHAGOGASTRODUODENOSCOPY (EGD);  Surgeon: Inda Castle, MD;  Location: [facility];  Service: Endoscopy;  Laterality: N/A;  . LAPAROSCOPIC CHOLECYSTECTOMY W/ CHOLANGIOGRAPHY  04/25/2011   Dr Margot Chimes  . NASAL POLYP SURGERY     polyp from vocal cords  . WISDOM TOOTH EXTRACTION      Prior to Admission medications   Medication Sig Start Date End Date Taking? Authorizing Provider  albuterol (PROVENTIL HFA;VENTOLIN HFA) 108 (90 BASE) MCG/ACT inhaler Inhale 2 puffs into the lungs every 6 (six) hours as needed for wheezing or shortness of breath.    [provider]  albuterol (PROVENTIL HFA;VENTOLIN HFA) 108 (90 Base) MCG/ACT inhaler Inhale 2 puffs into the lungs every 6 (six)  hours as needed for wheezing or shortness of breath. 02/28/17   Rudene Re, MD  ferrous sulfate 325 (65 FE) MG tablet Take 325 mg by mouth daily with breakfast.    [provider]  predniSONE (DELTASONE) 10 MG tablet Take 20 mg by mouth daily.    [provider]  predniSONE (DELTASONE) 20 MG tablet Take 3 tablets (60 mg total) by mouth daily. 02/28/17 03/04/17  Rudene Re, MD    Allergies Patient  has no known allergies.  Family History  Problem Relation Age of Onset  . Diabetes Mother   . Pancreatic cancer Mother   . Lung cancer Father   . Cancer Paternal Aunt breast  . Anesthesia problems Neg Hx   . Hypotension Neg Hx   . Malignant hyperthermia Neg Hx   . Pseudochol deficiency Neg Hx     Social History Social History  Substance Use Topics  . Smoking status: Never Smoker  . Smokeless tobacco: Never Used  . Alcohol use Yes     Comment: occ    Review of Systems  Constitutional: Negative for fever. Eyes: Negative for visual changes. ENT: Negative for sore throat. Neck: No neck pain  Cardiovascular: + chest tightness Respiratory: Negative for shortness of breath. + cough Gastrointestinal: + LLQ abdominal pain. No vomiting or diarrhea. Genitourinary: Negative for dysuria. + R flank pain Musculoskeletal: Negative for back pain. Skin: Negative for rash. Neurological: Negative for headaches, weakness or numbness. Psych: No SI or HI  ____________________________________________   PHYSICAL EXAM:  VITAL SIGNS: ED Triage Vitals  Enc Vitals Group     BP 02/27/17 2251 (!) 142/75     Pulse Rate 02/27/17 2251 (!) 50     Resp 02/27/17 2251 18     Temp 02/27/17 2251 98.7 F (37.1 C)     Temp Source 02/27/17 2251 Oral     SpO2 02/27/17 2251 97 %     Weight 02/27/17 2251 265 lb (120.2 kg)     Height 02/27/17 2251 5\' 8"  (1.727 m)     Head Circumference --      Peak Flow --      Pain Score 02/27/17 2250 7     Pain Loc --      Pain Edu? --      Excl. in [place]? --     Constitutional: Alert and oriented. Well appearing and in no apparent distress. HEENT:      Head: Normocephalic and atraumatic.         Eyes: Conjunctivae are normal. Sclera is non-icteric.       Mouth/Throat: Mucous membranes are moist.       Neck: Supple with no signs of meningismus. Cardiovascular: Regular rate and rhythm. No murmurs, gallops, or rubs. 2+ symmetrical distal pulses are present in all  extremities. No JVD. Respiratory: Normal respiratory effort. Lungs are clear to auscultation bilaterally with slightly diminished air movement. No wheezes, crackles, or rhonchi.  Gastrointestinal: Soft, diffuse tenderness to palpation worse on the left quadrants, and non distended with positive bowel sounds. No rebound or guarding. Genitourinary: No CVA tenderness. Musculoskeletal: Nontender with normal range of motion in all extremities. No edema, cyanosis, or erythema of extremities. Neurologic: Normal speech and language. Face is symmetric. Moving all extremities. No gross focal neurologic deficits are appreciated. Skin: Skin is warm, dry and intact. No rash noted. Psychiatric: Mood and affect are normal. Speech and behavior are normal.  ____________________________________________   LABS (all labs ordered are listed, but only abnormal  results are displayed)  Labs Reviewed  BASIC METABOLIC PANEL - Abnormal; Notable for the following:       Result Value   Glucose, Bld 114 (*)    All other components within normal limits  CBC - Abnormal; Notable for the following:    RDW 15.7 (*)    All other components within normal limits  URINALYSIS, COMPLETE (UACMP) WITH MICROSCOPIC - Abnormal; Notable for the following:    Color, Urine YELLOW (*)    APPearance CLEAR (*)    Leukocytes, UA TRACE (*)    Bacteria, UA RARE (*)    Squamous Epithelial / LPF 0-5 (*)    All other components within normal limits  URINE CULTURE  TROPONIN I  LIPASE, BLOOD  TROPONIN I  POCT PREGNANCY, URINE   ____________________________________________  EKG  ED ECG REPORT I, Rudene Re, the attending physician, personally viewed and interpreted this ECG.   Normal sinus rhythm, rate of 78, normal intervals normal axis, no ST elevations or depressions, T-wave inversions in V2 and V3 with flattening on inferior leads.o significant changes when compared to prior from  2016 ____________________________________________  RADIOLOGY  CT a/p: negative   CTA chest: negative ____________________________________________   PROCEDURES  Procedure(s) performed: None Procedures Critical Care performed:  None ____________________________________________   INITIAL IMPRESSION / ASSESSMENT AND PLAN / ED COURSE  46 y.o. female with a history of bipolar, DVT/PE not on blood thinners for 2 years, pancreatitis, diverticulitis, anemia, asthma who presents for evaluation of several medical complaints.   # chest tightness: patient with worsening cough but no fever, CXR with possible PNA. Patient with prior h/o PE will send for CTA chest. EKG with no ischemic changes. History not consistent with ACS. Troponin negative  # abdominal pain and flank pain: CT pending to eval for diverticulitis, colitis, SBO, kidney stone. Labs WNL. UA with bacteria but no dysuria, no leukocytosis. Culture pending. Will hold off treatment until culture is back.    _________________________ 5:41 AM on 02/28/2017 -----------------------------------------  Chest tightness resolved after one breathing treatment. Lungs clear and moving great air. Will dc on albuterol and prednisone. No evidence of PNA on CT or PE. CT a/p negative. Labs WNL.recommended close follow-up with primary care doctor for reevaluation of abdominal pain in 24 hours or return to the emergency room for  Symptoms.  Pertinent labs & imaging results that were available during my care of the patient were reviewed by me and considered in my medical decision making (see chart for details).    ____________________________________________   FINAL CLINICAL IMPRESSION(S) / ED DIAGNOSES  Final diagnoses:  Mild intermittent asthma with exacerbation  Left lower quadrant pain      NEW MEDICATIONS STARTED DURING THIS VISIT:  New Prescriptions   ALBUTEROL (PROVENTIL HFA;VENTOLIN HFA) 108 (90 BASE) MCG/ACT INHALER    Inhale 2  puffs into the lungs every 6 (six) hours as needed for wheezing or shortness of breath.   PREDNISONE (DELTASONE) 20 MG TABLET    Take 3 tablets (60 mg total) by mouth daily.     Note:  This document was prepared using Dragon voice recognition software and may include unintentional dictation errors.    Alfred Levins, [place], MD 02/28/17 [phone]

## 2017-02-28 NOTE — Discharge Instructions (Signed)
You have been seen in the Emergency Department (ED) for abdominal pain.  Your evaluation did not identify a clear cause of your symptoms but was generally reassuring.  Abdominal pain has many possible causes. Some aren't serious and get better on their own in a few days. Others need more testing and treatment. If your pain continues or gets worse, you need to be rechecked and may need more tests to find out what is wrong. You may need surgery to correct the problem.   Follow up with your doctor in 12-24 hours if you are still having abdominal pain. Otherwise follow up in 1-3 days for a re-check  Don't ignore new symptoms, such as fever, nausea and vomiting, new or worsening abdominal pain, urination problems, bloody diarrhea or bloody stools, black tarry stools, uncontrollable nausea and vomiting, and dizziness. These may be signs of a more serious problem. If you develop any of these you should be seen by your doctor immediately or return to the ED.   How can you care for yourself at home?  Rest until you feel better.  To prevent dehydration, drink plenty of fluids, enough so that your urine is light yellow or clear like water. Choose water and other caffeine-free clear liquids until you feel better. If you have kidney, heart, or liver disease and have to limit fluids, talk with your doctor before you increase the amount of fluids you drink.  If your stomach is upset, eat mild foods, such as rice, dry toast or crackers, bananas, and applesauce. Try eating several small meals instead of two or three large ones.  Wait until 48 hours after all symptoms have gone away before you have spicy foods, alcohol, and drinks that contain caffeine.  Do not eat foods that are high in fat.  Avoid anti-inflammatory medicines such as aspirin, ibuprofen (Advil, Motrin), and naproxen (Aleve). These can cause stomach upset. Talk to your doctor if you take daily aspirin for another health problem.  When should you call  for help?  Call 911 anytime you think you may need emergency care. For example, call if:  You passed out (lost consciousness).  You pass maroon or very bloody stools.  You vomit blood or what looks like coffee grounds.  You have new, severe belly pain.  Call your doctor now or seek immediate medical care if:  Your pain gets worse, especially if it becomes focused in one area of your belly.  You have a new or higher fever.  Your stools are black and look like tar, or they have streaks of blood.  You have unexpected vaginal bleeding.  You have symptoms of a urinary tract infection. These may include:  Pain when you urinate.  Urinating more often than usual.  Blood in your urine. You are dizzy or lightheaded, or you feel like you may faint. Watch closely for changes in your health, and be sure to contact your doctor if:  You are not getting better after 1 day (24 hours).  _____________________________________________________________________________________ _____________________________________________________________________________________  Lisa [name] were seen in the Emergency Department (ED) today and was diagnosed with an asthma exacerbation.   During an asthma attack, the airways swell and narrow. This makes it hard to breathe. Severe asthma attacks can be life-threatening, but you can help prevent them by keeping your asthma under control and treating symptoms before they get bad. This can also help you avoid future trips to the emergency room.   Follow-up with your doctor in 1 day for further evaluation.  Use albuterol 2 puffs every 4 hours as needed for shortness of breath, wheezing, or cough. Take steroids as prescribed.   When should you call for help?  Call 911 anytime you think you may need emergency care. For example, call if:  You have severe trouble breathing. Call your doctor now or seek immediate medical care if:  Your symptoms do not get better after you have followed your  asthma action plan.  You have new or worse trouble breathing.  Your coughing and wheezing get worse.  You cough up dark brown or bloody mucus (sputum).  You have a new or higher fever. Watch closely for changes in your health, and be sure to contact your doctor if:  You need to use quick-relief medicine on more than 2 days a week (unless it is just for exercise).  You cough more deeply or more often, especially if you notice more mucus or a change in the color of your mucus.  You are not getting better as expected.  How can you care for yourself at home?  Follow your asthma action plan to prevent and treat attacks. If you don't have an asthma action plan, work with your doctor to create one.  Take your asthma medicines exactly as prescribed. Talk to your doctor right away if you have any questions about how to take them.  Use your quick-relief medicine when you have symptoms of an attack. Quick-relief medicine is usually an albuterol inhaler. Some people need to use quick-relief medicine before they exercise.  Take your controller medicine every day, not just when you have symptoms. Controller medicine is usually an inhaled corticosteroid. The goal is to prevent problems before they occur. Don't use your controller medicine to treat an attack that has already started. It doesn't work fast enough to help.  If your doctor prescribed corticosteroid pills to use during an attack, take them exactly as prescribed. It may take hours for the pills to work, but they may make the episode shorter and help you breathe better.  Keep your quick-relief medicine with you at all times. Talk to your doctor before using other medicines. Some medicines, such as aspirin, can cause asthma attacks in some people.  Learn how to use a peak flow meter to check how well you are breathing. This can help you predict when an asthma attack is going to occur. Then you can take medicine to prevent the asthma attack or make it less  severe.  Do not smoke or allow others to smoke around you. Avoid smoky places. Smoking makes asthma worse. If you need help quitting, talk to your doctor about stop-smoking programs and medicines. These can increase your chances of quitting for good.  Learn what triggers an asthma attack for you, and avoid the triggers when you can. Common triggers include colds, smoke, air pollution, dust, pollen, mold, pets, cockroaches, stress, and cold air.  Avoid colds and the flu. Get a pneumococcal vaccine shot. If you have had one before, ask your doctor if you need a second dose. Get a flu vaccine every fall. If you must be around people with colds or the flu, wash your hands often.  To control asthma over the long term   Medicines  Controller medicines reduce swelling in your lungs. They also prevent asthma attacks. Take your controller medicine exactly as prescribed. Talk to your doctor if you have any problems with your medicine.  Inhaled corticosteroid is a common and effective controller medicine. Using it the  right way can prevent or reduce most side effects.  Take your controller medicine every day, not just when you have symptoms. It helps prevent problems before they occur.  Your doctor may prescribe another medicine that you use along with the corticosteroid. This is often a long-acting bronchodilator. Do not take this medicine by itself. Using a long-acting bronchodilator by itself can increase your risk of a severe or fatal asthma attack.  Do not take inhaled corticosteroids or long-acting bronchodilators to stop an asthma attack that has already started. They don't work fast enough to help.  Talk to your doctor before you use other medicines. Some medicines, such as aspirin, can cause asthma attacks in some people.  Education  Learn what triggers an asthma attack. Avoid these triggers when you can. Common triggers include colds, smoke, air pollution, dust, pollen, mold, pets, cockroaches,  stress, and cold air.  If you have a peak flow meter, use it to check how well you are breathing. It can help you know when an asthma attack is going to occur. Then you can take medicine to prevent the asthma attack or make it less severe.  Do not smoke or allow others to smoke around you. Avoid smoky places. Smoking makes asthma worse. If you need help quitting, talk to your doctor about stop-smoking programs and medicines. These can increase your chances of quitting for good.  Avoid colds and the flu. Get a pneumococcal vaccine shot. If you have had one before, ask your doctor whether you need a second dose. Get a flu vaccine every year, as soon as it's available. If you must be around people with colds or the flu, wash your hands often.

## 2017-02-28 NOTE — ED Notes (Signed)
Pt. States chest pain, back pain and lower lt. Side abdominal pain that has been going on for the past week.  Pt. States chronic pneumonia.   Pt. Denies N/V/D.

## 2017-02-28 NOTE — ED Notes (Signed)
Pt. Going home with mother. 

## 2017-03-01 LAB — URINE CULTURE: Culture: <10000 — AB

## 2017-03-05 ENCOUNTER — Encounter: Payer: Self-pay | Admitting: Emergency Medicine

## 2017-03-05 DIAGNOSIS — K625 Hemorrhage of anus and rectum: Secondary | ICD-10-CM | POA: Insufficient documentation

## 2017-03-05 DIAGNOSIS — Z5321 Procedure and treatment not carried out due to patient leaving prior to being seen by health care provider: Secondary | ICD-10-CM | POA: Insufficient documentation

## 2017-03-05 DIAGNOSIS — R109 Unspecified abdominal pain: Secondary | ICD-10-CM | POA: Diagnosis present

## 2017-03-05 LAB — CBC
HCT: 36.6 % (ref 35.0–47.0)
HEMOGLOBIN: 12.2 g/dL (ref 12.0–16.0)
MCH: 27.7 pg (ref 26.0–34.0)
MCHC: 33.3 g/dL (ref 32.0–36.0)
MCV: 83.3 fL (ref 80.0–100.0)
Platelets: 252 10*3/uL (ref 150–440)
RBC: 4.4 MIL/uL (ref 3.80–5.20)
RDW: 15.5 % — ABNORMAL HIGH (ref 11.5–14.5)
WBC: 7.8 10*3/uL (ref 3.6–11.0)

## 2017-03-05 LAB — COMPREHENSIVE METABOLIC PANEL
ALT: 26 U/L (ref 14–54)
ANION GAP: 9 (ref 5–15)
AST: 27 U/L (ref 15–41)
Albumin: 3.9 g/dL (ref 3.5–5.0)
Alkaline Phosphatase: 41 U/L (ref 38–126)
BUN: 6 mg/dL (ref 6–20)
CHLORIDE: 103 mmol/L (ref 101–111)
CO2: 26 mmol/L (ref 22–32)
CREATININE: 0.77 mg/dL (ref 0.44–1.00)
Calcium: 9.3 mg/dL (ref 8.9–10.3)
GFR calc non Af Amer: >60 mL/min (ref >60)
Glucose, Bld: 97 mg/dL (ref 65–99)
Potassium: 3.9 mmol/L (ref 3.5–5.1)
Sodium: 138 mmol/L (ref 135–145)
Total Bilirubin: 0.5 mg/dL (ref 0.3–1.2)
Total Protein: 7.6 g/dL (ref 6.5–8.1)

## 2017-03-05 LAB — TYPE AND SCREEN
ABO/RH(D): B POS
Antibody Screen: NEGATIVE

## 2017-03-05 NOTE — ED Triage Notes (Signed)
Pt arrives ambulatory to triage with c/o lower abdominal pain and rectal bleeding. Pt reports that she started noticing bright red blood yesterday that has continued throughout today. Pt reports blood when she urinates as well as when she has a BM. Pt is in NAD at this time.

## 2017-03-05 NOTE — ED Notes (Signed)
Pt stated that she was feeling light headed to [place] EDT at end of triage. This RN asked [facility] ED[facility] to perform an EKG on pt. Per [facility] EDT, pt refused.

## 2017-03-05 NOTE — ED Notes (Addendum)
Went to lobby to get pt for EKG and she stated that she was fine and did not want the EKG.  Pt advised to let us know if she feels any change in condition while waiting.

## 2017-03-06 ENCOUNTER — Emergency Department
Admission: EM | Admit: 2017-03-06 | Discharge: 2017-03-06 | Disposition: A | Discharge status: Home / Self Care | Payer: Medicaid Other | Attending: Emergency Medicine | Admitting: Emergency Medicine

## 2017-03-06 NOTE — ED Notes (Signed)
Pt updated on waiting and is understanding.

## 2017-03-07 IMAGING — CT CT ANGIO CHEST
2 of 6 series · 18 of 36 positions shown · IV contrast (APPLIED)
Comparison: 09/07/2014

CLINICAL DATA: Right-sided chest pain and dyspnea.

EXAM:
CT ANGIOGRAPHY CHEST WITH CONTRAST
TECHNIQUE: Multidetector CT imaging of the chest was performed using the
standard protocol during bolus administration of intravenous
contrast. Multiplanar CT image reconstructions and MIPs were
obtained to evaluate the vascular anatomy.
CONTRAST:  75 mL Omnipaque 350 intravenous

[Series 5: pe 1.0 thins · axial · 0.68mm/px · z∈[-282,-80]mm · 17 of 229 slices shown]
[im 13/229  lung]
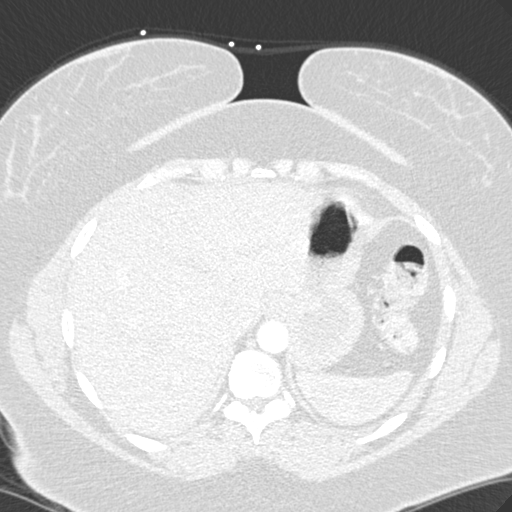
[im 26/229  mediastinal]
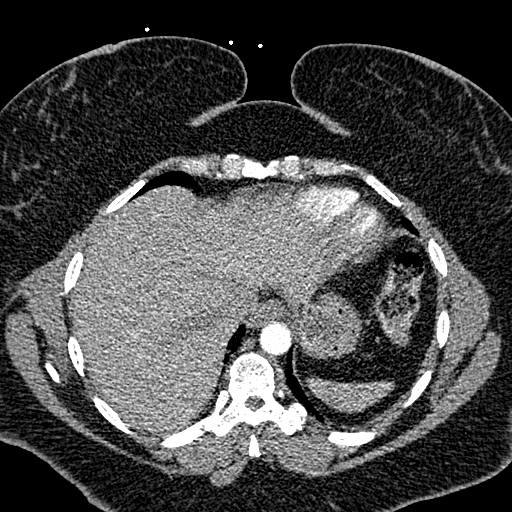
[im 39/229  lung]
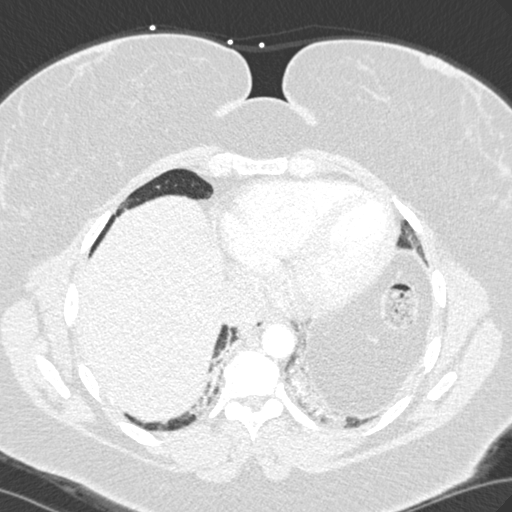
[im 51/229  mediastinal]
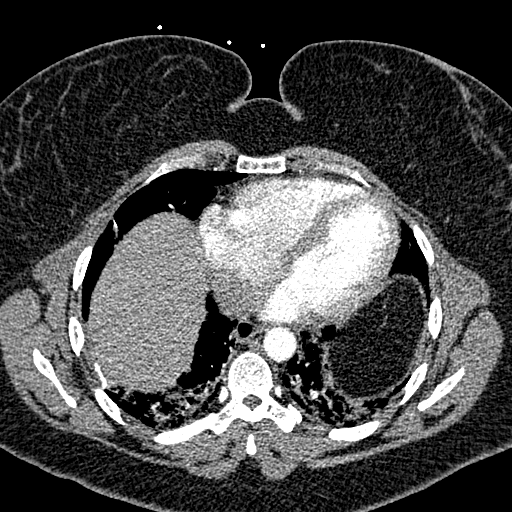
[im 64/229  lung]
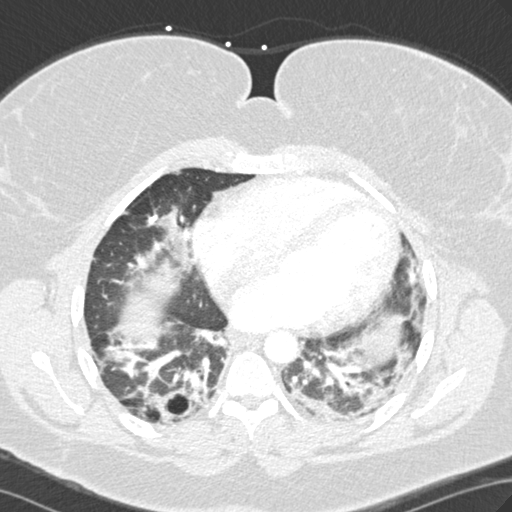
[im 77/229  mediastinal]
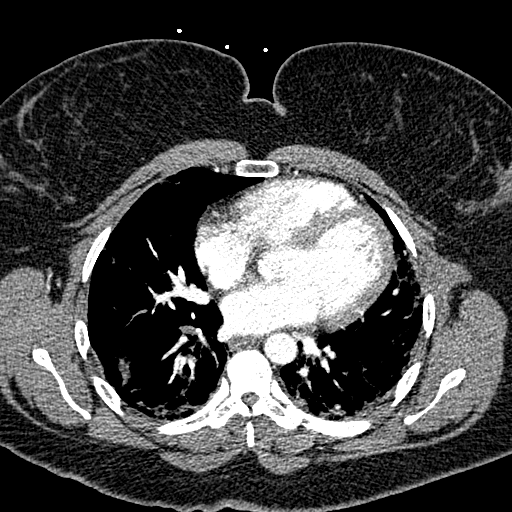
[im 89/229  lung]
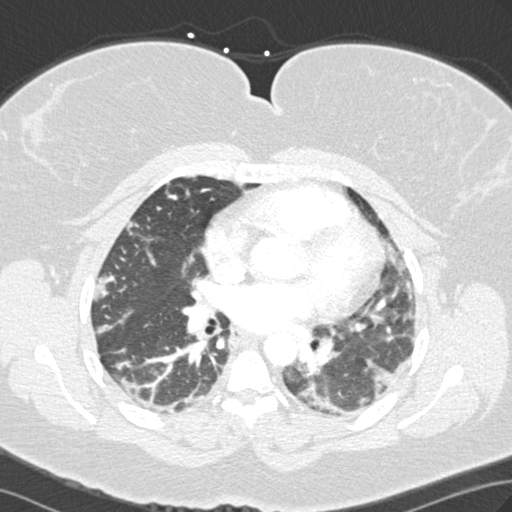
[im 102/229  mediastinal]
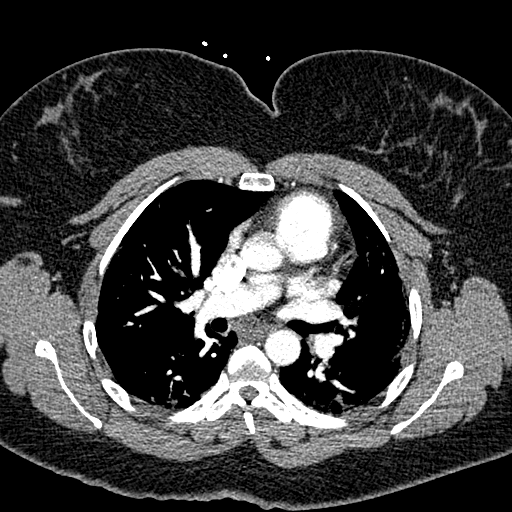
[im 115/229  lung]
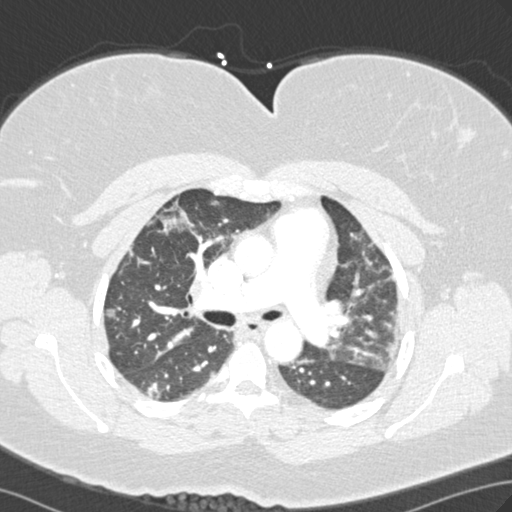
[im 127/229  mediastinal]
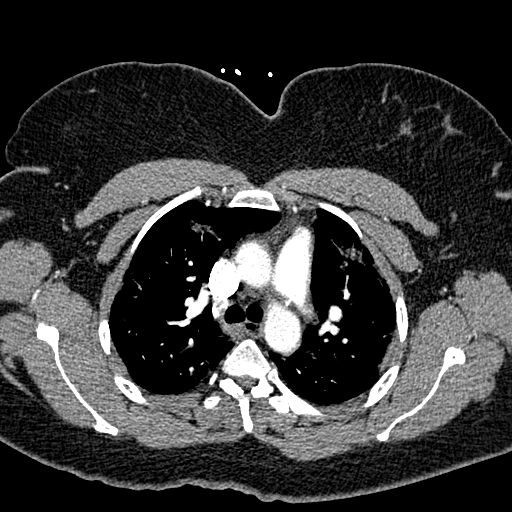
[im 140/229  lung]
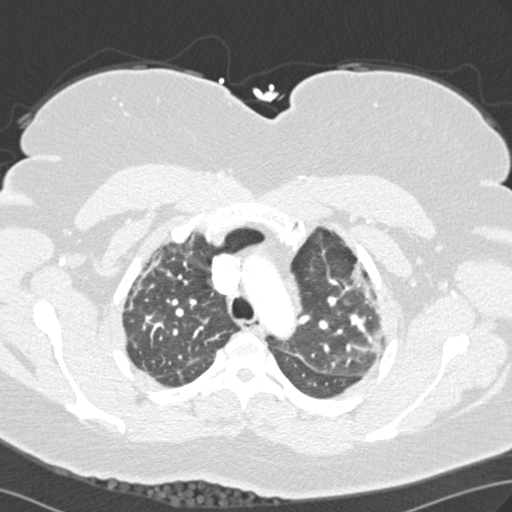
[im 153/229  mediastinal]
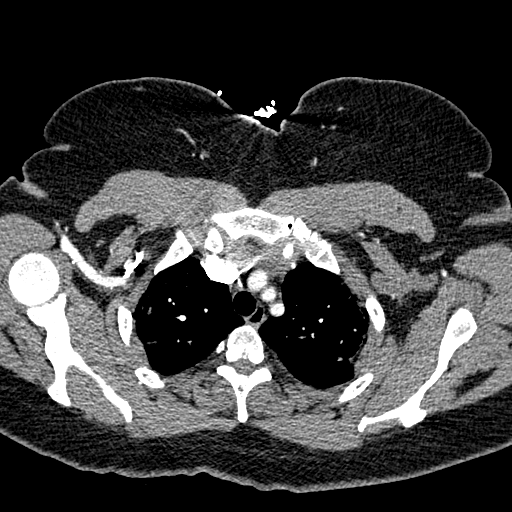
[im 165/229  lung]
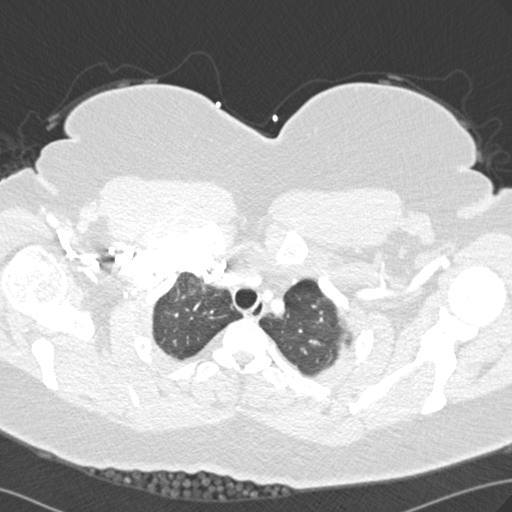
[im 178/229  mediastinal]
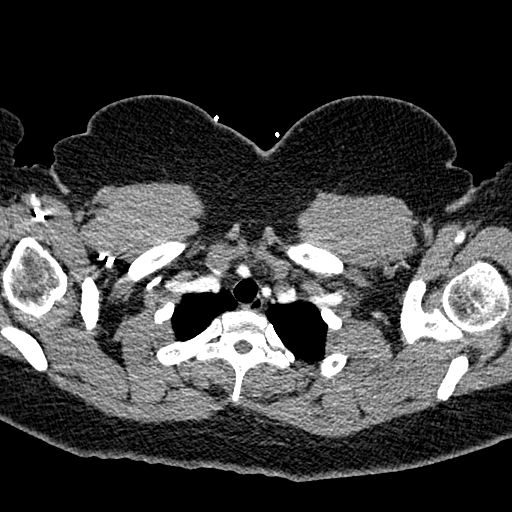
[im 191/229  lung]
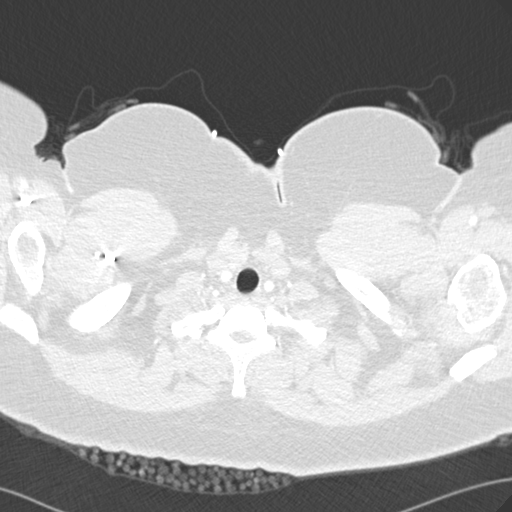
[im 203/229  mediastinal]
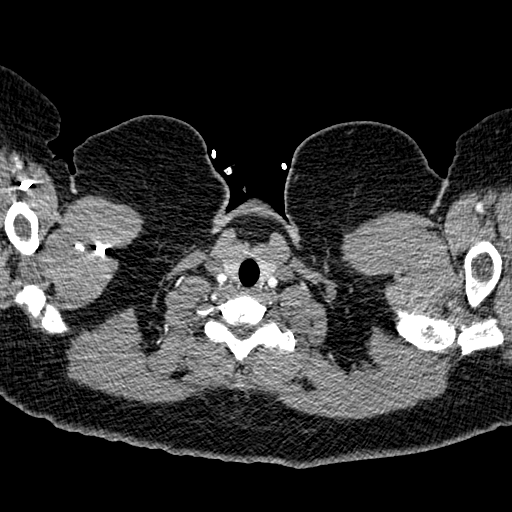
[im 216/229  lung]
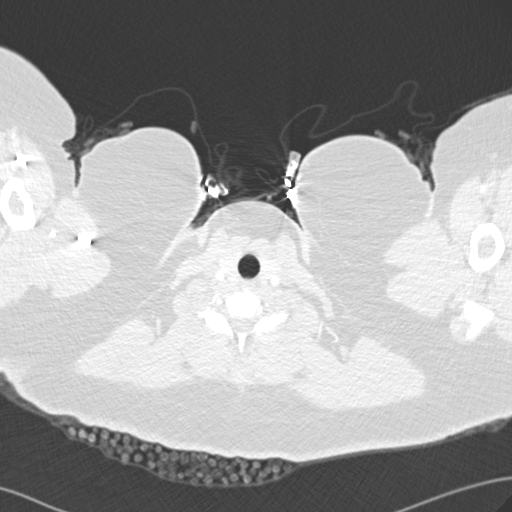

[Series 7: cor pe 2.0 mpr · coronal · 0.44mm/px · 1 of 134 slices shown]
[im 67/134  mediastinal]
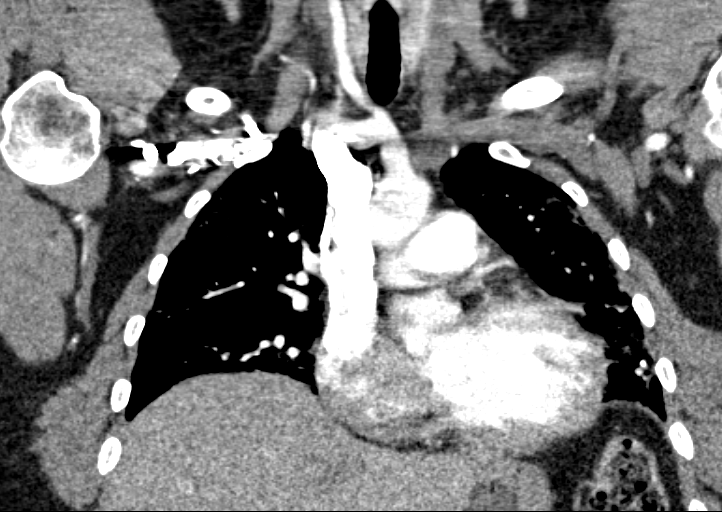

[18 of 36 positions shown; findings below may reference images not displayed]

FINDINGS: The pulmonary arteries are well opacified. There are no acute
pulmonary emboli. The recent pulmonary emboli have completely
resolved. The thoracic aorta is normal in caliber and intact.

Scattered subpleural linear opacities are present in both lungs,
more prominent in the bases where there is mild confluent opacity.
No significant change in the scattered opacities compare to the
prior study. Central airways are patent. There are no pleural
effusions. No pericardial effusion. No hilar or mediastinal
adenopathy.

There is no significant abnormality in the upper abdomen. No
significant musculoskeletal abnormality is evident.

Review of the MIP images confirms the above findings.
IMPRESSION: Negative for pulmonary embolism. Unchanged interstitial and alveolar
opacities are present in both lungs.

## 2017-03-08 ENCOUNTER — Telehealth: Payer: Self-pay | Admitting: Emergency Medicine

## 2017-03-08 NOTE — Telephone Encounter (Signed)
Called patient due to lwot to inquire about condition and follow up plans. Says she continues to have same symptoms.  I advised she return here or seek an exam elsewhere, but to tell them wherever she goes that she has completed lab testss in epic.

## 2017-04-16 DIAGNOSIS — I10 Essential (primary) hypertension: Secondary | ICD-10-CM | POA: Insufficient documentation

## 2017-04-19 DIAGNOSIS — R7303 Prediabetes: Secondary | ICD-10-CM | POA: Insufficient documentation

## 2017-05-12 ENCOUNTER — Other Ambulatory Visit: Payer: Self-pay | Admitting: Specialist

## 2017-05-12 DIAGNOSIS — J8489 Other specified interstitial pulmonary diseases: Secondary | ICD-10-CM

## 2017-05-19 ENCOUNTER — Ambulatory Visit
Admission: RE | Admit: 2017-05-19 | Discharge: 2017-05-19 | Disposition: A | Discharge status: Home / Self Care | Payer: Medicaid Other | Source: Ambulatory Visit | Attending: Specialist | Admitting: Specialist

## 2017-05-19 DIAGNOSIS — R932 Abnormal findings on diagnostic imaging of liver and biliary tract: Secondary | ICD-10-CM | POA: Diagnosis not present

## 2017-05-19 DIAGNOSIS — J8489 Other specified interstitial pulmonary diseases: Secondary | ICD-10-CM | POA: Insufficient documentation

## 2017-06-08 ENCOUNTER — Ambulatory Visit: Payer: Self-pay | Admitting: Dietician

## 2017-06-08 ENCOUNTER — Encounter: Payer: Self-pay | Admitting: Dietician

## 2017-08-10 ENCOUNTER — Encounter: Payer: Medicaid Other | Attending: Specialist | Admitting: *Deleted

## 2017-08-10 ENCOUNTER — Encounter: Payer: Self-pay | Admitting: *Deleted

## 2017-08-10 VITALS — Ht 68.0 in | Wt 268.8 lb

## 2017-08-10 DIAGNOSIS — J45909 Unspecified asthma, uncomplicated: Secondary | ICD-10-CM | POA: Diagnosis not present

## 2017-08-10 DIAGNOSIS — F329 Major depressive disorder, single episode, unspecified: Secondary | ICD-10-CM | POA: Insufficient documentation

## 2017-08-10 DIAGNOSIS — F32A Depression, unspecified: Secondary | ICD-10-CM | POA: Insufficient documentation

## 2017-08-10 NOTE — Progress Notes (Signed)
Pulmonary Individual Treatment Plan  Patient Details  Name: Lisa [name] MRN: [id number] Date of Birth: 06-Mar-1971 Referring Provider:     Pulmonary Rehab from 08/10/2017 in [facility] Cardiac and Pulmonary Rehab  Referring Provider  Raul Del      Initial Encounter Date:    Pulmonary Rehab from 08/10/2017 in [facility] Cardiac and Pulmonary Rehab  Date  08/10/17  Referring Provider  Raul Del      Visit Diagnosis: Uncomplicated asthma, unspecified asthma severity, unspecified whether persistent  Patient's Home Medications on Admission:  Current Outpatient Medications:  .  albuterol (PROVENTIL HFA;VENTOLIN HFA) 108 (90 Base) MCG/ACT inhaler, Inhale 2 puffs into the lungs every 6 (six) hours as needed for wheezing or shortness of breath., Disp: 1 Inhaler, Rfl: 2 .  azaTHIOprine (IMURAN) 50 MG tablet, Take by mouth., Disp: , Rfl:  .  gabapentin (NEURONTIN) 100 MG capsule, Take by mouth., Disp: , Rfl:  .  Ipratropium-Albuterol (COMBIVENT) 20-100 MCG/ACT AERS respimat, Inhale 1 puff into the lungs every 6 (six) hours., Disp: , Rfl:  .  montelukast (SINGULAIR) 10 MG tablet, Take by mouth., Disp: , Rfl:  .  omeprazole (PRILOSEC) 40 MG capsule, Take by mouth., Disp: , Rfl:  .  phentermine 37.5 MG capsule, Take by mouth., Disp: , Rfl:  .  predniSONE (DELTASONE) 10 MG tablet, Take 20 mg by mouth daily., Disp: , Rfl:  .  ferrous sulfate 325 (65 FE) MG tablet, Take 325 mg by mouth daily with breakfast., Disp: , Rfl:   Past Medical History: Past Medical History:  Diagnosis Date  . Abdominal pain   . Anemia    hx of anemic  . Asthma   . Bipolar 1 disorder ([place])   . Blood in stool   . Change in voice   . Chest pain   . Constipation   . Depression   . Diverticulitis   . DVT (deep venous thrombosis) ([place]) 12/13/2014  . Dysrhythmia   . Fatigue   . Gallstones 04/10/2011   with biliary pancreatitis  . GERD (gastroesophageal reflux disease)   . Migraines   . MRSA carrier   . Nausea   .  Pancreatitis   . PTSD (post-traumatic stress disorder)   . Pulmonary embolism ([place]) 12/13/2014  . Sleep apnea    cpap  . Trouble swallowing     Tobacco Use: Social History   Tobacco Use  Smoking Status Never Smoker  Smokeless Tobacco Never Used    Labs: Recent Review Scientist, physiological    Labs for ITP Cardiac and Pulmonary Rehab Latest Ref Rng & Units 03/18/2014   Hemoglobin A1c 4.2 - 6.3 % 5.6       Pulmonary Assessment Scores: Pulmonary Assessment Scores    Row Name 08/10/17 1257         ADL UCSD   ADL Phase  Entry     SOB Score total  98     Rest  2     Walk  4     Stairs  5     Bath  4     Dress  3     Shop  4       CAT Score   CAT Score  39       mMRC Score   mMRC Score  2        Pulmonary Function Assessment: Pulmonary Function Assessment - 08/10/17 1256      Breath   Shortness of Breath  Fear of Shortness of Breath;Yes;Limiting activity  Exercise Target Goals: Date: 08/10/17  Exercise Program Goal: Individual exercise prescription set using results from initial 6 min walk test and THRR while considering  patient's activity barriers and safety.    Exercise Prescription Goal: Initial exercise prescription builds to 30-45 minutes a day of aerobic activity, 2-3 days per week.  Home exercise guidelines will be given to patient during program as part of exercise prescription that the participant will acknowledge.  Activity Barriers & Risk Stratification: Activity Barriers & Cardiac Risk Stratification - 08/10/17 1247      Activity Barriers & Cardiac Risk Stratification   Activity Barriers  Back Problems;Shortness of Breath       6 Minute Walk: 6 Minute Walk    Row Name 08/10/17 1318         6 Minute Walk   Phase  Initial     Distance  1250 feet     Walk Time  6 minutes     # of Rest Breaks  0     MPH  2.37     METS  4.13     RPE  19     Perceived Dyspnea   4     VO2 Peak  14.47     Symptoms  Yes (comment)     Comments  back pain  7/10     Resting HR  77 bpm     Resting BP  126/76     Resting Oxygen Saturation   98 %     Exercise Oxygen Saturation  during 6 min walk  91 %     Max Ex. HR  138 bpm     Max Ex. BP  162/86     2 Minute Post BP  138/84       Interval HR   1 Minute HR  112     2 Minute HR  133     3 Minute HR  136     4 Minute HR  134     5 Minute HR  138     6 Minute HR  115     2 Minute Post HR  91     Interval Heart Rate?  Yes       Interval Oxygen   Interval Oxygen?  Yes     Baseline Oxygen Saturation %  98 %     1 Minute Liters of Oxygen  0 L     2 Minute Oxygen Saturation %  91 %     2 Minute Liters of Oxygen  0 L     3 Minute Oxygen Saturation %  94 %     3 Minute Liters of Oxygen  0 L     4 Minute Oxygen Saturation %  95 %     4 Minute Liters of Oxygen  0 L     5 Minute Oxygen Saturation %  94 %     5 Minute Liters of Oxygen  0 L     6 Minute Oxygen Saturation %  95 %     6 Minute Liters of Oxygen  0 L     2 Minute Post Oxygen Saturation %  100 %     2 Minute Post Liters of Oxygen  0 L       Oxygen Initial Assessment: Oxygen Initial Assessment - 08/10/17 1238      Home Oxygen   Home Oxygen Device  Home Concentrator    Sleep Oxygen Prescription  Continuous;CPAP  Liters per minute  2    Home Exercise Oxygen Prescription  None    Home at Rest Exercise Oxygen Prescription  None    Compliance with Home Oxygen Use  Yes      Initial 6 min Walk   Oxygen Used  None      Program Oxygen Prescription   Program Oxygen Prescription  None      Intervention   Short Term Goals  To learn and exhibit compliance with exercise, home and travel O2 prescription;To learn and understand importance of maintaining oxygen saturations>88%;To learn and demonstrate proper use of respiratory medications;To learn and understand importance of monitoring SPO2 with pulse oximeter and demonstrate accurate use of the pulse oximeter.;To learn and demonstrate proper pursed lip breathing techniques or other  breathing techniques.    Long  Term Goals  Exhibits compliance with exercise, home and travel O2 prescription;Verbalizes importance of monitoring SPO2 with pulse oximeter and return demonstration;Maintenance of O2 saturations>88%;Exhibits proper breathing techniques, such as pursed lip breathing or other method taught during program session;Compliance with respiratory medication;Demonstrates proper use of MDI's       Oxygen Re-Evaluation:   Oxygen Discharge (Final Oxygen Re-Evaluation):   Initial Exercise Prescription: Initial Exercise Prescription - 08/10/17 1300      Date of Initial Exercise RX and Referring Provider   Date  08/10/17    Referring Provider  Raul Del      Treadmill   MPH  2.3    Grade  0.5    Minutes  15    METs  2.92      NuStep   Level  2    SPM  80    Minutes  15    METs  3      REL-XR   Level  2    Speed  50    Minutes  15    METs  3      Biostep-RELP   Level  2    SPM  50    Minutes  15    METs  3      Prescription Details   Frequency (times per week)  3    Duration  Progress to 45 minutes of aerobic exercise without signs/symptoms of physical distress      Intensity   THRR 40-80% of Max Heartrate  116-156    Ratings of Perceived Exertion  11-13    Perceived Dyspnea  0-4      Resistance Training   Training Prescription  Yes    Weight  3 lb    Reps  10-15       Perform Capillary Blood Glucose checks as needed.  Exercise Prescription Changes:   Exercise Comments:   Exercise Goals and Review: Exercise Goals    Row Name 08/10/17 1314             Exercise Goals   Increase Physical Activity  Yes       Intervention  Provide advice, education, support and counseling about physical activity/exercise needs.;Develop an individualized exercise prescription for aerobic and resistive training based on initial evaluation findings, risk stratification, comorbidities and participant's personal goals.       Expected Outcomes  Short  Term: Attend rehab on a regular basis to increase amount of physical activity.;Long Term: Exercising regularly at least 3-5 days a week.       Increase Strength and Stamina  Yes       Intervention  Provide advice, education, support and counseling about  physical activity/exercise needs.;Develop an individualized exercise prescription for aerobic and resistive training based on initial evaluation findings, risk stratification, comorbidities and participant's personal goals.       Expected Outcomes  Short Term: Increase workloads from initial exercise prescription for resistance, speed, and METs.;Long Term: Improve cardiorespiratory fitness, muscular endurance and strength as measured by increased METs and functional capacity (6MWT);Short Term: Perform resistance training exercises routinely during rehab and add in resistance training at home       Able to understand and use rate of perceived exertion (RPE) scale  Yes       Intervention  Provide education and explanation on how to use RPE scale       Expected Outcomes  Short Term: Able to use RPE daily in rehab to express subjective intensity level;Long Term:  Able to use RPE to guide intensity level when exercising independently       Able to understand and use Dyspnea scale  Yes       Intervention  Provide education and explanation on how to use Dyspnea scale       Expected Outcomes  Short Term: Able to use Dyspnea scale daily in rehab to express subjective sense of shortness of breath during exertion;Long Term: Able to use Dyspnea scale to guide intensity level when exercising independently       Knowledge and understanding of Target Heart Rate Range (THRR)  Yes       Intervention  Provide education and explanation of THRR including how the numbers were predicted and where they are located for reference       Expected Outcomes  Short Term: Able to state/look up THRR;Short Term: Able to use daily as guideline for intensity in rehab;Long Term: Able to use  THRR to govern intensity when exercising independently       Able to check pulse independently  Yes       Intervention  Provide education and demonstration on how to check pulse in carotid and radial arteries.;Review the importance of being able to check your own pulse for safety during independent exercise       Expected Outcomes  Short Term: Able to explain why pulse checking is important during independent exercise;Long Term: Able to check pulse independently and accurately       Understanding of Exercise Prescription  Yes       Intervention  Provide education, explanation, and written materials on patient's individual exercise prescription       Expected Outcomes  Short Term: Able to explain program exercise prescription;Long Term: Able to explain home exercise prescription to exercise independently          Exercise Goals Re-Evaluation :   Discharge Exercise Prescription (Final Exercise Prescription Changes):   Nutrition:  Target Goals: Understanding of nutrition guidelines, daily intake of sodium <1512m, cholesterol <2090m calories 30% from fat and 7% or less from saturated fats, daily to have 5 or more servings of fruits and vegetables.  Biometrics: Pre Biometrics - 08/10/17 1313      Pre Biometrics   Height  '5\' 8"'  (1.727 m)    Weight  268 lb 12.8 oz (121.9 kg)    Waist Circumference  42 inches    Hip Circumference  44.5 inches    Waist to Hip Ratio  0.94 %    BMI (Calculated)  40.88        Nutrition Therapy Plan and Nutrition Goals: Nutrition Therapy & Goals - 08/10/17 1247      Intervention  Plan   Intervention  Prescribe, educate and counsel regarding individualized specific dietary modifications aiming towards targeted core components such as weight, hypertension, lipid management, diabetes, heart failure and other comorbidities.    Expected Outcomes  Short Term Goal: Understand basic principles of dietary content, such as calories, fat, sodium, cholesterol and  nutrients.;Short Term Goal: A plan has been developed with personal nutrition goals set during dietitian appointment.;Long Term Goal: Adherence to prescribed nutrition plan.       Nutrition Assessments: Nutrition Assessments - 08/10/17 1247      MEDFICTS Scores   Pre Score  46       Nutrition Goals Re-Evaluation:   Nutrition Goals Discharge (Final Nutrition Goals Re-Evaluation):   Psychosocial: Target Goals: Acknowledge presence or absence of significant depression and/or stress, maximize coping skills, provide positive support system. Participant is able to verbalize types and ability to use techniques and skills needed for reducing stress and depression.   Initial Review & Psychosocial Screening: Initial Psych Review & Screening - 08/10/17 1249      Initial Review   Current issues with  Current Depression;History of Depression;Current Sleep Concerns;Current Stress Concerns    Source of Stress Concerns  Chronic Illness;Family;Transportation;Financial;Unable to perform yard/household activities;Unable to participate in former interests or hobbies    Comments  Currently sleeping only 2-4 hours. Waking up from coughing and not able to immediately go back to sleep. Applied for disability and was denied, now working with a Chief Executive Officer to reapply for disability benefits. Not able to manage any ADL's without needing to stop frequently because so short of breath      Family Dynamics   Good Support System?  Yes      Barriers   Psychosocial barriers to participate in program  There are no identifiable barriers or psychosocial needs.;The patient should benefit from training in stress management and relaxation.      Screening Interventions   Interventions  Encouraged to exercise;Provide feedback about the scores to participant;Program counselor consult;To provide support and resources with identified psychosocial needs    Expected Outcomes  Short Term goal: Utilizing psychosocial counselor,  staff and physician to assist with identification of specific Stressors or current issues interfering with healing process. Setting desired goal for each stressor or current issue identified.;Long Term Goal: Stressors or current issues are controlled or eliminated.;Short Term goal: Identification and review with participant of any Quality of Life or Depression concerns found by scoring the questionnaire.;Long Term goal: The participant improves quality of Life and PHQ9 Scores as seen by post scores and/or verbalization of changes       Quality of Life Scores:  Scores of 19 and below usually indicate a poorer quality of life in these areas.  A difference of  2-3 points is a clinically meaningful difference.  A difference of 2-3 points in the total score of the Quality of Life Index has been associated with significant improvement in overall quality of life, self-image, physical symptoms, and general health in studies assessing change in quality of life.  PHQ-9: Recent Review Flowsheet Data    Depression screen [facility] 2/9 08/10/2017   Decreased Interest 3   Down, Depressed, Hopeless 2   PHQ - 2 Score 5   Altered sleeping 3   Tired, decreased energy 3   Change in appetite 3   Feeling bad or failure about yourself  2   Trouble concentrating 3   Moving slowly or fidgety/restless 3   Suicidal thoughts 0   PHQ-9 Score 22  Difficult doing work/chores Extremely dIfficult     Interpretation of Total Score  Total Score Depression Severity:  1-4 = Minimal depression, 5-9 = Mild depression, 10-14 = Moderate depression, 15-19 = Moderately severe depression, 20-27 = Severe depression   Psychosocial Evaluation and Intervention:   Psychosocial Re-Evaluation:   Psychosocial Discharge (Final Psychosocial Re-Evaluation):   Education: Education Goals: Education classes will be provided on a weekly basis, covering required topics. Participant will state understanding/return demonstration of topics  presented.  Learning Barriers/Preferences: Learning Barriers/Preferences - 08/10/17 1254      Learning Barriers/Preferences   Learning Barriers  Reading In past year it takes longer to comprehend when reading.    Learning Preferences  Individual Instruction       Education Topics:  Initial Evaluation Education: - Verbal, written and demonstration of respiratory meds, oximetry and breathing techniques. Instruction on use of nebulizers and MDIs and importance of monitoring MDI activations.   Pulmonary Rehab from 08/10/2017 in [facility] Cardiac and Pulmonary Rehab  Date  08/10/17  Educator  SB  Instruction Review Code  1- Verbalizes Understanding      General Nutrition Guidelines/Fats and Fiber: -Group instruction provided by verbal, written material, models and posters to present the general guidelines for heart healthy nutrition. Gives an explanation and review of dietary fats and fiber.   Controlling Sodium/Reading Food Labels: -Group verbal and written material supporting the discussion of sodium use in heart healthy nutrition. Review and explanation with models, verbal and written materials for utilization of the food label.   Exercise Physiology & General Exercise Guidelines: - Group verbal and written instruction with models to review the exercise physiology of the cardiovascular system and associated critical values. Provides general exercise guidelines with specific guidelines to those with heart or lung disease.    Aerobic Exercise & Resistance Training: - Gives group verbal and written instruction on the various components of exercise. Focuses on aerobic and resistive training programs and the benefits of this training and how to safely progress through these programs.   Flexibility, Balance, Mind/Body Relaxation: Provides group verbal/written instruction on the benefits of flexibility and balance training, including mind/body exercise modes such as yoga, pilates and tai chi.   Demonstration and skill practice provided.   Stress and Anxiety: - Provides group verbal and written instruction about the health risks of elevated stress and causes of high stress.  Discuss the correlation between heart/lung disease and anxiety and treatment options. Review healthy ways to manage with stress and anxiety.   Depression: - Provides group verbal and written instruction on the correlation between heart/lung disease and depressed mood, treatment options, and the stigmas associated with seeking treatment.   Exercise & Equipment Safety: - Individual verbal instruction and demonstration of equipment use and safety with use of the equipment.   Pulmonary Rehab from 08/10/2017 in [facility] Cardiac and Pulmonary Rehab  Date  08/10/17  Educator  Sb  Instruction Review Code  1- Verbalizes Understanding      Infection Prevention: - Provides verbal and written material to individual with discussion of infection control including proper hand washing and proper equipment cleaning during exercise session.   Pulmonary Rehab from 08/10/2017 in [facility] Cardiac and Pulmonary Rehab  Date  08/10/17  Educator  SB  Instruction Review Code  1- Verbalizes Understanding      Falls Prevention: - Provides verbal and written material to individual with discussion of falls prevention and safety.   Diabetes: - Individual verbal and written instruction to review signs/symptoms of  diabetes, desired ranges of glucose level fasting, after meals and with exercise. Advice that pre and post exercise glucose checks will be done for 3 sessions at entry of program.   Chronic Lung Diseases: - Group verbal and written instruction to review updates, respiratory medications, advancements in procedures and treatments. Discuss use of supplemental oxygen including available portable oxygen systems, continuous and intermittent flow rates, concentrators, personal use and safety guidelines. Review proper use of inhaler and  spacers. Provide informative websites for self-education.    Energy Conservation: - Provide group verbal and written instruction for methods to conserve energy, plan and organize activities. Instruct on pacing techniques, use of adaptive equipment and posture/positioning to relieve shortness of breath.   Triggers and Exacerbations: - Group verbal and written instruction to review types of environmental triggers and ways to prevent exacerbations. Discuss weather changes, air quality and the benefits of nasal washing. Review warning signs and symptoms to help prevent infections. Discuss techniques for effective airway clearance, coughing, and vibrations.   AED/CPR: - Group verbal and written instruction with the use of models to demonstrate the basic use of the AED with the basic ABC's of resuscitation.   Anatomy and Physiology of the Lungs: - Group verbal and written instruction with the use of models to provide basic lung anatomy and physiology related to function, structure and complications of lung disease.   Anatomy & Physiology of the Heart: - Group verbal and written instruction and models provide basic cardiac anatomy and physiology, with the coronary electrical and arterial systems. Review of Valvular disease and Heart Failure   Cardiac Medications: - Group verbal and written instruction to review commonly prescribed medications for heart disease. Reviews the medication, class of the drug, and side effects.   Know Your Numbers and Risk Factors: -Group verbal and written instruction about important numbers in your health.  Discussion of what are risk factors and how they play a role in the disease process.  Review of Cholesterol, Blood Pressure, Diabetes, and BMI and the role they play in your overall health.   Sleep Hygiene: -Provides group verbal and written instruction about how sleep can affect your health.  Define sleep hygiene, discuss sleep cycles and impact of sleep  habits. Review good sleep hygiene tips.    Other: -Provides group and verbal instruction on various topics (see comments)    Knowledge Questionnaire Score: Knowledge Questionnaire Score - 08/10/17 1255      Knowledge Questionnaire Score   Pre Score  15/18 Reviewed correct responses with Robinette and she verbalized understanding of the responses.  She had no further questions today.        Core Components/Risk Factors/Patient Goals at Admission: Personal Goals and Risk Factors at Admission - 08/10/17 1248      Core Components/Risk Factors/Patient Goals on Admission    Weight Management  Yes;Obesity    Intervention  Weight Management: Develop a combined nutrition and exercise program designed to reach desired caloric intake, while maintaining appropriate intake of nutrient and fiber, sodium and fats, and appropriate energy expenditure required for the weight goal.;Weight Management/Obesity: Establish reasonable short term and long term weight goals.;Obesity: Provide education and appropriate resources to help participant work on and attain dietary goals.    Admit Weight  268 lb 12.8 oz (121.9 kg)    Goal Weight: Short Term  265 lb (120.2 kg)    Goal Weight: Long Term  180 lb (81.6 kg)    Expected Outcomes  Short Term: Continue to assess and  modify interventions until short term weight is achieved;Long Term: Adherence to nutrition and physical activity/exercise program aimed toward attainment of established weight goal;Weight Loss: Understanding of general recommendations for a balanced deficit meal plan, which promotes 1-2 lb weight loss per week and includes a negative energy balance of [phone] kcal/d    Improve shortness of breath with ADL's  Yes    Intervention  Provide education, individualized exercise plan and daily activity instruction to help decrease symptoms of SOB with activities of daily living.    Expected Outcomes  Short Term: Improve cardiorespiratory fitness to achieve a  reduction of symptoms when performing ADLs;Long Term: Be able to perform more ADLs without symptoms or delay the onset of symptoms    Hypertension  Yes    Intervention  Provide education on lifestyle modifcations including regular physical activity/exercise, weight management, moderate sodium restriction and increased consumption of fresh fruit, vegetables, and low fat dairy, alcohol moderation, and smoking cessation.;Monitor prescription use compliance.    Expected Outcomes  Short Term: Continued assessment and intervention until BP is < 140/53m HG in hypertensive participants. < 130/859mHG in hypertensive participants with diabetes, heart failure or chronic kidney disease.;Long Term: Maintenance of blood pressure at goal levels.    Stress  Yes    Intervention  Offer individual and/or small group education and counseling on adjustment to heart disease, stress management and health-related lifestyle change. Teach and support self-help strategies.;Refer participants experiencing significant psychosocial distress to appropriate mental health specialists for further evaluation and treatment. When possible, include family members and significant others in education/counseling sessions.    Expected Outcomes  Short Term: Participant demonstrates changes in health-related behavior, relaxation and other stress management skills, ability to obtain effective social support, and compliance with psychotropic medications if prescribed.;Long Term: Emotional wellbeing is indicated by absence of clinically significant psychosocial distress or social isolation.       Core Components/Risk Factors/Patient Goals Review:    Core Components/Risk Factors/Patient Goals at Discharge (Final Review):    ITP Comments: ITP Comments    Row Name 08/10/17 1235 08/10/17 1301         ITP Comments  Medical review completed today   ITP sent to Dr M Loleta Chanceor review, changes as needed and signature.  [facility]  RoNasirahs working on weight loss to be eligible to talk to the lung transplant team.          Comments: Initial ITP

## 2017-08-10 NOTE — Patient Instructions (Signed)
Patient Instructions  Patient Details  Name: Lisa [name] MRN: [id number] Date of Birth: 1970/09/20 Referring Provider:  Erby Pian, MD  Below are your personal goals for exercise, nutrition, and risk factors. Our goal is to help you stay on track towards obtaining and maintaining these goals. We will be discussing your progress on these goals with you throughout the program.  Initial Exercise Prescription: Initial Exercise Prescription - 08/10/17 1300      Date of Initial Exercise RX and Referring Provider   Date  08/10/17    Referring Provider  Raul Del      Treadmill   MPH  2.3    Grade  0.5    Minutes  15    METs  2.92      NuStep   Level  2    SPM  80    Minutes  15    METs  3      REL-XR   Level  2    Speed  50    Minutes  15    METs  3      Biostep-RELP   Level  2    SPM  50    Minutes  15    METs  3      Prescription Details   Frequency (times per week)  3    Duration  Progress to 45 minutes of aerobic exercise without signs/symptoms of physical distress      Intensity   THRR 40-80% of Max Heartrate  116-156    Ratings of Perceived Exertion  11-13    Perceived Dyspnea  0-4      Resistance Training   Training Prescription  Yes    Weight  3 lb    Reps  10-15       Exercise Goals: Frequency: Be able to perform aerobic exercise two to three times per week in program working toward 2-5 days per week of home exercise.  Intensity: Work with a perceived exertion of 11 (fairly light) - 15 (hard) while following your exercise prescription.  We will make changes to your prescription with you as you progress through the program.   Duration: Be able to do 30 to 45 minutes of continuous aerobic exercise in addition to a 5 minute warm-up and a 5 minute cool-down routine.   Nutrition Goals: Your personal nutrition goals will be established when you do your nutrition analysis with the dietician.  The following are general nutrition guidelines to  follow: Cholesterol < 200mg /day Sodium < 1500mg /day Fiber: Women under 50 yrs - 25 grams per day  Personal Goals: Personal Goals and Risk Factors at Admission - 08/10/17 1248      Core Components/Risk Factors/Patient Goals on Admission    Weight Management  Yes;Obesity    Intervention  Weight Management: Develop a combined nutrition and exercise program designed to reach desired caloric intake, while maintaining appropriate intake of nutrient and fiber, sodium and fats, and appropriate energy expenditure required for the weight goal.;Weight Management/Obesity: Establish reasonable short term and long term weight goals.;Obesity: Provide education and appropriate resources to help participant work on and attain dietary goals.    Admit Weight  268 lb 12.8 oz (121.9 kg)    Goal Weight: Short Term  265 lb (120.2 kg)    Goal Weight: Long Term  180 lb (81.6 kg)    Expected Outcomes  Short Term: Continue to assess and modify interventions until short term weight is achieved;Long Term: Adherence to nutrition  and physical activity/exercise program aimed toward attainment of established weight goal;Weight Loss: Understanding of general recommendations for a balanced deficit meal plan, which promotes 1-2 lb weight loss per week and includes a negative energy balance of [phone] kcal/d    Improve shortness of breath with ADL's  Yes    Intervention  Provide education, individualized exercise plan and daily activity instruction to help decrease symptoms of SOB with activities of daily living.    Expected Outcomes  Short Term: Improve cardiorespiratory fitness to achieve a reduction of symptoms when performing ADLs;Long Term: Be able to perform more ADLs without symptoms or delay the onset of symptoms    Hypertension  Yes    Intervention  Provide education on lifestyle modifcations including regular physical activity/exercise, weight management, moderate sodium restriction and increased consumption of fresh  fruit, vegetables, and low fat dairy, alcohol moderation, and smoking cessation.;Monitor prescription use compliance.    Expected Outcomes  Short Term: Continued assessment and intervention until BP is < 140/60mm HG in hypertensive participants. < 130/62mm HG in hypertensive participants with diabetes, heart failure or chronic kidney disease.;Long Term: Maintenance of blood pressure at goal levels.    Stress  Yes    Intervention  Offer individual and/or small group education and counseling on adjustment to heart disease, stress management and health-related lifestyle change. Teach and support self-help strategies.;Refer participants experiencing significant psychosocial distress to appropriate mental health specialists for further evaluation and treatment. When possible, include family members and significant others in education/counseling sessions.    Expected Outcomes  Short Term: Participant demonstrates changes in health-related behavior, relaxation and other stress management skills, ability to obtain effective social support, and compliance with psychotropic medications if prescribed.;Long Term: Emotional wellbeing is indicated by absence of clinically significant psychosocial distress or social isolation.       Tobacco Use Initial Evaluation: Social History   Tobacco Use  Smoking Status Never Smoker  Smokeless Tobacco Never Used    Exercise Goals and Review: Exercise Goals    Row Name 08/10/17 1314             Exercise Goals   Increase Physical Activity  Yes       Intervention  Provide advice, education, support and counseling about physical activity/exercise needs.;Develop an individualized exercise prescription for aerobic and resistive training based on initial evaluation findings, risk stratification, comorbidities and participant's personal goals.       Expected Outcomes  Short Term: Attend rehab on a regular basis to increase amount of physical activity.;Long Term: Exercising  regularly at least 3-5 days a week.       Increase Strength and Stamina  Yes       Intervention  Provide advice, education, support and counseling about physical activity/exercise needs.;Develop an individualized exercise prescription for aerobic and resistive training based on initial evaluation findings, risk stratification, comorbidities and participant's personal goals.       Expected Outcomes  Short Term: Increase workloads from initial exercise prescription for resistance, speed, and METs.;Long Term: Improve cardiorespiratory fitness, muscular endurance and strength as measured by increased METs and functional capacity (6MWT);Short Term: Perform resistance training exercises routinely during rehab and add in resistance training at home       Able to understand and use rate of perceived exertion (RPE) scale  Yes       Intervention  Provide education and explanation on how to use RPE scale       Expected Outcomes  Short Term: Able to use RPE daily  in rehab to express subjective intensity level;Long Term:  Able to use RPE to guide intensity level when exercising independently       Able to understand and use Dyspnea scale  Yes       Intervention  Provide education and explanation on how to use Dyspnea scale       Expected Outcomes  Short Term: Able to use Dyspnea scale daily in rehab to express subjective sense of shortness of breath during exertion;Long Term: Able to use Dyspnea scale to guide intensity level when exercising independently       Knowledge and understanding of Target Heart Rate Range (THRR)  Yes       Intervention  Provide education and explanation of THRR including how the numbers were predicted and where they are located for reference       Expected Outcomes  Short Term: Able to state/look up THRR;Short Term: Able to use daily as guideline for intensity in rehab;Long Term: Able to use THRR to govern intensity when exercising independently       Able to check pulse independently  Yes        Intervention  Provide education and demonstration on how to check pulse in carotid and radial arteries.;Review the importance of being able to check your own pulse for safety during independent exercise       Expected Outcomes  Short Term: Able to explain why pulse checking is important during independent exercise;Long Term: Able to check pulse independently and accurately       Understanding of Exercise Prescription  Yes       Intervention  Provide education, explanation, and written materials on patient's individual exercise prescription       Expected Outcomes  Short Term: Able to explain program exercise prescription;Long Term: Able to explain home exercise prescription to exercise independently          Copy of goals given to participant.

## 2017-08-16 ENCOUNTER — Encounter: Payer: Medicaid Other | Attending: Specialist

## 2017-08-16 DIAGNOSIS — J45909 Unspecified asthma, uncomplicated: Secondary | ICD-10-CM | POA: Diagnosis not present

## 2017-08-16 NOTE — Progress Notes (Signed)
Daily Session Note  Patient Details  Name: Lisa [name] MRN: [id number] Date of Birth: 1971/02/12 Referring Provider:     Pulmonary Rehab from 08/10/2017 in [facility] Card[facility] and Pulmonary Rehab  Referring Provider  Raul Del      Encounter Date: 08/16/2017  Check In: Session Check In - 08/16/17 1022      Check-In   Location  [facility]-Cardiac & Pulmonary Rehab    Staff Present  Nada Maclachlan, BA, ACSM CEP, Exercise Physiologist;Kelly Amedeo Plenty, BS, ACSM CEP, Exercise Physiologist;Ibrahim Mcpheeters Flavia Shipper    Supervising physician immediately available to respond to emergencies  [facility] immediately available ER MD    Physician(s)  Dr. Quentin Cornwall and Jimmye Norman    Medication changes reported      No    Fall or balance concerns reported     No    Warm-up and Cool-down  Performed as group-led instruction    Resistance Training Performed  Yes    VAD Patient?  No      Pain Assessment   Currently in Pain?  No/denies          Social History   Tobacco Use  Smoking Status Never Smoker  Smokeless Tobacco Never Used    Goals Met:  Exercise tolerated well Personal goals reviewed Queuing for purse lip breathing No report of cardiac concerns or symptoms Strength training completed today  Goals Unmet:  Not Applicable  Comments: First full day of exercise!  Patient was oriented to gym and equipment including functions, settings, policies, and procedures.  Patient's individual exercise prescription and treatment plan were reviewed.  All starting workloads were established based on the results of the 6 minute walk test done at initial orientation visit.  The plan for exercise progression was also introduced and progression will be customized based on patient's performance and goals.   Dr. Emily Filbert is Medical Director for [place] and [facility].

## 2017-08-18 DIAGNOSIS — J45909 Unspecified asthma, uncomplicated: Secondary | ICD-10-CM

## 2017-08-18 NOTE — Progress Notes (Signed)
Daily Session Note  Patient Details  Name: Lisa [name] MRN: [id number] Date of Birth: February 07, 1971 Referring Provider:     Pulmonary Rehab from 08/10/2017 in [facility] Re[facility]  Referring Provider  Raul Del      Encounter Date: 08/18/2017  Check In: Session Check In - 08/18/17 1014      Check-In   Location  [facility]-Cardiac & Pulmonary Rehab    Staff Present  Alberteen Sam, MA, RCEP, CCRP, Exercise Physiologist;Amanda Oletta Darter, [place], ACSM CEP, Exercise Physiologist;Tavian Callander Flavia Shipper    Supervising physician immediately available to respond to emergencies  [facility] immediately available ER MD    Physician(s)  Jimmye Norman and Lucita Lora    Medication changes reported      No    Fall or balance concerns reported     No    Warm-up and Cool-down  Performed as group-led Higher education careers adviser Performed  Yes    VAD Patient?  No      Pain Assessment   Currently in Pain?  No/denies    Multiple Pain Sites  No          Social History   Tobacco Use  Smoking Status Never Smoker  Smokeless Tobacco Never Used    Goals Met:  Independence with exercise equipment Exercise tolerated well No report of cardiac concerns or symptoms Strength training completed today  Goals Unmet:  Not Applicable  Comments: Pt able to follow exercise prescription today without complaint.  Will continue to monitor for progression.    Dr. Emily Filbert is Medical Director for [place] and [facility].

## 2017-08-20 DIAGNOSIS — J45909 Unspecified asthma, uncomplicated: Secondary | ICD-10-CM

## 2017-08-20 NOTE — Progress Notes (Signed)
Daily Session Note  Patient Details  Name: Ceilidh Torregrossa MRN: [id number] Date of Birth: 1971/04/20 Referring Provider:     Pulmonary Rehab from 08/10/2017 in [facility] Cardiac and Pulmonary Rehab  Referring Provider  Raul Del      Encounter Date: 08/20/2017  Check In: Session Check In - 08/20/17 1013      Check-In   Location  [facility]-Cardiac & Pulmonary Rehab    Staff Present  Nada Maclachlan, BA, ACSM CEP, Exercise Physiologist;Meredith Sherryll Burger, RN BSN;Joseph Flavia Shipper    Supervising physician immediately available to respond to emergencies  [facility] immediately available ER MD    Physician(s)  Dr. Cinda Quest and Reita Cliche    Medication changes reported      No    Fall or balance concerns reported     No    Warm-up and Cool-down  Performed as group-led instruction    Resistance Training Performed  Yes    VAD Patient?  No      Pain Assessment   Currently in Pain?  No/denies          Social History   Tobacco Use  Smoking Status Never Smoker  Smokeless Tobacco Never Used    Goals Met:  Independence with exercise equipment Exercise tolerated well No report of cardiac concerns or symptoms Strength training completed today  Goals Unmet:  Not Applicable  Comments: Pt able to follow exercise prescription today without complaint.  Will continue to monitor for progression.   Dr. Emily Filbert is Medical Director for [place] and [facility].

## 2017-08-23 DIAGNOSIS — J45909 Unspecified asthma, uncomplicated: Secondary | ICD-10-CM | POA: Diagnosis not present

## 2017-08-23 NOTE — Progress Notes (Signed)
Pulmonary Individual Treatment Plan  Patient Details  Name: Lisa [name] MRN: [id number] Date of Birth: 04/02/1971 Referring Provider:     Pulmonary Rehab from 08/10/2017 in [facility] Cardiac and Pulmonary Rehab  Referring Provider  Raul Del      Initial Encounter Date:    Pulmonary Rehab from 08/10/2017 in [facility] Cardiac and Pulmonary Rehab  Date  08/10/17  Referring Provider  Raul Del      Visit Diagnosis: Uncomplicated asthma, unspecified asthma severity, unspecified whether persistent  Patient's Home Medications on Admission:  Current Outpatient Medications:  .  albuterol (PROVENTIL HFA;VENTOLIN HFA) 108 (90 Base) MCG/ACT inhaler, Inhale 2 puffs into the lungs every 6 (six) hours as needed for wheezing or shortness of breath., Disp: 1 Inhaler, Rfl: 2 .  azaTHIOprine (IMURAN) 50 MG tablet, Take by mouth., Disp: , Rfl:  .  ferrous sulfate 325 (65 FE) MG tablet, Take 325 mg by mouth daily with breakfast., Disp: , Rfl:  .  gabapentin (NEURONTIN) 100 MG capsule, Take by mouth., Disp: , Rfl:  .  Ipratropium-Albuterol (COMBIVENT) 20-100 MCG/ACT AERS respimat, Inhale 1 puff into the lungs every 6 (six) hours., Disp: , Rfl:  .  montelukast (SINGULAIR) 10 MG tablet, Take by mouth., Disp: , Rfl:  .  omeprazole (PRILOSEC) 40 MG capsule, Take by mouth., Disp: , Rfl:  .  phentermine 37.5 MG capsule, Take by mouth., Disp: , Rfl:  .  predniSONE (DELTASONE) 10 MG tablet, Take 20 mg by mouth daily., Disp: , Rfl:   Past Medical History: Past Medical History:  Diagnosis Date  . Abdominal pain   . Anemia    hx of anemic  . Asthma   . Bipolar 1 disorder ([place])   . Blood in stool   . Change in voice   . Chest pain   . Constipation   . Depression   . Diverticulitis   . DVT (deep venous thrombosis) ([place]) 12/13/2014  . Dysrhythmia   . Fatigue   . Gallstones 04/10/2011   with biliary pancreatitis  . GERD (gastroesophageal reflux disease)   . Migraines   . MRSA carrier   . Nausea   .  Pancreatitis   . PTSD (post-traumatic stress disorder)   . Pulmonary embolism ([place]) 12/13/2014  . Sleep apnea    cpap  . Trouble swallowing     Tobacco Use: Social History   Tobacco Use  Smoking Status Never Smoker  Smokeless Tobacco Never Used    Labs: Recent Review Scientist, physiological    Labs for ITP Cardiac and Pulmonary Rehab Latest Ref Rng & Units 03/18/2014   Hemoglobin A1c 4.2 - 6.3 % 5.6       Pulmonary Assessment Scores: Pulmonary Assessment Scores    Row Name 08/10/17 1257         ADL UCSD   ADL Phase  Entry     SOB Score total  98     Rest  2     Walk  4     Stairs  5     Bath  4     Dress  3     Shop  4       CAT Score   CAT Score  39       mMRC Score   mMRC Score  2        Pulmonary Function Assessment: Pulmonary Function Assessment - 08/10/17 1256      Breath   Shortness of Breath  Fear of Shortness of Breath;Yes;Limiting activity  Exercise Target Goals:    Exercise Program Goal: Individual exercise prescription set using results from initial 6 min walk test and THRR while considering  patient's activity barriers and safety.    Exercise Prescription Goal: Initial exercise prescription builds to 30-45 minutes a day of aerobic activity, 2-3 days per week.  Home exercise guidelines will be given to patient during program as part of exercise prescription that the participant will acknowledge.  Activity Barriers & Risk Stratification: Activity Barriers & Cardiac Risk Stratification - 08/10/17 1247      Activity Barriers & Cardiac Risk Stratification   Activity Barriers  Back Problems;Shortness of Breath       6 Minute Walk: 6 Minute Walk    Row Name 08/10/17 1318         6 Minute Walk   Phase  Initial     Distance  1250 feet     Walk Time  6 minutes     # of Rest Breaks  0     MPH  2.37     METS  4.13     RPE  19     Perceived Dyspnea   4     VO2 Peak  14.47     Symptoms  Yes (comment)     Comments  back pain 7/10      Resting HR  77 bpm     Resting BP  126/76     Resting Oxygen Saturation   98 %     Exercise Oxygen Saturation  during 6 min walk  91 %     Max Ex. HR  138 bpm     Max Ex. BP  162/86     2 Minute Post BP  138/84       Interval HR   1 Minute HR  112     2 Minute HR  133     3 Minute HR  136     4 Minute HR  134     5 Minute HR  138     6 Minute HR  115     2 Minute Post HR  91     Interval Heart Rate?  Yes       Interval Oxygen   Interval Oxygen?  Yes     Baseline Oxygen Saturation %  98 %     1 Minute Liters of Oxygen  0 L     2 Minute Oxygen Saturation %  91 %     2 Minute Liters of Oxygen  0 L     3 Minute Oxygen Saturation %  94 %     3 Minute Liters of Oxygen  0 L     4 Minute Oxygen Saturation %  95 %     4 Minute Liters of Oxygen  0 L     5 Minute Oxygen Saturation %  94 %     5 Minute Liters of Oxygen  0 L     6 Minute Oxygen Saturation %  95 %     6 Minute Liters of Oxygen  0 L     2 Minute Post Oxygen Saturation %  100 %     2 Minute Post Liters of Oxygen  0 L       Oxygen Initial Assessment: Oxygen Initial Assessment - 08/10/17 1238      Home Oxygen   Home Oxygen Device  Home Concentrator    Sleep Oxygen Prescription  Continuous;CPAP  Liters per minute  2    Home Exercise Oxygen Prescription  None    Home at Rest Exercise Oxygen Prescription  None    Compliance with Home Oxygen Use  Yes      Initial 6 min Walk   Oxygen Used  None      Program Oxygen Prescription   Program Oxygen Prescription  None      Intervention   Short Term Goals  To learn and exhibit compliance with exercise, home and travel O2 prescription;To learn and understand importance of maintaining oxygen saturations>88%;To learn and demonstrate proper use of respiratory medications;To learn and understand importance of monitoring SPO2 with pulse oximeter and demonstrate accurate use of the pulse oximeter.;To learn and demonstrate proper pursed lip breathing techniques or other breathing  techniques.    Long  Term Goals  Exhibits compliance with exercise, home and travel O2 prescription;Verbalizes importance of monitoring SPO2 with pulse oximeter and return demonstration;Maintenance of O2 saturations>88%;Exhibits proper breathing techniques, such as pursed lip breathing or other method taught during program session;Compliance with respiratory medication;Demonstrates proper use of MDI's       Oxygen Re-Evaluation: Oxygen Re-Evaluation    Row Name 08/16/17 1029             Goals/Expected Outcomes   Short Term Goals  To learn and exhibit compliance with exercise, home and travel O2 prescription;To learn and understand importance of maintaining oxygen saturations>88%;To learn and demonstrate proper use of respiratory medications;To learn and understand importance of monitoring SPO2 with pulse oximeter and demonstrate accurate use of the pulse oximeter.;To learn and demonstrate proper pursed lip breathing techniques or other breathing techniques.       Long  Term Goals  Exhibits compliance with exercise, home and travel O2 prescription;Verbalizes importance of monitoring SPO2 with pulse oximeter and return demonstration;Maintenance of O2 saturations>88%;Exhibits proper breathing techniques, such as pursed lip breathing or other method taught during program session;Compliance with respiratory medication;Demonstrates proper use of MDI's       Comments  Reviewed PLB technique with pt.  Talked about how it work and it's important to maintaining his exercise saturations.       Goals/Expected Outcomes  Short: Become more profiecient at using PLB.   Long: Become independent at using PLB.          Oxygen Discharge (Final Oxygen Re-Evaluation): Oxygen Re-Evaluation - 08/16/17 1029      Goals/Expected Outcomes   Short Term Goals  To learn and exhibit compliance with exercise, home and travel O2 prescription;To learn and understand importance of maintaining oxygen saturations>88%;To  learn and demonstrate proper use of respiratory medications;To learn and understand importance of monitoring SPO2 with pulse oximeter and demonstrate accurate use of the pulse oximeter.;To learn and demonstrate proper pursed lip breathing techniques or other breathing techniques.    Long  Term Goals  Exhibits compliance with exercise, home and travel O2 prescription;Verbalizes importance of monitoring SPO2 with pulse oximeter and return demonstration;Maintenance of O2 saturations>88%;Exhibits proper breathing techniques, such as pursed lip breathing or other method taught during program session;Compliance with respiratory medication;Demonstrates proper use of MDI's    Comments  Reviewed PLB technique with pt.  Talked about how it work and it's important to maintaining his exercise saturations.    Goals/Expected Outcomes  Short: Become more profiecient at using PLB.   Long: Become independent at using PLB.       Initial Exercise Prescription: Initial Exercise Prescription - 08/10/17 1300      Date of Initial  Exercise RX and Referring Provider   Date  08/10/17    Referring Provider  Raul Del      Treadmill   MPH  2.3    Grade  0.5    Minutes  15    METs  2.92      NuStep   Level  2    SPM  80    Minutes  15    METs  3      REL-XR   Level  2    Speed  50    Minutes  15    METs  3      Biostep-RELP   Level  2    SPM  50    Minutes  15    METs  3      Prescription Details   Frequency (times per week)  3    Duration  Progress to 45 minutes of aerobic exercise without signs/symptoms of physical distress      Intensity   THRR 40-80% of Max Heartrate  116-156    Ratings of Perceived Exertion  11-13    Perceived Dyspnea  0-4      Resistance Training   Training Prescription  Yes    Weight  3 lb    Reps  10-15       Perform Capillary Blood Glucose checks as needed.  Exercise Prescription Changes:   Exercise Comments: Exercise Comments    Row Name 08/16/17 1028            Exercise Comments  First full day of exercise!  Patient was oriented to gym and equipment including functions, settings, policies, and procedures.  Patient's individual exercise prescription and treatment plan were reviewed.  All starting workloads were established based on the results of the 6 minute walk test done at initial orientation visit.  The plan for exercise progression was also introduced and progression will be customized based on patient's performance and goals.          Exercise Goals and Review: Exercise Goals    Row Name 08/10/17 1314             Exercise Goals   Increase Physical Activity  Yes       Intervention  Provide advice, education, support and counseling about physical activity/exercise needs.;Develop an individualized exercise prescription for aerobic and resistive training based on initial evaluation findings, risk stratification, comorbidities and participant's personal goals.       Expected Outcomes  Short Term: Attend rehab on a regular basis to increase amount of physical activity.;Long Term: Exercising regularly at least 3-5 days a week.       Increase Strength and Stamina  Yes       Intervention  Provide advice, education, support and counseling about physical activity/exercise needs.;Develop an individualized exercise prescription for aerobic and resistive training based on initial evaluation findings, risk stratification, comorbidities and participant's personal goals.       Expected Outcomes  Short Term: Increase workloads from initial exercise prescription for resistance, speed, and METs.;Long Term: Improve cardiorespiratory fitness, muscular endurance and strength as measured by increased METs and functional capacity (6MWT);Short Term: Perform resistance training exercises routinely during rehab and add in resistance training at home       Able to understand and use rate of perceived exertion (RPE) scale  Yes       Intervention  Provide education and  explanation on how to use RPE scale       Expected  Outcomes  Short Term: Able to use RPE daily in rehab to express subjective intensity level;Long Term:  Able to use RPE to guide intensity level when exercising independently       Able to understand and use Dyspnea scale  Yes       Intervention  Provide education and explanation on how to use Dyspnea scale       Expected Outcomes  Short Term: Able to use Dyspnea scale daily in rehab to express subjective sense of shortness of breath during exertion;Long Term: Able to use Dyspnea scale to guide intensity level when exercising independently       Knowledge and understanding of Target Heart Rate Range (THRR)  Yes       Intervention  Provide education and explanation of THRR including how the numbers were predicted and where they are located for reference       Expected Outcomes  Short Term: Able to state/look up THRR;Short Term: Able to use daily as guideline for intensity in rehab;Long Term: Able to use THRR to govern intensity when exercising independently       Able to check pulse independently  Yes       Intervention  Provide education and demonstration on how to check pulse in carotid and radial arteries.;Review the importance of being able to check your own pulse for safety during independent exercise       Expected Outcomes  Short Term: Able to explain why pulse checking is important during independent exercise;Long Term: Able to check pulse independently and accurately       Understanding of Exercise Prescription  Yes       Intervention  Provide education, explanation, and written materials on patient's individual exercise prescription       Expected Outcomes  Short Term: Able to explain program exercise prescription;Long Term: Able to explain home exercise prescription to exercise independently          Exercise Goals Re-Evaluation : Exercise Goals Re-Evaluation    Row Name 08/16/17 1028             Exercise Goal Re-Evaluation    Exercise Goals Review  Understanding of Exercise Prescription;Able to understand and use Dyspnea scale;Knowledge and understanding of Target Heart Rate Range (THRR);Able to understand and use rate of perceived exertion (RPE) scale       Comments  Reviewed RPE scale, THR and program prescription with pt today.  Pt voiced understanding and was given a copy of goals to take home.        Expected Outcomes  Short: Use RPE daily to regulate intensity.  Long: Follow program prescription in THR.          Discharge Exercise Prescription (Final Exercise Prescription Changes):   Nutrition:  Target Goals: Understanding of nutrition guidelines, daily intake of sodium '1500mg'$ , cholesterol '200mg'$ , calories 30% from fat and 7% or less from saturated fats, daily to have 5 or more servings of fruits and vegetables.  Biometrics: Pre Biometrics - 08/10/17 1313      Pre Biometrics   Height  '5\' 8"'$  (1.727 m)    Weight  268 lb 12.8 oz (121.9 kg)    Waist Circumference  42 inches    Hip Circumference  44.5 inches    Waist to Hip Ratio  0.94 %    BMI (Calculated)  40.88        Nutrition Therapy Plan and Nutrition Goals: Nutrition Therapy & Goals - 08/10/17 1247      Intervention  Plan   Intervention  Prescribe, educate and counsel regarding individualized specific dietary modifications aiming towards targeted core components such as weight, hypertension, lipid management, diabetes, heart failure and other comorbidities.    Expected Outcomes  Short Term Goal: Understand basic principles of dietary content, such as calories, fat, sodium, cholesterol and nutrients.;Short Term Goal: A plan has been developed with personal nutrition goals set during dietitian appointment.;Long Term Goal: Adherence to prescribed nutrition plan.       Nutrition Assessments: Nutrition Assessments - 08/10/17 1247      MEDFICTS Scores   Pre Score  46       Nutrition Goals Re-Evaluation:   Nutrition Goals Discharge (Final  Nutrition Goals Re-Evaluation):   Psychosocial: Target Goals: Acknowledge presence or absence of significant depression and/or stress, maximize coping skills, provide positive support system. Participant is able to verbalize types and ability to use techniques and skills needed for reducing stress and depression.   Initial Review & Psychosocial Screening: Initial Psych Review & Screening - 08/10/17 1249      Initial Review   Current issues with  Current Depression;History of Depression;Current Sleep Concerns;Current Stress Concerns    Source of Stress Concerns  Chronic Illness;Family;Transportation;Financial;Unable to perform yard/household activities;Unable to participate in former interests or hobbies    Comments  Currently sleeping only 2-4 hours. Waking up from coughing and not able to immediately go back to sleep. Applied for disability and was denied, now working with a Chief Executive Officer to reapply for disability benefits. Not able to manage any ADL's without needing to stop frequently because so short of breath      Family Dynamics   Good Support System?  Yes      Barriers   Psychosocial barriers to participate in program  There are no identifiable barriers or psychosocial needs.;The patient should benefit from training in stress management and relaxation.      Screening Interventions   Interventions  Encouraged to exercise;Provide feedback about the scores to participant;Program counselor consult;To provide support and resources with identified psychosocial needs    Expected Outcomes  Short Term goal: Utilizing psychosocial counselor, staff and physician to assist with identification of specific Stressors or current issues interfering with healing process. Setting desired goal for each stressor or current issue identified.;Long Term Goal: Stressors or current issues are controlled or eliminated.;Short Term goal: Identification and review with participant of any Quality of Life or Depression  concerns found by scoring the questionnaire.;Long Term goal: The participant improves quality of Life and PHQ9 Scores as seen by post scores and/or verbalization of changes       Quality of Life Scores:  Scores of 19 and below usually indicate a poorer quality of life in these areas.  A difference of  2-3 points is a clinically meaningful difference.  A difference of 2-3 points in the total score of the Quality of Life Index has been associated with significant improvement in overall quality of life, self-image, physical symptoms, and general health in studies assessing change in quality of life.  PHQ-9: Recent Review Flowsheet Data    Depression screen [facility] 2/9 08/10/2017   Decreased Interest 3   Down, Depressed, Hopeless 2   PHQ - 2 Score 5   Altered sleeping 3   Tired, decreased energy 3   Change in appetite 3   Feeling bad or failure about yourself  2   Trouble concentrating 3   Moving slowly or fidgety/restless 3   Suicidal thoughts 0   PHQ-9 Score 22  Difficult doing work/chores Extremely dIfficult     Interpretation of Total Score  Total Score Depression Severity:  1-4 = Minimal depression, 5-9 = Mild depression, 10-14 = Moderate depression, 15-19 = Moderately severe depression, 20-27 = Severe depression   Psychosocial Evaluation and Intervention: Psychosocial Evaluation - 08/18/17 1133      Psychosocial Evaluation & Interventions   Interventions  Encouraged to exercise with the program and follow exercise prescription;Stress management education;Relaxation education;Therapist referral    Comments  Counselor met with Lilliona today for initial psychsocial evaluation.  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  She has a strong support system with family close by and (3) children in the home - (1) who is 2 years old.  Veronda has struggled with GERD; Sleep Apnea and multiple diagnoses of mood disorders over the  years.  She reports sleeping only ~4 hours/ night sitting up with Oxygen.  Her appetite varies.  She reports having Bipolar in the past and is not on any current medications for this - as she "just moved back to [place] from [place]" and hasn't put providers in place yet.  Counselor reviewed the PHQ-9 with Andrey - scoring "22" which indicates severe depression.  She admits her mood is not stable currently and she has multiple stressors with her health and family.  Counselor provided a list of providers locally for Katryn to contact for treatment for her mental health.  Yocheved has goals to lose some weight and hopefully breathe better while in this program.  Counselor and staff will follow with Ceirra throughout the course of this program.      Expected Outcomes  Lavra will benefit from consistent exercise to achieve her stated goals.  The educational and psychoeducational components will be helpful in understanding her condition and coping more positively.  Kalah will meet with the dietician for addressing her weight loss goals.  She was given a list of providers to contact for mental health evaluations and treatment and agreed to put this in place.      Continue Psychosocial Services   Follow up required by counselor       Psychosocial Re-Evaluation:   Psychosocial Discharge (Final Psychosocial Re-Evaluation):   Education: Education Goals: Education classes will be provided on a weekly basis, covering required topics. Participant will state understanding/return demonstration of topics presented.  Learning Barriers/Preferences: Learning Barriers/Preferences - 08/10/17 1254      Learning Barriers/Preferences   Learning Barriers  Reading In past year it takes longer to comprehend when reading.    Learning Preferences  Individual Instruction       Education Topics:  Initial Evaluation Education: - Verbal, written and demonstration of respiratory meds, oximetry and breathing techniques. Instruction on use  of nebulizers and MDIs and importance of monitoring MDI activations.   Pulmonary Rehab from 08/16/2017 in [facility] Cardiac and Pulmonary Rehab  Date  08/10/17  Educator  SB  Instruction Review Code  1- Verbalizes Understanding      General Nutrition Guidelines/Fats and Fiber: -Group instruction provided by verbal, written material, models and posters to present the general guidelines for heart healthy nutrition. Gives an explanation and review of dietary fats and fiber.   Controlling Sodium/Reading Food Labels: -Group verbal and written material supporting the discussion of sodium use in heart healthy nutrition. Review and explanation with models, verbal and written materials for utilization of the food label.   Pulmonary Rehab from 08/16/2017  in [facility] Cardiac and Pulmonary Rehab  Date  08/16/17  Educator  PI  Instruction Review Code  1- Verbalizes Understanding      Exercise Physiology & General Exercise Guidelines: - Group verbal and written instruction with models to review the exercise physiology of the cardiovascular system and associated critical values. Provides general exercise guidelines with specific guidelines to those with heart or lung disease.    Aerobic Exercise & Resistance Training: - Gives group verbal and written instruction on the various components of exercise. Focuses on aerobic and resistive training programs and the benefits of this training and how to safely progress through these programs.   Flexibility, Balance, Mind/Body Relaxation: Provides group verbal/written instruction on the benefits of flexibility and balance training, including mind/body exercise modes such as yoga, pilates and tai chi.  Demonstration and skill practice provided.   Stress and Anxiety: - Provides group verbal and written instruction about the health risks of elevated stress and causes of high stress.  Discuss the correlation between heart/lung disease and anxiety and treatment options.  Review healthy ways to manage with stress and anxiety.   Depression: - Provides group verbal and written instruction on the correlation between heart/lung disease and depressed mood, treatment options, and the stigmas associated with seeking treatment.   Exercise & Equipment Safety: - Individual verbal instruction and demonstration of equipment use and safety with use of the equipment.   Pulmonary Rehab from 08/16/2017 in [facility] Cardiac and Pulmonary Rehab  Date  08/10/17  Educator  Sb  Instruction Review Code  1- Verbalizes Understanding      Infection Prevention: - Provides verbal and written material to individual with discussion of infection control including proper hand washing and proper equipment cleaning during exercise session.   Pulmonary Rehab from 08/16/2017 in [facility] Cardiac and Pulmonary Rehab  Date  08/10/17  Educator  SB  Instruction Review Code  1- Verbalizes Understanding      Falls Prevention: - Provides verbal and written material to individual with discussion of falls prevention and safety.   Diabetes: - Individual verbal and written instruction to review signs/symptoms of diabetes, desired ranges of glucose level fasting, after meals and with exercise. Advice that pre and post exercise glucose checks will be done for 3 sessions at entry of program.   Chronic Lung Diseases: - Group verbal and written instruction to review updates, respiratory medications, advancements in procedures and treatments. Discuss use of supplemental oxygen including available portable oxygen systems, continuous and intermittent flow rates, concentrators, personal use and safety guidelines. Review proper use of inhaler and spacers. Provide informative websites for self-education.    Energy Conservation: - Provide group verbal and written instruction for methods to conserve energy, plan and organize activities. Instruct on pacing techniques, use of adaptive equipment and posture/positioning  to relieve shortness of breath.   Triggers and Exacerbations: - Group verbal and written instruction to review types of environmental triggers and ways to prevent exacerbations. Discuss weather changes, air quality and the benefits of nasal washing. Review warning signs and symptoms to help prevent infections. Discuss techniques for effective airway clearance, coughing, and vibrations.   AED/CPR: - Group verbal and written instruction with the use of models to demonstrate the basic use of the AED with the basic ABC's of resuscitation.   Anatomy and Physiology of the Lungs: - Group verbal and written instruction with the use of models to provide basic lung anatomy and physiology related to function, structure and complications of lung disease.  Anatomy & Physiology of the Heart: - Group verbal and written instruction and models provide basic cardiac anatomy and physiology, with the coronary electrical and arterial systems. Review of Valvular disease and Heart Failure   Cardiac Medications: - Group verbal and written instruction to review commonly prescribed medications for heart disease. Reviews the medication, class of the drug, and side effects.   Know Your Numbers and Risk Factors: -Group verbal and written instruction about important numbers in your health.  Discussion of what are risk factors and how they play a role in the disease process.  Review of Cholesterol, Blood Pressure, Diabetes, and BMI and the role they play in your overall health.   Sleep Hygiene: -Provides group verbal and written instruction about how sleep can affect your health.  Define sleep hygiene, discuss sleep cycles and impact of sleep habits. Review good sleep hygiene tips.    Other: -Provides group and verbal instruction on various topics (see comments)    Knowledge Questionnaire Score: Knowledge Questionnaire Score - 08/10/17 1255      Knowledge Questionnaire Score   Pre Score  15/18 Reviewed  correct responses with Kendalynn and she verbalized understanding of the responses.  She had no further questions today.        Core Components/Risk Factors/Patient Goals at Admission: Personal Goals and Risk Factors at Admission - 08/10/17 1248      Core Components/Risk Factors/Patient Goals on Admission    Weight Management  Yes;Obesity    Intervention  Weight Management: Develop a combined nutrition and exercise program designed to reach desired caloric intake, while maintaining appropriate intake of nutrient and fiber, sodium and fats, and appropriate energy expenditure required for the weight goal.;Weight Management/Obesity: Establish reasonable short term and long term weight goals.;Obesity: Provide education and appropriate resources to help participant work on and attain dietary goals.    Admit Weight  268 lb 12.8 oz (121.9 kg)    Goal Weight: Short Term  265 lb (120.2 kg)    Goal Weight: Long Term  180 lb (81.6 kg)    Expected Outcomes  Short Term: Continue to assess and modify interventions until short term weight is achieved;Long Term: Adherence to nutrition and physical activity/exercise program aimed toward attainment of established weight goal;Weight Loss: Understanding of general recommendations for a balanced deficit meal plan, which promotes 1-2 lb weight loss per week and includes a negative energy balance of [phone] kcal/d    Improve shortness of breath with ADL's  Yes    Intervention  Provide education, individualized exercise plan and daily activity instruction to help decrease symptoms of SOB with activities of daily living.    Expected Outcomes  Short Term: Improve cardiorespiratory fitness to achieve a reduction of symptoms when performing ADLs;Long Term: Be able to perform more ADLs without symptoms or delay the onset of symptoms    Hypertension  Yes    Intervention  Provide education on lifestyle modifcations including regular physical activity/exercise, weight management,  moderate sodium restriction and increased consumption of fresh fruit, vegetables, and low fat dairy, alcohol moderation, and smoking cessation.;Monitor prescription use compliance.    Expected Outcomes  Short Term: Continued assessment and intervention until BP is < 140/18m HG in hypertensive participants. < 130/84mHG in hypertensive participants with diabetes, heart failure or chronic kidney disease.;Long Term: Maintenance of blood pressure at goal levels.    Stress  Yes    Intervention  Offer individual and/or small group education and counseling on adjustment to heart disease, stress management and  health-related lifestyle change. Teach and support self-help strategies.;Refer participants experiencing significant psychosocial distress to appropriate mental health specialists for further evaluation and treatment. When possible, include family members and significant others in education/counseling sessions.    Expected Outcomes  Short Term: Participant demonstrates changes in health-related behavior, relaxation and other stress management skills, ability to obtain effective social support, and compliance with psychotropic medications if prescribed.;Long Term: Emotional wellbeing is indicated by absence of clinically significant psychosocial distress or social isolation.       Core Components/Risk Factors/Patient Goals Review:    Core Components/Risk Factors/Patient Goals at Discharge (Final Review):    ITP Comments: ITP Comments    Row Name 08/10/17 1235 08/10/17 1301 08/23/17 0921       ITP Comments  Medical review completed today   ITP sent to Dr Loleta Chance for review, changes as needed and signature.  Documentation of diagnosis [facility] be found in [facility] encounter[facility]14[facility]2019  Ahlia is working on weight loss to be eligible to talk to the lung transplant team.   30 day review completed. ITP sent to Dr. Emily Filbert Director of [place]. Continue with ITP unless changes are made by physician.         Comments: 30 day review

## 2017-08-23 NOTE — Progress Notes (Signed)
Daily Session Note  Patient Details  Name: Lisa [name] MRN: [id number] Date of Birth: 29-Apr-1971 Referring Provider:     Pulmonary Rehab from 08/10/2017 in [facility] Cardiac and Pulmonary Rehab  Referring Provider  Raul Del      Encounter Date: 08/23/2017  Check In: Session Check In - 08/23/17 1023      Check-In   Location  [facility]-Cardiac & Pulmonary Rehab    Staff Present  Nada Maclachlan, BA, ACSM CEP, Exercise Physiologist;Kelly Amedeo Plenty, BS, ACSM CEP, Exercise Physiologist;Angelo Prindle Flavia Shipper    Supervising physician immediately available to respond to emergencies  [facility] immediately available ER MD    Physician(s)  Dr. Corky Downs and Rifenbark    Medication changes reported      No    Fall or balance concerns reported     No    Warm-up and Cool-down  Performed as group-led instruction    Resistance Training Performed  Yes    VAD Patient?  No      Pain Assessment   Currently in Pain?  No/denies          Social History   Tobacco Use  Smoking Status Never Smoker  Smokeless Tobacco Never Used    Goals Met:  Independence with exercise equipment Exercise tolerated well No report of cardiac concerns or symptoms Strength training completed today  Goals Unmet:  Not Applicable  Comments: Pt able to follow exercise prescription today without complaint.  Will continue to monitor for progression.   Dr. Emily Filbert is Medical Director for [place] and [facility].

## 2017-08-25 DIAGNOSIS — J45909 Unspecified asthma, uncomplicated: Secondary | ICD-10-CM

## 2017-08-25 NOTE — Progress Notes (Signed)
Daily Session Note  Patient Details  Name: Lisa [name] MRN: [id number] Date of Birth: March 28, 1971 Referring Provider:     Pulmonary Rehab from 08/10/2017 in [facility] Cardiac and Pulmonary Rehab  Referring Provider  Raul Del      Encounter Date: 08/25/2017  Check In: Session Check In - 08/25/17 1005      Check-In   Location  [facility]-Cardiac & Pulmonary Rehab    Staff Present  Alberteen Sam, MA, RCEP, CCRP, Exercise Physiologist;Amanda Oletta Darter, [place], ACSM CEP, Exercise Physiologist;Olanda Downie Flavia Shipper    Supervising physician immediately available to respond to emergencies  [facility] immediately available ER MD    Physician(s)  Dr. Burlene Arnt and Alfred Levins    Medication changes reported      No    Fall or balance concerns reported     No    Warm-up and Cool-down  Performed as group-led instruction    Resistance Training Performed  Yes    VAD Patient?  No      Pain Assessment   Currently in Pain?  No/denies          Social History   Tobacco Use  Smoking Status Never Smoker  Smokeless Tobacco Never Used    Goals Met:  Independence with exercise equipment Exercise tolerated well No report of cardiac concerns or symptoms Strength training completed today  Goals Unmet:  Not Applicable  Comments: Reviewed home exercise with pt today.  Pt plans to go the gym 1 extra day for exercise.  Reviewed THR, pulse, RPE, sign and symptoms, NTG use, and when to call 911 or MD.  Also discussed weather considerations and indoor options.  Pt voiced understanding.  Pt voiced understanding. Pt able to follow exercise prescription today without complaint.  Will continue to monitor for progression.   Dr. Emily Filbert is Medical Director for [place] and [facility].

## 2017-08-30 DIAGNOSIS — J45909 Unspecified asthma, uncomplicated: Secondary | ICD-10-CM | POA: Diagnosis not present

## 2017-08-30 NOTE — Progress Notes (Signed)
Daily Session Note  Patient Details  Name: Lisa [name] MRN: [id number] Date of Birth: Dec 04, 1970 Referring Provider:     Pulmonary Rehab from 08/10/2017 in [facility] Cardiac and Pulmonary Rehab  Referring Provider  Raul Del      Encounter Date: 08/30/2017  Check In: Session Check In - 08/30/17 1018      Check-In   Location  [facility]-Cardiac & Pulmonary Rehab    Staff Present  Nada Maclachlan, BA, ACSM CEP, Exercise Physiologist;Kelly Amedeo Plenty, BS, ACSM CEP, Exercise Physiologist;Paitlyn Mcclatchey Flavia Shipper    Supervising physician immediately available to respond to emergencies  [facility] immediately available ER MD    Physician(s)  Jimmye Norman and Alfred Levins    Medication changes reported      No    Fall or balance concerns reported     No    Tobacco Cessation  No Change    Warm-up and Cool-down  Performed as group-led instruction    Resistance Training Performed  Yes    VAD Patient?  No      Pain Assessment   Currently in Pain?  No/denies          Social History   Tobacco Use  Smoking Status Never Smoker  Smokeless Tobacco Never Used    Goals Met:  Independence with exercise equipment Exercise tolerated well No report of cardiac concerns or symptoms Strength training completed today  Goals Unmet:  Not Applicable  Comments: Pt able to follow exercise prescription today without complaint.  Will continue to monitor for progression.   Dr. Emily Filbert is Medical Director for [place] and [facility].

## 2017-09-01 DIAGNOSIS — J45909 Unspecified asthma, uncomplicated: Secondary | ICD-10-CM | POA: Diagnosis not present

## 2017-09-01 NOTE — Progress Notes (Signed)
Daily Session Note  Patient Details  Name: Lisa [name] MRN: [id number] Date of Birth: [birth year] Referring Provider:     Pulmonary Rehab from 08/10/2017 in [facility] Cardiac and Pulmonary Rehab  Referring Provider  Raul Del      Encounter Date: 09/01/2017  Check In: Session Check In - 09/01/17 1015      Check-In   Location  [facility]-Cardiac & Pulmonary Rehab    Staff Present  Nada Maclachlan, BA, ACSM CEP, Exercise Physiologist;Jessica Luan Pulling, MA, RCEP, CCRP, Exercise Physiologist;Joseph Flavia Shipper    Supervising physician immediately available to respond to emergencies  [facility] immediately available ER MD    Physician(s)  Dr. Reita Cliche and Quentin Cornwall    Medication changes reported      No    Fall or balance concerns reported     No    Warm-up and Cool-down  Performed as group-led instruction    Resistance Training Performed  Yes    VAD Patient?  No      Pain Assessment   Currently in Pain?  No/denies          Social History   Tobacco Use  Smoking Status Never Smoker  Smokeless Tobacco Never Used    Goals Met:  Independence with exercise equipment Exercise tolerated well No report of cardiac concerns or symptoms Strength training completed today  Goals Unmet:  Not Applicable  Comments: Pt able to follow exercise prescription today without complaint.  Will continue to monitor for progression.   Dr. Emily Filbert is Medical Director for [place] and [facility].

## 2017-09-03 DIAGNOSIS — J45909 Unspecified asthma, uncomplicated: Secondary | ICD-10-CM | POA: Diagnosis not present

## 2017-09-03 NOTE — Progress Notes (Signed)
Daily Session Note  Patient Details  Name: Lisa [name] MRN: [id number] Date of Birth: 02/27/1971 Referring Provider:     Pulmonary Rehab from 08/10/2017 in [facility] Cardiac and Pulmonary Rehab  Referring Provider  [place]      Encounter Date: 09/03/2017  Check In: Session Check In - 09/03/17 1028      Check-In   Location  [facility]-Cardiac & Pulmonary Rehab    Staff Present  Meredith Craven, RN BSN;Mandi Ballard, BS, PEC;  RCP,RRT,BSRT    Supervising physician immediately available to respond to emergencies  [facility] immediately available ER MD    Physician(s)  Dr. Quale and Paduchowski    Medication changes reported      No    Fall or balance concerns reported     No    Warm-up and Cool-down  Performed as group-led instruction    Resistance Training Performed  Yes    VAD Patient?  No      Pain Assessment   Currently in Pain?  No/denies          Social History   Tobacco Use  Smoking Status Never Smoker  Smokeless Tobacco Never Used    Goals Met:  Independence with exercise equipment Exercise tolerated well No report of cardiac concerns or symptoms Strength training completed today  Goals Unmet:  Not Applicable  Comments: Pt able to follow exercise prescription today without complaint.  Will continue to monitor for progression.   Dr. Mark Miller is Medical Director for [facility] and [facility]. 

## 2017-09-06 DIAGNOSIS — J45909 Unspecified asthma, uncomplicated: Secondary | ICD-10-CM

## 2017-09-06 NOTE — Progress Notes (Signed)
Daily Session Note  Patient Details  Name: Lisa [name] MRN: [id number] Date of Birth: [birth year]  Referring Provider:     Pulmonary Rehab from 08/10/2017 in [facility] Cardiac and Pulmonary Rehab  Referring Provider  Raul Del      Encounter Date: 09/06/2017  Check In: Session Check In - 09/06/17 1115      Check-In   Location  [facility]-Cardiac & Pulmonary Rehab    Staff Present  Earlean Shawl, BS, ACSM CEP, Exercise Physiologist;Amanda Oletta Darter, BA, ACSM CEP, Exercise Physiologist;Antoninette Lerner Flavia Shipper    Supervising physician immediately available to respond to emergencies  [facility] immediately available ER MD    Physician(s)  Dr. Reita Cliche and Mariea Clonts    Medication changes reported      No    Fall or balance concerns reported     No    Warm-up and Cool-down  Performed as group-led instruction    Resistance Training Performed  Yes    VAD Patient?  No      Pain Assessment   Currently in Pain?  No/denies          Social History   Tobacco Use  Smoking Status Never Smoker  Smokeless Tobacco Never Used    Goals Met:  Independence with exercise equipment Exercise tolerated well No report of cardiac concerns or symptoms Strength training completed today  Goals Unmet:  Not Applicable  Comments: Pt able to follow exercise prescription today without complaint.  Will continue to monitor for progression.   Dr. Emily Filbert is Medical Director for [place] and [facility].

## 2017-09-08 DIAGNOSIS — J45909 Unspecified asthma, uncomplicated: Secondary | ICD-10-CM | POA: Diagnosis not present

## 2017-09-08 NOTE — Progress Notes (Signed)
Daily Session Note  Patient Details  Name: Lisa [name] MRN: [id number] Date of Birth: 01-Oct-1970 Referring Provider:     Pulmonary Rehab from 08/10/2017 in [facility] Cardiac and Pulmonary Rehab  Referring Provider  Raul Del      Encounter Date: 09/08/2017  Check In: Session Check In - 09/08/17 1109      Check-In   Location  [facility]-Cardiac & Pulmonary Rehab    Staff Present  Justin Mend Lorre Nick, MA, RCEP, CCRP, Exercise Physiologist;Amanda Oletta Darter, [place], ACSM CEP, Exercise Physiologist    Supervising physician immediately available to respond to emergencies  [facility] immediately available ER MD    Physician(s)  Dr. Reita Cliche and Jimmye Norman    Medication changes reported      No    Fall or balance concerns reported     No    Tobacco Cessation  No Change    Warm-up and Cool-down  Performed as group-led instruction    Resistance Training Performed  Yes    VAD Patient?  No      Pain Assessment   Currently in Pain?  No/denies          Social History   Tobacco Use  Smoking Status Never Smoker  Smokeless Tobacco Never Used    Goals Met:  Independence with exercise equipment Exercise tolerated well No report of cardiac concerns or symptoms Strength training completed today  Goals Unmet:  Not Applicable  Comments: Pt able to follow exercise prescription today without complaint.  Will continue to monitor for progression.   Dr. Emily Filbert is Medical Director for [place] and [facility].

## 2017-09-10 ENCOUNTER — Encounter: Payer: Medicaid Other | Attending: Specialist | Admitting: *Deleted

## 2017-09-10 DIAGNOSIS — J45909 Unspecified asthma, uncomplicated: Secondary | ICD-10-CM | POA: Insufficient documentation

## 2017-09-10 NOTE — Progress Notes (Signed)
Daily Session Note  Patient Details  Name: Jalexis Breed MRN: [id number] Date of Birth: 08-23-1970 Referring Provider:     Pulmonary Rehab from 08/10/2017 in [facility] Cardiac and Pulmonary Rehab  Referring Provider  Raul Del      Encounter Date: 09/10/2017  Check In: Session Check In - 09/10/17 1051      Check-In   Location  [facility]-Cardiac & Pulmonary Rehab    Staff Present  Renita Papa, RN Vickki Hearing, BA, ACSM CEP, Exercise Physiologist;Krista Frederico Hamman, RN BSN    Supervising physician immediately available to respond to emergencies  [facility] immediately available ER MD    Physician(s)  Dr. Kerman Passey and Clearnce Hasten    Medication changes reported      No    Fall or balance concerns reported     No    Warm-up and Cool-down  Performed as group-led instruction    Resistance Training Performed  Yes    VAD Patient?  No      Pain Assessment   Currently in Pain?  No/denies        Exercise Prescription Changes - 09/10/17 0800      Response to Exercise   Blood Pressure (Admit)  106/64    Blood Pressure (Exit)  132/82    Heart Rate (Admit)  102 bpm    Heart Rate (Exercise)  132 bpm    Heart Rate (Exit)  98 bpm    Oxygen Saturation (Admit)  98 %    Oxygen Saturation (Exercise)  94 %    Oxygen Saturation (Exit)  98 %    Rating of Perceived Exertion (Exercise)  14    Perceived Dyspnea (Exercise)  3    Symptoms  none    Duration  Continue with 45 min of aerobic exercise without signs/symptoms of physical distress.    Intensity  THRR unchanged      Progression   Progression  Continue to progress workloads to maintain intensity without signs/symptoms of physical distress.    Average METs  3.3      Resistance Training   Training Prescription  Yes    Weight  3 lb    Reps  10-15      Interval Training   Interval Training  No      Treadmill   MPH  2.3    Grade  0.5    Minutes  15    METs  2.92      REL-XR   Level  5    Speed  71    Minutes  15    METs  3.7       Home Exercise Plan   Plans to continue exercise at  [employer] (comment) Gym    Frequency  Add 1 additional day to program exercise sessions.    Initial Home Exercises Provided  08/25/17       Social History   Tobacco Use  Smoking Status Never Smoker  Smokeless Tobacco Never Used    Goals Met:  Proper associated with RPD/PD & O2 Sat Independence with exercise equipment Using PLB without cueing & demonstrates good technique Exercise tolerated well Strength training completed today  Goals Unmet:  Not Applicable  Comments: Pt able to follow exercise prescription today without complaint.  Will continue to monitor for progression.    Dr. Emily Filbert is Medical Director for [place] and [facility].

## 2017-09-13 ENCOUNTER — Encounter: Payer: Medicaid Other | Admitting: *Deleted

## 2017-09-13 DIAGNOSIS — J45909 Unspecified asthma, uncomplicated: Secondary | ICD-10-CM | POA: Diagnosis not present

## 2017-09-13 NOTE — Progress Notes (Signed)
Daily Session Note  Patient Details  Name: Lisa [name] MRN: [id number] Date of Birth: 05-14-1971 Referring Provider:     Pulmonary Rehab from 08/10/2017 in [facility] Cardiac and Pulmonary Rehab  Referring Provider  Raul Del      Encounter Date: 09/13/2017  Check In: Session Check In - 09/13/17 1012      Check-In   Location  [facility]-Cardiac & Pulmonary Rehab    Staff Present  Nada Maclachlan, BA, ACSM CEP, Exercise Physiologist;Kelly Amedeo Plenty, BS, ACSM CEP, Exercise Physiologist;Joseph Flavia Shipper    Supervising physician immediately available to respond to emergencies  [facility] immediately available ER MD    Physician(s)  Dr. Burlene Arnt and Jimmye Norman    Medication changes reported      No    Fall or balance concerns reported     No    Warm-up and Cool-down  Performed as group-led instruction    Resistance Training Performed  Yes    VAD Patient?  No      Pain Assessment   Currently in Pain?  No/denies          Social History   Tobacco Use  Smoking Status Never Smoker  Smokeless Tobacco Never Used    Goals Met:  Proper associated with RPD/PD & O2 Sat Independence with exercise equipment Using PLB without cueing & demonstrates good technique Exercise tolerated well No report of cardiac concerns or symptoms Strength training completed today  Personal goals reviewed  Goals Unmet:  Not Applicable  Comments: Pt able to follow exercise prescription today without complaint.  Will continue to monitor for progression.    Dr. Emily Filbert is Medical Director for [place] and [facility].

## 2017-09-17 ENCOUNTER — Encounter: Payer: Medicaid Other | Admitting: *Deleted

## 2017-09-17 DIAGNOSIS — J45909 Unspecified asthma, uncomplicated: Secondary | ICD-10-CM

## 2017-09-17 NOTE — Progress Notes (Signed)
Daily Session Note  Patient Details  Name: Lisa [name] MRN: [id number] Date of Birth: 1971/01/06 Referring Provider:     Pulmonary Rehab from 08/10/2017 in [facility] Cardiac and Pulmonary Rehab  Referring Provider  Raul Del      Encounter Date: 09/17/2017  Check In: Session Check In - 09/17/17 1053      Check-In   Location  [facility]-Cardiac & Pulmonary Rehab    Staff Present  Renita Papa, RN Vickki Hearing, BA, ACSM CEP, Exercise Physiologist;Mary Kellie Shropshire, RN, BSN, MA    Supervising physician immediately available to respond to emergencies  [facility] immediately available ER MD    Physician(s)   Dr. Reita Cliche and Archie Balboa    Medication changes reported      No    Fall or balance concerns reported     No    Warm-up and Cool-down  Performed as group-led instruction    Resistance Training Performed  Yes    VAD Patient?  No      Pain Assessment   Currently in Pain?  No/denies          Social History   Tobacco Use  Smoking Status Never Smoker  Smokeless Tobacco Never Used    Goals Met:  Proper associated with RPD/PD & O2 Sat Independence with exercise equipment Using PLB without cueing & demonstrates good technique Exercise tolerated well Strength training completed today  Goals Unmet:  Not Applicable  Comments: Pt able to follow exercise prescription today without complaint.  Will continue to monitor for progression.    Dr. Emily Filbert is Medical Director for [place] and [facility].

## 2017-09-20 DIAGNOSIS — J45909 Unspecified asthma, uncomplicated: Secondary | ICD-10-CM

## 2017-09-20 NOTE — Progress Notes (Signed)
Pulmonary Individual Treatment Plan  Patient Details  Name: Lisa [name] MRN: [id number] Date of Birth: 04/02/1971 Referring Provider:     Pulmonary Rehab from 08/10/2017 in [facility] Cardiac and Pulmonary Rehab  Referring Provider  Raul Del      Initial Encounter Date:    Pulmonary Rehab from 08/10/2017 in [facility] Cardiac and Pulmonary Rehab  Date  08/10/17  Referring Provider  Raul Del      Visit Diagnosis: Uncomplicated asthma, unspecified asthma severity, unspecified whether persistent  Patient's Home Medications on Admission:  Current Outpatient Medications:  .  albuterol (PROVENTIL HFA;VENTOLIN HFA) 108 (90 Base) MCG/ACT inhaler, Inhale 2 puffs into the lungs every 6 (six) hours as needed for wheezing or shortness of breath., Disp: 1 Inhaler, Rfl: 2 .  azaTHIOprine (IMURAN) 50 MG tablet, Take by mouth., Disp: , Rfl:  .  ferrous sulfate 325 (65 FE) MG tablet, Take 325 mg by mouth daily with breakfast., Disp: , Rfl:  .  gabapentin (NEURONTIN) 100 MG capsule, Take by mouth., Disp: , Rfl:  .  Ipratropium-Albuterol (COMBIVENT) 20-100 MCG/ACT AERS respimat, Inhale 1 puff into the lungs every 6 (six) hours., Disp: , Rfl:  .  montelukast (SINGULAIR) 10 MG tablet, Take by mouth., Disp: , Rfl:  .  omeprazole (PRILOSEC) 40 MG capsule, Take by mouth., Disp: , Rfl:  .  phentermine 37.5 MG capsule, Take by mouth., Disp: , Rfl:  .  predniSONE (DELTASONE) 10 MG tablet, Take 20 mg by mouth daily., Disp: , Rfl:   Past Medical History: Past Medical History:  Diagnosis Date  . Abdominal pain   . Anemia    hx of anemic  . Asthma   . Bipolar 1 disorder ([place])   . Blood in stool   . Change in voice   . Chest pain   . Constipation   . Depression   . Diverticulitis   . DVT (deep venous thrombosis) ([place]) 12/13/2014  . Dysrhythmia   . Fatigue   . Gallstones 04/10/2011   with biliary pancreatitis  . GERD (gastroesophageal reflux disease)   . Migraines   . MRSA carrier   . Nausea   .  Pancreatitis   . PTSD (post-traumatic stress disorder)   . Pulmonary embolism ([place]) 12/13/2014  . Sleep apnea    cpap  . Trouble swallowing     Tobacco Use: Social History   Tobacco Use  Smoking Status Never Smoker  Smokeless Tobacco Never Used    Labs: Recent Review Scientist, physiological    Labs for ITP Cardiac and Pulmonary Rehab Latest Ref Rng & Units 03/18/2014   Hemoglobin A1c 4.2 - 6.3 % 5.6       Pulmonary Assessment Scores: Pulmonary Assessment Scores    Row Name 08/10/17 1257         ADL UCSD   ADL Phase  Entry     SOB Score total  98     Rest  2     Walk  4     Stairs  5     Bath  4     Dress  3     Shop  4       CAT Score   CAT Score  39       mMRC Score   mMRC Score  2        Pulmonary Function Assessment: Pulmonary Function Assessment - 08/10/17 1256      Breath   Shortness of Breath  Fear of Shortness of Breath;Yes;Limiting activity  Exercise Target Goals:    Exercise Program Goal: Individual exercise prescription set using results from initial 6 min walk test and THRR while considering  patient's activity barriers and safety.    Exercise Prescription Goal: Initial exercise prescription builds to 30-45 minutes a day of aerobic activity, 2-3 days per week.  Home exercise guidelines will be given to patient during program as part of exercise prescription that the participant will acknowledge.  Activity Barriers & Risk Stratification: Activity Barriers & Cardiac Risk Stratification - 08/10/17 1247      Activity Barriers & Cardiac Risk Stratification   Activity Barriers  Back Problems;Shortness of Breath       6 Minute Walk: 6 Minute Walk    Row Name 08/10/17 1318         6 Minute Walk   Phase  Initial     Distance  1250 feet     Walk Time  6 minutes     # of Rest Breaks  0     MPH  2.37     METS  4.13     RPE  19     Perceived Dyspnea   4     VO2 Peak  14.47     Symptoms  Yes (comment)     Comments  back pain 7/10      Resting HR  77 bpm     Resting BP  126/76     Resting Oxygen Saturation   98 %     Exercise Oxygen Saturation  during 6 min walk  91 %     Max Ex. HR  138 bpm     Max Ex. BP  162/86     2 Minute Post BP  138/84       Interval HR   1 Minute HR  112     2 Minute HR  133     3 Minute HR  136     4 Minute HR  134     5 Minute HR  138     6 Minute HR  115     2 Minute Post HR  91     Interval Heart Rate?  Yes       Interval Oxygen   Interval Oxygen?  Yes     Baseline Oxygen Saturation %  98 %     1 Minute Liters of Oxygen  0 L     2 Minute Oxygen Saturation %  91 %     2 Minute Liters of Oxygen  0 L     3 Minute Oxygen Saturation %  94 %     3 Minute Liters of Oxygen  0 L     4 Minute Oxygen Saturation %  95 %     4 Minute Liters of Oxygen  0 L     5 Minute Oxygen Saturation %  94 %     5 Minute Liters of Oxygen  0 L     6 Minute Oxygen Saturation %  95 %     6 Minute Liters of Oxygen  0 L     2 Minute Post Oxygen Saturation %  100 %     2 Minute Post Liters of Oxygen  0 L       Oxygen Initial Assessment: Oxygen Initial Assessment - 08/10/17 1238      Home Oxygen   Home Oxygen Device  Home Concentrator    Sleep Oxygen Prescription  Continuous;CPAP  Liters per minute  2    Home Exercise Oxygen Prescription  None    Home at Rest Exercise Oxygen Prescription  None    Compliance with Home Oxygen Use  Yes      Initial 6 min Walk   Oxygen Used  None      Program Oxygen Prescription   Program Oxygen Prescription  None      Intervention   Short Term Goals  To learn and exhibit compliance with exercise, home and travel O2 prescription;To learn and understand importance of maintaining oxygen saturations>88%;To learn and demonstrate proper use of respiratory medications;To learn and understand importance of monitoring SPO2 with pulse oximeter and demonstrate accurate use of the pulse oximeter.;To learn and demonstrate proper pursed lip breathing techniques or other breathing  techniques.    Long  Term Goals  Exhibits compliance with exercise, home and travel O2 prescription;Verbalizes importance of monitoring SPO2 with pulse oximeter and return demonstration;Maintenance of O2 saturations>88%;Exhibits proper breathing techniques, such as pursed lip breathing or other method taught during program session;Compliance with respiratory medication;Demonstrates proper use of MDI's       Oxygen Re-Evaluation: Oxygen Re-Evaluation    Row Name 08/16/17 1029 09/13/17 1105           Program Oxygen Prescription   Program Oxygen Prescription  -  None        Home Oxygen   Home Oxygen Device  -  Home Concentrator      Sleep Oxygen Prescription  -  Continuous      Liters per minute  -  2      Home Exercise Oxygen Prescription  -  None      Home at Rest Exercise Oxygen Prescription  -  None      Compliance with Home Oxygen Use  -  Yes        Goals/Expected Outcomes   Short Term Goals  To learn and exhibit compliance with exercise, home and travel O2 prescription;To learn and understand importance of maintaining oxygen saturations>88%;To learn and demonstrate proper use of respiratory medications;To learn and understand importance of monitoring SPO2 with pulse oximeter and demonstrate accurate use of the pulse oximeter.;To learn and demonstrate proper pursed lip breathing techniques or other breathing techniques.  To learn and exhibit compliance with exercise, home and travel O2 prescription;To learn and understand importance of maintaining oxygen saturations>88%;To learn and demonstrate proper use of respiratory medications;To learn and understand importance of monitoring SPO2 with pulse oximeter and demonstrate accurate use of the pulse oximeter.;To learn and demonstrate proper pursed lip breathing techniques or other breathing techniques.      Long  Term Goals  Exhibits compliance with exercise, home and travel O2 prescription;Verbalizes importance of monitoring SPO2 with  pulse oximeter and return demonstration;Maintenance of O2 saturations>88%;Exhibits proper breathing techniques, such as pursed lip breathing or other method taught during program session;Compliance with respiratory medication;Demonstrates proper use of MDI's  Exhibits compliance with exercise, home and travel O2 prescription;Verbalizes importance of monitoring SPO2 with pulse oximeter and return demonstration;Maintenance of O2 saturations>88%;Exhibits proper breathing techniques, such as pursed lip breathing or other method taught during program session;Compliance with respiratory medication;Demonstrates proper use of MDI's      Comments  Reviewed PLB technique with pt.  Talked about how it work and it's important to maintaining his exercise saturations.  Shayona stated that she does use PLB at home to help control SOB, and has noticed some improvements in SOB. She does not currently have a pulse  oximeter or monitor her oxygen saturations. She was advised to purchase a pulse ox and use it at home.       Goals/Expected Outcomes  Short: Become more profiecient at using PLB.   Long: Become independent at using PLB.  Short: Purchase a pulse oxymeter and start monitoring oxygen saturations at home. Long: Become independent monitoring oxygen saturations and using PLB at home.          Oxygen Discharge (Final Oxygen Re-Evaluation): Oxygen Re-Evaluation - 09/13/17 1105      Program Oxygen Prescription   Program Oxygen Prescription  None      Home Oxygen   Home Oxygen Device  Home Concentrator    Sleep Oxygen Prescription  Continuous    Liters per minute  2    Home Exercise Oxygen Prescription  None    Home at Rest Exercise Oxygen Prescription  None    Compliance with Home Oxygen Use  Yes      Goals/Expected Outcomes   Short Term Goals  To learn and exhibit compliance with exercise, home and travel O2 prescription;To learn and understand importance of maintaining oxygen saturations>88%;To learn and  demonstrate proper use of respiratory medications;To learn and understand importance of monitoring SPO2 with pulse oximeter and demonstrate accurate use of the pulse oximeter.;To learn and demonstrate proper pursed lip breathing techniques or other breathing techniques.    Long  Term Goals  Exhibits compliance with exercise, home and travel O2 prescription;Verbalizes importance of monitoring SPO2 with pulse oximeter and return demonstration;Maintenance of O2 saturations>88%;Exhibits proper breathing techniques, such as pursed lip breathing or other method taught during program session;Compliance with respiratory medication;Demonstrates proper use of MDI's    Comments  Saniyah stated that she does use PLB at home to help control SOB, and has noticed some improvements in SOB. She does not currently have a pulse oximeter or monitor her oxygen saturations. She was advised to purchase a pulse ox and use it at home.     Goals/Expected Outcomes  Short: Purchase a pulse oxymeter and start monitoring oxygen saturations at home. Long: Become independent monitoring oxygen saturations and using PLB at home.        Initial Exercise Prescription: Initial Exercise Prescription - 08/10/17 1300      Date of Initial Exercise RX and Referring Provider   Date  08/10/17    Referring Provider  Raul Del      Treadmill   MPH  2.3    Grade  0.5    Minutes  15    METs  2.92      NuStep   Level  2    SPM  80    Minutes  15    METs  3      REL-XR   Level  2    Speed  50    Minutes  15    METs  3      Biostep-RELP   Level  2    SPM  50    Minutes  15    METs  3      Prescription Details   Frequency (times per week)  3    Duration  Progress to 45 minutes of aerobic exercise without signs/symptoms of physical distress      Intensity   THRR 40-80% of Max Heartrate  116-156    Ratings of Perceived Exertion  11-13    Perceived Dyspnea  0-4      Resistance Training   Training Prescription  Yes  Weight   3 lb    Reps  10-15       Perform Capillary Blood Glucose checks as needed.  Exercise Prescription Changes: Exercise Prescription Changes    Row Name 08/25/17 1100 08/25/17 1300 09/10/17 0800         Response to Exercise   Blood Pressure (Admit)  -  146/84  106/64     Blood Pressure (Exercise)  -  128/64  -     Blood Pressure (Exit)  -  126/84  132/82     Heart Rate (Admit)  -  82 bpm  102 bpm     Heart Rate (Exercise)  -  109 bpm  132 bpm     Heart Rate (Exit)  -  82 bpm  98 bpm     Oxygen Saturation (Admit)  -  99 %  98 %     Oxygen Saturation (Exercise)  -  94 %  94 %     Oxygen Saturation (Exit)  -  97 %  98 %     Rating of Perceived Exertion (Exercise)  -  13  14     Perceived Dyspnea (Exercise)  -  3  3     Symptoms  -  none  none     Duration  -  Continue with 45 min of aerobic exercise without signs/symptoms of physical distress.  Continue with 45 min of aerobic exercise without signs/symptoms of physical distress.     Intensity  -  THRR unchanged  THRR unchanged       Progression   Progression  -  Continue to progress workloads to maintain intensity without signs/symptoms of physical distress.  Continue to progress workloads to maintain intensity without signs/symptoms of physical distress.     Average METs  -  2.7  3.3       Resistance Training   Training Prescription  -  Yes  Yes     Weight  -  3 lb  3 lb     Reps  -  10-15  10-15       Interval Training   Interval Training  -  No  No       Treadmill   MPH  -  2.3  2.3     Grade  -  0.5  0.5     Minutes  -  15  15     METs  -  2.92  2.92       NuStep   Level  -  2  -     SPM  -  80  -     Minutes  -  15  -     METs  -  2.5  -       REL-XR   Level  -  -  5     Speed  -  -  71     Minutes  -  -  15     METs  -  -  3.7       Home Exercise Plan   Plans to continue exercise at  [employer] (comment) [employer] (comment) Gym     Frequency  Add 1 additional day to program  exercise sessions.  -  Add 1 additional day to program exercise sessions.     Initial Home Exercises Provided  08/25/17  -  08/25/17        Exercise Comments: Exercise  Comments    Row Name 08/16/17 1028           Exercise Comments  First full day of exercise!  Patient was oriented to gym and equipment including functions, settings, policies, and procedures.  Patient's individual exercise prescription and treatment plan were reviewed.  All starting workloads were established based on the results of the 6 minute walk test done at initial orientation visit.  The plan for exercise progression was also introduced and progression will be customized based on patient's performance and goals.          Exercise Goals and Review: Exercise Goals    Row Name 08/10/17 1314             Exercise Goals   Increase Physical Activity  Yes       Intervention  Provide advice, education, support and counseling about physical activity/exercise needs.;Develop an individualized exercise prescription for aerobic and resistive training based on initial evaluation findings, risk stratification, comorbidities and participant's personal goals.       Expected Outcomes  Short Term: Attend rehab on a regular basis to increase amount of physical activity.;Long Term: Exercising regularly at least 3-5 days a week.       Increase Strength and Stamina  Yes       Intervention  Provide advice, education, support and counseling about physical activity/exercise needs.;Develop an individualized exercise prescription for aerobic and resistive training based on initial evaluation findings, risk stratification, comorbidities and participant's personal goals.       Expected Outcomes  Short Term: Increase workloads from initial exercise prescription for resistance, speed, and METs.;Long Term: Improve cardiorespiratory fitness, muscular endurance and strength as measured by increased METs and functional capacity (6MWT);Short Term:  Perform resistance training exercises routinely during rehab and add in resistance training at home       Able to understand and use rate of perceived exertion (RPE) scale  Yes       Intervention  Provide education and explanation on how to use RPE scale       Expected Outcomes  Short Term: Able to use RPE daily in rehab to express subjective intensity level;Long Term:  Able to use RPE to guide intensity level when exercising independently       Able to understand and use Dyspnea scale  Yes       Intervention  Provide education and explanation on how to use Dyspnea scale       Expected Outcomes  Short Term: Able to use Dyspnea scale daily in rehab to express subjective sense of shortness of breath during exertion;Long Term: Able to use Dyspnea scale to guide intensity level when exercising independently       Knowledge and understanding of Target Heart Rate Range (THRR)  Yes       Intervention  Provide education and explanation of THRR including how the numbers were predicted and where they are located for reference       Expected Outcomes  Short Term: Able to state/look up THRR;Short Term: Able to use daily as guideline for intensity in rehab;Long Term: Able to use THRR to govern intensity when exercising independently       Able to check pulse independently  Yes       Intervention  Provide education and demonstration on how to check pulse in carotid and radial arteries.;Review the importance of being able to check your own pulse for safety during independent exercise       Expected Outcomes  Short Term: Able to explain why pulse checking is important during independent exercise;Long Term: Able to check pulse independently and accurately       Understanding of Exercise Prescription  Yes       Intervention  Provide education, explanation, and written materials on patient's individual exercise prescription       Expected Outcomes  Short Term: Able to explain program exercise prescription;Long Term:  Able to explain home exercise prescription to exercise independently          Exercise Goals Re-Evaluation : Exercise Goals Re-Evaluation    Row Name 08/16/17 1028 08/25/17 1138 09/10/17 0814         Exercise Goal Re-Evaluation   Exercise Goals Review  Understanding of Exercise Prescription;Able to understand and use Dyspnea scale;Knowledge and understanding of Target Heart Rate Range (THRR);Able to understand and use rate of perceived exertion (RPE) scale  Increase Physical Activity;Increase Strength and Stamina  Increase Physical Activity;Able to understand and use rate of perceived exertion (RPE) scale;Increase Strength and Stamina;Able to understand and use Dyspnea scale     Comments  Reviewed RPE scale, THR and program prescription with pt today.  Pt voiced understanding and was given a copy of goals to take home.   Reviewed home exercise with pt today.  Pt plans to go the gym 1 extra day for exercise.  Reviewed THR, pulse, RPE, sign and symptoms, NTG use, and when to call 911 or MD.  Also discussed weather considerations and indoor options.  Pt voiced understanding.  Pt voiced understanding.  Manna is tolerating exercise well.  She has increased level on the XR and reaches her THR range.  Staff will continue to monitor.     Expected Outcomes  Short: Use RPE daily to regulate intensity.  Long: Follow program prescription in THR.  Short: add 1 extra day of exercise. Long: maintain and add more days to home exercise.  Short - Kielee will contiinue to attend three times per week Long - Farran will increase overall MET level        Discharge Exercise Prescription (Final Exercise Prescription Changes): Exercise Prescription Changes - 09/10/17 0800      Response to Exercise   Blood Pressure (Admit)  106/64    Blood Pressure (Exit)  132/82    Heart Rate (Admit)  102 bpm    Heart Rate (Exercise)  132 bpm    Heart Rate (Exit)  98 bpm    Oxygen Saturation (Admit)  98 %    Oxygen Saturation  (Exercise)  94 %    Oxygen Saturation (Exit)  98 %    Rating of Perceived Exertion (Exercise)  14    Perceived Dyspnea (Exercise)  3    Symptoms  none    Duration  Continue with 45 min of aerobic exercise without signs/symptoms of physical distress.    Intensity  THRR unchanged      Progression   Progression  Continue to progress workloads to maintain intensity without signs/symptoms of physical distress.    Average METs  3.3      Resistance Training   Training Prescription  Yes    Weight  3 lb    Reps  10-15      Interval Training   Interval Training  No      Treadmill   MPH  2.3    Grade  0.5    Minutes  15    METs  2.92      REL-XR   Level  5    Speed  71    Minutes  15    METs  3.7      Home Exercise Plan   Plans to continue exercise at  [facility] (comment) Gym    Frequency  Add 1 additional day to program exercise sessions.    Initial Home Exercises Provided  08/25/17       Nutrition:  Target Goals: Understanding of nutrition guidelines, daily intake of sodium '1500mg'$ , cholesterol '200mg'$ , calories 30% from fat and 7% or less from saturated fats, daily to have 5 or more servings of fruits and vegetables.  Biometrics: Pre Biometrics - 08/10/17 1313      Pre Biometrics   Height  '5\' 8"'$  (1.727 m)    Weight  268 lb 12.8 oz (121.9 kg)    Waist Circumference  42 inches    Hip Circumference  44.5 inches    Waist to Hip Ratio  0.94 %    BMI (Calculated)  40.88        Nutrition Therapy Plan and Nutrition Goals: Nutrition Therapy & Goals - 09/13/17 1059      Intervention Plan   Expected Outcomes  Short Term Goal: Understand basic principles of dietary content, such as calories, fat, sodium, cholesterol and nutrients.;Short Term Goal: A plan has been developed with personal nutrition goals set during dietitian appointment.;Long Term Goal: Adherence to prescribed nutrition plan.       Nutrition Assessments: Nutrition Assessments - 08/10/17 1247       MEDFICTS Scores   Pre Score  46       Nutrition Goals Re-Evaluation: Nutrition Goals Re-Evaluation    Row Name 09/13/17 1100             Goals   Current Weight  269 lb (122 kg)       Nutrition Goal  Look for dietary sources of Vitamin B12 such as egg yolks, mushrooms, nutritional yeast       Comment  Ameli met with the dietician and set initial nutrition goals that she stated she is already following much of.        Expected Outcome  Short Term Goal: Understand basic principles of dietary content, such as calories, fat, sodium, cholesterol and nutrients.;Short Term Goal: A plan has been developed with personal nutrition goals set during dietitian appointment.;Long Term Goal: Adherence to prescribed nutrition plan.;         Personal Goal #2 Re-Evaluation   Personal Goal #2  Work on incorporating a "mini" meal or snack during the first part of the day to help get you on a more regular eating schedule; aim for 200kcal extra per day to start          Nutrition Goals Discharge (Final Nutrition Goals Re-Evaluation): Nutrition Goals Re-Evaluation - 09/13/17 1100      Goals   Current Weight  269 lb (122 kg)    Nutrition Goal  Look for dietary sources of Vitamin B12 such as egg yolks, mushrooms, nutritional yeast    Comment  Cinzia met with the dietician and set initial nutrition goals that she stated she is already following much of.     Expected Outcome  Short Term Goal: Understand basic principles of dietary content, such as calories, fat, sodium, cholesterol and nutrients.;Short Term Goal: A plan has been developed with personal nutrition goals set during dietitian appointment.;Long Term Goal: Adherence to prescribed nutrition plan.;      Personal Goal #2 Re-Evaluation   Personal Goal #2  Work on incorporating a "mini" meal or snack during the first part of the day to help get you on a more regular eating schedule; aim for 200kcal extra per day to start       Psychosocial: Target  Goals: Acknowledge presence or absence of significant depression and/or stress, maximize coping skills, provide positive support system. Participant is able to verbalize types and ability to use techniques and skills needed for reducing stress and depression.   Initial Review & Psychosocial Screening: Initial Psych Review & Screening - 08/10/17 1249      Initial Review   Current issues with  Current Depression;History of Depression;Current Sleep Concerns;Current Stress Concerns    Source of Stress Concerns  Chronic Illness;Family;Transportation;Financial;Unable to perform yard/household activities;Unable to participate in former interests or hobbies    Comments  Currently sleeping only 2-4 hours. Waking up from coughing and not able to immediately go back to sleep. Applied for disability and was denied, now working with a Chief Executive Officer to reapply for disability benefits. Not able to manage any ADL's without needing to stop frequently because so short of breath      Family Dynamics   Good Support System?  Yes      Barriers   Psychosocial barriers to participate in program  There are no identifiable barriers or psychosocial needs.;The patient should benefit from training in stress management and relaxation.      Screening Interventions   Interventions  Encouraged to exercise;Provide feedback about the scores to participant;Program counselor consult;To provide support and resources with identified psychosocial needs    Expected Outcomes  Short Term goal: Utilizing psychosocial counselor, staff and physician to assist with identification of specific Stressors or current issues interfering with healing process. Setting desired goal for each stressor or current issue identified.;Long Term Goal: Stressors or current issues are controlled or eliminated.;Short Term goal: Identification and review with participant of any Quality of Life or Depression concerns found by scoring the questionnaire.;Long Term goal: The  participant improves quality of Life and PHQ9 Scores as seen by post scores and/or verbalization of changes       Quality of Life Scores:  Scores of 19 and below usually indicate a poorer quality of life in these areas.  A difference of  2-3 points is a clinically meaningful difference.  A difference of 2-3 points in the total score of the Quality of Life Index has been associated with significant improvement in overall quality of life, self-image, physical symptoms, and general health in studies assessing change in quality of life.  PHQ-9: Recent Review Flowsheet Data    Depression screen [facility] 2/9 08/10/2017   Decreased Interest 3   Down, Depressed, Hopeless 2   PHQ - 2 Score 5   Altered sleeping 3   Tired, decreased energy 3   Change in appetite 3   Feeling bad or failure about yourself  2   Trouble concentrating 3   Moving slowly or fidgety/restless 3   Suicidal thoughts 0   PHQ-9 Score 22   Difficult doing work/chores Extremely dIfficult     Interpretation of Total Score  Total Score Depression Severity:  1-4 = Minimal depression, 5-9 = Mild depression, 10-14 = Moderate depression, 15-19 = Moderately severe depression, 20-27 = Severe depression   Psychosocial Evaluation and Intervention: Psychosocial Evaluation - 08/18/17 1133      Psychosocial Evaluation & Interventions   Interventions  Encouraged to exercise with the program and follow exercise prescription;Stress management education;Relaxation education;Therapist referral  Comments  Counselor met with Shanquita today for initial psychsocial evaluation.  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  She has a strong support system with family close by and (3) children in the home - (1) who is 66 years old.  Nayeli has struggled with GERD; Sleep Apnea and multiple diagnoses of mood disorders over the years.  She reports sleeping only ~4 hours/ night sitting up with  Oxygen.  Her appetite varies.  She reports having Bipolar in the past and is not on any current medications for this - as she "just moved back to  from [place]" and hasn't put providers in place yet.  Counselor reviewed the PHQ-9 with Tiwanda - scoring "22" which indicates severe depression.  She admits her mood is not stable currently and she has multiple stressors with her health and family.  Counselor provided a list of providers locally for Tracye to contact for treatment for her mental health.  Niaja has goals to lose some weight and hopefully breathe better while in this program.  Counselor and staff will follow with Farhana throughout the course of this program.      Expected Outcomes  Ysabelle will benefit from consistent exercise to achieve her stated goals.  The educational and psychoeducational components will be helpful in understanding her condition and coping more positively.  Yvonda will meet with the dietician for addressing her weight loss goals.  She was given a list of providers to contact for mental health evaluations and treatment and agreed to put this in place.      Continue Psychosocial Services   Follow up required by counselor       Psychosocial Re-Evaluation: Psychosocial Re-Evaluation    [place] Name 09/13/17 1112             Psychosocial Re-Evaluation   Current issues with  Current Depression;Current Stress Concerns       Comments  Freda stated that she has not followed up with any of the mental health care providers given to her by program counselor. She is still sleeping about only 4 hours a night.        Expected Outcomes  Short: Contact a mental health professional to establish care related to her current depression and stress. Long: Be able to sleep better each night and manage mental health wellbeing.        Interventions  Encouraged to attend Pulmonary Rehabilitation for the exercise       Continue Psychosocial Services   Follow up required by counselor           Psychosocial Discharge (Final Psychosocial Re-Evaluation): Psychosocial Re-Evaluation - 09/13/17 1112      Psychosocial Re-Evaluation   Current issues with  Current Depression;Current Stress Concerns    Comments  Margaux stated that she has not followed up with any of the mental health care providers given to her by program counselor. She is still sleeping about only 4 hours a night.     Expected Outcomes  Short: Contact a mental health professional to establish care related to her current depression and stress. Long: Be able to sleep better each night and manage mental health wellbeing.     Interventions  Encouraged to attend Pulmonary Rehabilitation for the exercise    Continue Psychosocial Services   Follow up required by counselor       Education: Education Goals: Education classes will be provided on a weekly  basis, covering required topics. Participant will state understanding/return demonstration of topics presented.  Learning Barriers/Preferences: Learning Barriers/Preferences - 08/10/17 1254      Learning Barriers/Preferences   Learning Barriers  Reading In past year it takes longer to comprehend when reading.    Learning Preferences  Individual Instruction       Education Topics:  Initial Evaluation Education: - Verbal, written and demonstration of respiratory meds, oximetry and breathing techniques. Instruction on use of nebulizers and MDIs and importance of monitoring MDI activations.   Pulmonary Rehab from 09/10/2017 in [facility] Cardiac and Pulmonary Rehab  Date  08/10/17  Educator  SB  Instruction Review Code  1- Verbalizes Understanding      General Nutrition Guidelines/Fats and Fiber: -Group instruction provided by verbal, written material, models and posters to present the general guidelines for heart healthy nutrition. Gives an explanation and review of dietary fats and fiber.   Controlling Sodium/Reading Food Labels: -Group verbal and written material  supporting the discussion of sodium use in heart healthy nutrition. Review and explanation with models, verbal and written materials for utilization of the food label.   Pulmonary Rehab from 09/10/2017 in [facility] Cardiac and Pulmonary Rehab  Date  08/16/17  Educator  PI  Instruction Review Code  1- Verbalizes Understanding      Exercise Physiology & General Exercise Guidelines: - Group verbal and written instruction with models to review the exercise physiology of the cardiovascular system and associated critical values. Provides general exercise guidelines with specific guidelines to those with heart or lung disease.    Aerobic Exercise & Resistance Training: - Gives group verbal and written instruction on the various components of exercise. Focuses on aerobic and resistive training programs and the benefits of this training and how to safely progress through these programs.   Flexibility, Balance, Mind/Body Relaxation: Provides group verbal/written instruction on the benefits of flexibility and balance training, including mind/body exercise modes such as yoga, pilates and tai chi.  Demonstration and skill practice provided.   Pulmonary Rehab from 09/10/2017 in [facility] Cardiac and Pulmonary Rehab  Date  08/25/17  Educator  AS  Instruction Review Code  1- Verbalizes Understanding      Stress and Anxiety: - Provides group verbal and written instruction about the health risks of elevated stress and causes of high stress.  Discuss the correlation between heart/lung disease and anxiety and treatment options. Review healthy ways to manage with stress and anxiety.   Depression: - Provides group verbal and written instruction on the correlation between heart/lung disease and depressed mood, treatment options, and the stigmas associated with seeking treatment.   Pulmonary Rehab from 09/10/2017 in [facility] Cardiac and Pulmonary Rehab  Date  09/01/17  Educator  [facility]  Instruction Review Code  1- Verbalizes  Understanding      Exercise & Equipment Safety: - Individual verbal instruction and demonstration of equipment use and safety with use of the equipment.   Pulmonary Rehab from 09/10/2017 in [facility] Cardiac and Pulmonary Rehab  Date  08/10/17  Educator  Sb  Instruction Review Code  1- Verbalizes Understanding      Infection Prevention: - Provides verbal and written material to individual with discussion of infection control including proper hand washing and proper equipment cleaning during exercise session.   Pulmonary Rehab from 09/10/2017 in [facility] Cardiac and Pulmonary Rehab  Date  08/10/17  Educator  SB  Instruction Review Code  1- Verbalizes Understanding      Falls Prevention: - Provides verbal and written  material to individual with discussion of falls prevention and safety.   Diabetes: - Individual verbal and written instruction to review signs/symptoms of diabetes, desired ranges of glucose level fasting, after meals and with exercise. Advice that pre and post exercise glucose checks will be done for 3 sessions at entry of program.   Chronic Lung Diseases: - Group verbal and written instruction to review updates, respiratory medications, advancements in procedures and treatments. Discuss use of supplemental oxygen including available portable oxygen systems, continuous and intermittent flow rates, concentrators, personal use and safety guidelines. Review proper use of inhaler and spacers. Provide informative websites for self-education.    Energy Conservation: - Provide group verbal and written instruction for methods to conserve energy, plan and organize activities. Instruct on pacing techniques, use of adaptive equipment and posture/positioning to relieve shortness of breath.   Pulmonary Rehab from 09/10/2017 in [facility] Cardiac and Pulmonary Rehab  Date  09/08/17  Educator  [facility]  Instruction Review Code  1- Verbalizes Understanding      Triggers and Exacerbations: - Group verbal  and written instruction to review types of environmental triggers and ways to prevent exacerbations. Discuss weather changes, air quality and the benefits of nasal washing. Review warning signs and symptoms to help prevent infections. Discuss techniques for effective airway clearance, coughing, and vibrations.   AED/CPR: - Group verbal and written instruction with the use of models to demonstrate the basic use of the AED with the basic ABC's of resuscitation.   Pulmonary Rehab from 09/10/2017 in [facility] Cardiac and Pulmonary Rehab  Date  09/10/17  Educator  [facility]  Instruction Review Code  5- Refused Teaching      Anatomy and Physiology of the Lungs: - Group verbal and written instruction with the use of models to provide basic lung anatomy and physiology related to function, structure and complications of lung disease.   Anatomy & Physiology of the Heart: - Group verbal and written instruction and models provide basic cardiac anatomy and physiology, with the coronary electrical and arterial systems. Review of Valvular disease and Heart Failure   Cardiac Medications: - Group verbal and written instruction to review commonly prescribed medications for heart disease. Reviews the medication, class of the drug, and side effects.   Know Your Numbers and Risk Factors: -Group verbal and written instruction about important numbers in your health.  Discussion of what are risk factors and how they play a role in the disease process.  Review of Cholesterol, Blood Pressure, Diabetes, and BMI and the role they play in your overall health.   Sleep Hygiene: -Provides group verbal and written instruction about how sleep can affect your health.  Define sleep hygiene, discuss sleep cycles and impact of sleep habits. Review good sleep hygiene tips.    Other: -Provides group and verbal instruction on various topics (see comments)    Knowledge Questionnaire Score: Knowledge Questionnaire Score - 08/10/17  1255      Knowledge Questionnaire Score   Pre Score  15/18 Reviewed correct responses with Trishna and she verbalized understanding of the responses.  She had no further questions today.        Core Components/Risk Factors/Patient Goals at Admission: Personal Goals and Risk Factors at Admission - 08/10/17 1248      Core Components/Risk Factors/Patient Goals on Admission    Weight Management  Yes;Obesity    Intervention  Weight Management: Develop a combined nutrition and exercise program designed to reach desired caloric intake, while maintaining appropriate intake of nutrient and  fiber, sodium and fats, and appropriate energy expenditure required for the weight goal.;Weight Management/Obesity: Establish reasonable short term and long term weight goals.;Obesity: Provide education and appropriate resources to help participant work on and attain dietary goals.    Admit Weight  268 lb 12.8 oz (121.9 kg)    Goal Weight: Short Term  265 lb (120.2 kg)    Goal Weight: Long Term  180 lb (81.6 kg)    Expected Outcomes  Short Term: Continue to assess and modify interventions until short term weight is achieved;Long Term: Adherence to nutrition and physical activity/exercise program aimed toward attainment of established weight goal;Weight Loss: Understanding of general recommendations for a balanced deficit meal plan, which promotes 1-2 lb weight loss per week and includes a negative energy balance of [phone] kcal/d    Improve shortness of breath with ADL's  Yes    Intervention  Provide education, individualized exercise plan and daily activity instruction to help decrease symptoms of SOB with activities of daily living.    Expected Outcomes  Short Term: Improve cardiorespiratory fitness to achieve a reduction of symptoms when performing ADLs;Long Term: Be able to perform more ADLs without symptoms or delay the onset of symptoms    Hypertension  Yes    Intervention  Provide education on lifestyle  modifcations including regular physical activity/exercise, weight management, moderate sodium restriction and increased consumption of fresh fruit, vegetables, and low fat dairy, alcohol moderation, and smoking cessation.;Monitor prescription use compliance.    Expected Outcomes  Short Term: Continued assessment and intervention until BP is < 140/15m HG in hypertensive participants. < 130/860mHG in hypertensive participants with diabetes, heart failure or chronic kidney disease.;Long Term: Maintenance of blood pressure at goal levels.    Stress  Yes    Intervention  Offer individual and/or small group education and counseling on adjustment to heart disease, stress management and health-related lifestyle change. Teach and support self-help strategies.;Refer participants experiencing significant psychosocial distress to appropriate mental health specialists for further evaluation and treatment. When possible, include family members and significant others in education/counseling sessions.    Expected Outcomes  Short Term: Participant demonstrates changes in health-related behavior, relaxation and other stress management skills, ability to obtain effective social support, and compliance with psychotropic medications if prescribed.;Long Term: Emotional wellbeing is indicated by absence of clinically significant psychosocial distress or social isolation.       Core Components/Risk Factors/Patient Goals Review:  Goals and Risk Factor Review    Row Name 09/13/17 1045             Core Components/Risk Factors/Patient Goals Review   Personal Goals Review  Weight Management/Obesity;Hypertension;Stress;Improve shortness of breath with ADL's       Review  RoDenishiatated that she has noticed some improvements in her SOB during daily activities. Her blood pressure as also reported to be under control with diet and exercise/no meds. She stated stress levels are about the same and her weight has also been mantained.         Expected Outcomes  Short: Continue attending pulmonary rehab regularly and continue to take all meds, short term goal weight 260lbs  Long: Independently continue exercise routine. Long term goal weight 220 lbs.            Core Components/Risk Factors/Patient Goals at Discharge (Final Review):  Goals and Risk Factor Review - 09/13/17 1045      Core Components/Risk Factors/Patient Goals Review   Personal Goals Review  Weight Management/Obesity;Hypertension;Stress;Improve shortness of breath with  ADL's    Review  Tanae stated that she has noticed some improvements in her SOB during daily activities. Her blood pressure as also reported to be under control with diet and exercise/no meds. She stated stress levels are about the same and her weight has also been mantained.     Expected Outcomes  Short: Continue attending pulmonary rehab regularly and continue to take all meds, short term goal weight 260lbs  Long: Independently continue exercise routine. Long term goal weight 220 lbs.         ITP Comments: ITP Comments    Row Name 08/10/17 1235 08/10/17 1301 08/23/17 0921 09/20/17 0828     ITP Comments  Medical review completed today   ITP sent to Dr Loleta Chance for review, changes as needed and signature.  Documentation of diagnosis can be found in [facility] encounter1/14/2019  Analeya is working on weight loss to be eligible to talk to the lung transplant team.   30 day review completed. ITP sent to Dr. Emily Filbert Director of [place]. Continue with ITP unless changes are made by physician.  30 day review completed. ITP sent to Dr. Emily Filbert Director of [place]. Continue with ITP unless changes are made by physician.       Comments: 30 day review

## 2017-09-27 ENCOUNTER — Telehealth: Payer: Self-pay

## 2017-09-27 DIAGNOSIS — J45909 Unspecified asthma, uncomplicated: Secondary | ICD-10-CM

## 2017-09-27 NOTE — Telephone Encounter (Signed)
Left message for Suanne Marker to call back. She has not attended class since 09/17/17.

## 2017-09-27 NOTE — Telephone Encounter (Signed)
Lisa [name] called back to say that she had pneumonia and will try to return on Wednesday.

## 2017-10-06 ENCOUNTER — Telehealth: Payer: Self-pay

## 2017-10-06 NOTE — Telephone Encounter (Signed)
LMOM about returning to [facility]

## 2017-10-08 ENCOUNTER — Encounter: Payer: Medicaid Other | Admitting: *Deleted

## 2017-10-08 DIAGNOSIS — J45909 Unspecified asthma, uncomplicated: Secondary | ICD-10-CM | POA: Diagnosis not present

## 2017-10-08 NOTE — Progress Notes (Signed)
Daily Session Note  Patient Details  Name: Lisa [name] MRN: [id number] Date of Birth: [birth year] Referring Provider:     Pulmonary Rehab from 08/10/2017 in [facility] Cardiac and Pulmonary Rehab  Referring Provider  Raul Del      Encounter Date: 10/08/2017  Check In: Session Check In - 10/08/17 1023      Check-In   Location  [facility]-Cardiac & Pulmonary Rehab    Staff Present  Nyoka Cowden, RN, BSN, MA;Zadie Deemer Sherryll Burger, RN Vickki Hearing, BA, ACSM CEP, Exercise Physiologist    Supervising physician immediately available to respond to emergencies  [facility] immediately available ER MD    Physician(s)  Dr. Jacqualine Code and Reita Cliche    Medication changes reported      No    Fall or balance concerns reported     No    Warm-up and Cool-down  Performed as group-led instruction    Resistance Training Performed  Yes    VAD Patient?  No      Pain Assessment   Currently in Pain?  No/denies          Social History   Tobacco Use  Smoking Status Never Smoker  Smokeless Tobacco Never Used    Goals Met:  Proper associated with RPD/PD & O2 Sat Independence with exercise equipment Using PLB without cueing & demonstrates good technique Exercise tolerated well Strength training completed today  Goals Unmet:  Not Applicable  Comments: Pt able to follow exercise prescription today without complaint.  Will continue to monitor for progression.    Dr. Emily Filbert is Medical Director for [place] and [facility] Pulmonary Rehabilitation.

## 2017-10-11 ENCOUNTER — Encounter: Payer: Medicaid Other | Attending: Specialist | Admitting: *Deleted

## 2017-10-11 DIAGNOSIS — J45909 Unspecified asthma, uncomplicated: Secondary | ICD-10-CM | POA: Diagnosis not present

## 2017-10-11 NOTE — Progress Notes (Signed)
Daily Session Note  Patient Details  Name: Lisa [name] MRN: [id number] Date of Birth: 02-19-1971 Referring Provider:     Pulmonary Rehab from 08/10/2017 in [facility] Cardiac and Pulmonary Rehab  Referring Provider  Raul Del      Encounter Date: 10/11/2017  Check In: Session Check In - 10/11/17 1025      Check-In   Location  [facility]-Cardiac & Pulmonary Rehab    Staff Present  Earlean Shawl, BS, ACSM CEP, Exercise Physiologist;Amanda Oletta Darter, BA, ACSM CEP, Exercise Physiologist;Joseph Flavia Shipper    Supervising physician immediately available to respond to emergencies  [facility] immediately available ER MD    Physician(s)  Drs. Kinner and Jasper     Medication changes reported      No    Fall or balance concerns reported     No    Warm-up and Cool-down  Performed as group-led Higher education careers adviser Performed  Yes    VAD Patient?  No      Pain Assessment   Currently in Pain?  No/denies    Multiple Pain Sites  No          Social History   Tobacco Use  Smoking Status Never Smoker  Smokeless Tobacco Never Used    Goals Met:  Proper associated with RPD/PD & O2 Sat [place] with exercise equipment Exercise tolerated well No report of cardiac concerns or symptoms Strength training completed today  Personal goals reviewed Goals Unmet:  Not Applicable  Comments: Pt able to follow exercise prescription today without complaint.  Will continue to monitor for progression.    Dr. Emily Filbert is Medical Director for [place] and [facility] Pulmonary Rehabilitation.

## 2017-10-15 ENCOUNTER — Encounter: Payer: Medicaid Other | Admitting: *Deleted

## 2017-10-15 DIAGNOSIS — J45909 Unspecified asthma, uncomplicated: Secondary | ICD-10-CM

## 2017-10-15 NOTE — Progress Notes (Signed)
Daily Session Note  Patient Details  Name: Lisa [name] MRN: [id number] Date of Birth: 04/11/1971 Referring Provider:     Pulmonary Rehab from 08/10/2017 in [facility] Cardiac and Pulmonary Rehab  Referring Provider  Raul Del      Encounter Date: 10/15/2017  Check In: Session Check In - 10/15/17 1059      Check-In   Location  [facility]-Cardiac & Pulmonary Rehab    Staff Present  Joellyn Rued, BS, Olga Coaster, RN BSN;Mary Kellie Shropshire, RN, BSN, MA    Supervising physician immediately available to respond to emergencies  [facility] immediately available ER MD    Physician(s)  Drs. Mariea Clonts and Rifenbark    Medication changes reported      No    Fall or balance concerns reported     No    Tobacco Cessation  No Change    Warm-up and Cool-down  Performed as group-led instruction    Resistance Training Performed  Yes    VAD Patient?  No      Pain Assessment   Currently in Pain?  No/denies    Pain Score  0-No pain    Multiple Pain Sites  No          Social History   Tobacco Use  Smoking Status Never Smoker  Smokeless Tobacco Never Used    Goals Met:  Proper associated with RPD/PD & O2 Sat [place] with exercise equipment Using PLB without cueing & demonstrates good technique Exercise tolerated well Strength training completed today  Goals Unmet:  Not Applicable  Comments: Pt able to follow exercise prescription today without complaint.  Will continue to monitor for progression.    Dr. Emily Filbert is Medical Director for [place] and [facility] Pulmonary Rehabilitation.

## 2017-10-18 DIAGNOSIS — J45909 Unspecified asthma, uncomplicated: Secondary | ICD-10-CM

## 2017-10-18 NOTE — Progress Notes (Signed)
Pulmonary Individual Treatment Plan  Patient Details  Name: Lisa [name] MRN: [id number] Date of Birth: 04/02/1971 Referring Provider:     Pulmonary Rehab from 08/10/2017 in [facility] Cardiac and Pulmonary Rehab  Referring Provider  Raul Del      Initial Encounter Date:    Pulmonary Rehab from 08/10/2017 in [facility] Cardiac and Pulmonary Rehab  Date  08/10/17  Referring Provider  Raul Del      Visit Diagnosis: Uncomplicated asthma, unspecified asthma severity, unspecified whether persistent  Patient's Home Medications on Admission:  Current Outpatient Medications:  .  albuterol (PROVENTIL HFA;VENTOLIN HFA) 108 (90 Base) MCG/ACT inhaler, Inhale 2 puffs into the lungs every 6 (six) hours as needed for wheezing or shortness of breath., Disp: 1 Inhaler, Rfl: 2 .  azaTHIOprine (IMURAN) 50 MG tablet, Take by mouth., Disp: , Rfl:  .  ferrous sulfate 325 (65 FE) MG tablet, Take 325 mg by mouth daily with breakfast., Disp: , Rfl:  .  gabapentin (NEURONTIN) 100 MG capsule, Take by mouth., Disp: , Rfl:  .  Ipratropium-Albuterol (COMBIVENT) 20-100 MCG/ACT AERS respimat, Inhale 1 puff into the lungs every 6 (six) hours., Disp: , Rfl:  .  montelukast (SINGULAIR) 10 MG tablet, Take by mouth., Disp: , Rfl:  .  omeprazole (PRILOSEC) 40 MG capsule, Take by mouth., Disp: , Rfl:  .  phentermine 37.5 MG capsule, Take by mouth., Disp: , Rfl:  .  predniSONE (DELTASONE) 10 MG tablet, Take 20 mg by mouth daily., Disp: , Rfl:   Past Medical History: Past Medical History:  Diagnosis Date  . Abdominal pain   . Anemia    hx of anemic  . Asthma   . Bipolar 1 disorder ([place])   . Blood in stool   . Change in voice   . Chest pain   . Constipation   . Depression   . Diverticulitis   . DVT (deep venous thrombosis) ([place]) 12/13/2014  . Dysrhythmia   . Fatigue   . Gallstones 04/10/2011   with biliary pancreatitis  . GERD (gastroesophageal reflux disease)   . Migraines   . MRSA carrier   . Nausea   .  Pancreatitis   . PTSD (post-traumatic stress disorder)   . Pulmonary embolism ([place]) 12/13/2014  . Sleep apnea    cpap  . Trouble swallowing     Tobacco Use: Social History   Tobacco Use  Smoking Status Never Smoker  Smokeless Tobacco Never Used    Labs: Recent Review Scientist, physiological    Labs for ITP Cardiac and Pulmonary Rehab Latest Ref Rng & Units 03/18/2014   Hemoglobin A1c 4.2 - 6.3 % 5.6       Pulmonary Assessment Scores: Pulmonary Assessment Scores    Row Name 08/10/17 1257         ADL UCSD   ADL Phase  Entry     SOB Score total  98     Rest  2     Walk  4     Stairs  5     Bath  4     Dress  3     Shop  4       CAT Score   CAT Score  39       mMRC Score   mMRC Score  2        Pulmonary Function Assessment: Pulmonary Function Assessment - 08/10/17 1256      Breath   Shortness of Breath  Fear of Shortness of Breath;Yes;Limiting activity  Exercise Target Goals:    Exercise Program Goal: Individual exercise prescription set using results from initial 6 min walk test and THRR while considering  patient's activity barriers and safety.    Exercise Prescription Goal: Initial exercise prescription builds to 30-45 minutes a day of aerobic activity, 2-3 days per week.  Home exercise guidelines will be given to patient during program as part of exercise prescription that the participant will acknowledge.  Activity Barriers & Risk Stratification: Activity Barriers & Cardiac Risk Stratification - 08/10/17 1247      Activity Barriers & Cardiac Risk Stratification   Activity Barriers  Back Problems;Shortness of Breath       6 Minute Walk: 6 Minute Walk    Row Name 08/10/17 1318         6 Minute Walk   Phase  Initial     Distance  1250 feet     Walk Time  6 minutes     # of Rest Breaks  0     MPH  2.37     METS  4.13     RPE  19     Perceived Dyspnea   4     VO2 Peak  14.47     Symptoms  Yes (comment)     Comments  back pain 7/10      Resting HR  77 bpm     Resting BP  126/76     Resting Oxygen Saturation   98 %     Exercise Oxygen Saturation  during 6 min walk  91 %     Max Ex. HR  138 bpm     Max Ex. BP  162/86     2 Minute Post BP  138/84       Interval HR   1 Minute HR  112     2 Minute HR  133     3 Minute HR  136     4 Minute HR  134     5 Minute HR  138     6 Minute HR  115     2 Minute Post HR  91     Interval Heart Rate?  Yes       Interval Oxygen   Interval Oxygen?  Yes     Baseline Oxygen Saturation %  98 %     1 Minute Liters of Oxygen  0 L     2 Minute Oxygen Saturation %  91 %     2 Minute Liters of Oxygen  0 L     3 Minute Oxygen Saturation %  94 %     3 Minute Liters of Oxygen  0 L     4 Minute Oxygen Saturation %  95 %     4 Minute Liters of Oxygen  0 L     5 Minute Oxygen Saturation %  94 %     5 Minute Liters of Oxygen  0 L     6 Minute Oxygen Saturation %  95 %     6 Minute Liters of Oxygen  0 L     2 Minute Post Oxygen Saturation %  100 %     2 Minute Post Liters of Oxygen  0 L       Oxygen Initial Assessment: Oxygen Initial Assessment - 08/10/17 1238      Home Oxygen   Home Oxygen Device  Home Concentrator    Sleep Oxygen Prescription  Continuous;CPAP  Liters per minute  2    Home Exercise Oxygen Prescription  None    Home at Rest Exercise Oxygen Prescription  None    Compliance with Home Oxygen Use  Yes      Initial 6 min Walk   Oxygen Used  None      Program Oxygen Prescription   Program Oxygen Prescription  None      Intervention   Short Term Goals  To learn and exhibit compliance with exercise, home and travel O2 prescription;To learn and understand importance of maintaining oxygen saturations>88%;To learn and demonstrate proper use of respiratory medications;To learn and understand importance of monitoring SPO2 with pulse oximeter and demonstrate accurate use of the pulse oximeter.;To learn and demonstrate proper pursed lip breathing techniques or other breathing  techniques.    Long  Term Goals  Exhibits compliance with exercise, home and travel O2 prescription;Verbalizes importance of monitoring SPO2 with pulse oximeter and return demonstration;Maintenance of O2 saturations>88%;Exhibits proper breathing techniques, such as pursed lip breathing or other method taught during program session;Compliance with respiratory medication;Demonstrates proper use of MDI's       Oxygen Re-Evaluation: Oxygen Re-Evaluation    Row Name 08/16/17 1029 09/13/17 1105 10/11/17 1051         Program Oxygen Prescription   Program Oxygen Prescription  -  None  None       Home Oxygen   Home Oxygen Device  -  Home Concentrator  Home Concentrator     Sleep Oxygen Prescription  -  Continuous  Continuous     Liters per minute  -  2  2     Home Exercise Oxygen Prescription  -  None  None     Home at Rest Exercise Oxygen Prescription  -  None  None     Compliance with Home Oxygen Use  -  Yes  Yes       Goals/Expected Outcomes   Short Term Goals  To learn and exhibit compliance with exercise, home and travel O2 prescription;To learn and understand importance of maintaining oxygen saturations>88%;To learn and demonstrate proper use of respiratory medications;To learn and understand importance of monitoring SPO2 with pulse oximeter and demonstrate accurate use of the pulse oximeter.;To learn and demonstrate proper pursed lip breathing techniques or other breathing techniques.  To learn and exhibit compliance with exercise, home and travel O2 prescription;To learn and understand importance of maintaining oxygen saturations>88%;To learn and demonstrate proper use of respiratory medications;To learn and understand importance of monitoring SPO2 with pulse oximeter and demonstrate accurate use of the pulse oximeter.;To learn and demonstrate proper pursed lip breathing techniques or other breathing techniques.  To learn and exhibit compliance with exercise, home and travel O2  prescription;To learn and understand importance of maintaining oxygen saturations>88%;To learn and demonstrate proper use of respiratory medications;To learn and understand importance of monitoring SPO2 with pulse oximeter and demonstrate accurate use of the pulse oximeter.;To learn and demonstrate proper pursed lip breathing techniques or other breathing techniques.     Long  Term Goals  Exhibits compliance with exercise, home and travel O2 prescription;Verbalizes importance of monitoring SPO2 with pulse oximeter and return demonstration;Maintenance of O2 saturations>88%;Exhibits proper breathing techniques, such as pursed lip breathing or other method taught during program session;Compliance with respiratory medication;Demonstrates proper use of MDI's  Exhibits compliance with exercise, home and travel O2 prescription;Verbalizes importance of monitoring SPO2 with pulse oximeter and return demonstration;Maintenance of O2 saturations>88%;Exhibits proper breathing techniques, such as pursed lip breathing or other  method taught during program session;Compliance with respiratory medication;Demonstrates proper use of MDI's  Exhibits compliance with exercise, home and travel O2 prescription;Verbalizes importance of monitoring SPO2 with pulse oximeter and return demonstration;Maintenance of O2 saturations>88%;Exhibits proper breathing techniques, such as pursed lip breathing or other method taught during program session;Compliance with respiratory medication;Demonstrates proper use of MDI's     Comments  Reviewed PLB technique with pt.  Talked about how it work and it's important to maintaining his exercise saturations.  Erinn stated that she does use PLB at home to help control SOB, and has noticed some improvements in SOB. She does not currently have a pulse oximeter or monitor her oxygen saturations. She was advised to purchase a pulse ox and use it at home.   Luwanna had been out sick and her SOB was worse duing  her sickness, but has now improved since getting better and back to class. She did not yet buy a pulse oximeter to monitor her oxygen at home but was encouraged to do so again.      Goals/Expected Outcomes  Short: Become more profiecient at using PLB.   Long: Become independent at using PLB.  Short: Purchase a pulse oxymeter and start monitoring oxygen saturations at home. Long: Become independent monitoring oxygen saturations and using PLB at home.   Short: Purchase a pulse oxymeter and start monitoring oxygen saturations at home. Long: Become independent monitoring oxygen saturations and using PLB at home.         Oxygen Discharge (Final Oxygen Re-Evaluation): Oxygen Re-Evaluation - 10/11/17 1051      Program Oxygen Prescription   Program Oxygen Prescription  None      Home Oxygen   Home Oxygen Device  Home Concentrator    Sleep Oxygen Prescription  Continuous    Liters per minute  2    Home Exercise Oxygen Prescription  None    Home at Rest Exercise Oxygen Prescription  None    Compliance with Home Oxygen Use  Yes      Goals/Expected Outcomes   Short Term Goals  To learn and exhibit compliance with exercise, home and travel O2 prescription;To learn and understand importance of maintaining oxygen saturations>88%;To learn and demonstrate proper use of respiratory medications;To learn and understand importance of monitoring SPO2 with pulse oximeter and demonstrate accurate use of the pulse oximeter.;To learn and demonstrate proper pursed lip breathing techniques or other breathing techniques.    Long  Term Goals  Exhibits compliance with exercise, home and travel O2 prescription;Verbalizes importance of monitoring SPO2 with pulse oximeter and return demonstration;Maintenance of O2 saturations>88%;Exhibits proper breathing techniques, such as pursed lip breathing or other method taught during program session;Compliance with respiratory medication;Demonstrates proper use of MDI's    Comments   Zareah had been out sick and her SOB was worse duing her sickness, but has now improved since getting better and back to class. She did not yet buy a pulse oximeter to monitor her oxygen at home but was encouraged to do so again.     Goals/Expected Outcomes  Short: Purchase a pulse oxymeter and start monitoring oxygen saturations at home. Long: Become independent monitoring oxygen saturations and using PLB at home.        Initial Exercise Prescription: Initial Exercise Prescription - 08/10/17 1300      Date of Initial Exercise RX and Referring Provider   Date  08/10/17    Referring Provider  Raul Del      Treadmill   MPH  2.3  Grade  0.5    Minutes  15    METs  2.92      NuStep   Level  2    SPM  80    Minutes  15    METs  3      REL-XR   Level  2    Speed  50    Minutes  15    METs  3      Biostep-RELP   Level  2    SPM  50    Minutes  15    METs  3      Prescription Details   Frequency (times per week)  3    Duration  Progress to 45 minutes of aerobic exercise without signs/symptoms of physical distress      Intensity   THRR 40-80% of Max Heartrate  116-156    Ratings of Perceived Exertion  11-13    Perceived Dyspnea  0-4      Resistance Training   Training Prescription  Yes    Weight  3 lb    Reps  10-15       Perform Capillary Blood Glucose checks as needed.  Exercise Prescription Changes: Exercise Prescription Changes    Row Name 08/25/17 1100 08/25/17 1300 09/10/17 0800         Response to Exercise   Blood Pressure (Admit)  -  146/84  106/64     Blood Pressure (Exercise)  -  128/64  -     Blood Pressure (Exit)  -  126/84  132/82     Heart Rate (Admit)  -  82 bpm  102 bpm     Heart Rate (Exercise)  -  109 bpm  132 bpm     Heart Rate (Exit)  -  82 bpm  98 bpm     Oxygen Saturation (Admit)  -  99 %  98 %     Oxygen Saturation (Exercise)  -  94 %  94 %     Oxygen Saturation (Exit)  -  97 %  98 %     Rating of Perceived Exertion (Exercise)  -  13   14     Perceived Dyspnea (Exercise)  -  3  3     Symptoms  -  none  none     Duration  -  Continue with 45 min of aerobic exercise without signs/symptoms of physical distress.  Continue with 45 min of aerobic exercise without signs/symptoms of physical distress.     Intensity  -  THRR unchanged  THRR unchanged       Progression   Progression  -  Continue to progress workloads to maintain intensity without signs/symptoms of physical distress.  Continue to progress workloads to maintain intensity without signs/symptoms of physical distress.     Average METs  -  2.7  3.3       Resistance Training   Training Prescription  -  Yes  Yes     Weight  -  3 lb  3 lb     Reps  -  10-15  10-15       Interval Training   Interval Training  -  No  No       Treadmill   MPH  -  2.3  2.3     Grade  -  0.5  0.5     Minutes  -  15  15     METs  -  2.92  2.92       NuStep   Level  -  2  -     SPM  -  80  -     Minutes  -  15  -     METs  -  2.5  -       REL-XR   Level  -  -  5     Speed  -  -  71     Minutes  -  -  15     METs  -  -  3.7       Home Exercise Plan   Plans to continue exercise at  [employer] (comment) [employer] (comment) Gym     Frequency  Add 1 additional day to program exercise sessions.  -  Add 1 additional day to program exercise sessions.     Initial Home Exercises Provided  08/25/17  -  08/25/17        Exercise Comments: Exercise Comments    Row Name 08/16/17 1028           Exercise Comments  First full day of exercise!  Patient was oriented to gym and equipment including functions, settings, policies, and procedures.  Patient's individual exercise prescription and treatment plan were reviewed.  All starting workloads were established based on the results of the 6 minute walk test done at initial orientation visit.  The plan for exercise progression was also introduced and progression will be customized based on patient's performance and  goals.          Exercise Goals and Review: Exercise Goals    Row Name 08/10/17 1314             Exercise Goals   Increase Physical Activity  Yes       Intervention  Provide advice, education, support and counseling about physical activity/exercise needs.;Develop an individualized exercise prescription for aerobic and resistive training based on initial evaluation findings, risk stratification, comorbidities and participant's personal goals.       Expected Outcomes  Short Term: Attend rehab on a regular basis to increase amount of physical activity.;Long Term: Exercising regularly at least 3-5 days a week.       Increase Strength and Stamina  Yes       Intervention  Provide advice, education, support and counseling about physical activity/exercise needs.;Develop an individualized exercise prescription for aerobic and resistive training based on initial evaluation findings, risk stratification, comorbidities and participant's personal goals.       Expected Outcomes  Short Term: Increase workloads from initial exercise prescription for resistance, speed, and METs.;Long Term: Improve cardiorespiratory fitness, muscular endurance and strength as measured by increased METs and functional capacity (6MWT);Short Term: Perform resistance training exercises routinely during rehab and add in resistance training at home       Able to understand and use rate of perceived exertion (RPE) scale  Yes       Intervention  Provide education and explanation on how to use RPE scale       Expected Outcomes  Short Term: Able to use RPE daily in rehab to express subjective intensity level;Long Term:  Able to use RPE to guide intensity level when exercising independently       Able to understand and use Dyspnea scale  Yes       Intervention  Provide education and explanation on how to use Dyspnea scale  Expected Outcomes  Short Term: Able to use Dyspnea scale daily in rehab to express subjective sense of shortness  of breath during exertion;Long Term: Able to use Dyspnea scale to guide intensity level when exercising independently       Knowledge and understanding of Target Heart Rate Range (THRR)  Yes       Intervention  Provide education and explanation of THRR including how the numbers were predicted and where they are located for reference       Expected Outcomes  Short Term: Able to state/look up THRR;Short Term: Able to use daily as guideline for intensity in rehab;Long Term: Able to use THRR to govern intensity when exercising independently       Able to check pulse independently  Yes       Intervention  Provide education and demonstration on how to check pulse in carotid and radial arteries.;Review the importance of being able to check your own pulse for safety during independent exercise       Expected Outcomes  Short Term: Able to explain why pulse checking is important during independent exercise;Long Term: Able to check pulse independently and accurately       Understanding of Exercise Prescription  Yes       Intervention  Provide education, explanation, and written materials on patient's individual exercise prescription       Expected Outcomes  Short Term: Able to explain program exercise prescription;Long Term: Able to explain home exercise prescription to exercise independently          Exercise Goals Re-Evaluation : Exercise Goals Re-Evaluation    Row Name 08/16/17 1028 08/25/17 1138 09/10/17 0814 10/11/17 1103       Exercise Goal Re-Evaluation   Exercise Goals Review  Understanding of Exercise Prescription;Able to understand and use Dyspnea scale;Knowledge and understanding of Target Heart Rate Range (THRR);Able to understand and use rate of perceived exertion (RPE) scale  Increase Physical Activity;Increase Strength and Stamina  Increase Physical Activity;Able to understand and use rate of perceived exertion (RPE) scale;Increase Strength and Stamina;Able to understand and use Dyspnea scale   Increase Physical Activity;Able to understand and use rate of perceived exertion (RPE) scale;Increase Strength and Stamina;Able to understand and use Dyspnea scale    Comments  Reviewed RPE scale, THR and program prescription with pt today.  Pt voiced understanding and was given a copy of goals to take home.   Reviewed home exercise with pt today.  Pt plans to go the gym 1 extra day for exercise.  Reviewed THR, pulse, RPE, sign and symptoms, NTG use, and when to call 911 or MD.  Also discussed weather considerations and indoor options.  Pt voiced understanding.  Pt voiced understanding.  Alizon is tolerating exercise well.  She has increased level on the XR and reaches her THR range.  Staff will continue to monitor.  Has been out sick and just returned to class. Working back up to previous workolads.     Expected Outcomes  Short: Use RPE daily to regulate intensity.  Long: Follow program prescription in THR.  Short: add 1 extra day of exercise. Long: maintain and add more days to home exercise.  Short - Diantha will contiinue to attend three times per week Long - Wateen will increase overall MET level  Short: Return to previous workloads prior to being sick and become consistant with attendance since she is no longer sick. Long: Sanyla will increase overall MET level and be consistant with her exercise to help  in weight loss.        Discharge Exercise Prescription (Final Exercise Prescription Changes): Exercise Prescription Changes - 09/10/17 0800      Response to Exercise   Blood Pressure (Admit)  106/64    Blood Pressure (Exit)  132/82    Heart Rate (Admit)  102 bpm    Heart Rate (Exercise)  132 bpm    Heart Rate (Exit)  98 bpm    Oxygen Saturation (Admit)  98 %    Oxygen Saturation (Exercise)  94 %    Oxygen Saturation (Exit)  98 %    Rating of Perceived Exertion (Exercise)  14    Perceived Dyspnea (Exercise)  3    Symptoms  none    Duration  Continue with 45 min of aerobic exercise without  signs/symptoms of physical distress.    Intensity  THRR unchanged      Progression   Progression  Continue to progress workloads to maintain intensity without signs/symptoms of physical distress.    Average METs  3.3      Resistance Training   Training Prescription  Yes    Weight  3 lb    Reps  10-15      Interval Training   Interval Training  No      Treadmill   MPH  2.3    Grade  0.5    Minutes  15    METs  2.92      REL-XR   Level  5    Speed  71    Minutes  15    METs  3.7      Home Exercise Plan   Plans to continue exercise at  [employer] (comment) Gym    Frequency  Add 1 additional day to program exercise sessions.    Initial Home Exercises Provided  08/25/17       Nutrition:  Target Goals: Understanding of nutrition guidelines, daily intake of sodium '1500mg'$ , cholesterol '200mg'$ , calories 30% from fat and 7% or less from saturated fats, daily to have 5 or more servings of fruits and vegetables.  Biometrics: Pre Biometrics - 08/10/17 1313      Pre Biometrics   Height  '5\' 8"'$  (1.727 m)    Weight  268 lb 12.8 oz (121.9 kg)    Waist Circumference  42 inches    Hip Circumference  44.5 inches    Waist to Hip Ratio  0.94 %    BMI (Calculated)  40.88        Nutrition Therapy Plan and Nutrition Goals: Nutrition Therapy & Goals - 09/13/17 1059      Intervention Plan   Expected Outcomes  Short Term Goal: Understand basic principles of dietary content, such as calories, fat, sodium, cholesterol and nutrients.;Short Term Goal: A plan has been developed with personal nutrition goals set during dietitian appointment.;Long Term Goal: Adherence to prescribed nutrition plan.       Nutrition Assessments: Nutrition Assessments - 08/10/17 1247      MEDFICTS Scores   Pre Score  46       Nutrition Goals Re-Evaluation: Nutrition Goals Re-Evaluation    Row Name 09/13/17 1100 10/11/17 1056           Goals   Current Weight  269 lb (122 kg)  272 lb (123.4  kg)      Nutrition Goal  Look for dietary sources of Vitamin B12 such as egg yolks, mushrooms, nutritional yeast  -      Comment  Moniqua met with the dietician and set initial nutrition goals that she stated she is already following much of.   Alayjah stated that she is eating when she feels like it and is trying to follow healthy eating guidelines.       Expected Outcome  Short Term Goal: Understand basic principles of dietary content, such as calories, fat, sodium, cholesterol and nutrients.;Short Term Goal: A plan has been developed with personal nutrition goals set during dietitian appointment.;Long Term Goal: Adherence to prescribed nutrition plan.;  -        Personal Goal #2 Re-Evaluation   Personal Goal #2  Work on incorporating a "mini" meal or snack during the first part of the day to help get you on a more regular eating schedule; aim for 200kcal extra per day to start  Short: get into consistance healthy eating pattern.  weight goal of 260 lbs  Long: Goal weight 220 lbs.          Nutrition Goals Discharge (Final Nutrition Goals Re-Evaluation): Nutrition Goals Re-Evaluation - 10/11/17 1056      Goals   Current Weight  272 lb (123.4 kg)    Comment  Hisae stated that she is eating when she feels like it and is trying to follow healthy eating guidelines.       Personal Goal #2 Re-Evaluation   Personal Goal #2  Short: get into consistance healthy eating pattern.  weight goal of 260 lbs  Long: Goal weight 220 lbs.        Psychosocial: Target Goals: Acknowledge presence or absence of significant depression and/or stress, maximize coping skills, provide positive support system. Participant is able to verbalize types and ability to use techniques and skills needed for reducing stress and depression.   Initial Review & Psychosocial Screening: Initial Psych Review & Screening - 08/10/17 1249      Initial Review   Current issues with  Current Depression;History of Depression;Current Sleep  Concerns;Current Stress Concerns    Source of Stress Concerns  Chronic Illness;Family;Transportation;Financial;Unable to perform yard/household activities;Unable to participate in former interests or hobbies    Comments  Currently sleeping only 2-4 hours. Waking up from coughing and not able to immediately go back to sleep. Applied for disability and was denied, now working with a Chief Executive Officer to reapply for disability benefits. Not able to manage any ADL's without needing to stop frequently because so short of breath      Family Dynamics   Good Support System?  Yes      Barriers   Psychosocial barriers to participate in program  There are no identifiable barriers or psychosocial needs.;The patient should benefit from training in stress management and relaxation.      Screening Interventions   Interventions  Encouraged to exercise;Provide feedback about the scores to participant;Program counselor consult;To provide support and resources with identified psychosocial needs    Expected Outcomes  Short Term goal: Utilizing psychosocial counselor, staff and physician to assist with identification of specific Stressors or current issues interfering with healing process. Setting desired goal for each stressor or current issue identified.;Long Term Goal: Stressors or current issues are controlled or eliminated.;Short Term goal: Identification and review with participant of any Quality of Life or Depression concerns found by scoring the questionnaire.;Long Term goal: The participant improves quality of Life and PHQ9 Scores as seen by post scores and/or verbalization of changes       Quality of Life Scores:  Scores of 19 and below usually indicate a poorer  quality of life in these areas.  A difference of  2-3 points is a clinically meaningful difference.  A difference of 2-3 points in the total score of the Quality of Life Index has been associated with significant improvement in overall quality of life,  self-image, physical symptoms, and general health in studies assessing change in quality of life.  PHQ-9: Recent Review Flowsheet Data    Depression screen [facility] 2/9 08/10/2017   Decreased Interest 3   Down, Depressed, Hopeless 2   PHQ - 2 Score 5   Altered sleeping 3   Tired, decreased energy 3   Change in appetite 3   Feeling bad or failure about yourself  2   Trouble concentrating 3   Moving slowly or fidgety/restless 3   Suicidal thoughts 0   PHQ-9 Score 22   Difficult doing work/chores Extremely dIfficult     Interpretation of Total Score  Total Score Depression Severity:  1-4 = Minimal depression, 5-9 = Mild depression, 10-14 = Moderate depression, 15-19 = Moderately severe depression, 20-27 = Severe depression   Psychosocial Evaluation and Intervention: Psychosocial Evaluation - 08/18/17 1133      Psychosocial Evaluation & Interventions   Interventions  Encouraged to exercise with the program and follow exercise prescription;Stress management education;Relaxation education;Therapist referral    Comments  Counselor met with Anaisha today for initial psychsocial evaluation.  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  She has a strong support system with family close by and (3) children in the home - (1) who is 35 years old.  Aviyana has struggled with GERD; Sleep Apnea and multiple diagnoses of mood disorders over the years.  She reports sleeping only ~4 hours/ night sitting up with Oxygen.  Her appetite varies.  She reports having Bipolar in the past and is not on any current medications for this - as she "just moved back to  from [place]" and hasn't put providers in place yet.  Counselor reviewed the PHQ-9 with Kidada - scoring "22" which indicates severe depression.  She admits her mood is not stable currently and she has multiple stressors with her health and family.  Counselor provided a list of providers locally for  Felina to contact for treatment for her mental health.  Charizma has goals to lose some weight and hopefully breathe better while in this program.  Counselor and staff will follow with Jillisa throughout the course of this program.      Expected Outcomes  Jalayia will benefit from consistent exercise to achieve her stated goals.  The educational and psychoeducational components will be helpful in understanding her condition and coping more positively.  Boneta will meet with the dietician for addressing her weight loss goals.  She was given a list of providers to contact for mental health evaluations and treatment and agreed to put this in place.      Continue Psychosocial Services   Follow up required by counselor       Psychosocial Re-Evaluation: Psychosocial Re-Evaluation    [place] Name 09/13/17 1112 10/08/17 1113 10/11/17 1031         Psychosocial Re-Evaluation   Current issues with  Current Depression;Current Stress Concerns  -  Current Depression;History of Depression;Current Sleep Concerns;Current Stress Concerns     Comments  Akaila stated that she has not followed up with any of the mental health care providers given to her by program counselor. She is still  sleeping about only 4 hours a night.   Daysie returned after being absent most of the month. She was in good spirits and reported she was happy to be back. She said her time off was needed, but that she plans on coming next week as well.   Counselor follow up with Stevana today who has been out for awhile - reporting she had Pneumonia.  She continues to report sleep problems and states her mood is "about the same."  When asked if she had contacted any of the providers on the list this counselor gave to her in February, she stated she had not.  When asked if Bay would be willing to see a Dr. about medications for her mood; as she reported a history of Bi-polar Disorder, Elynn refused - stating - she "takes anough pills already."  She appears to isolate in  class and wears her earbuds to listen to music rather than socializing with others.  Layia also appears irritated to speak to counselor or other staff in this program -more than is typical; which makes it difficult to follow with her on goal setting or progress made.  When asked how she is coping with ongoing stressors at this time; Liberia responded - "I just keep moving."  counselor attempted to educate Ashlinn on need to treat the whole person and how much her mood can impact the rest of her health - she appeared disinterested - and also reports she is not in favor or a "lung transplant" and will just keep doing what she is doing.  Counselor made staff aware of this and Chanti's refusal for mental health treatment at this time.       Expected Outcomes  Short: Contact a mental health professional to establish care related to her current depression and stress. Long: Be able to sleep better each night and manage mental health wellbeing.   short: continue coming to [facility]. Long: graduate from [employer].   Deyna refuses mental health recommendations and/or treatment at this time - although she would benefit from it greatly.       Interventions  Encouraged to attend Pulmonary Rehabilitation for the exercise  -  -     Continue Psychosocial Services   Follow up required by counselor  -  -        Psychosocial Discharge (Final Psychosocial Re-Evaluation): Psychosocial Re-Evaluation - 10/11/17 1031      Psychosocial Re-Evaluation   Current issues with  Current Depression;History of Depression;Current Sleep Concerns;Current Stress Concerns    Comments  Counselor follow up with Harly today who has been out for awhile - reporting she had Pneumonia.  She continues to report sleep problems and states her mood is "about the same."  When asked if she had contacted any of the providers on the list this counselor gave to her in February, she stated she had not.  When asked if Ayde would be willing to see a Dr. about  medications for her mood; as she reported a history of Bi-polar Disorder, Arika refused - stating - she "takes anough pills already."  She appears to isolate in class and wears her earbuds to listen to music rather than socializing with others.  Emory also appears irritated to speak to counselor or other staff in this program -more than is typical; which makes it difficult to follow with her on goal setting or progress made.  When asked how she is coping with ongoing stressors at this time; [place]ia responded - "I just  keep moving."  counselor attempted to educate Gwyndolyn on need to treat the whole person and how much her mood can impact the rest of her health - she appeared disinterested - and also reports she is not in favor or a "lung transplant" and will just keep doing what she is doing.  Counselor made staff aware of this and Calysta's refusal for mental health treatment at this time.      Expected Outcomes  Jennea refuses mental health recommendations and/or treatment at this time - although she would benefit from it greatly.         Education: Education Goals: Education classes will be provided on a weekly basis, covering required topics. Participant will state understanding/return demonstration of topics presented.  Learning Barriers/Preferences: Learning Barriers/Preferences - 08/10/17 1254      Learning Barriers/Preferences   Learning Barriers  Reading In past year it takes longer to comprehend when reading.    Learning Preferences  Individual Instruction       Education Topics:  Initial Evaluation Education: - Verbal, written and demonstration of respiratory meds, oximetry and breathing techniques. Instruction on use of nebulizers and MDIs and importance of monitoring MDI activations.   Pulmonary Rehab from 10/11/2017 in [facility] Cardiac and Pulmonary Rehab  Date  08/10/17  Educator  SB  Instruction Review Code  1- Verbalizes Understanding      General Nutrition Guidelines/Fats and  Fiber: -Group instruction provided by verbal, written material, models and posters to present the general guidelines for heart healthy nutrition. Gives an explanation and review of dietary fats and fiber.   Controlling Sodium/Reading Food Labels: -Group verbal and written material supporting the discussion of sodium use in heart healthy nutrition. Review and explanation with models, verbal and written materials for utilization of the food label.   Pulmonary Rehab from 10/11/2017 in [facility] Cardiac and Pulmonary Rehab  Date  10/11/17  Educator  CR  Instruction Review Code  1- Verbalizes Understanding      Exercise Physiology & General Exercise Guidelines: - Group verbal and written instruction with models to review the exercise physiology of the cardiovascular system and associated critical values. Provides general exercise guidelines with specific guidelines to those with heart or lung disease.    Aerobic Exercise & Resistance Training: - Gives group verbal and written instruction on the various components of exercise. Focuses on aerobic and resistive training programs and the benefits of this training and how to safely progress through these programs.   Flexibility, Balance, Mind/Body Relaxation: Provides group verbal/written instruction on the benefits of flexibility and balance training, including mind/body exercise modes such as yoga, pilates and tai chi.  Demonstration and skill practice provided.   Pulmonary Rehab from 10/11/2017 in [facility] Cardiac and Pulmonary Rehab  Date  08/25/17  Educator  AS  Instruction Review Code  1- Verbalizes Understanding      Stress and Anxiety: - Provides group verbal and written instruction about the health risks of elevated stress and causes of high stress.  Discuss the correlation between heart/lung disease and anxiety and treatment options. Review healthy ways to manage with stress and anxiety.   Depression: - Provides group verbal and written  instruction on the correlation between heart/lung disease and depressed mood, treatment options, and the stigmas associated with seeking treatment.   Pulmonary Rehab from 10/11/2017 in [facility] Cardiac and Pulmonary Rehab  Date  09/01/17  Educator  [facility]  Instruction Review Code  1- Verbalizes Understanding      Exercise & Equipment Safety: -  Individual verbal instruction and demonstration of equipment use and safety with use of the equipment.   Pulmonary Rehab from 10/11/2017 in [facility] Cardiac and Pulmonary Rehab  Date  08/10/17  Educator  Sb  Instruction Review Code  1- Verbalizes Understanding      Infection Prevention: - Provides verbal and written material to individual with discussion of infection control including proper hand washing and proper equipment cleaning during exercise session.   Pulmonary Rehab from 10/11/2017 in [facility] Cardiac and Pulmonary Rehab  Date  08/10/17  Educator  SB  Instruction Review Code  1- Verbalizes Understanding      Falls Prevention: - Provides verbal and written material to individual with discussion of falls prevention and safety.   Diabetes: - Individual verbal and written instruction to review signs/symptoms of diabetes, desired ranges of glucose level fasting, after meals and with exercise. Advice that pre and post exercise glucose checks will be done for 3 sessions at entry of program.   Chronic Lung Diseases: - Group verbal and written instruction to review updates, respiratory medications, advancements in procedures and treatments. Discuss use of supplemental oxygen including available portable oxygen systems, continuous and intermittent flow rates, concentrators, personal use and safety guidelines. Review proper use of inhaler and spacers. Provide informative websites for self-education.    Energy Conservation: - Provide group verbal and written instruction for methods to conserve energy, plan and organize activities. Instruct on pacing techniques,  use of adaptive equipment and posture/positioning to relieve shortness of breath.   Pulmonary Rehab from 10/11/2017 in [facility] Cardiac and Pulmonary Rehab  Date  09/08/17  Educator  [facility]  Instruction Review Code  1- Verbalizes Understanding      Triggers and Exacerbations: - Group verbal and written instruction to review types of environmental triggers and ways to prevent exacerbations. Discuss weather changes, air quality and the benefits of nasal washing. Review warning signs and symptoms to help prevent infections. Discuss techniques for effective airway clearance, coughing, and vibrations.   AED/CPR: - Group verbal and written instruction with the use of models to demonstrate the basic use of the AED with the basic ABC's of resuscitation.   Pulmonary Rehab from 10/11/2017 in [facility] Cardiac and Pulmonary Rehab  Date  09/10/17  Educator  [facility]  Instruction Review Code  5- Refused Teaching      Anatomy and Physiology of the Lungs: - Group verbal and written instruction with the use of models to provide basic lung anatomy and physiology related to function, structure and complications of lung disease.   Anatomy & Physiology of the Heart: - Group verbal and written instruction and models provide basic cardiac anatomy and physiology, with the coronary electrical and arterial systems. Review of Valvular disease and Heart Failure   Cardiac Medications: - Group verbal and written instruction to review commonly prescribed medications for heart disease. Reviews the medication, class of the drug, and side effects.   Know Your Numbers and Risk Factors: -Group verbal and written instruction about important numbers in your health.  Discussion of what are risk factors and how they play a role in the disease process.  Review of Cholesterol, Blood Pressure, Diabetes, and BMI and the role they play in your overall health.   Sleep Hygiene: -Provides group verbal and written instruction about how sleep can  affect your health.  Define sleep hygiene, discuss sleep cycles and impact of sleep habits. Review good sleep hygiene tips.    Other: -Provides group and verbal instruction on various topics (see  comments)    Knowledge Questionnaire Score: Knowledge Questionnaire Score - 08/10/17 1255      Knowledge Questionnaire Score   Pre Score  15/18 Reviewed correct responses with Nethra and she verbalized understanding of the responses.  She had no further questions today.        Core Components/Risk Factors/Patient Goals at Admission: Personal Goals and Risk Factors at Admission - 08/10/17 1248      Core Components/Risk Factors/Patient Goals on Admission    Weight Management  Yes;Obesity    Intervention  Weight Management: Develop a combined nutrition and exercise program designed to reach desired caloric intake, while maintaining appropriate intake of nutrient and fiber, sodium and fats, and appropriate energy expenditure required for the weight goal.;Weight Management/Obesity: Establish reasonable short term and long term weight goals.;Obesity: Provide education and appropriate resources to help participant work on and attain dietary goals.    Admit Weight  268 lb 12.8 oz (121.9 kg)    Goal Weight: Short Term  265 lb (120.2 kg)    Goal Weight: Long Term  180 lb (81.6 kg)    Expected Outcomes  Short Term: Continue to assess and modify interventions until short term weight is achieved;Long Term: Adherence to nutrition and physical activity/exercise program aimed toward attainment of established weight goal;Weight Loss: Understanding of general recommendations for a balanced deficit meal plan, which promotes 1-2 lb weight loss per week and includes a negative energy balance of [phone] kcal/d    Improve shortness of breath with ADL's  Yes    Intervention  Provide education, individualized exercise plan and daily activity instruction to help decrease symptoms of SOB with activities of daily living.     Expected Outcomes  Short Term: Improve cardiorespiratory fitness to achieve a reduction of symptoms when performing ADLs;Long Term: Be able to perform more ADLs without symptoms or delay the onset of symptoms    Hypertension  Yes    Intervention  Provide education on lifestyle modifcations including regular physical activity/exercise, weight management, moderate sodium restriction and increased consumption of fresh fruit, vegetables, and low fat dairy, alcohol moderation, and smoking cessation.;Monitor prescription use compliance.    Expected Outcomes  Short Term: Continued assessment and intervention until BP is < 140/80m HG in hypertensive participants. < 130/862mHG in hypertensive participants with diabetes, heart failure or chronic kidney disease.;Long Term: Maintenance of blood pressure at goal levels.    Stress  Yes    Intervention  Offer individual and/or small group education and counseling on adjustment to heart disease, stress management and health-related lifestyle change. Teach and support self-help strategies.;Refer participants experiencing significant psychosocial distress to appropriate mental health specialists for further evaluation and treatment. When possible, include family members and significant others in education/counseling sessions.    Expected Outcomes  Short Term: Participant demonstrates changes in health-related behavior, relaxation and other stress management skills, ability to obtain effective social support, and compliance with psychotropic medications if prescribed.;Long Term: Emotional wellbeing is indicated by absence of clinically significant psychosocial distress or social isolation.       Core Components/Risk Factors/Patient Goals Review:  Goals and Risk Factor Review    Row Name 09/13/17 1045 10/11/17 1047           Core Components/Risk Factors/Patient Goals Review   Personal Goals Review  Weight Management/Obesity;Hypertension;Stress;Improve shortness of  breath with ADL's  Weight Management/Obesity;Hypertension;Improve shortness of breath with ADL's;Stress      Review  RoAbrartated that she has noticed some improvements in her SOB during  daily activities. Her blood pressure as also reported to be under control with diet and exercise/no meds. She stated stress levels are about the same and her weight has also been mantained.   Treva reports taking all meds and BP is reported to be under control. Her weight has been maintained but she still has a goal to lose weight. She has been out due to sickness but has retured and started to ease back into her exercise routine.       Expected Outcomes  Short: Continue attending pulmonary rehab regularly and continue to take all meds, short term goal weight 260lbs  Long: Independently continue exercise routine. Long term goal weight 220 lbs.    Short: Attending pulmonary rehab regularly after being out due to sickness and ease back into previous workloads, and continue to take all meds, short term goal weight 260lbs  Long: Independently continue exercise routine. Long term goal weight 220 lbs.           Core Components/Risk Factors/Patient Goals at Discharge (Final Review):  Goals and Risk Factor Review - 10/11/17 1047      Core Components/Risk Factors/Patient Goals Review   Personal Goals Review  Weight Management/Obesity;Hypertension;Improve shortness of breath with ADL's;Stress    Review  Kiauna reports taking all meds and BP is reported to be under control. Her weight has been maintained but she still has a goal to lose weight. She has been out due to sickness but has retured and started to ease back into her exercise routine.     Expected Outcomes  Short: Attending pulmonary rehab regularly after being out due to sickness and ease back into previous workloads, and continue to take all meds, short term goal weight 260lbs  Long: Independently continue exercise routine. Long term goal weight 220 lbs.         ITP  Comments: ITP Comments    Row Name 08/10/17 1235 08/10/17 1301 08/23/17 0921 09/20/17 0828 09/27/17 1635   ITP Comments  Medical review completed today   ITP sent to Dr Loleta Chance for review, changes as needed and signature.  Documentation of diagnosis can be found in [facility] encounter1/14/2019  Zuriel is working on weight loss to be eligible to talk to the lung transplant team.   30 day review completed. ITP sent to Dr. Emily Filbert Director of [place]. Continue with ITP unless changes are made by physician.  30 day review completed. ITP sent to Dr. Emily Filbert Director of [place]. Continue with ITP unless changes are made by physician.  Left message for Suanne Marker to call back. She has not attended class since 09/17/17.   Row Name 10/06/17 1458 10/18/17 0837         ITP Comments  Taimane has no attended since 09/17/17.  I left a VM about returning to class.   30 day review completed. ITP sent to Dr. Emily Filbert Director of [place]. Continue with ITP unless changes are made by physician         Comments: 30 day review

## 2017-11-01 DIAGNOSIS — J45909 Unspecified asthma, uncomplicated: Secondary | ICD-10-CM

## 2017-11-01 NOTE — Progress Notes (Signed)
Daily Session Note  Patient Details  Name: Lisa [name] MRN: [id number] Date of Birth: [birth year] Referring Provider:     Pulmonary Rehab from 08/10/2017 in [facility] Cardiac and Pulmonary Rehab  Referring Provider  Raul Del      Encounter Date: 11/01/2017  Check In: Session Check In - 11/01/17 1008      Check-In   Location  [facility]-Cardiac & Pulmonary Rehab    Staff Present  Earlean Shawl, BS, ACSM CEP, Exercise Physiologist;Mandi Zachery Conch, BS, PEC;Jazyiah Yiu [employer] physician immediately available to respond to emergencies  [facility] immediately available ER MD    Physician(s)  Dr. Reita Cliche and Cinda Quest    Medication changes reported      No    Fall or balance concerns reported     No    Tobacco Cessation  No Change    Warm-up and Cool-down  Performed as group-led instruction    Resistance Training Performed  Yes    VAD Patient?  No      Pain Assessment   Currently in Pain?  No/denies          Social History   Tobacco Use  Smoking Status Never Smoker  Smokeless Tobacco Never Used    Goals Met:  Independence with exercise equipment Exercise tolerated well Personal goals reviewed No report of cardiac concerns or symptoms Strength training completed today  Goals Unmet:  Not Applicable  Comments: Pt able to follow exercise prescription today without complaint.  Will continue to monitor for progression.   Dr. Emily Filbert is Medical Director for [place] and [facility].

## 2017-11-10 ENCOUNTER — Encounter: Payer: Medicaid Other | Attending: Specialist

## 2017-11-10 DIAGNOSIS — J45909 Unspecified asthma, uncomplicated: Secondary | ICD-10-CM | POA: Diagnosis not present

## 2017-11-10 NOTE — Progress Notes (Signed)
Daily Session Note  Patient Details  Name: Lisa [name] MRN: [id number] Date of Birth: 1971/03/08 Referring Provider:     Pulmonary Rehab from 08/10/2017 in [facility] Cardiac and Pulmonary Rehab  Referring Provider  Raul Del      Encounter Date: 11/10/2017  Check In: Session Check In - 11/10/17 1019      Check-In   Location  [facility]-Cardiac & Pulmonary Rehab    Staff Present  Justin Mend RCP,RRT,BSRT;Amanda Oletta Darter, BA, ACSM CEP, Exercise Physiologist;Jessica Luan [place], [place], RCEP, CCRP, Exercise Physiologist    Supervising physician immediately available to respond to emergencies  [facility] immediately available ER MD    Physician(s)  Dr. Alfred Levins and Burlene Arnt    Medication changes reported      No    Fall or balance concerns reported     No    Tobacco Cessation  No Change    Warm-up and Cool-down  Performed as group-led instruction    Resistance Training Performed  Yes    VAD Patient?  No      Pain Assessment   Currently in Pain?  No/denies          Social History   Tobacco Use  Smoking Status Never Smoker  Smokeless Tobacco Never Used    Goals Met:  Independence with exercise equipment Exercise tolerated well No report of cardiac concerns or symptoms Strength training completed today  Goals Unmet:  Not Applicable  Comments: Pt able to follow exercise prescription today without complaint.  Will continue to monitor for progression.   Dr. Emily Filbert is Medical Director for [place] and [facility].

## 2017-11-15 DIAGNOSIS — J45909 Unspecified asthma, uncomplicated: Secondary | ICD-10-CM

## 2017-11-15 NOTE — Progress Notes (Signed)
Daily Session Note  Patient Details  Name: Lisa [name] MRN: [id number] Date of Birth: 06/30/1971 Referring Provider:     Pulmonary Rehab from 08/10/2017 in [facility] Cardiac and Pulmonary Rehab  Referring Provider  Raul Del      Encounter Date: 11/15/2017  Check In: Session Check In - 11/15/17 1017      Check-In   Location  [facility]-Cardiac & Pulmonary Rehab    Staff Present  Justin Mend Jaci Carrel, BS, ACSM CEP, Exercise Physiologist;Amanda Oletta Darter, [place], ACSM CEP, Exercise Physiologist    Supervising physician immediately available to respond to emergencies  [facility] immediately available ER MD    Physician(s)  Drs. Paduchowski and Rifenbark    Medication changes reported      No    Fall or balance concerns reported     No    Tobacco Cessation  No Change    Warm-up and Cool-down  Performed as group-led instruction    Resistance Training Performed  Yes    VAD Patient?  No      Pain Assessment   Currently in Pain?  No/denies    Multiple Pain Sites  No          Social History   Tobacco Use  Smoking Status Never Smoker  Smokeless Tobacco Never Used    Goals Met:  Proper associated with RPD/PD & O2 Sat Independence with exercise equipment Exercise tolerated well No report of cardiac concerns or symptoms Strength training completed today  Goals Unmet:  Not Applicable  Comments: Pt able to follow exercise prescription today without complaint.  Will continue to monitor for progression.    Dr. Emily Filbert is Medical Director for [place] and [facility].

## 2017-11-15 NOTE — Progress Notes (Signed)
Pulmonary Individual Treatment Plan  Patient Details  Name: Lisa [name] MRN: [id number] Date of Birth: 04/02/1971 Referring Provider:     Pulmonary Rehab from 08/10/2017 in [facility] Cardiac and Pulmonary Rehab  Referring Provider  Raul Del      Initial Encounter Date:    Pulmonary Rehab from 08/10/2017 in [facility] Cardiac and Pulmonary Rehab  Date  08/10/17  Referring Provider  Raul Del      Visit Diagnosis: Uncomplicated asthma, unspecified asthma severity, unspecified whether persistent  Patient's Home Medications on Admission:  Current Outpatient Medications:  .  albuterol (PROVENTIL HFA;VENTOLIN HFA) 108 (90 Base) MCG/ACT inhaler, Inhale 2 puffs into the lungs every 6 (six) hours as needed for wheezing or shortness of breath., Disp: 1 Inhaler, Rfl: 2 .  azaTHIOprine (IMURAN) 50 MG tablet, Take by mouth., Disp: , Rfl:  .  ferrous sulfate 325 (65 FE) MG tablet, Take 325 mg by mouth daily with breakfast., Disp: , Rfl:  .  gabapentin (NEURONTIN) 100 MG capsule, Take by mouth., Disp: , Rfl:  .  Ipratropium-Albuterol (COMBIVENT) 20-100 MCG/ACT AERS respimat, Inhale 1 puff into the lungs every 6 (six) hours., Disp: , Rfl:  .  montelukast (SINGULAIR) 10 MG tablet, Take by mouth., Disp: , Rfl:  .  omeprazole (PRILOSEC) 40 MG capsule, Take by mouth., Disp: , Rfl:  .  phentermine 37.5 MG capsule, Take by mouth., Disp: , Rfl:  .  predniSONE (DELTASONE) 10 MG tablet, Take 20 mg by mouth daily., Disp: , Rfl:   Past Medical History: Past Medical History:  Diagnosis Date  . Abdominal pain   . Anemia    hx of anemic  . Asthma   . Bipolar 1 disorder ([place])   . Blood in stool   . Change in voice   . Chest pain   . Constipation   . Depression   . Diverticulitis   . DVT (deep venous thrombosis) ([place]) 12/13/2014  . Dysrhythmia   . Fatigue   . Gallstones 04/10/2011   with biliary pancreatitis  . GERD (gastroesophageal reflux disease)   . Migraines   . MRSA carrier   . Nausea   .  Pancreatitis   . PTSD (post-traumatic stress disorder)   . Pulmonary embolism ([place]) 12/13/2014  . Sleep apnea    cpap  . Trouble swallowing     Tobacco Use: Social History   Tobacco Use  Smoking Status Never Smoker  Smokeless Tobacco Never Used    Labs: Recent Review Scientist, physiological    Labs for ITP Cardiac and Pulmonary Rehab Latest Ref Rng & Units 03/18/2014   Hemoglobin A1c 4.2 - 6.3 % 5.6       Pulmonary Assessment Scores: Pulmonary Assessment Scores    Row Name 08/10/17 1257         ADL UCSD   ADL Phase  Entry     SOB Score total  98     Rest  2     Walk  4     Stairs  5     Bath  4     Dress  3     Shop  4       CAT Score   CAT Score  39       mMRC Score   mMRC Score  2        Pulmonary Function Assessment: Pulmonary Function Assessment - 08/10/17 1256      Breath   Shortness of Breath  Fear of Shortness of Breath;Yes;Limiting activity  Exercise Target Goals:    Exercise Program Goal: Individual exercise prescription set using results from initial 6 min walk test and THRR while considering  patient's activity barriers and safety.    Exercise Prescription Goal: Initial exercise prescription builds to 30-45 minutes a day of aerobic activity, 2-3 days per week.  Home exercise guidelines will be given to patient during program as part of exercise prescription that the participant will acknowledge.  Activity Barriers & Risk Stratification: Activity Barriers & Cardiac Risk Stratification - 08/10/17 1247      Activity Barriers & Cardiac Risk Stratification   Activity Barriers  Back Problems;Shortness of Breath       6 Minute Walk: 6 Minute Walk    Row Name 08/10/17 1318         6 Minute Walk   Phase  Initial     Distance  1250 feet     Walk Time  6 minutes     # of Rest Breaks  0     MPH  2.37     METS  4.13     RPE  19     Perceived Dyspnea   4     VO2 Peak  14.47     Symptoms  Yes (comment)     Comments  back pain 7/10      Resting HR  77 bpm     Resting BP  126/76     Resting Oxygen Saturation   98 %     Exercise Oxygen Saturation  during 6 min walk  91 %     Max Ex. HR  138 bpm     Max Ex. BP  162/86     2 Minute Post BP  138/84       Interval HR   1 Minute HR  112     2 Minute HR  133     3 Minute HR  136     4 Minute HR  134     5 Minute HR  138     6 Minute HR  115     2 Minute Post HR  91     Interval Heart Rate?  Yes       Interval Oxygen   Interval Oxygen?  Yes     Baseline Oxygen Saturation %  98 %     1 Minute Liters of Oxygen  0 L     2 Minute Oxygen Saturation %  91 %     2 Minute Liters of Oxygen  0 L     3 Minute Oxygen Saturation %  94 %     3 Minute Liters of Oxygen  0 L     4 Minute Oxygen Saturation %  95 %     4 Minute Liters of Oxygen  0 L     5 Minute Oxygen Saturation %  94 %     5 Minute Liters of Oxygen  0 L     6 Minute Oxygen Saturation %  95 %     6 Minute Liters of Oxygen  0 L     2 Minute Post Oxygen Saturation %  100 %     2 Minute Post Liters of Oxygen  0 L       Oxygen Initial Assessment: Oxygen Initial Assessment - 08/10/17 1238      Home Oxygen   Home Oxygen Device  Home Concentrator    Sleep Oxygen Prescription  Continuous;CPAP  Liters per minute  2    Home Exercise Oxygen Prescription  None    Home at Rest Exercise Oxygen Prescription  None    Compliance with Home Oxygen Use  Yes      Initial 6 min Walk   Oxygen Used  None      Program Oxygen Prescription   Program Oxygen Prescription  None      Intervention   Short Term Goals  To learn and exhibit compliance with exercise, home and travel O2 prescription;To learn and understand importance of maintaining oxygen saturations>88%;To learn and demonstrate proper use of respiratory medications;To learn and understand importance of monitoring SPO2 with pulse oximeter and demonstrate accurate use of the pulse oximeter.;To learn and demonstrate proper pursed lip breathing techniques or other breathing  techniques.    Long  Term Goals  Exhibits compliance with exercise, home and travel O2 prescription;Verbalizes importance of monitoring SPO2 with pulse oximeter and return demonstration;Maintenance of O2 saturations>88%;Exhibits proper breathing techniques, such as pursed lip breathing or other method taught during program session;Compliance with respiratory medication;Demonstrates proper use of MDI's       Oxygen Re-Evaluation: Oxygen Re-Evaluation    Row Name 08/16/17 1029 09/13/17 1105 10/11/17 1051 11/01/17 1035       Program Oxygen Prescription   Program Oxygen Prescription  -  None  None  None      Home Oxygen   Home Oxygen Device  -  Home Concentrator  Home Concentrator  Home Concentrator    Sleep Oxygen Prescription  -  Continuous  Continuous  Continuous    Liters per minute  -  '2  2  2    '$ Home Exercise Oxygen Prescription  -  None  None  None    Home at Rest Exercise Oxygen Prescription  -  None  None  None    Compliance with Home Oxygen Use  -  Yes  Yes  Yes      Goals/Expected Outcomes   Short Term Goals  To learn and exhibit compliance with exercise, home and travel O2 prescription;To learn and understand importance of maintaining oxygen saturations>88%;To learn and demonstrate proper use of respiratory medications;To learn and understand importance of monitoring SPO2 with pulse oximeter and demonstrate accurate use of the pulse oximeter.;To learn and demonstrate proper pursed lip breathing techniques or other breathing techniques.  To learn and exhibit compliance with exercise, home and travel O2 prescription;To learn and understand importance of maintaining oxygen saturations>88%;To learn and demonstrate proper use of respiratory medications;To learn and understand importance of monitoring SPO2 with pulse oximeter and demonstrate accurate use of the pulse oximeter.;To learn and demonstrate proper pursed lip breathing techniques or other breathing techniques.  To learn and  exhibit compliance with exercise, home and travel O2 prescription;To learn and understand importance of maintaining oxygen saturations>88%;To learn and demonstrate proper use of respiratory medications;To learn and understand importance of monitoring SPO2 with pulse oximeter and demonstrate accurate use of the pulse oximeter.;To learn and demonstrate proper pursed lip breathing techniques or other breathing techniques.  To learn and exhibit compliance with exercise, home and travel O2 prescription;To learn and understand importance of maintaining oxygen saturations>88%;To learn and demonstrate proper use of respiratory medications;To learn and understand importance of monitoring SPO2 with pulse oximeter and demonstrate accurate use of the pulse oximeter.;To learn and demonstrate proper pursed lip breathing techniques or other breathing techniques.    Long  Term Goals  Exhibits compliance with exercise, home and travel O2 prescription;Verbalizes importance of  monitoring SPO2 with pulse oximeter and return demonstration;Maintenance of O2 saturations>88%;Exhibits proper breathing techniques, such as pursed lip breathing or other method taught during program session;Compliance with respiratory medication;Demonstrates proper use of MDI's  Exhibits compliance with exercise, home and travel O2 prescription;Verbalizes importance of monitoring SPO2 with pulse oximeter and return demonstration;Maintenance of O2 saturations>88%;Exhibits proper breathing techniques, such as pursed lip breathing or other method taught during program session;Compliance with respiratory medication;Demonstrates proper use of MDI's  Exhibits compliance with exercise, home and travel O2 prescription;Verbalizes importance of monitoring SPO2 with pulse oximeter and return demonstration;Maintenance of O2 saturations>88%;Exhibits proper breathing techniques, such as pursed lip breathing or other method taught during program session;Compliance with  respiratory medication;Demonstrates proper use of MDI's  Exhibits compliance with exercise, home and travel O2 prescription;Verbalizes importance of monitoring SPO2 with pulse oximeter and return demonstration;Maintenance of O2 saturations>88%;Exhibits proper breathing techniques, such as pursed lip breathing or other method taught during program session;Compliance with respiratory medication;Demonstrates proper use of MDI's    Comments  Reviewed PLB technique with pt.  Talked about how it work and it's important to maintaining his exercise saturations.  Scottlynn stated that she does use PLB at home to help control SOB, and has noticed some improvements in SOB. She does not currently have a pulse oximeter or monitor her oxygen saturations. She was advised to purchase a pulse ox and use it at home.   Appollonia had been out sick and her SOB was worse duing her sickness, but has now improved since getting better and back to class. She did not yet buy a pulse oximeter to monitor her oxygen at home but was encouraged to do so again.   Nazaret is taking predinisone, combivent and albuterol as prescribed. She has no questions about her medicatons. She uses her PLB when she gets short of breath in class and at home. She has not obtained a pulse oximeter yet.  Her oxygen has been above 88 percent on all her exercises and at rest.     Goals/Expected Outcomes  Short: Become more profiecient at using PLB.   Long: Become independent at using PLB.  Short: Purchase a pulse oxymeter and start monitoring oxygen saturations at home. Long: Become independent monitoring oxygen saturations and using PLB at home.   Short: Purchase a pulse oxymeter and start monitoring oxygen saturations at home. Long: Become independent monitoring oxygen saturations and using PLB at home.   Short: continue to exercise to reduce oxygen need. Long: maintain exercise to keep oxygen needs above 88 percent.       Oxygen Discharge (Final Oxygen  Re-Evaluation): Oxygen Re-Evaluation - 11/01/17 1035      Program Oxygen Prescription   Program Oxygen Prescription  None      Home Oxygen   Home Oxygen Device  Home Concentrator    Sleep Oxygen Prescription  Continuous    Liters per minute  2    Home Exercise Oxygen Prescription  None    Home at Rest Exercise Oxygen Prescription  None    Compliance with Home Oxygen Use  Yes      Goals/Expected Outcomes   Short Term Goals  To learn and exhibit compliance with exercise, home and travel O2 prescription;To learn and understand importance of maintaining oxygen saturations>88%;To learn and demonstrate proper use of respiratory medications;To learn and understand importance of monitoring SPO2 with pulse oximeter and demonstrate accurate use of the pulse oximeter.;To learn and demonstrate proper pursed lip breathing techniques or other breathing techniques.  Long  Term Goals  Exhibits compliance with exercise, home and travel O2 prescription;Verbalizes importance of monitoring SPO2 with pulse oximeter and return demonstration;Maintenance of O2 saturations>88%;Exhibits proper breathing techniques, such as pursed lip breathing or other method taught during program session;Compliance with respiratory medication;Demonstrates proper use of MDI's    Comments  Claudetta is taking predinisone, combivent and albuterol as prescribed. She has no questions about her medicatons. She uses her PLB when she gets short of breath in class and at home. She has not obtained a pulse oximeter yet.  Her oxygen has been above 88 percent on all her exercises and at rest.     Goals/Expected Outcomes  Short: continue to exercise to reduce oxygen need. Long: maintain exercise to keep oxygen needs above 88 percent.       Initial Exercise Prescription: Initial Exercise Prescription - 08/10/17 1300      Date of Initial Exercise RX and Referring Provider   Date  08/10/17    Referring Provider  Raul Del      Treadmill   MPH   2.3    Grade  0.5    Minutes  15    METs  2.92      NuStep   Level  2    SPM  80    Minutes  15    METs  3      REL-XR   Level  2    Speed  50    Minutes  15    METs  3      Biostep-RELP   Level  2    SPM  50    Minutes  15    METs  3      Prescription Details   Frequency (times per week)  3    Duration  Progress to 45 minutes of aerobic exercise without signs/symptoms of physical distress      Intensity   THRR 40-80% of Max Heartrate  116-156    Ratings of Perceived Exertion  11-13    Perceived Dyspnea  0-4      Resistance Training   Training Prescription  Yes    Weight  3 lb    Reps  10-15       Perform Capillary Blood Glucose checks as needed.  Exercise Prescription Changes: Exercise Prescription Changes    Row Name 08/25/17 1100 08/25/17 1300 09/10/17 0800 10/20/17 1400 11/03/17 1200     Response to Exercise   Blood Pressure (Admit)  -  146/84  106/64  132/80  122/80   Blood Pressure (Exercise)  -  128/64  -  -  -   Blood Pressure (Exit)  -  126/84  132/82  126/78  132/78   Heart Rate (Admit)  -  82 bpm  102 bpm  115 bpm  115 bpm   Heart Rate (Exercise)  -  109 bpm  132 bpm  138 bpm  128 bpm   Heart Rate (Exit)  -  82 bpm  98 bpm  120 bpm  96 bpm   Oxygen Saturation (Admit)  -  99 %  98 %  97 %  95 %   Oxygen Saturation (Exercise)  -  94 %  94 %  91 %  92 %   Oxygen Saturation (Exit)  -  97 %  98 %  98 %  98 %   Rating of Perceived Exertion (Exercise)  -  '13  14  14  14   '$ Perceived  Dyspnea (Exercise)  -  '3  3  3  3   '$ Symptoms  -  none  none  non2  none   Duration  -  Continue with 45 min of aerobic exercise without signs/symptoms of physical distress.  Continue with 45 min of aerobic exercise without signs/symptoms of physical distress.  Continue with 45 min of aerobic exercise without signs/symptoms of physical distress.  Continue with 45 min of aerobic exercise without signs/symptoms of physical distress.   Intensity  -  THRR unchanged  THRR unchanged   THRR unchanged  THRR unchanged     Progression   Progression  -  Continue to progress workloads to maintain intensity without signs/symptoms of physical distress.  Continue to progress workloads to maintain intensity without signs/symptoms of physical distress.  Continue to progress workloads to maintain intensity without signs/symptoms of physical distress.  Continue to progress workloads to maintain intensity without signs/symptoms of physical distress.   Average METs  -  2.7  3.3  2.94  2.8     Resistance Training   Training Prescription  -  Yes  Yes  Yes  Yes   Weight  -  3 lb  3 lb  3 lb  3 lb   Reps  -  10-15  10-15  10-15  10-15     Interval Training   Interval Training  -  No  No  -  No     Treadmill   MPH  -  2.3  2.3  2.3  2.3   Grade  -  0.5  0.5  0.5  0.5   Minutes  -  '15  15  15  15   '$ METs  -  2.92  2.92  2.92  2.92     NuStep   Level  -  2  -  3  3   SPM  -  80  -  80  80   Minutes  -  15  -  15  15   METs  -  2.5  -  2.8  3     REL-XR   Level  -  -  '5  5  2   '$ Speed  -  -  71  -  -   Minutes  -  -  '15  15  15   '$ METs  -  -  3.7  3.1  2.4     Home Exercise Plan   Plans to continue exercise at  [employer] (comment) [place] (comment) [employer] (comment) [employer] (comment) [employer]   Frequency  Add 1 additional day to program exercise sessions.  -  Add 1 additional day to program exercise sessions.  Add 1 additional day to program exercise sessions.  Add 1 additional day to program exercise sessions.   Initial Home Exercises Provided  08/25/17  -  08/25/17  08/25/17  08/25/17      Exercise Comments: Exercise Comments    Row Name 08/16/17 1028           Exercise Comments  First full day of exercise!  Patient was oriented to gym and equipment including functions, settings, policies, and procedures.  Patient's individual exercise prescription and treatment plan were reviewed.  All starting workloads were  established based on the results of the 6 minute walk test done at initial orientation visit.  The plan for exercise progression was also introduced and  progression will be customized based on patient's performance and goals.          Exercise Goals and Review: Exercise Goals    Row Name 08/10/17 1314             Exercise Goals   Increase Physical Activity  Yes       Intervention  Provide advice, education, support and counseling about physical activity/exercise needs.;Develop an individualized exercise prescription for aerobic and resistive training based on initial evaluation findings, risk stratification, comorbidities and participant's personal goals.       Expected Outcomes  Short Term: Attend rehab on a regular basis to increase amount of physical activity.;Long Term: Exercising regularly at least 3-5 days a week.       Increase Strength and Stamina  Yes       Intervention  Provide advice, education, support and counseling about physical activity/exercise needs.;Develop an individualized exercise prescription for aerobic and resistive training based on initial evaluation findings, risk stratification, comorbidities and participant's personal goals.       Expected Outcomes  Short Term: Increase workloads from initial exercise prescription for resistance, speed, and METs.;Long Term: Improve cardiorespiratory fitness, muscular endurance and strength as measured by increased METs and functional capacity (6MWT);Short Term: Perform resistance training exercises routinely during rehab and add in resistance training at home       Able to understand and use rate of perceived exertion (RPE) scale  Yes       Intervention  Provide education and explanation on how to use RPE scale       Expected Outcomes  Short Term: Able to use RPE daily in rehab to express subjective intensity level;Long Term:  Able to use RPE to guide intensity level when exercising independently       Able to understand and use  Dyspnea scale  Yes       Intervention  Provide education and explanation on how to use Dyspnea scale       Expected Outcomes  Short Term: Able to use Dyspnea scale daily in rehab to express subjective sense of shortness of breath during exertion;Long Term: Able to use Dyspnea scale to guide intensity level when exercising independently       Knowledge and understanding of Target Heart Rate Range (THRR)  Yes       Intervention  Provide education and explanation of THRR including how the numbers were predicted and where they are located for reference       Expected Outcomes  Short Term: Able to state/look up THRR;Short Term: Able to use daily as guideline for intensity in rehab;Long Term: Able to use THRR to govern intensity when exercising independently       Able to check pulse independently  Yes       Intervention  Provide education and demonstration on how to check pulse in carotid and radial arteries.;Review the importance of being able to check your own pulse for safety during independent exercise       Expected Outcomes  Short Term: Able to explain why pulse checking is important during independent exercise;Long Term: Able to check pulse independently and accurately       Understanding of Exercise Prescription  Yes       Intervention  Provide education, explanation, and written materials on patient's individual exercise prescription       Expected Outcomes  Short Term: Able to explain program exercise prescription;Long Term: Able to explain home exercise prescription to exercise independently  Exercise Goals Re-Evaluation : Exercise Goals Re-Evaluation    Row Name 08/16/17 1028 08/25/17 1138 09/10/17 0814 10/11/17 1103 10/20/17 1456     Exercise Goal Re-Evaluation   Exercise Goals Review  Understanding of Exercise Prescription;Able to understand and use Dyspnea scale;Knowledge and understanding of Target Heart Rate Range (THRR);Able to understand and use rate of perceived exertion  (RPE) scale  Increase Physical Activity;Increase Strength and Stamina  Increase Physical Activity;Able to understand and use rate of perceived exertion (RPE) scale;Increase Strength and Stamina;Able to understand and use Dyspnea scale  Increase Physical Activity;Able to understand and use rate of perceived exertion (RPE) scale;Increase Strength and Stamina;Able to understand and use Dyspnea scale  Increase Physical Activity;Able to understand and use rate of perceived exertion (RPE) scale;Increase Strength and Stamina;Able to understand and use Dyspnea scale   Comments  Reviewed RPE scale, THR and program prescription with pt today.  Pt voiced understanding and was given a copy of goals to take home.   Reviewed home exercise with pt today.  Pt plans to go the gym 1 extra day for exercise.  Reviewed THR, pulse, RPE, sign and symptoms, NTG use, and when to call 911 or MD.  Also discussed weather considerations and indoor options.  Pt voiced understanding.  Pt voiced understanding.  D[place]ss is tolerating exercise well.  She has increased level on the XR and reaches her THR range.  Staff will continue to monitor.  Has been out sick and just returned to class. Working back up to previous workolads.   Xin tolerates exercise well.  She would benefit from regular attendance 3 times per week.   Expected Outcomes  Short: Use RPE daily to regulate intensity.  Long: Follow program prescription in THR.  Short: add 1 extra day of exercise. Long: maintain and add more days to home exercise.  Short - Carrina will contiinue to attend three times per week Long - Maribell will increase overall MET level  Short: Return to previous workloads prior to being sick and become consistant with attendance since she is no longer sick. Long: [place] will increase overall MET level and be consistant with her exercise to help in weight loss.   Short - Hazleigh will attend regularly Long - Ivonne will maintain exercise on her own   [place] Name 11/03/17 1246              Exercise Goal Re-Evaluation   Exercise Goals Review  Increase Physical Activity;Able to understand and use rate of perceived exertion (RPE) scale;Increase Strength and Stamina;Able to understand and use Dyspnea scale       Comments  Deidra has attended only 3 sessions in April so far.  She will see more progress with regular attendance.       Expected Outcomes  Short - Kaylia will attend class on a consistent basis 3 times per week Long - Ameya will imporve overall MET level          Discharge Exercise Prescription (Final Exercise Prescription Changes): Exercise Prescription Changes - 11/03/17 1200      Response to Exercise   Blood Pressure (Admit)  122/80    Blood Pressure (Exit)  132/78    Heart Rate (Admit)  115 bpm    Heart Rate (Exercise)  128 bpm    Heart Rate (Exit)  96 bpm    Oxygen Saturation (Admit)  95 %    Oxygen Saturation (Exercise)  92 %    Oxygen Saturation (Exit)  98 %  Rating of Perceived Exertion (Exercise)  14    Perceived Dyspnea (Exercise)  3    Symptoms  none    Duration  Continue with 45 min of aerobic exercise without signs/symptoms of physical distress.    Intensity  THRR unchanged      Progression   Progression  Continue to progress workloads to maintain intensity without signs/symptoms of physical distress.    Average METs  2.8      Resistance Training   Training Prescription  Yes    Weight  3 lb    Reps  10-15      Interval Training   Interval Training  No      Treadmill   MPH  2.3    Grade  0.5    Minutes  15    METs  2.92      NuStep   Level  3    SPM  80    Minutes  15    METs  3      REL-XR   Level  2    Minutes  15    METs  2.4      Home Exercise Plan   Plans to continue exercise at  [employer] (comment) Gym    Frequency  Add 1 additional day to program exercise sessions.    Initial Home Exercises Provided  08/25/17       Nutrition:  Target Goals: Understanding of nutrition guidelines, daily  intake of sodium '1500mg'$ , cholesterol '200mg'$ , calories 30% from fat and 7% or less from saturated fats, daily to have 5 or more servings of fruits and vegetables.  Biometrics: Pre Biometrics - 08/10/17 1313      Pre Biometrics   Height  '5\' 8"'$  (1.727 m)    Weight  268 lb 12.8 oz (121.9 kg)    Waist Circumference  42 inches    Hip Circumference  44.5 inches    Waist to Hip Ratio  0.94 %    BMI (Calculated)  40.88        Nutrition Therapy Plan and Nutrition Goals: Nutrition Therapy & Goals - 09/13/17 1059      Intervention Plan   Expected Outcomes  Short Term Goal: Understand basic principles of dietary content, such as calories, fat, sodium, cholesterol and nutrients.;Short Term Goal: A plan has been developed with personal nutrition goals set during dietitian appointment.;Long Term Goal: Adherence to prescribed nutrition plan.       Nutrition Assessments: Nutrition Assessments - 08/10/17 1247      MEDFICTS Scores   Pre Score  46       Nutrition Goals Re-Evaluation: Nutrition Goals Re-Evaluation    Row Name 09/13/17 1100 10/11/17 1056 11/01/17 1039         Goals   Current Weight  269 lb (122 kg)  272 lb (123.4 kg)  273 lb 11.2 oz (124.1 kg)     Nutrition Goal  Look for dietary sources of Vitamin B12 such as egg yolks, mushrooms, nutritional yeast  -  lose weight, Have more energy.     Comment  Angellynn met with the dietician and set initial nutrition goals that she stated she is already following much of.   Ivalee stated that she is eating when she feels like it and is trying to follow healthy eating guidelines.   She states that her dietician appointment was pointless. She has not had an appitite this morning and did not eat breakfast.     Expected Outcome  Short Term Goal: Understand basic principles of dietary content, such as calories, fat, sodium, cholesterol and nutrients.;Short Term Goal: A plan has been developed with personal nutrition goals set during dietitian  appointment.;Long Term Goal: Adherence to prescribed nutrition plan.;  -  Short: make better eating choices to lose weight. Long: lose weight and maintain weight loss.       Personal Goal #2 Re-Evaluation   Personal Goal #2  Work on incorporating a "mini" meal or snack during the first part of the day to help get you on a more regular eating schedule; aim for 200kcal extra per day to start  Short: get into consistance healthy eating pattern.  weight goal of 260 lbs  Long: Goal weight 220 lbs.   -        Nutrition Goals Discharge (Final Nutrition Goals Re-Evaluation): Nutrition Goals Re-Evaluation - 11/01/17 1039      Goals   Current Weight  273 lb 11.2 oz (124.1 kg)    Nutrition Goal  lose weight, Have more energy.    Comment  She states that her dietician appointment was pointless. She has not had an appitite this morning and did not eat breakfast.    Expected Outcome  Short: make better eating choices to lose weight. Long: lose weight and maintain weight loss.       Psychosocial: Target Goals: Acknowledge presence or absence of significant depression and/or stress, maximize coping skills, provide positive support system. Participant is able to verbalize types and ability to use techniques and skills needed for reducing stress and depression.   Initial Review & Psychosocial Screening: Initial Psych Review & Screening - 08/10/17 1249      Initial Review   Current issues with  Current Depression;History of Depression;Current Sleep Concerns;Current Stress Concerns    Source of Stress Concerns  Chronic Illness;Family;Transportation;Financial;Unable to perform yard/household activities;Unable to participate in former interests or hobbies    Comments  Currently sleeping only 2-4 hours. Waking up from coughing and not able to immediately go back to sleep. Applied for disability and was denied, now working with a Chief Executive Officer to reapply for disability benefits. Not able to manage any ADL's without  needing to stop frequently because so short of breath      Family Dynamics   Good Support System?  Yes      Barriers   Psychosocial barriers to participate in program  There are no identifiable barriers or psychosocial needs.;The patient should benefit from training in stress management and relaxation.      Screening Interventions   Interventions  Encouraged to exercise;Provide feedback about the scores to participant;Program counselor consult;To provide support and resources with identified psychosocial needs    Expected Outcomes  Short Term goal: Utilizing psychosocial counselor, staff and physician to assist with identification of specific Stressors or current issues interfering with healing process. Setting desired goal for each stressor or current issue identified.;Long Term Goal: Stressors or current issues are controlled or eliminated.;Short Term goal: Identification and review with participant of any Quality of Life or Depression concerns found by scoring the questionnaire.;Long Term goal: The participant improves quality of Life and PHQ9 Scores as seen by post scores and/or verbalization of changes       Quality of Life Scores:  Scores of 19 and below usually indicate a poorer quality of life in these areas.  A difference of  2-3 points is a clinically meaningful difference.  A difference of 2-3 points in the total score of the Quality of  Life Index has been associated with significant improvement in overall quality of life, self-image, physical symptoms, and general health in studies assessing change in quality of life.  PHQ-9: Recent Review Flowsheet Data    Depression screen [facility] 2/9 08/10/2017   Decreased Interest 3   Down, Depressed, Hopeless 2   PHQ - 2 Score 5   Altered sleeping 3   Tired, decreased energy 3   Change in appetite 3   Feeling bad or failure about yourself  2   Trouble concentrating 3   Moving slowly or fidgety/restless 3   Suicidal thoughts 0   PHQ-9 Score  22   Difficult doing work/chores Extremely dIfficult     Interpretation of Total Score  Total Score Depression Severity:  1-4 = Minimal depression, 5-9 = Mild depression, 10-14 = Moderate depression, 15-19 = Moderately severe depression, 20-27 = Severe depression   Psychosocial Evaluation and Intervention: Psychosocial Evaluation - 08/18/17 1133      Psychosocial Evaluation & Interventions   Interventions  Encouraged to exercise with the program and follow exercise prescription;Stress management education;Relaxation education;Therapist referral    Comments  Counselor met with Zaidy today for initial psychsocial evaluation.  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  She has a strong support system with family close by and (3) children in the home - (1) who is 21 years old.  Alicea has struggled with GERD; Sleep Apnea and multiple diagnoses of mood disorders over the years.  She reports sleeping only ~4 hours/ night sitting up with Oxygen.  Her appetite varies.  She reports having Bipolar in the past and is not on any current medications for this - as she "just moved back to [place] from [place]" and hasn't put providers in place yet.  Counselor reviewed the PHQ-9 with Jannifer - scoring "22" which indicates severe depression.  She admits her mood is not stable currently and she has multiple stressors with her health and family.  Counselor provided a list of providers locally for Cornie to contact for treatment for her mental health.  Tahtiana has goals to lose some weight and hopefully breathe better while in this program.  Counselor and staff will follow with Shaneeka throughout the course of this program.      Expected Outcomes  Adilynn will benefit from consistent exercise to achieve her stated goals.  The educational and psychoeducational components will be helpful in understanding her condition and coping more positively.  Sahory will meet with the  dietician for addressing her weight loss goals.  She was given a list of providers to contact for mental health evaluations and treatment and agreed to put this in place.      Continue Psychosocial Services   Follow up required by counselor       Psychosocial Re-Evaluation: Psychosocial Re-Evaluation    Solana Name 09/13/17 1112 10/08/17 1113 10/11/17 1031 11/01/17 1041       Psychosocial Re-Evaluation   Current issues with  Current Depression;Current Stress Concerns  -  Current Depression;History of Depression;Current Sleep Concerns;Current Stress Concerns  Current Depression;History of Depression;Current Sleep Concerns;Current Stress Concerns    Comments  Rayah stated that she has not followed up with any of the mental health care providers given to her by program counselor. She is still sleeping about only 4 hours a night.   Kayde returned after being absent most of the month. She was in good spirits and  reported she was happy to be back. She said her time off was needed, but that she plans on coming next week as well.   Counselor follow up with Milcah today who has been out for awhile - reporting she had Pneumonia.  She continues to report sleep problems and states her mood is "about the same."  When asked if she had contacted any of the providers on the list this counselor gave to her in February, she stated she had not.  When asked if Shanaya would be willing to see a Dr. about medications for her mood; as she reported a history of Bi-polar Disorder, Wei refused - stating - she "takes anough pills already."  She appears to isolate in class and wears her earbuds to listen to music rather than socializing with others.  Nastassia also appears irritated to speak to counselor or other staff in this program -more than is typical; which makes it difficult to follow with her on goal setting or progress made.  When asked how she is coping with ongoing stressors at this time; [place] responded - "I just keep moving."   counselor attempted to educate Katheleen on need to treat the whole person and how much her mood can impact the rest of her health - she appeared disinterested - and also reports she is not in favor or a "lung transplant" and will just keep doing what she is doing.  Counselor made staff aware of this and Dynasty's refusal for mental health treatment at this time.    Tomicka states that everything we went over last time is still the same and there is nothing new to report.    Expected Outcomes  Short: Contact a mental health professional to establish care related to her current depression and stress. Long: Be able to sleep better each night and manage mental health wellbeing.   short: continue coming to [place]. Long: graduate from [employer].   Alivia refuses mental health recommendations and/or treatment at this time - although she would benefit from it greatly.    Short: attend [facility] regularly to decrease stress. Long: Exercise after [facility] to decrease stress    Interventions  Encouraged to attend [facility] for the exercise  -  -  Encouraged to attend [facility] for the exercise    Continue Psychosocial Services   Follow up required by counselor  -  -  Follow up required by staff       Psychosocial Discharge (Final Psychosocial Re-Evaluation): Psychosocial Re-Evaluation - 11/01/17 1041      Psychosocial Re-Evaluation   Current issues with  Current Depression;History of Depression;Current Sleep Concerns;Current Stress Concerns    Comments  Goddess states that everything we went over last time is still the same and there is nothing new to report.    Expected Outcomes  Short: attend [facility] regularly to decrease stress. Long: Exercise after [facility] to decrease stress    Interventions  Encouraged to attend [facility] for the exercise    Continue Psychosocial Services   Follow up required by staff       Education: Education Goals: Education classes will be  provided on a weekly basis, covering required topics. Participant will state understanding/return demonstration of topics presented.  Learning Barriers/Preferences: Learning Barriers/Preferences - 08/10/17 1254      Learning Barriers/Preferences   Learning Barriers  Reading In past year it takes longer to comprehend when reading.    Learning Preferences  Individual Instruction       Education  Topics:  Initial Evaluation Education: - Verbal, written and demonstration of respiratory meds, oximetry and breathing techniques. Instruction on use of nebulizers and MDIs and importance of monitoring MDI activations.   Pulmonary Rehab from 11/10/2017 [facility] Cardiac and Pulmonary Rehab  Date  08/10/17  Educator  SB  Instruction Review Code  1- Verbalizes Understanding      General Nutrition Guidelines/Fats and Fiber: -Group instruction provided by verbal, written material, models and posters to present the general guidelines for heart healthy nutrition. Gives an explanation and review of dietary fats and fiber.   Controlling Sodium/Reading Food Labels: -Group verbal and written material supporting the discussion of sodium use in heart healthy nutrition. Review and explanation with models, verbal and written materials for utilization of the food label.   Pulmonary Rehab from 11/10/2017 in [facility] Cardiac and Pulmonary Rehab  Date  10/11/17  Educator  CR  Instruction Review Code  1- Verbalizes Understanding      Exercise Physiology & General Exercise Guidelines: - Group verbal and written instruction with models to review the exercise physiology of the cardiovascular system and associated critical values. Provides general exercise guidelines with specific guidelines to those with heart or lung disease.    Aerobic Exercise & Resistance Training: - Gives group verbal and written instruction on the various components of exercise. Focuses on aerobic and resistive training programs and the benefits  of this training and how to safely progress through these programs.   Pulmonary Rehab from 11/10/2017 in [facility] Cardiac and Pulmonary Rehab  Date  11/10/17  Educator  [facility]  Instruction Review Code  1- Geologist, engineering, Balance, Mind/Body Relaxation: Provides group verbal/written instruction on the benefits of flexibility and balance training, including mind/body exercise modes such as yoga, pilates and tai chi.  Demonstration and skill practice provided.   Pulmonary Rehab from 11/10/2017 in [facility] Cardiac and Pulmonary Rehab  Date  08/25/17  Educator  AS  Instruction Review Code  1- Verbalizes Understanding      Stress and Anxiety: - Provides group verbal and written instruction about the health risks of elevated stress and causes of high stress.  Discuss the correlation between heart/lung disease and anxiety and treatment options. Review healthy ways to manage with stress and anxiety.   Depression: - Provides group verbal and written instruction on the correlation between heart/lung disease and depressed mood, treatment options, and the stigmas associated with seeking treatment.   Pulmonary Rehab from 11/10/2017 in [facility] Cardiac and Pulmonary Rehab  Date  09/01/17  Educator  [facility]  Instruction Review Code  1- Verbalizes Understanding      Exercise & Equipment Safety: - Individual verbal instruction and demonstration of equipment use and safety with use of the equipment.   Pulmonary Rehab from 11/10/2017 in [facility] Cardiac and Pulmonary Rehab  Date  08/10/17  Educator  Sb  Instruction Review Code  1- Verbalizes Understanding      Infection Prevention: - Provides verbal and written material to individual with discussion of infection control including proper hand washing and proper equipment cleaning during exercise session.   Pulmonary Rehab from 11/10/2017 in [facility] Cardiac and Pulmonary Rehab  Date  08/10/17  Educator  SB  Instruction Review Code  1- Verbalizes  Understanding      Falls Prevention: - Provides verbal and written material to individual with discussion of falls prevention and safety.   Diabetes: - Individual verbal and written instruction to review signs/symptoms of diabetes, desired ranges of  glucose level fasting, after meals and with exercise. Advice that pre and post exercise glucose checks will be done for 3 sessions at entry of program.   Chronic Lung Diseases: - Group verbal and written instruction to review updates, respiratory medications, advancements in procedures and treatments. Discuss use of supplemental oxygen including available portable oxygen systems, continuous and intermittent flow rates, concentrators, personal use and safety guidelines. Review proper use of inhaler and spacers. Provide informative websites for self-education.    Energy Conservation: - Provide group verbal and written instruction for methods to conserve energy, plan and organize activities. Instruct on pacing techniques, use of adaptive equipment and posture/positioning to relieve shortness of breath.   Pulmonary Rehab from 11/10/2017 in [facility] Cardiac and Pulmonary Rehab  Date  09/08/17  Educator  [facility]  Instruction Review Code  1- Verbalizes Understanding      Triggers and Exacerbations: - Group verbal and written instruction to review types of environmental triggers and ways to prevent exacerbations. Discuss weather changes, air quality and the benefits of nasal washing. Review warning signs and symptoms to help prevent infections. Discuss techniques for effective airway clearance, coughing, and vibrations.   AED/CPR: - Group verbal and written instruction with the use of models to demonstrate the basic use of the AED with the basic ABC's of resuscitation.   Pulmonary Rehab from 11/10/2017 in [facility] Cardiac and Pulmonary Rehab  Date  09/10/17  Educator  [facility]  Instruction Review Code  5- Refused Teaching      Anatomy and Physiology of the  Lungs: - Group verbal and written instruction with the use of models to provide basic lung anatomy and physiology related to function, structure and complications of lung disease.   Anatomy & Physiology of the Heart: - Group verbal and written instruction and models provide basic cardiac anatomy and physiology, with the coronary electrical and arterial systems. Review of Valvular disease and Heart Failure   Cardiac Medications: - Group verbal and written instruction to review commonly prescribed medications for heart disease. Reviews the medication, class of the drug, and side effects.   Know Your Numbers and Risk Factors: -Group verbal and written instruction about important numbers in your health.  Discussion of what are risk factors and how they play a role in the disease process.  Review of Cholesterol, Blood Pressure, Diabetes, and BMI and the role they play in your overall health.   Sleep Hygiene: -Provides group verbal and written instruction about how sleep can affect your health.  Define sleep hygiene, discuss sleep cycles and impact of sleep habits. Review good sleep hygiene tips.    Other: -Provides group and verbal instruction on various topics (see comments)    Knowledge Questionnaire Score: Knowledge Questionnaire Score - 08/10/17 1255      Knowledge Questionnaire Score   Pre Score  15/18 Reviewed correct responses with Laynie and she verbalized understanding of the responses.  She had no further questions today.        Core Components/Risk Factors/Patient Goals at Admission: Personal Goals and Risk Factors at Admission - 08/10/17 1248      Core Components/Risk Factors/Patient Goals on Admission    Weight Management  Yes;Obesity    Intervention  Weight Management: Develop a combined nutrition and exercise program designed to reach desired caloric intake, while maintaining appropriate intake of nutrient and fiber, sodium and fats, and appropriate energy expenditure  required for the weight goal.;Weight Management/Obesity: Establish reasonable short term and long term weight goals.;Obesity: Provide education and appropriate  resources to help participant work on and attain dietary goals.    Admit Weight  268 lb 12.8 oz (121.9 kg)    Goal Weight: Short Term  265 lb (120.2 kg)    Goal Weight: Long Term  180 lb (81.6 kg)    Expected Outcomes  Short Term: Continue to assess and modify interventions until short term weight is achieved;Long Term: Adherence to nutrition and physical activity/exercise program aimed toward attainment of established weight goal;Weight Loss: Understanding of general recommendations for a balanced deficit meal plan, which promotes 1-2 lb weight loss per week and includes a negative energy balance of [phone] kcal/d    Improve shortness of breath with ADL's  Yes    Intervention  Provide education, individualized exercise plan and daily activity instruction to help decrease symptoms of SOB with activities of daily living.    Expected Outcomes  Short Term: Improve cardiorespiratory fitness to achieve a reduction of symptoms when performing ADLs;Long Term: Be able to perform more ADLs without symptoms or delay the onset of symptoms    Hypertension  Yes    Intervention  Provide education on lifestyle modifcations including regular physical activity/exercise, weight management, moderate sodium restriction and increased consumption of fresh fruit, vegetables, and low fat dairy, alcohol moderation, and smoking cessation.;Monitor prescription use compliance.    Expected Outcomes  Short Term: Continued assessment and intervention until BP is < 140/61m HG in hypertensive participants. < 130/870mHG in hypertensive participants with diabetes, heart failure or chronic kidney disease.;Long Term: Maintenance of blood pressure at goal levels.    Stress  Yes    Intervention  Offer individual and/or small group education and counseling on adjustment to heart  disease, stress management and health-related lifestyle change. Teach and support self-help strategies.;Refer participants experiencing significant psychosocial distress to appropriate mental health specialists for further evaluation and treatment. When possible, include family members and significant others in education/counseling sessions.    Expected Outcomes  Short Term: Participant demonstrates changes in health-related behavior, relaxation and other stress management skills, ability to obtain effective social support, and compliance with psychotropic medications if prescribed.;Long Term: Emotional wellbeing is indicated by absence of clinically significant psychosocial distress or social isolation.       Core Components/Risk Factors/Patient Goals Review:  Goals and Risk Factor Review    Row Name 09/13/17 1045 10/11/17 1047 11/01/17 1049         Core Components/Risk Factors/Patient Goals Review   Personal Goals Review  Weight Management/Obesity;Hypertension;Stress;Improve shortness of breath with ADL's  Weight Management/Obesity;Hypertension;Improve shortness of breath with ADL's;Stress  Weight Management/Obesity;Hypertension;Improve shortness of breath with ADL's     Review  RoKhaleahtated that she has noticed some improvements in her SOB during daily activities. Her blood pressure as also reported to be under control with diet and exercise/no meds. She stated stress levels are about the same and her weight has also been mantained.   RoJenissaeports taking all meds and BP is reported to be under control. Her weight has been maintained but she still has a goal to lose weight. She has been out due to sickness but has retured and started to ease back into her exercise routine.   RoMonyaas gained some weight since she has been out of [facility]. She has not changed her eating habits and eats when she feels hungry. Her blood pressure has been within normal Limits. Her shortness of breath with her ADLS have  not changed since her attendance has been sporadic.  Expected Outcomes  Short: Continue attending pulmonary rehab regularly and continue to take all meds, short term goal weight 260lbs  Long: Independently continue exercise routine. Long term goal weight 220 lbs.    Short: Attending pulmonary rehab regularly after being out due to sickness and ease back into previous workloads, and continue to take all meds, short term goal weight 260lbs  Long: Independently continue exercise routine. Long term goal weight 220 lbs.    Short: attend [facility] regularly. Long: maintain attending [facility] to improve overall health.        Core Components/Risk Factors/Patient Goals at Discharge (Final Review):  Goals and Risk Factor Review - 11/01/17 1049      Core Components/Risk Factors/Patient Goals Review   Personal Goals Review  Weight Management/Obesity;Hypertension;Improve shortness of breath with ADL's    Review  Ellamay has gained some weight since she has been out of [facility]. She has not changed her eating habits and eats when she feels hungry. Her blood pressure has been within normal Limits. Her shortness of breath with her ADLS have not changed since her attendance has been sporadic.    Expected Outcomes  Short: attend [facility] regularly. Long: maintain attending [facility] to improve overall health.       ITP Comments: ITP Comments    Row Name 08/10/17 1235 08/10/17 1301 08/23/17 0921 09/20/17 0828 09/27/17 1635   ITP Comments  Medical review completed today   ITP sent to Dr Loleta Chance for review, changes as needed and signature.  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   30 day review completed. ITP sent to Dr. Emily Filbert Director of [place]. Continue with ITP unless changes are made by physician.  30 day review completed. ITP sent to Dr. Emily Filbert Director of [facility]. Continue with ITP  unless changes are made by physician.  Left message for Suanne Marker to call back. She has not attended class since 09/17/17.   Row Name 10/06/17 1458 10/18/17 0837 11/15/17 0830       ITP Comments  Ura has no attended since 09/17/17.  I left a VM about returning to class.   30 day review completed. ITP sent to Dr. Emily Filbert Director of [place]. Continue with ITP unless changes are made by physician   30 day review completed. ITP sent to Dr. Emily Filbert Director of [place]. Continue with ITP unless changes are made by physician        Comments: 30 day review

## 2017-11-19 ENCOUNTER — Encounter: Payer: Medicaid Other | Admitting: *Deleted

## 2017-11-19 DIAGNOSIS — J45909 Unspecified asthma, uncomplicated: Secondary | ICD-10-CM | POA: Diagnosis not present

## 2017-11-19 NOTE — Progress Notes (Signed)
Daily Session Note  Patient Details  Name: Lisa [name] MRN: [id number] Date of Birth: [birth year] Referring Provider:     Pulmonary Rehab from 08/10/2017 in [facility] Cardiac and Pulmonary Rehab  Referring Provider  Raul Del      Encounter Date: 11/19/2017  Check In: Session Check In - 11/19/17 1022      Check-In   Location  [facility]-Cardiac & Pulmonary Rehab    Staff Present  Renita Papa, RN Vickki Hearing, BA, ACSM CEP, Exercise Physiologist Constance Goltz RN    Supervising physician immediately available to respond to emergencies  [facility] immediately available ER MD    Physician(s)  Dr. Clearnce Hasten and Alfred Levins    Medication changes reported      No    Fall or balance concerns reported     No    Tobacco Cessation  No Change    Warm-up and Cool-down  Performed as group-led instruction    Resistance Training Performed  Yes    VAD Patient?  No      Pain Assessment   Currently in Pain?  No/denies          Social History   Tobacco Use  Smoking Status Never Smoker  Smokeless Tobacco Never Used    Goals Met:  Proper associated with RPD/PD & O2 Sat Independence with exercise equipment Using PLB without cueing & demonstrates good technique Exercise tolerated well No report of cardiac concerns or symptoms Strength training completed today  Goals Unmet:  Not Applicable  Comments: Pt able to follow exercise prescription today without complaint.  Will continue to monitor for progression.    Dr. Emily Filbert is Medical Director for [place] and [facility].

## 2017-11-22 ENCOUNTER — Encounter: Payer: Medicaid Other | Admitting: *Deleted

## 2017-11-22 DIAGNOSIS — J45909 Unspecified asthma, uncomplicated: Secondary | ICD-10-CM

## 2017-11-22 NOTE — Progress Notes (Signed)
Daily Session Note  Patient Details  Name: Lisa [name] MRN: [id number] Date of Birth: 12/14/1970 Referring Provider:     Pulmonary Rehab from 08/10/2017 in [facility] Cardiac and Pulmonary Rehab  Referring Provider  Raul Del      Encounter Date: 11/22/2017  Check In: Session Check In - 11/22/17 1046      Check-In   Location  [facility]-Cardiac & Pulmonary [facility]    Staff Present  Nyoka Cowden, RN, BSN, [place], BS, ACSM CEP, Exercise Physiologist;Amanda Oletta Darter, [place], ACSM CEP, Exercise Physiologist    Supervising physician immediately available to respond to emergencies  [facility] immediately available ER MD    Physician(s)  Dr. Corky Downs and Dr. Alfred Levins    Medication changes reported      No    Fall or balance concerns reported     No    Tobacco Cessation  No Change    Warm-up and Cool-down  Performed as group-led instruction    Resistance Training Performed  Yes    VAD Patient?  No      Pain Assessment   Currently in Pain?  No/denies    Multiple Pain Sites  No          Social History   Tobacco Use  Smoking Status Never Smoker  Smokeless Tobacco Never Used    Goals Met:  Proper associated with RPD/PD & O2 Sat Independence with exercise equipment Exercise tolerated well No report of cardiac concerns or symptoms Strength training completed today  Goals Unmet:  Not Applicable  Comments: Pt able to follow exercise prescription today without complaint.  Will continue to monitor for progression.    Dr. Emily Filbert is Medical Director for [place] and [facility].

## 2017-11-26 ENCOUNTER — Encounter: Payer: Medicaid Other | Admitting: *Deleted

## 2017-11-26 DIAGNOSIS — J45909 Unspecified asthma, uncomplicated: Secondary | ICD-10-CM | POA: Diagnosis not present

## 2017-11-26 NOTE — Progress Notes (Signed)
Daily Session Note  Patient Details  Name: Lisa [name] MRN: [id number] Date of Birth: 25-Sep-1970 Referring Provider:     Pulmonary Rehab from 08/10/2017 in [facility] Cardiac and Pulmonary Rehab  Referring Provider  Raul Del      Encounter Date: 11/26/2017  Check In: Session Check In - 11/26/17 1024      Check-In   Location  [facility]-Cardiac & Pulmonary Rehab    Staff Present  Carson Myrtle, BS, RRT, Respiratory Therapist;Braylie Badami Sherryll Burger, RN Vickki Hearing, BA, ACSM CEP, Exercise Physiologist    Supervising physician immediately available to respond to emergencies  [facility] immediately available ER MD    Physician(s)  Dr. Joni Fears and Corky Downs    Medication changes reported      No    Fall or balance concerns reported     No    Tobacco Cessation  No Change    Warm-up and Cool-down  Performed as group-led instruction    Resistance Training Performed  Yes    VAD Patient?  No      Pain Assessment   Currently in Pain?  No/denies          Social History   Tobacco Use  Smoking Status Never Smoker  Smokeless Tobacco Never Used    Goals Met:  Proper associated with RPD/PD & O2 Sat Independence with exercise equipment Using PLB without cueing & demonstrates good technique Exercise tolerated well No report of cardiac concerns or symptoms Strength training completed today  Goals Unmet:  Not Applicable  Comments: Pt able to follow exercise prescription today without complaint.  Will continue to monitor for progression.    Dr. Emily Filbert is Medical Director for [place] and [facility].

## 2017-12-01 ENCOUNTER — Encounter: Payer: Medicaid Other | Admitting: *Deleted

## 2017-12-01 DIAGNOSIS — J45909 Unspecified asthma, uncomplicated: Secondary | ICD-10-CM

## 2017-12-01 NOTE — Progress Notes (Signed)
Daily Session Note  Patient Details  Name: Lisa [name] MRN: [id number] Date of Birth: 11-13-1970 Referring Provider:     Pulmonary Rehab from 08/10/2017 in [facility] Cardiac and Pulmonary Rehab  Referring Provider  Raul Del      Encounter Date: 12/01/2017  Check In: Session Check In - 12/01/17 1054      Check-In   Location  [facility] Pul[facility] Re[facility]    Staff Present  Alberteen Sam, MA, RCEP, CCRP, Exercise Physiologist;Tangala Wiegert Sherryll Burger, RN Vickki Hearing, BA, ACSM CEP, Exercise Physiologist    Supervising physician immediately available to respond to emergencies  [facility] immediately available ER MD    Physician(s)   Dr. Mariea Clonts and Alfred Levins     Medication changes reported      No    Fall or balance concerns reported     No    Tobacco Cessation  No Change    Warm-up and Cool-down  Performed as group-led instruction    Resistance Training Performed  Yes    VAD Patient?  No      Pain Assessment   Currently in Pain?  No/denies          Social History   Tobacco Use  Smoking Status Never Smoker  Smokeless Tobacco Never Used    Goals Met:  Proper associated with RPD/PD & O2 Sat Independence with exercise equipment Using PLB without cueing & demonstrates good technique Exercise tolerated well No report of cardiac concerns or symptoms Strength training completed today  Goals Unmet:  Not Applicable  Comments: Pt able to follow exercise prescription today without complaint.  Will continue to monitor for progression.      Dr. Emily Filbert is Medical Director for [place] and [facility].

## 2017-12-13 DIAGNOSIS — J45909 Unspecified asthma, uncomplicated: Secondary | ICD-10-CM

## 2017-12-13 NOTE — Progress Notes (Signed)
Pulmonary Individual Treatment Plan  Patient Details  Name: Lisa [name] MRN: [id number] Date of Birth: 04/02/1971 Referring Provider:     Pulmonary Rehab from 08/10/2017 in [facility] Cardiac and Pulmonary Rehab  Referring Provider  Raul Del      Initial Encounter Date:    Pulmonary Rehab from 08/10/2017 in [facility] Cardiac and Pulmonary Rehab  Date  08/10/17  Referring Provider  Raul Del      Visit Diagnosis: Uncomplicated asthma, unspecified asthma severity, unspecified whether persistent  Patient's Home Medications on Admission:  Current Outpatient Medications:  .  albuterol (PROVENTIL HFA;VENTOLIN HFA) 108 (90 Base) MCG/ACT inhaler, Inhale 2 puffs into the lungs every 6 (six) hours as needed for wheezing or shortness of breath., Disp: 1 Inhaler, Rfl: 2 .  azaTHIOprine (IMURAN) 50 MG tablet, Take by mouth., Disp: , Rfl:  .  ferrous sulfate 325 (65 FE) MG tablet, Take 325 mg by mouth daily with breakfast., Disp: , Rfl:  .  gabapentin (NEURONTIN) 100 MG capsule, Take by mouth., Disp: , Rfl:  .  Ipratropium-Albuterol (COMBIVENT) 20-100 MCG/ACT AERS respimat, Inhale 1 puff into the lungs every 6 (six) hours., Disp: , Rfl:  .  montelukast (SINGULAIR) 10 MG tablet, Take by mouth., Disp: , Rfl:  .  omeprazole (PRILOSEC) 40 MG capsule, Take by mouth., Disp: , Rfl:  .  phentermine 37.5 MG capsule, Take by mouth., Disp: , Rfl:  .  predniSONE (DELTASONE) 10 MG tablet, Take 20 mg by mouth daily., Disp: , Rfl:   Past Medical History: Past Medical History:  Diagnosis Date  . Abdominal pain   . Anemia    hx of anemic  . Asthma   . Bipolar 1 disorder ([place])   . Blood in stool   . Change in voice   . Chest pain   . Constipation   . Depression   . Diverticulitis   . DVT (deep venous thrombosis) ([place]) 12/13/2014  . Dysrhythmia   . Fatigue   . Gallstones 04/10/2011   with biliary pancreatitis  . GERD (gastroesophageal reflux disease)   . Migraines   . MRSA carrier   . Nausea   .  Pancreatitis   . PTSD (post-traumatic stress disorder)   . Pulmonary embolism ([place]) 12/13/2014  . Sleep apnea    cpap  . Trouble swallowing     Tobacco Use: Social History   Tobacco Use  Smoking Status Never Smoker  Smokeless Tobacco Never Used    Labs: Recent Review Scientist, physiological    Labs for ITP Cardiac and Pulmonary Rehab Latest Ref Rng & Units 03/18/2014   Hemoglobin A1c 4.2 - 6.3 % 5.6       Pulmonary Assessment Scores: Pulmonary Assessment Scores    Row Name 08/10/17 1257         ADL UCSD   ADL Phase  Entry     SOB Score total  98     Rest  2     Walk  4     Stairs  5     Bath  4     Dress  3     Shop  4       CAT Score   CAT Score  39       mMRC Score   mMRC Score  2        Pulmonary Function Assessment: Pulmonary Function Assessment - 08/10/17 1256      Breath   Shortness of Breath  Fear of Shortness of Breath;Yes;Limiting activity  Exercise Target Goals:    Exercise Program Goal: Individual exercise prescription set using results from initial 6 min walk test and THRR while considering  patient's activity barriers and safety.    Exercise Prescription Goal: Initial exercise prescription builds to 30-45 minutes a day of aerobic activity, 2-3 days per week.  Home exercise guidelines will be given to patient during program as part of exercise prescription that the participant will acknowledge.  Activity Barriers & Risk Stratification: Activity Barriers & Cardiac Risk Stratification - 08/10/17 1247      Activity Barriers & Cardiac Risk Stratification   Activity Barriers  Back Problems;Shortness of Breath       6 Minute Walk: 6 Minute Walk    Row Name 08/10/17 1318         6 Minute Walk   Phase  Initial     Distance  1250 feet     Walk Time  6 minutes     # of Rest Breaks  0     MPH  2.37     METS  4.13     RPE  19     Perceived Dyspnea   4     VO2 Peak  14.47     Symptoms  Yes (comment)     Comments  back pain 7/10      Resting HR  77 bpm     Resting BP  126/76     Resting Oxygen Saturation   98 %     Exercise Oxygen Saturation  during 6 min walk  91 %     Max Ex. HR  138 bpm     Max Ex. BP  162/86     2 Minute Post BP  138/84       Interval HR   1 Minute HR  112     2 Minute HR  133     3 Minute HR  136     4 Minute HR  134     5 Minute HR  138     6 Minute HR  115     2 Minute Post HR  91     Interval Heart Rate?  Yes       Interval Oxygen   Interval Oxygen?  Yes     Baseline Oxygen Saturation %  98 %     1 Minute Liters of Oxygen  0 L     2 Minute Oxygen Saturation %  91 %     2 Minute Liters of Oxygen  0 L     3 Minute Oxygen Saturation %  94 %     3 Minute Liters of Oxygen  0 L     4 Minute Oxygen Saturation %  95 %     4 Minute Liters of Oxygen  0 L     5 Minute Oxygen Saturation %  94 %     5 Minute Liters of Oxygen  0 L     6 Minute Oxygen Saturation %  95 %     6 Minute Liters of Oxygen  0 L     2 Minute Post Oxygen Saturation %  100 %     2 Minute Post Liters of Oxygen  0 L       Oxygen Initial Assessment: Oxygen Initial Assessment - 08/10/17 1238      Home Oxygen   Home Oxygen Device  Home Concentrator    Sleep Oxygen Prescription  Continuous;CPAP  Liters per minute  2    Home Exercise Oxygen Prescription  None    Home at Rest Exercise Oxygen Prescription  None    Compliance with Home Oxygen Use  Yes      Initial 6 min Walk   Oxygen Used  None      Program Oxygen Prescription   Program Oxygen Prescription  None      Intervention   Short Term Goals  To learn and exhibit compliance with exercise, home and travel O2 prescription;To learn and understand importance of maintaining oxygen saturations>88%;To learn and demonstrate proper use of respiratory medications;To learn and understand importance of monitoring SPO2 with pulse oximeter and demonstrate accurate use of the pulse oximeter.;To learn and demonstrate proper pursed lip breathing techniques or other breathing  techniques.    Long  Term Goals  Exhibits compliance with exercise, home and travel O2 prescription;Verbalizes importance of monitoring SPO2 with pulse oximeter and return demonstration;Maintenance of O2 saturations>88%;Exhibits proper breathing techniques, such as pursed lip breathing or other method taught during program session;Compliance with respiratory medication;Demonstrates proper use of MDI's       Oxygen Re-Evaluation: Oxygen Re-Evaluation    Row Name 08/16/17 1029 09/13/17 1105 10/11/17 1051 11/01/17 1035 11/26/17 1138     Program Oxygen Prescription   Program Oxygen Prescription  -  None  None  None  None     Home Oxygen   Home Oxygen Device  -  Home Concentrator  Home Concentrator  Home Concentrator  Home Concentrator   Sleep Oxygen Prescription  -  Continuous  Continuous  Continuous  Continuous   Liters per minute  -  _0 Home Exercise Oxygen Prescription  -  None  None  None  None   Home at Rest Exercise Oxygen Prescription  -  None  None  None  None   Compliance with Home Oxygen Use  -  Yes  Yes  Yes  Yes     Goals/Expected Outcomes   Short Term Goals  To learn and exhibit compliance with exercise, home and travel O2 prescription;To learn and understand importance of maintaining oxygen saturations>88%;To learn and demonstrate proper use of respiratory medications;To learn and understand importance of monitoring SPO2 with pulse oximeter and demonstrate accurate use of the pulse oximeter.;To learn and demonstrate proper pursed lip breathing techniques or other breathing techniques.  To learn and exhibit compliance with exercise, home and travel O2 prescription;To learn and understand importance of maintaining oxygen saturations>88%;To learn and demonstrate proper use of respiratory medications;To learn and understand importance of monitoring SPO2 with pulse oximeter and demonstrate accurate use of the pulse oximeter.;To learn and demonstrate proper pursed lip breathing  techniques or other breathing techniques.  To learn and exhibit compliance with exercise, home and travel O2 prescription;To learn and understand importance of maintaining oxygen saturations>88%;To learn and demonstrate proper use of respiratory medications;To learn and understand importance of monitoring SPO2 with pulse oximeter and demonstrate accurate use of the pulse oximeter.;To learn and demonstrate proper pursed lip breathing techniques or other breathing techniques.  To learn and exhibit compliance with exercise, home and travel O2 prescription;To learn and understand importance of maintaining oxygen saturations>88%;To learn and demonstrate proper use of respiratory medications;To learn and understand importance of monitoring SPO2 with pulse oximeter and demonstrate accurate use of the pulse oximeter.;To learn and demonstrate proper pursed lip breathing techniques or other breathing techniques.  To learn and exhibit compliance with exercise, home and travel  O2 prescription;To learn and understand importance of maintaining oxygen saturations>88%;To learn and demonstrate proper use of respiratory medications;To learn and understand importance of monitoring SPO2 with pulse oximeter and demonstrate accurate use of the pulse oximeter.;To learn and demonstrate proper pursed lip breathing techniques or other breathing techniques.   Long  Term Goals  Exhibits compliance with exercise, home and travel O2 prescription;Verbalizes importance of monitoring SPO2 with pulse oximeter and return demonstration;Maintenance of O2 saturations>88%;Exhibits proper breathing techniques, such as pursed lip breathing or other method taught during program session;Compliance with respiratory medication;Demonstrates proper use of MDI's  Exhibits compliance with exercise, home and travel O2 prescription;Verbalizes importance of monitoring SPO2 with pulse oximeter and return demonstration;Maintenance of O2 saturations>88%;Exhibits  proper breathing techniques, such as pursed lip breathing or other method taught during program session;Compliance with respiratory medication;Demonstrates proper use of MDI's  Exhibits compliance with exercise, home and travel O2 prescription;Verbalizes importance of monitoring SPO2 with pulse oximeter and return demonstration;Maintenance of O2 saturations>88%;Exhibits proper breathing techniques, such as pursed lip breathing or other method taught during program session;Compliance with respiratory medication;Demonstrates proper use of MDI's  Exhibits compliance with exercise, home and travel O2 prescription;Verbalizes importance of monitoring SPO2 with pulse oximeter and return demonstration;Maintenance of O2 saturations>88%;Exhibits proper breathing techniques, such as pursed lip breathing or other method taught during program session;Compliance with respiratory medication;Demonstrates proper use of MDI's  Exhibits compliance with exercise, home and travel O2 prescription;Verbalizes importance of monitoring SPO2 with pulse oximeter and return demonstration;Maintenance of O2 saturations>88%;Exhibits proper breathing techniques, such as pursed lip breathing or other method taught during program session;Compliance with respiratory medication;Demonstrates proper use of MDI's   Comments  Reviewed PLB technique with pt.  Talked about how it work and it's important to maintaining his exercise saturations.  Terilynn stated that she does use PLB at home to help control SOB, and has noticed some improvements in SOB. She does not currently have a pulse oximeter or monitor her oxygen saturations. She was advised to purchase a pulse ox and use it at home.   Reannon had been out sick and her SOB was worse duing her sickness, but has now improved since getting better and back to class. She did not yet buy a pulse oximeter to monitor her oxygen at home but was encouraged to do so again.   Jennipher is taking predinisone, combivent  and albuterol as prescribed. She has no questions about her medicatons. She uses her PLB when she gets short of breath in class and at home. She has not obtained a pulse oximeter yet.  Her oxygen has been above 88 percent on all her exercises and at rest.   Tayona is taking her medications appropriately. She is still on prednisone. Staff encouraged her to be mindful about potential side effects that affect her appetite and mood. Her oxygen is stable while in class.    Goals/Expected Outcomes  Short: Become more profiecient at using PLB.   Long: Become independent at using PLB.  Short: Purchase a pulse oxymeter and start monitoring oxygen saturations at home. Long: Become independent monitoring oxygen saturations and using PLB at home.   Short: Purchase a pulse oxymeter and start monitoring oxygen saturations at home. Long: Become independent monitoring oxygen saturations and using PLB at home.   Short: continue to exercise to reduce oxygen need. Long: maintain exercise to keep oxygen needs above 88 percent.  Short: continue to exercise to help with SOB and oxygen need, keep track of side effects of respiratory medications  to monitor a trend in adverse reactions. Long: become indpendent with managing oxygen needs and continue to manage her medications independently       Oxygen Discharge (Final Oxygen Re-Evaluation): Oxygen Re-Evaluation - 11/26/17 1138      Program Oxygen Prescription   Program Oxygen Prescription  None      Home Oxygen   Home Oxygen Device  Home Concentrator    Sleep Oxygen Prescription  Continuous    Liters per minute  2    Home Exercise Oxygen Prescription  None    Home at Rest Exercise Oxygen Prescription  None    Compliance with Home Oxygen Use  Yes      Goals/Expected Outcomes   Short Term Goals  To learn and exhibit compliance with exercise, home and travel O2 prescription;To learn and understand importance of maintaining oxygen saturations>88%;To learn and demonstrate  proper use of respiratory medications;To learn and understand importance of monitoring SPO2 with pulse oximeter and demonstrate accurate use of the pulse oximeter.;To learn and demonstrate proper pursed lip breathing techniques or other breathing techniques.    Long  Term Goals  Exhibits compliance with exercise, home and travel O2 prescription;Verbalizes importance of monitoring SPO2 with pulse oximeter and return demonstration;Maintenance of O2 saturations>88%;Exhibits proper breathing techniques, such as pursed lip breathing or other method taught during program session;Compliance with respiratory medication;Demonstrates proper use of MDI's    Comments  Shannie is taking her medications appropriately. She is still on prednisone. Staff encouraged her to be mindful about potential side effects that affect her appetite and mood. Her oxygen is stable while in class.     Goals/Expected Outcomes  Short: continue to exercise to help with SOB and oxygen need, keep track of side effects of respiratory medications to monitor a trend in adverse reactions. Long: become indpendent with managing oxygen needs and continue to manage her medications independently        Initial Exercise Prescription: Initial Exercise Prescription - 08/10/17 1300      Date of Initial Exercise RX and Referring Provider   Date  08/10/17    Referring Provider  Raul Del      Treadmill   MPH  2.3    Grade  0.5    Minutes  15    METs  2.92      NuStep   Level  2    SPM  80    Minutes  15    METs  3      REL-XR   Level  2    Speed  50    Minutes  15    METs  3      Biostep-RELP   Level  2    SPM  50    Minutes  15    METs  3      Prescription Details   Frequency (times per week)  3    Duration  Progress to 45 minutes of aerobic exercise without signs/symptoms of physical distress      Intensity   THRR 40-80% of Max Heartrate  116-156    Ratings of Perceived Exertion  11-13    Perceived Dyspnea  0-4       Resistance Training   Training Prescription  Yes    Weight  3 lb    Reps  10-15       Perform Capillary Blood Glucose checks as needed.  Exercise Prescription Changes: Exercise Prescription Changes    Row Name 08/25/17 1100 08/25/17 1300 09/10/17  0800 10/20/17 1400 11/03/17 1200     Response to Exercise   Blood Pressure (Admit)  -  146/84  106/64  132/80  122/80   Blood Pressure (Exercise)  -  128/64  -  -  -   Blood Pressure (Exit)  -  126/84  132/82  126/78  132/78   Heart Rate (Admit)  -  82 bpm  102 bpm  115 bpm  115 bpm   Heart Rate (Exercise)  -  109 bpm  132 bpm  138 bpm  128 bpm   Heart Rate (Exit)  -  82 bpm  98 bpm  120 bpm  96 bpm   Oxygen Saturation (Admit)  -  99 %  98 %  97 %  95 %   Oxygen Saturation (Exercise)  -  94 %  94 %  91 %  92 %   Oxygen Saturation (Exit)  -  97 %  98 %  98 %  98 %   Rating of Perceived Exertion (Exercise)  -  _0 Perceived Dyspnea (Exercise)  -  _1 Symptoms  -  none  none  non2  none   Duration  -  Continue with 45 min of aerobic exercise without signs/symptoms of physical distress.  Continue with 45 min of aerobic exercise without signs/symptoms of physical distress.  Continue with 45 min of aerobic exercise without signs/symptoms of physical distress.  Continue with 45 min of aerobic exercise without signs/symptoms of physical distress.   Intensity  -  THRR unchanged  THRR unchanged  THRR unchanged  THRR unchanged     Progression   Progression  -  Continue to progress workloads to maintain intensity without signs/symptoms of physical distress.  Continue to progress workloads to maintain intensity without signs/symptoms of physical distress.  Continue to progress workloads to maintain intensity without signs/symptoms of physical distress.  Continue to progress workloads to maintain intensity without signs/symptoms of physical distress.   Average METs  -  2.7  3.3  2.94  2.8     Resistance Training   Training  Prescription  -  Yes  Yes  Yes  Yes   Weight  -  3 lb  3 lb  3 lb  3 lb   Reps  -  10-15  10-15  10-15  10-15     Interval Training   Interval Training  -  No  No  -  No     Treadmill   MPH  -  2.3  2.3  2.3  2.3   Grade  -  0.5  0.5  0.5  0.5   Minutes  -  _2 METs  -  2.92  2.92  2.92  2.92     NuStep   Level  -  2  -  3  3   SPM  -  80  -  80  80   Minutes  -  15  -  15  15   METs  -  2.5  -  2.8  3     REL-XR   Level  -  -  _3 Speed  -  -  71  -  -   Minutes  -  -  _4 METs  -  -  3.7  3.1  2.4     Home Exercise Plan   Plans to continue exercise at  [employer] (comment) [place] (comment) [employer] (comment) [employer] (comment[employer]   Frequency  Add 1 additional day to program exercise sessions.  -  Add 1 additional day to program exercise sessions.  Add 1 additional day to program exercise sessions.  Add 1 additional day to program exercise sessions.   Initial Home Exercises Provided  08/25/17  -  08/25/17  08/25/17  08/25/17   Row Name 11/17/17 1500 12/01/17 1200           Response to Exercise   Blood Pressure (Admit)  114/74  134/60      Blood Pressure (Exit)  130/74  138/84      Heart Rate (Admit)  104 bpm  125 bpm      Heart Rate (Exercise)  114 bpm  137 bpm      Heart Rate (Exit)  72 bpm  84 bpm      Oxygen Saturation (Admit)  96 %  95 %      Oxygen Saturation (Exercise)  95 %  94 %      Oxygen Saturation (Exit)  98 %  98 %      Rating of Perceived Exertion (Exercise)  14  13      Perceived Dyspnea (Exercise)  3  2      Symptoms  none  none      Duration  Continue with 45 min of aerobic exercise without signs/symptoms of physical distress.  Continue with 45 min of aerobic exercise without signs/symptoms of physical distress.      Intensity  THRR unchanged  THRR unchanged        Progression   Progression  Continue to progress workloads to maintain intensity without  signs/symptoms of physical distress.  Continue to progress workloads to maintain intensity without signs/symptoms of physical distress.      Average METs  3.6  3.3        Resistance Training   Training Prescription  Yes  Yes      Weight  3 lb  3 lb      Reps  10-15  10-15        Interval Training   Interval Training  No  No        Treadmill   MPH  2.3  -      Grade  0.5  -      Minutes  15  -      METs  2.92  -        NuStep   Level  3  4      SPM  88  110      Minutes  15  15      METs  3.1  3.2        REL-XR   Level  5  5      Speed  60  65      Minutes  15  15      METs  4.7  3.4        Home Exercise Plan   Plans to continue exercise at  [employer] (comment) Gym  -      Frequency  Add 1 additional day to program exercise sessions.  -      Initial Home Exercises Provided  08/25/17  -  Exercise Comments: Exercise Comments    Row Name 08/16/17 1028           Exercise Comments  First full day of exercise!  Patient was oriented to gym and equipment including functions, settings, policies, and procedures.  Patient's individual exercise prescription and treatment plan were reviewed.  All starting workloads were established based on the results of the 6 minute walk test done at initial orientation visit.  The plan for exercise progression was also introduced and progression will be customized based on patient's performance and goals.          Exercise Goals and Review: Exercise Goals    Row Name 08/10/17 1314             Exercise Goals   Increase Physical Activity  Yes       Intervention  Provide advice, education, support and counseling about physical activity/exercise needs.;Develop an individualized exercise prescription for aerobic and resistive training based on initial evaluation findings, risk stratification, comorbidities and participant's personal goals.       Expected Outcomes  Short Term: Attend rehab on a regular basis to increase amount  of physical activity.;Long Term: Exercising regularly at least 3-5 days a week.       Increase Strength and Stamina  Yes       Intervention  Provide advice, education, support and counseling about physical activity/exercise needs.;Develop an individualized exercise prescription for aerobic and resistive training based on initial evaluation findings, risk stratification, comorbidities and participant's personal goals.       Expected Outcomes  Short Term: Increase workloads from initial exercise prescription for resistance, speed, and METs.;Long Term: Improve cardiorespiratory fitness, muscular endurance and strength as measured by increased METs and functional capacity (6MWT);Short Term: Perform resistance training exercises routinely during rehab and add in resistance training at home       Able to understand and use rate of perceived exertion (RPE) scale  Yes       Intervention  Provide education and explanation on how to use RPE scale       Expected Outcomes  Short Term: Able to use RPE daily in rehab to express subjective intensity level;Long Term:  Able to use RPE to guide intensity level when exercising independently       Able to understand and use Dyspnea scale  Yes       Intervention  Provide education and explanation on how to use Dyspnea scale       Expected Outcomes  Short Term: Able to use Dyspnea scale daily in rehab to express subjective sense of shortness of breath during exertion;Long Term: Able to use Dyspnea scale to guide intensity level when exercising independently       Knowledge and understanding of Target Heart Rate Range (THRR)  Yes       Intervention  Provide education and explanation of THRR including how the numbers were predicted and where they are located for reference       Expected Outcomes  Short Term: Able to state/look up THRR;Short Term: Able to use daily as guideline for intensity in rehab;Long Term: Able to use THRR to govern intensity when exercising independently        Able to check pulse independently  Yes       Intervention  Provide education and demonstration on how to check pulse in carotid and radial arteries.;Review the importance of being able to check your own pulse for safety during independent exercise  Expected Outcomes  Short Term: Able to explain why pulse checking is important during independent exercise;Long Term: Able to check pulse independently and accurately       Understanding of Exercise Prescription  Yes       Intervention  Provide education, explanation, and written materials on patient's individual exercise prescription       Expected Outcomes  Short Term: Able to explain program exercise prescription;Long Term: Able to explain home exercise prescription to exercise independently          Exercise Goals Re-Evaluation : Exercise Goals Re-Evaluation    Row Name 08/16/17 1028 08/25/17 1138 09/10/17 0814 10/11/17 1103 10/20/17 1456     Exercise Goal Re-Evaluation   Exercise Goals Review  Understanding of Exercise Prescription;Able to understand and use Dyspnea scale;Knowledge and understanding of Target Heart Rate Range (THRR);Able to understand and use rate of perceived exertion (RPE) scale  Increase Physical Activity;Increase Strength and Stamina  Increase Physical Activity;Able to understand and use rate of perceived exertion (RPE) scale;Increase Strength and Stamina;Able to understand and use Dyspnea scale  Increase Physical Activity;Able to understand and use rate of perceived exertion (RPE) scale;Increase Strength and Stamina;Able to understand and use Dyspnea scale  Increase Physical Activity;Able to understand and use rate of perceived exertion (RPE) scale;Increase Strength and Stamina;Able to understand and use Dyspnea scale   Comments  Reviewed RPE scale, THR and program prescription with pt today.  Pt voiced understanding and was given a copy of goals to take home.   Reviewed home exercise with pt today.  Pt plans to go the  gym 1 extra day for exercise.  Reviewed THR, pulse, RPE, sign and symptoms, NTG use, and when to call 911 or MD.  Also discussed weather considerations and indoor options.  Pt voiced understanding.  Pt voiced understanding.  Zaleigh is tolerating exercise well.  She has increased level on the XR and reaches her THR range.  Staff will continue to monitor.  Has been out sick and just returned to class. Working back up to previous workolads.   Zeta tolerates exercise well.  She would benefit from regular attendance 3 times per week.   Expected Outcomes  Short: Use RPE daily to regulate intensity.  Long: Follow program prescription in THR.  Short: add 1 extra day of exercise. Long: maintain and add more days to home exercise.  Short - Soul will contiinue to attend three times per week Long - Ashla will increase overall MET level  Short: Return to previous workloads prior to being sick and become consistant with attendance since she is no longer sick. Long: Chace will increase overall MET level and be consistant with her exercise to help in weight loss.   Short - Jetta will attend regularly Long - Sharayah will maintain exercise on her own   [place] Name 11/03/17 1246 11/17/17 1519 12/01/17 1204         Exercise Goal Re-Evaluation   Exercise Goals Review  Increase Physical Activity;Able to understand and use rate of perceived exertion (RPE) scale;Increase Strength and Stamina;Able to understand and use Dyspnea scale  Increase Physical Activity;Able to understand and use rate of perceived exertion (RPE) scale;Increase Strength and Stamina;Able to understand and use Dyspnea scale  Increase Physical Activity;Able to understand and use rate of perceived exertion (RPE) scale;Knowledge and understanding of Target Heart Rate Range (THRR);Increase Strength and Stamina;Able to understand and use Dyspnea scale     Comments  Maryalyce has attended only 3 sessions in April so  far.  She will see more progress with regular attendance.   Alsie would progress more with regular attendance  Suanne Marker has increased resistance on NS since last review.  Staff will monitor progress.     Expected Outcomes  Short - Taeler will attend class on a consistent basis 3 times per week Long - Aryanne will imporve overall MET level  Alba will attend consistently 2-3 times per week Long - Pt will maintain exercise on her own  Short - Suanne Marker will exercise at home on days she doesnt attend class Long Suanne Marker will exercise independently        Discharge Exercise Prescription (Final Exercise Prescription Changes): Exercise Prescription Changes - 12/01/17 1200      Response to Exercise   Blood Pressure (Admit)  134/60    Blood Pressure (Exit)  138/84    Heart Rate (Admit)  125 bpm    Heart Rate (Exercise)  137 bpm    Heart Rate (Exit)  84 bpm    Oxygen Saturation (Admit)  95 %    Oxygen Saturation (Exercise)  94 %    Oxygen Saturation (Exit)  98 %    Rating of Perceived Exertion (Exercise)  13    Perceived Dyspnea (Exercise)  2    Symptoms  none    Duration  Continue with 45 min of aerobic exercise without signs/symptoms of physical distress.    Intensity  THRR unchanged      Progression   Progression  Continue to progress workloads to maintain intensity without signs/symptoms of physical distress.    Average METs  3.3      Resistance Training   Training Prescription  Yes    Weight  3 lb    Reps  10-15      Interval Training   Interval Training  No      NuStep   Level  4    SPM  110    Minutes  15    METs  3.2      REL-XR   Level  5    Speed  65    Minutes  15    METs  3.4       Nutrition:  Target Goals: Understanding of nutrition guidelines, daily intake of sodium <1566m, cholesterol <2090m calories 30% from fat and 7% or less from saturated fats, daily to have 5 or more servings of fruits and vegetables.  Biometrics: Pre Biometrics - 08/10/17 1313      Pre Biometrics   Height  _0  (1.727 m)    Weight  268  lb 12.8 oz (121.9 kg)    Waist Circumference  42 inches    Hip Circumference  44.5 inches    Waist to Hip Ratio  0.94 %    BMI (Calculated)  40.88        Nutrition Therapy Plan and Nutrition Goals: Nutrition Therapy & Goals - 09/13/17 1059      Intervention Plan   Expected Outcomes  Short Term Goal: Understand basic principles of dietary content, such as calories, fat, sodium, cholesterol and nutrients.;Short Term Goal: A plan has been developed with personal nutrition goals set during dietitian appointment.;Long Term Goal: Adherence to prescribed nutrition plan.       Nutrition Assessments: Nutrition Assessments - 08/10/17 1247      MEDFICTS Scores   Pre Score  46       Nutrition Goals Re-Evaluation: Nutrition Goals Re-Evaluation    [place] 09/13/17 1100  10/11/17 1056 11/01/17 1039 11/26/17 1132       Goals   Current Weight  269 lb (122 kg)  272 lb (123.4 kg)  273 lb 11.2 oz (124.1 kg)  266 lb (120.7 kg)    Nutrition Goal  Look for dietary sources of Vitamin B12 such as egg yolks, mushrooms, nutritional yeast  -  lose weight, Have more energy.  Lose weight    Comment  Aubrielle met with the dietician and set initial nutrition goals that she stated she is already following much of.   Ivan stated that she is eating when she feels like it and is trying to follow healthy eating guidelines.   She states that her dietician appointment was pointless. She has not had an appitite this morning and did not eat breakfast.  Danise was encouraged to come to the General Nutrition class coming up next week in [facility] to see if it is more helpful. She still has no appetite, but is working on trying to snack appropriately     Expected Outcome  Short Term Goal: Understand basic principles of dietary content, such as calories, fat, sodium, cholesterol and nutrients.;Short Term Goal: A plan has been developed with personal nutrition goals set during dietitian appointment.;Long Term Goal: Adherence to  prescribed nutrition plan.;  -  Short: make better eating choices to lose weight. Long: lose weight and maintain weight loss.  Short: attend General Nutrition class. long: lose weight to get to goal weight and maintain it.       Personal Goal #2 Re-Evaluation   Personal Goal #2  Work on incorporating a "mini" meal or snack during the first part of the day to help get you on a more regular eating schedule; aim for 200kcal extra per day to start  Short: get into consistance healthy eating pattern.  weight goal of 260 lbs  Long: Goal weight 220 lbs.   -  -       Nutrition Goals Discharge (Final Nutrition Goals Re-Evaluation): Nutrition Goals Re-Evaluation - 11/26/17 1132      Goals   Current Weight  266 lb (120.7 kg)    Nutrition Goal  Lose weight    Comment  Lysbeth was encouraged to come to the General Nutrition class coming up next week in [facility] to see if it is more helpful. She still has no appetite, but is working on trying to snack appropriately     Expected Outcome  Short: attend General Nutrition class. long: lose weight to get to goal weight and maintain it.        Psychosocial: Target Goals: Acknowledge presence or absence of significant depression and/or stress, maximize coping skills, provide positive support system. Participant is able to verbalize types and ability to use techniques and skills needed for reducing stress and depression.   Initial Review & Psychosocial Screening: Initial Psych Review & Screening - 08/10/17 1249      Initial Review   Current issues with  Current Depression;History of Depression;Current Sleep Concerns;Current Stress Concerns    Source of Stress Concerns  Chronic Illness;Family;Transportation;Financial;Unable to perform yard/household activities;Unable to participate in former interests or hobbies    Comments  Currently sleeping only 2-4 hours. Waking up from coughing and not able to immediately go back to sleep. Applied for disability and was  denied, now working with a Chief Executive Officer to reapply for disability benefits. Not able to manage any ADL's without needing to stop frequently because so short of breath  Family Dynamics   Good Support System?  Yes      Barriers   Psychosocial barriers to participate in program  There are no identifiable barriers or psychosocial needs.;The patient should benefit from training in stress management and relaxation.      Screening Interventions   Interventions  Encouraged to exercise;Provide feedback about the scores to participant;Program counselor consult;To provide support and resources with identified psychosocial needs    Expected Outcomes  Short Term goal: Utilizing psychosocial counselor, staff and physician to assist with identification of specific Stressors or current issues interfering with healing process. Setting desired goal for each stressor or current issue identified.;Long Term Goal: Stressors or current issues are controlled or eliminated.;Short Term goal: Identification and review with participant of any Quality of Life or Depression concerns found by scoring the questionnaire.;Long Term goal: The participant improves quality of Life and PHQ9 Scores as seen by post scores and/or verbalization of changes       Quality of Life Scores:  Scores of 19 and below usually indicate a poorer quality of life in these areas.  A difference of  2-3 points is a clinically meaningful difference.  A difference of 2-3 points in the total score of the Quality of Life Index has been associated with significant improvement in overall quality of life, self-image, physical symptoms, and general health in studies assessing change in quality of life.  PHQ-9: Recent Review Flowsheet Data    Depression screen [facility] 2/9 08/10/2017   Decreased Interest 3   Down, Depressed, Hopeless 2   PHQ - 2 Score 5   Altered sleeping 3   Tired, decreased energy 3   Change in appetite 3   Feeling bad or failure about yourself   2   Trouble concentrating 3   Moving slowly or fidgety/restless 3   Suicidal thoughts 0   PHQ-9 Score 22   Difficult doing work/chores Extremely dIfficult     Interpretation of Total Score  Total Score Depression Severity:  1-4 = Minimal depression, 5-9 = Mild depression, 10-14 = Moderate depression, 15-19 = Moderately severe depression, 20-27 = Severe depression   Psychosocial Evaluation and Intervention: Psychosocial Evaluation - 08/18/17 1133      Psychosocial Evaluation & Interventions   Interventions  Encouraged to exercise with the program and follow exercise prescription;Stress management education;Relaxation education;Therapist referral    Comments  Counselor met with Rosalia today for initial psychsocial evaluation.  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  She has a strong support system with family close by and (3) children in the home - (1) who is 60 years old.  Angline has struggled with GERD; Sleep Apnea and multiple diagnoses of mood disorders over the years.  She reports sleeping only ~4 hours/ night sitting up with Oxygen.  Her appetite varies.  She reports having Bipolar in the past and is not on any current medications for this - as she "just moved back to [place] from [place]" and hasn't put providers in place yet.  Counselor reviewed the PHQ-9 with Mannat - scoring "22" which indicates severe depression.  She admits her mood is not stable currently and she has multiple stressors with her health and family.  Counselor provided a list of providers locally for Willowdean to contact for treatment for her mental health.  Nikeria has goals to lose some weight and hopefully breathe better while in this program.  Counselor and staff  will follow with Ebelyn throughout the course of this program.      Expected Outcomes  Hilde will benefit from consistent exercise to achieve her stated goals.  The educational and psychoeducational components  will be helpful in understanding her condition and coping more positively.  Aubreyanna will meet with the dietician for addressing her weight loss goals.  She was given a list of providers to contact for mental health evaluations and treatment and agreed to put this in place.      Continue Psychosocial Services   Follow up required by counselor       Psychosocial Re-Evaluation: Psychosocial Re-Evaluation    [place] Name 09/13/17 1112 10/08/17 1113 10/11/17 1031 11/01/17 1041 11/26/17 1134     Psychosocial Re-Evaluation   Current issues with  Current Depression;Current Stress Concerns  -  Current Depression;History of Depression;Current Sleep Concerns;Current Stress Concerns  Current Depression;History of Depression;Current Sleep Concerns;Current Stress Concerns  Current Depression;History of Depression;Current Sleep Concerns;Current Stress Concerns   Comments  Charmelle stated that she has not followed up with any of the mental health care providers given to her by program counselor. She is still sleeping about only 4 hours a night.   Marka returned after being absent most of the month. She was in good spirits and reported she was happy to be back. She said her time off was needed, but that she plans on coming next week as well.   Counselor follow up with Zennie today who has been out for awhile - reporting she had Pneumonia.  She continues to report sleep problems and states her mood is "about the same."  When asked if she had contacted any of the providers on the list this counselor gave to her in February, she stated she had not.  When asked if Najia would be willing to see a Dr. about medications for her mood; as she reported a history of Bi-polar Disorder, Jayleene refused - stating - she "takes anough pills already."  She appears to isolate in class and wears her earbuds to listen to music rather than socializing with others.  Aislin also appears irritated to speak to counselor or other staff in this program -more  than is typical; which makes it difficult to follow with her on goal setting or progress made.  When asked how she is coping with ongoing stressors at this time; [place] responded - "I just keep moving."  counselor attempted to educate Hazeline on need to treat the whole person and how much her mood can impact the rest of her health - she appeared disinterested - and also reports she is not in favor or a "lung transplant" and will just keep doing what she is doing.  Counselor made staff aware of this and Zoanne's refusal for mental health treatment at this time.    Nattie states that everything we went over last time is still the same and there is nothing new to report.  Micaella was open about her struggle with her diagnosis that isn't well known. She is frustrated that most of her doctors admit they don't know much about her rare dx and always just keep referring to Flemming's advice. She feels like some her medicates are countering each other, but no one does anything about it. She was encouraged to write down concerns and keep track of potential side effects to discuss with Flemming in two months   Expected Outcomes  Short: Contact a mental health professional to establish care related to her current  depression and stress. Long: Be able to sleep better each night and manage mental health wellbeing.   short: continue coming to [place]. Long: graduate from [employer].   Lunell refuses mental health recommendations and/or treatment at this time - although she would benefit from it greatly.    Short: attend [facility] regularly to decrease stress. Long: Exercise after [facility] to decrease stress  Short: attend [facility] consistently to help with stress and coping. Long: continue to exercise after graduation and find ways to relax   Interventions  Encouraged to attend Pulmonary Rehabilitation for the exercise  -  -  Encouraged to attend Pulmonary Rehabilitation for the exercise  Encouraged to attend Pulmonary Rehabilitation  for the exercise;Relaxation education   Continue Psychosocial Services   Follow up required by counselor  -  -  Follow up required by staff  -      Psychosocial Discharge (Final Psychosocial Re-Evaluation): Psychosocial Re-Evaluation - 11/26/17 1134      Psychosocial Re-Evaluation   Current issues with  Current Depression;History of Depression;Current Sleep Concerns;Current Stress Concerns    Comments  Brent was open about her struggle with her diagnosis that isn't well known. She is frustrated that most of her doctors admit they don't know much about her rare dx and always just keep referring to Flemming's advice. She feels like some her medicates are countering each other, but no one does anything about it. She was encouraged to write down concerns and keep track of potential side effects to discuss with Flemming in two months    Expected Outcomes  Short: attend [facility] consistently to help with stress and coping. Long: continue to exercise after graduation and find ways to relax    Interventions  Encouraged to attend [facility] for the exercise;Relaxation education       Education: Education Goals: Education classes will be provided on a weekly basis, covering required topics. Participant will state understanding/return demonstration of topics presented.  Learning Barriers/Preferences: Learning Barriers/Preferences - 08/10/17 1254      Learning Barriers/Preferences   Learning Barriers  Reading In past year it takes longer to comprehend when reading.    Learning Preferences  Individual Instruction       Education Topics:  Initial Evaluation Education: - Verbal, written and demonstration of respiratory meds, oximetry and breathing techniques. Instruction on use of nebulizers and MDIs and importance of monitoring MDI activations.   Pulmonary Rehab from 12/01/2017 in [facility] Cardiac and Pulmonary Rehab  Date  08/10/17  Educator  SB  Instruction Review Code  1- Verbalizes  Understanding      General Nutrition Guidelines/Fats and Fiber: -Group instruction provided by verbal, written material, models and posters to present the general guidelines for heart healthy nutrition. Gives an explanation and review of dietary fats and fiber.   Controlling Sodium/Reading Food Labels: -Group verbal and written material supporting the discussion of sodium use in heart healthy nutrition. Review and explanation with models, verbal and written materials for utilization of the food label.   Pulmonary Rehab from 12/01/2017 in [facility] Cardiac and Pulmonary Rehab  Date  10/11/17  Educator  CR  Instruction Review Code  1- Verbalizes Understanding      Exercise Physiology & General Exercise Guidelines: - Group verbal and written instruction with models to review the exercise physiology of the cardiovascular system and associated critical values. Provides general exercise guidelines with specific guidelines to those with heart or lung disease.    Aerobic Exercise & Resistance Training: - Gives group verbal and  written instruction on the various components of exercise. Focuses on aerobic and resistive training programs and the benefits of this training and how to safely progress through these programs.   Pulmonary Rehab from 12/01/2017 in [facility] Cardiac and Pulmonary Rehab  Date  11/10/17  Educator  [facility]  Instruction Review Code  1- Geologist, engineering, Balance, Mind/Body Relaxation: Provides group verbal/written instruction on the benefits of flexibility and balance training, including mind/body exercise modes such as yoga, pilates and tai chi.  Demonstration and skill practice provided.   Pulmonary Rehab from 12/01/2017 in [facility] Cardiac and Pulmonary Rehab  Date  08/25/17  Educator  AS  Instruction Review Code  1- Verbalizes Understanding      Stress and Anxiety: - Provides group verbal and written instruction about the health risks of elevated stress and  causes of high stress.  Discuss the correlation between heart/lung disease and anxiety and treatment options. Review healthy ways to manage with stress and anxiety.   Depression: - Provides group verbal and written instruction on the correlation between heart/lung disease and depressed mood, treatment options, and the stigmas associated with seeking treatment.   Pulmonary Rehab from 12/01/2017 in [facility] Cardiac and Pulmonary Rehab  Date  09/01/17  Educator  [facility]  Instruction Review Code  1- Verbalizes Understanding      Exercise & Equipment Safety: - Individual verbal instruction and demonstration of equipment use and safety with use of the equipment.   Pulmonary Rehab from 12/01/2017 in [facility] Cardiac and Pulmonary Rehab  Date  08/10/17  Educator  Sb  Instruction Review Code  1- Verbalizes Understanding      Infection Prevention: - Provides verbal and written material to individual with discussion of infection control including proper hand washing and proper equipment cleaning during exercise session.   Pulmonary Rehab from 12/01/2017 in [facility] Cardiac and Pulmonary Rehab  Date  08/10/17  Educator  SB  Instruction Review Code  1- Verbalizes Understanding      Falls Prevention: - Provides verbal and written material to individual with discussion of falls prevention and safety.   Diabetes: - Individual verbal and written instruction to review signs/symptoms of diabetes, desired ranges of glucose level fasting, after meals and with exercise. Advice that pre and post exercise glucose checks will be done for 3 sessions at entry of program.   Chronic Lung Diseases: - Group verbal and written instruction to review updates, respiratory medications, advancements in procedures and treatments. Discuss use of supplemental oxygen including available portable oxygen systems, continuous and intermittent flow rates, concentrators, personal use and safety guidelines. Review proper use of inhaler and  spacers. Provide informative websites for self-education.    Energy Conservation: - Provide group verbal and written instruction for methods to conserve energy, plan and organize activities. Instruct on pacing techniques, use of adaptive equipment and posture/positioning to relieve shortness of breath.   Pulmonary Rehab from 12/01/2017 in [facility] Cardiac and Pulmonary Rehab  Date  12/01/17  Educator  [facility]  Instruction Review Code  1- Verbalizes Understanding      Triggers and Exacerbations: - Group verbal and written instruction to review types of environmental triggers and ways to prevent exacerbations. Discuss weather changes, air quality and the benefits of nasal washing. Review warning signs and symptoms to help prevent infections. Discuss techniques for effective airway clearance, coughing, and vibrations.   AED/CPR: - Group verbal and written instruction with the use of models to demonstrate the basic use of the AED  with the basic ABC's of resuscitation.   Pulmonary Rehab from 12/01/2017 in [facility] Cardiac and Pulmonary Rehab  Date  09/10/17  Educator  [facility]  Instruction Review Code  5- Refused Teaching      Anatomy and Physiology of the Lungs: - Group verbal and written instruction with the use of models to provide basic lung anatomy and physiology related to function, structure and complications of lung disease.   Anatomy & Physiology of the Heart: - Group verbal and written instruction and models provide basic cardiac anatomy and physiology, with the coronary electrical and arterial systems. Review of Valvular disease and Heart Failure   Cardiac Medications: - Group verbal and written instruction to review commonly prescribed medications for heart disease. Reviews the medication, class of the drug, and side effects.   Pulmonary Rehab from 12/01/2017 in [facility] Cardiac and Pulmonary Rehab  Date  11/19/17  Educator  [facility]  Instruction Review Code  5- Refused Teaching [Had to leave early]       Know Your Numbers and Risk Factors: -Group verbal and written instruction about important numbers in your health.  Discussion of what are risk factors and how they play a role in the disease process.  Review of Cholesterol, Blood Pressure, Diabetes, and BMI and the role they play in your overall health.   Sleep Hygiene: -Provides group verbal and written instruction about how sleep can affect your health.  Define sleep hygiene, discuss sleep cycles and impact of sleep habits. Review good sleep hygiene tips.    Other: -Provides group and verbal instruction on various topics (see comments)    Knowledge Questionnaire Score: Knowledge Questionnaire Score - 08/10/17 1255      Knowledge Questionnaire Score   Pre Score  15/18 Reviewed correct responses with Maysoon and she verbalized understanding of the responses.  She had no further questions today.        Core Components/Risk Factors/Patient Goals at Admission: Personal Goals and Risk Factors at Admission - 08/10/17 1248      Core Components/Risk Factors/Patient Goals on Admission    Weight Management  Yes;Obesity    Intervention  Weight Management: Develop a combined nutrition and exercise program designed to reach desired caloric intake, while maintaining appropriate intake of nutrient and fiber, sodium and fats, and appropriate energy expenditure required for the weight goal.;Weight Management/Obesity: Establish reasonable short term and long term weight goals.;Obesity: Provide education and appropriate resources to help participant work on and attain dietary goals.    Admit Weight  268 lb 12.8 oz (121.9 kg)    Goal Weight: Short Term  265 lb (120.2 kg)    Goal Weight: Long Term  180 lb (81.6 kg)    Expected Outcomes  Short Term: Continue to assess and modify interventions until short term weight is achieved;Long Term: Adherence to nutrition and physical activity/exercise program aimed toward attainment of established weight  goal;Weight Loss: Understanding of general recommendations for a balanced deficit meal plan, which promotes 1-2 lb weight loss per week and includes a negative energy balance of [phone] kcal/d    Improve shortness of breath with ADL's  Yes    Intervention  Provide education, individualized exercise plan and daily activity instruction to help decrease symptoms of SOB with activities of daily living.    Expected Outcomes  Short Term: Improve cardiorespiratory fitness to achieve a reduction of symptoms when performing ADLs;Long Term: Be able to perform more ADLs without symptoms or delay the onset of symptoms    Hypertension  Yes    Intervention  Provide education on lifestyle modifcations including regular physical activity/exercise, weight management, moderate sodium restriction and increased consumption of fresh fruit, vegetables, and low fat dairy, alcohol moderation, and smoking cessation.;Monitor prescription use compliance.    Expected Outcomes  Short Term: Continued assessment and intervention until BP is < 140/45m HG in hypertensive participants. < 130/841mHG in hypertensive participants with diabetes, heart failure or chronic kidney disease.;Long Term: Maintenance of blood pressure at goal levels.    Stress  Yes    Intervention  Offer individual and/or small group education and counseling on adjustment to heart disease, stress management and health-related lifestyle change. Teach and support self-help strategies.;Refer participants experiencing significant psychosocial distress to appropriate mental health specialists for further evaluation and treatment. When possible, include family members and significant others in education/counseling sessions.    Expected Outcomes  Short Term: Participant demonstrates changes in health-related behavior, relaxation and other stress management skills, ability to obtain effective social support, and compliance with psychotropic medications if prescribed.;Long  Term: Emotional wellbeing is indicated by absence of clinically significant psychosocial distress or social isolation.       Core Components/Risk Factors/Patient Goals Review:  Goals and Risk Factor Review    Row Name 09/13/17 1045 10/11/17 1047 11/01/17 1049 11/26/17 1125       Core Components/Risk Factors/Patient Goals Review   Personal Goals Review  Weight Management/Obesity;Hypertension;Stress;Improve shortness of breath with ADL's  Weight Management/Obesity;Hypertension;Improve shortness of breath with ADL's;Stress  Weight Management/Obesity;Hypertension;Improve shortness of breath with ADL's  Weight Management/Obesity;Hypertension;Improve shortness of breath with ADL's    Review  RoZakyatated that she has noticed some improvements in her SOB during daily activities. Her blood pressure as also reported to be under control with diet and exercise/no meds. She stated stress levels are about the same and her weight has also been mantained.   RoGreteeports taking all meds and BP is reported to be under control. Her weight has been maintained but she still has a goal to lose weight. She has been out due to sickness but has retured and started to ease back into her exercise routine.   RoFelicityas gained some weight since she has been out of [facility]. She has not changed her eating habits and eats when she feels hungry. Her blood pressure has been within normal Limits. Her shortness of breath with her ADLS have not changed since her attendance has been sporadic.  RoSanaiis still not happy about her weight, but is trying to change her eating habits. She also has been on steroids on and off for years which doesn't help. Her blood pressure has been within normal limits in [facility]. She has noticed a difference in her SOB, as she has been trying to come more regularly to [facility] and being more attentive to her breathing at home.     Expected Outcomes  Short: Continue attending pulmonary rehab regularly and  continue to take all meds, short term goal weight 260lbs  Long: Independently continue exercise routine. Long term goal weight 220 lbs.    Short: Attending pulmonary rehab regularly after being out due to sickness and ease back into previous workloads, and continue to take all meds, short term goal weight 260lbs  Long: Independently continue exercise routine. Long term goal weight 220 lbs.    Short: attend [facility] regularly. Long: maintain attending [facility] to improve overall health.  Short: attend [facility] consistently. Long: graduate [facility]       Core Components/Risk Factors/Patient Goals  at Discharge (Final Review):  Goals and Risk Factor Review - 11/26/17 1125      Core Components/Risk Factors/Patient Goals Review   Personal Goals Review  Weight Management/Obesity;Hypertension;Improve shortness of breath with ADL's    Review  Euva is still not happy about her weight, but is trying to change her eating habits. She also has been on steroids on and off for years which doesn't help. Her blood pressure has been within normal limits in [facility]. She has noticed a difference in her SOB, as she has been trying to come more regularly to [facility] and being more attentive to her breathing at home.     Expected Outcomes  Short: attend [facility] consistently. Long: graduate [facility]       ITP Comments: ITP Comments    Row Name 08/10/17 1235 08/10/17 1301 08/23/17 0921 09/20/17 0828 09/27/17 1635   ITP Comments  Medical review completed today   ITP sent to Dr Loleta Chance for review, changes as needed and signature.  Documentation of diagnosis can be found in [facility] encounter1/14/2019  Oluwatoni is working on weight loss to be eligible to talk to the lung transplant team.   30 day review completed. ITP sent to Dr. Emily Filbert Director of [place]. Continue with ITP unless changes are made by physician.  30 day review completed. ITP sent to Dr. Emily Filbert Director of [place]. Continue with ITP unless  changes are made by physician.  Left message for Suanne Marker to call back. She has not attended class since 09/17/17.   Row Name 10/06/17 1458 10/18/17 0837 11/15/17 0830 12/13/17 0827     ITP Comments  Tarin has no attended since 09/17/17.  I left a VM about returning to class.   30 day review completed. ITP sent to Dr. Emily Filbert Director of [place]. Continue with ITP unless changes are made by physician   30 day review completed. ITP sent to Dr. Emily Filbert Director of [place]. Continue with ITP unless changes are made by physician   30 day review completed. ITP sent to Dr. Emily Filbert Director of [place]. Continue with ITP unless changes are made by physician       Comments: 30 day review

## 2017-12-17 ENCOUNTER — Encounter: Payer: Medicaid Other | Attending: Specialist

## 2017-12-17 DIAGNOSIS — J45909 Unspecified asthma, uncomplicated: Secondary | ICD-10-CM | POA: Diagnosis not present

## 2017-12-17 NOTE — Progress Notes (Signed)
Daily Session Note  Patient Details  Name: Lisa [name] MRN: [id number] Date of Birth: 1970-08-14 Referring Provider:     Pulmonary Rehab from 08/10/2017 in [facility] Cardiac and Pulmonary Rehab  Referring Provider  Raul Del      Encounter Date: 12/17/2017  Check In: Session Check In - 12/17/17 1030      Check-In   Location  [facility]-Cardiac & Pulmonary Rehab    Staff Present  Joellyn Rued, BS, PEC;Joseph Hood RCP,RRT,BSRT;Laureen Powell, [place], RRT, Respiratory Therapist    Supervising physician immediately available to respond to emergencies  [facility] immediately available ER MD    Physician(s)  Dr.Malinda and Schaevitz    Medication changes reported      No    Fall or balance concerns reported     No    Tobacco Cessation  No Change    Warm-up and Cool-down  Performed on first and last piece of equipment    Resistance Training Performed  Yes    VAD Patient?  No      Pain Assessment   Currently in Pain?  No/denies          Social History   Tobacco Use  Smoking Status Never Smoker  Smokeless Tobacco Never Used    Goals Met:  Independence with exercise equipment Exercise tolerated well No report of cardiac concerns or symptoms Strength training completed today  Goals Unmet:  Not Applicable  Comments: Pt able to follow exercise prescription today without complaint.  Will continue to monitor for progression.   Dr. Emily Filbert is Medical Director for [facility] and [facility].

## 2017-12-24 DIAGNOSIS — J45909 Unspecified asthma, uncomplicated: Secondary | ICD-10-CM

## 2017-12-24 NOTE — Progress Notes (Signed)
Daily Session Note  Patient Details  Name: Lisa [name] MRN: [id number] Date of Birth: 09/28/1970 Referring Provider:     Pulmonary Rehab from 08/10/2017 in [facility] Cardiac and Pulmonary Rehab  Referring Provider  [place]      Encounter Date: 12/24/2017  Check In: Session Check In - 12/24/17 1002      Check-In   Location  [facility]-Cardiac & Pulmonary Rehab    Staff Present    RCP,RRT,BSRT;Amanda Sommer, BA, ACSM CEP, Exercise Physiologist;Meredith Craven, RN BSN    Supervising physician immediately available to respond to emergencies  [facility] immediately available ER MD    Physician(s)  Dr. Kinner and Stafford    Medication changes reported      No    Fall or balance concerns reported     No    Tobacco Cessation  No Change    Warm-up and Cool-down  Performed as group-led instruction    Resistance Training Performed  Yes    VAD Patient?  No      Pain Assessment   Currently in Pain?  No/denies          Social History   Tobacco Use  Smoking Status Never Smoker  Smokeless Tobacco Never Used    Goals Met:  Independence with exercise equipment Exercise tolerated well No report of cardiac concerns or symptoms Strength training completed today  Goals Unmet:  Not Applicable  Comments: Pt able to follow exercise prescription today without complaint.  Will continue to monitor for progression.   Dr. Mark Miller is Medical Director for [facility] and [facility]. 

## 2018-01-10 ENCOUNTER — Encounter: Payer: Medicaid Other | Attending: Specialist

## 2018-01-10 DIAGNOSIS — J45909 Unspecified asthma, uncomplicated: Secondary | ICD-10-CM

## 2018-01-10 NOTE — Progress Notes (Signed)
Pulmonary Individual Treatment Plan  Patient Details  Name: Lisa [name] MRN: [id number] Date of Birth: 1971/04/25 Referring Provider:     Pulmonary Rehab from 08/10/2017 in [facility]  Referring Provider  Raul Del      Initial Encounter Date:    Pulmonary Rehab from 08/10/2017 in [facility] Cardiac and Pulmonary Rehab  Date  08/10/17      Visit Diagnosis: Uncomplicated asthma, unspecified asthma severity, unspecified whether persistent  Patient's Home Medications on Admission:  Current Outpatient Medications:  .  albuterol (PROVENTIL HFA;VENTOLIN HFA) 108 (90 Base) MCG/ACT inhaler, Inhale 2 puffs into the lungs every 6 (six) hours as needed for wheezing or shortness of breath., Disp: 1 Inhaler, Rfl: 2 .  azaTHIOprine (IMURAN) 50 MG tablet, Take by mouth., Disp: , Rfl:  .  ferrous sulfate 325 (65 FE) MG tablet, Take 325 mg by mouth daily with breakfast., Disp: , Rfl:  .  gabapentin (NEURONTIN) 100 MG capsule, Take by mouth., Disp: , Rfl:  .  Ipratropium-Albuterol (COMBIVENT) 20-100 MCG/ACT AERS respimat, Inhale 1 puff into the lungs every 6 (six) hours., Disp: , Rfl:  .  montelukast (SINGULAIR) 10 MG tablet, Take by mouth., Disp: , Rfl:  .  omeprazole (PRILOSEC) 40 MG capsule, Take by mouth., Disp: , Rfl:  .  phentermine 37.5 MG capsule, Take by mouth., Disp: , Rfl:  .  predniSONE (DELTASONE) 10 MG tablet, Take 20 mg by mouth daily., Disp: , Rfl:   Past Medical History: Past Medical History:  Diagnosis Date  . Abdominal pain   . Anemia    hx of anemic  . Asthma   . Bipolar 1 disorder ([place])   . Blood in stool   . Change in voice   . Chest pain   . Constipation   . Depression   . Diverticulitis   . DVT (deep venous thrombosis) ([place]) 12/13/2014  . Dysrhythmia   . Fatigue   . Gallstones 04/10/2011   with biliary pancreatitis  . GERD (gastroesophageal reflux disease)   . Migraines   . MRSA carrier   . Nausea   . Pancreatitis   . PTSD (post-traumatic  stress disorder)   . Pulmonary embolism ([place]) 12/13/2014  . Sleep apnea    cpap  . Trouble swallowing     Tobacco Use: Social History   Tobacco Use  Smoking Status Never Smoker  Smokeless Tobacco Never Used    Labs: Recent Review Scientist, physiological    Labs for ITP Cardiac and Pulmonary Rehab Latest Ref Rng & Units 03/18/2014   Hemoglobin A1c 4.2 - 6.3 % 5.6       Pulmonary Assessment Scores: Pulmonary Assessment Scores    Row Name 08/10/17 1257         ADL UCSD   ADL Phase  Entry     SOB Score total  98     Rest  2     Walk  4     Stairs  5     Bath  4     Dress  3     Shop  4       CAT Score   CAT Score  39       mMRC Score   mMRC Score  2        Pulmonary Function Assessment: Pulmonary Function Assessment - 08/10/17 1256      Breath   Shortness of Breath  Fear of Shortness of Breath;Yes;Limiting activity  Exercise Target Goals:    Exercise Program Goal: Individual exercise prescription set using results from initial 6 min walk test and THRR while considering  patient's activity barriers and safety.    Exercise Prescription Goal: Initial exercise prescription builds to 30-45 minutes a day of aerobic activity, 2-3 days per week.  Home exercise guidelines will be given to patient during program as part of exercise prescription that the participant will acknowledge.  Activity Barriers & Risk Stratification: Activity Barriers & Cardiac Risk Stratification - 08/10/17 1247      Activity Barriers & Cardiac Risk Stratification   Activity Barriers  Back Problems;Shortness of Breath       6 Minute Walk: 6 Minute Walk    Row Name 08/10/17 1318         6 Minute Walk   Phase  Initial     Distance  1250 feet     Walk Time  6 minutes     # of Rest Breaks  0     MPH  2.37     METS  4.13     RPE  19     Perceived Dyspnea   4     VO2 Peak  14.47     Symptoms  Yes (comment)     Comments  back pain 7/10     Resting HR  77 bpm     Resting BP   126/76     Resting Oxygen Saturation   98 %     Exercise Oxygen Saturation  during 6 min walk  91 %     Max Ex. HR  138 bpm     Max Ex. BP  162/86     2 Minute Post BP  138/84       Interval HR   1 Minute HR  112     2 Minute HR  133     3 Minute HR  136     4 Minute HR  134     5 Minute HR  138     6 Minute HR  115     2 Minute Post HR  91     Interval Heart Rate?  Yes       Interval Oxygen   Interval Oxygen?  Yes     Baseline Oxygen Saturation %  98 %     1 Minute Liters of Oxygen  0 L     2 Minute Oxygen Saturation %  91 %     2 Minute Liters of Oxygen  0 L     3 Minute Oxygen Saturation %  94 %     3 Minute Liters of Oxygen  0 L     4 Minute Oxygen Saturation %  95 %     4 Minute Liters of Oxygen  0 L     5 Minute Oxygen Saturation %  94 %     5 Minute Liters of Oxygen  0 L     6 Minute Oxygen Saturation %  95 %     6 Minute Liters of Oxygen  0 L     2 Minute Post Oxygen Saturation %  100 %     2 Minute Post Liters of Oxygen  0 L       Oxygen Initial Assessment: Oxygen Initial Assessment - 08/10/17 1238      Home Oxygen   Home Oxygen Device  Home Concentrator    Sleep Oxygen Prescription  Continuous;CPAP  Liters per minute  2    Home Exercise Oxygen Prescription  None    Home at Rest Exercise Oxygen Prescription  None    Compliance with Home Oxygen Use  Yes      Initial 6 min Walk   Oxygen Used  None      Program Oxygen Prescription   Program Oxygen Prescription  None      Intervention   Short Term Goals  To learn and exhibit compliance with exercise, home and travel O2 prescription;To learn and understand importance of maintaining oxygen saturations>88%;To learn and demonstrate proper use of respiratory medications;To learn and understand importance of monitoring SPO2 with pulse oximeter and demonstrate accurate use of the pulse oximeter.;To learn and demonstrate proper pursed lip breathing techniques or other breathing techniques.    Long  Term Goals   Exhibits compliance with exercise, home and travel O2 prescription;Verbalizes importance of monitoring SPO2 with pulse oximeter and return demonstration;Maintenance of O2 saturations>88%;Exhibits proper breathing techniques, such as pursed lip breathing or other method taught during program session;Compliance with respiratory medication;Demonstrates proper use of MDI's       Oxygen Re-Evaluation: Oxygen Re-Evaluation    Row Name 08/16/17 1029 09/13/17 1105 10/11/17 1051 11/01/17 1035 11/26/17 1138     Program Oxygen Prescription   Program Oxygen Prescription  -  None  None  None  None     Home Oxygen   Home Oxygen Device  -  Home Concentrator  Home Concentrator  Home Concentrator  Home Concentrator   Sleep Oxygen Prescription  -  Continuous  Continuous  Continuous  Continuous   Liters per minute  -  '2  2  2  2   ' Home Exercise Oxygen Prescription  -  None  None  None  None   Home at Rest Exercise Oxygen Prescription  -  None  None  None  None   Compliance with Home Oxygen Use  -  Yes  Yes  Yes  Yes     Goals/Expected Outcomes   Short Term Goals  To learn and exhibit compliance with exercise, home and travel O2 prescription;To learn and understand importance of maintaining oxygen saturations>88%;To learn and demonstrate proper use of respiratory medications;To learn and understand importance of monitoring SPO2 with pulse oximeter and demonstrate accurate use of the pulse oximeter.;To learn and demonstrate proper pursed lip breathing techniques or other breathing techniques.  To learn and exhibit compliance with exercise, home and travel O2 prescription;To learn and understand importance of maintaining oxygen saturations>88%;To learn and demonstrate proper use of respiratory medications;To learn and understand importance of monitoring SPO2 with pulse oximeter and demonstrate accurate use of the pulse oximeter.;To learn and demonstrate proper pursed lip breathing techniques or other breathing  techniques.  To learn and exhibit compliance with exercise, home and travel O2 prescription;To learn and understand importance of maintaining oxygen saturations>88%;To learn and demonstrate proper use of respiratory medications;To learn and understand importance of monitoring SPO2 with pulse oximeter and demonstrate accurate use of the pulse oximeter.;To learn and demonstrate proper pursed lip breathing techniques or other breathing techniques.  To learn and exhibit compliance with exercise, home and travel O2 prescription;To learn and understand importance of maintaining oxygen saturations>88%;To learn and demonstrate proper use of respiratory medications;To learn and understand importance of monitoring SPO2 with pulse oximeter and demonstrate accurate use of the pulse oximeter.;To learn and demonstrate proper pursed lip breathing techniques or other breathing techniques.  To learn and exhibit compliance with exercise, home and travel  O2 prescription;To learn and understand importance of maintaining oxygen saturations>88%;To learn and demonstrate proper use of respiratory medications;To learn and understand importance of monitoring SPO2 with pulse oximeter and demonstrate accurate use of the pulse oximeter.;To learn and demonstrate proper pursed lip breathing techniques or other breathing techniques.   Long  Term Goals  Exhibits compliance with exercise, home and travel O2 prescription;Verbalizes importance of monitoring SPO2 with pulse oximeter and return demonstration;Maintenance of O2 saturations>88%;Exhibits proper breathing techniques, such as pursed lip breathing or other method taught during program session;Compliance with respiratory medication;Demonstrates proper use of MDI's  Exhibits compliance with exercise, home and travel O2 prescription;Verbalizes importance of monitoring SPO2 with pulse oximeter and return demonstration;Maintenance of O2 saturations>88%;Exhibits proper breathing techniques,  such as pursed lip breathing or other method taught during program session;Compliance with respiratory medication;Demonstrates proper use of MDI's  Exhibits compliance with exercise, home and travel O2 prescription;Verbalizes importance of monitoring SPO2 with pulse oximeter and return demonstration;Maintenance of O2 saturations>88%;Exhibits proper breathing techniques, such as pursed lip breathing or other method taught during program session;Compliance with respiratory medication;Demonstrates proper use of MDI's  Exhibits compliance with exercise, home and travel O2 prescription;Verbalizes importance of monitoring SPO2 with pulse oximeter and return demonstration;Maintenance of O2 saturations>88%;Exhibits proper breathing techniques, such as pursed lip breathing or other method taught during program session;Compliance with respiratory medication;Demonstrates proper use of MDI's  Exhibits compliance with exercise, home and travel O2 prescription;Verbalizes importance of monitoring SPO2 with pulse oximeter and return demonstration;Maintenance of O2 saturations>88%;Exhibits proper breathing techniques, such as pursed lip breathing or other method taught during program session;Compliance with respiratory medication;Demonstrates proper use of MDI's   Comments  Reviewed PLB technique with pt.  Talked about how it work and it's important to maintaining his exercise saturations.  Shonnie stated that she does use PLB at home to help control SOB, and has noticed some improvements in SOB. She does not currently have a pulse oximeter or monitor her oxygen saturations. She was advised to purchase a pulse ox and use it at home.   Wilhelmenia had been out sick and her SOB was worse duing her sickness, but has now improved since getting better and back to class. She did not yet buy a pulse oximeter to monitor her oxygen at home but was encouraged to do so again.   Rilyn is taking predinisone, combivent and albuterol as prescribed.  She has no questions about her medicatons. She uses her PLB when she gets short of breath in class and at home. She has not obtained a pulse oximeter yet.  Her oxygen has been above 88 percent on all her exercises and at rest.   Lya is taking her medications appropriately. She is still on prednisone. Staff encouraged her to be mindful about potential side effects that affect her appetite and mood. Her oxygen is stable while in class.    Goals/Expected Outcomes  Short: Become more profiecient at using PLB.   Long: Become independent at using PLB.  Short: Purchase a pulse oxymeter and start monitoring oxygen saturations at home. Long: Become independent monitoring oxygen saturations and using PLB at home.   Short: Purchase a pulse oxymeter and start monitoring oxygen saturations at home. Long: Become independent monitoring oxygen saturations and using PLB at home.   Short: continue to exercise to reduce oxygen need. Long: maintain exercise to keep oxygen needs above 88 percent.  Short: continue to exercise to help with SOB and oxygen need, keep track of side effects of respiratory medications  to monitor a trend in adverse reactions. Long: become indpendent with managing oxygen needs and continue to manage her medications independently       Oxygen Discharge (Final Oxygen Re-Evaluation): Oxygen Re-Evaluation - 11/26/17 1138      Program Oxygen Prescription   Program Oxygen Prescription  None      Home Oxygen   Home Oxygen Device  Home Concentrator    Sleep Oxygen Prescription  Continuous    Liters per minute  2    Home Exercise Oxygen Prescription  None    Home at Rest Exercise Oxygen Prescription  None    Compliance with Home Oxygen Use  Yes      Goals/Expected Outcomes   Short Term Goals  To learn and exhibit compliance with exercise, home and travel O2 prescription;To learn and understand importance of maintaining oxygen saturations>88%;To learn and demonstrate proper use of respiratory  medications;To learn and understand importance of monitoring SPO2 with pulse oximeter and demonstrate accurate use of the pulse oximeter.;To learn and demonstrate proper pursed lip breathing techniques or other breathing techniques.    Long  Term Goals  Exhibits compliance with exercise, home and travel O2 prescription;Verbalizes importance of monitoring SPO2 with pulse oximeter and return demonstration;Maintenance of O2 saturations>88%;Exhibits proper breathing techniques, such as pursed lip breathing or other method taught during program session;Compliance with respiratory medication;Demonstrates proper use of MDI's    Comments  Maylani is taking her medications appropriately. She is still on prednisone. Staff encouraged her to be mindful about potential side effects that affect her appetite and mood. Her oxygen is stable while in class.     Goals/Expected Outcomes  Short: continue to exercise to help with SOB and oxygen need, keep track of side effects of respiratory medications to monitor a trend in adverse reactions. Long: become indpendent with managing oxygen needs and continue to manage her medications independently        Initial Exercise Prescription: Initial Exercise Prescription - 08/10/17 1300      Date of Initial Exercise RX and Referring Provider   Date  08/10/17    Referring Provider  Raul Del      Treadmill   MPH  2.3    Grade  0.5    Minutes  15    METs  2.92      NuStep   Level  2    SPM  80    Minutes  15    METs  3      REL-XR   Level  2    Speed  50    Minutes  15    METs  3      Biostep-RELP   Level  2    SPM  50    Minutes  15    METs  3      Prescription Details   Frequency (times per week)  3    Duration  Progress to 45 minutes of aerobic exercise without signs/symptoms of physical distress      Intensity   THRR 40-80% of Max Heartrate  116-156    Ratings of Perceived Exertion  11-13    Perceived Dyspnea  0-4      Resistance Training   Training  Prescription  Yes    Weight  3 lb    Reps  10-15       Perform Capillary Blood Glucose checks as needed.  Exercise Prescription Changes: Exercise Prescription Changes    Row Name 08/25/17 1100 08/25/17 1300 09/10/17  0800 10/20/17 1400 11/03/17 1200     Response to Exercise   Blood Pressure (Admit)  -  146/84  106/64  132/80  122/80   Blood Pressure (Exercise)  -  128/64  -  -  -   Blood Pressure (Exit)  -  126/84  132/82  126/78  132/78   Heart Rate (Admit)  -  82 bpm  102 bpm  115 bpm  115 bpm   Heart Rate (Exercise)  -  109 bpm  132 bpm  138 bpm  128 bpm   Heart Rate (Exit)  -  82 bpm  98 bpm  120 bpm  96 bpm   Oxygen Saturation (Admit)  -  99 %  98 %  97 %  95 %   Oxygen Saturation (Exercise)  -  94 %  94 %  91 %  92 %   Oxygen Saturation (Exit)  -  97 %  98 %  98 %  98 %   Rating of Perceived Exertion (Exercise)  -  '13  14  14  14   ' Perceived Dyspnea (Exercise)  -  '3  3  3  3   ' Symptoms  -  none  none  non2  none   Duration  -  Continue with 45 min of aerobic exercise without signs/symptoms of physical distress.  Continue with 45 min of aerobic exercise without signs/symptoms of physical distress.  Continue with 45 min of aerobic exercise without signs/symptoms of physical distress.  Continue with 45 min of aerobic exercise without signs/symptoms of physical distress.   Intensity  -  THRR unchanged  THRR unchanged  THRR unchanged  THRR unchanged     Progression   Progression  -  Continue to progress workloads to maintain intensity without signs/symptoms of physical distress.  Continue to progress workloads to maintain intensity without signs/symptoms of physical distress.  Continue to progress workloads to maintain intensity without signs/symptoms of physical distress.  Continue to progress workloads to maintain intensity without signs/symptoms of physical distress.   Average METs  -  2.7  3.3  2.94  2.8     Resistance Training   Training Prescription  -  Yes  Yes  Yes  Yes    Weight  -  3 lb  3 lb  3 lb  3 lb   Reps  -  10-15  10-15  10-15  10-15     Interval Training   Interval Training  -  No  No  -  No     Treadmill   MPH  -  2.3  2.3  2.3  2.3   Grade  -  0.5  0.5  0.5  0.5   Minutes  -  '15  15  15  15   ' METs  -  2.92  2.92  2.92  2.92     NuStep   Level  -  2  -  3  3   SPM  -  80  -  80  80   Minutes  -  15  -  15  15   METs  -  2.5  -  2.8  3     REL-XR   Level  -  -  '5  5  2   ' Speed  -  -  71  -  -   Minutes  -  -  '15  15  15   ' METs  -  -  3.7  3.1  2.4     Home Exercise Plan   Plans to continue exercise at  [employer] (comment) [place] (comment) [employer] (comment) [employer] (comment[employer]   Frequency  Add 1 additional day to program exercise sessions.  -  Add 1 additional day to program exercise sessions.  Add 1 additional day to program exercise sessions.  Add 1 additional day to program exercise sessions.   Initial Home Exercises Provided  08/25/17  -  08/25/17  08/25/17  08/25/17   Row Name 11/17/17 1500 12/01/17 1200 12/29/17 1500         Response to Exercise   Blood Pressure (Admit)  114/74  134/60  142/84     Blood Pressure (Exit)  130/74  138/84  138/80     Heart Rate (Admit)  104 bpm  125 bpm  83 bpm     Heart Rate (Exercise)  114 bpm  137 bpm  107 bpm     Heart Rate (Exit)  72 bpm  84 bpm  70 bpm     Oxygen Saturation (Admit)  96 %  95 %  99 %     Oxygen Saturation (Exercise)  95 %  94 %  97 %     Oxygen Saturation (Exit)  98 %  98 %  99 %     Rating of Perceived Exertion (Exercise)  '14  13  13     ' Perceived Dyspnea (Exercise)  '3  2  2     ' Symptoms  none  none  none     Duration  Continue with 45 min of aerobic exercise without signs/symptoms of physical distress.  Continue with 45 min of aerobic exercise without signs/symptoms of physical distress.  Continue with 45 min of aerobic exercise without signs/symptoms of physical distress.     Intensity  THRR unchanged  THRR  unchanged  THRR unchanged       Progression   Progression  Continue to progress workloads to maintain intensity without signs/symptoms of physical distress.  Continue to progress workloads to maintain intensity without signs/symptoms of physical distress.  Continue to progress workloads to maintain intensity without signs/symptoms of physical distress.     Average METs  3.6  3.3  4       Resistance Training   Training Prescription  Yes  Yes  Yes     Weight  3 lb  3 lb  3 lb     Reps  10-15  10-15  10-15       Interval Training   Interval Training  No  No  No       Treadmill   MPH  2.3  -  2.3     Grade  0.5  -  0.5     Minutes  15  -  15     METs  2.92  -  2.92       NuStep   Level  '3  4  4     ' SPM  88  110  108     Minutes  '15  15  15     ' METs  3.1  3.2  2.9       REL-XR   Level  '5  5  5     ' Speed  60  65  65     Minutes  '15  15  15     ' METs  4.7  3.4  4.9       Home Exercise Plan   Plans to continue exercise at  [employer] (comment) [place] (comment) Gym     Frequency  Add 1 additional day to program exercise sessions.  -  Add 1 additional day to program exercise sessions.     Initial Home Exercises Provided  08/25/17  -  -        Exercise Comments: Exercise Comments    Row Name 08/16/17 1028           Exercise Comments  First full day of exercise!  Patient was oriented to gym and equipment including functions, settings, policies, and procedures.  Patient's individual exercise prescription and treatment plan were reviewed.  All starting workloads were established based on the results of the 6 minute walk test done at initial orientation visit.  The plan for exercise progression was also introduced and progression will be customized based on patient's performance and goals.          Exercise Goals and Review: Exercise Goals    Row Name 08/10/17 1314             Exercise Goals   Increase Physical Activity  Yes       Intervention   Provide advice, education, support and counseling about physical activity/exercise needs.;Develop an individualized exercise prescription for aerobic and resistive training based on initial evaluation findings, risk stratification, comorbidities and participant's personal goals.       Expected Outcomes  Short Term: Attend rehab on a regular basis to increase amount of physical activity.;Long Term: Exercising regularly at least 3-5 days a week.       Increase Strength and Stamina  Yes       Intervention  Provide advice, education, support and counseling about physical activity/exercise needs.;Develop an individualized exercise prescription for aerobic and resistive training based on initial evaluation findings, risk stratification, comorbidities and participant's personal goals.       Expected Outcomes  Short Term: Increase workloads from initial exercise prescription for resistance, speed, and METs.;Long Term: Improve cardiorespiratory fitness, muscular endurance and strength as measured by increased METs and functional capacity (6MWT);Short Term: Perform resistance training exercises routinely during rehab and add in resistance training at home       Able to understand and use rate of perceived exertion (RPE) scale  Yes       Intervention  Provide education and explanation on how to use RPE scale       Expected Outcomes  Short Term: Able to use RPE daily in rehab to express subjective intensity level;Long Term:  Able to use RPE to guide intensity level when exercising independently       Able to understand and use Dyspnea scale  Yes       Intervention  Provide education and explanation on how to use Dyspnea scale       Expected Outcomes  Short Term: Able to use Dyspnea scale daily in rehab to express subjective sense of shortness of breath during exertion;Long Term: Able to use Dyspnea scale to guide intensity level when exercising independently       Knowledge and understanding of Target Heart Rate  Range (THRR)  Yes       Intervention  Provide education and explanation of THRR including how the numbers were predicted and where they are located for reference       Expected Outcomes  Short Term: Able to state/look  up THRR;Short Term: Able to use daily as guideline for intensity in rehab;Long Term: Able to use THRR to govern intensity when exercising independently       Able to check pulse independently  Yes       Intervention  Provide education and demonstration on how to check pulse in carotid and radial arteries.;Review the importance of being able to check your own pulse for safety during independent exercise       Expected Outcomes  Short Term: Able to explain why pulse checking is important during independent exercise;Long Term: Able to check pulse independently and accurately       Understanding of Exercise Prescription  Yes       Intervention  Provide education, explanation, and written materials on patient's individual exercise prescription       Expected Outcomes  Short Term: Able to explain program exercise prescription;Long Term: Able to explain home exercise prescription to exercise independently          Exercise Goals Re-Evaluation : Exercise Goals Re-Evaluation    Row Name 08/16/17 1028 08/25/17 1138 09/10/17 0814 10/11/17 1103 10/20/17 1456     Exercise Goal Re-Evaluation   Exercise Goals Review  Understanding of Exercise Prescription;Able to understand and use Dyspnea scale;Knowledge and understanding of Target Heart Rate Range (THRR);Able to understand and use rate of perceived exertion (RPE) scale  Increase Physical Activity;Increase Strength and Stamina  Increase Physical Activity;Able to understand and use rate of perceived exertion (RPE) scale;Increase Strength and Stamina;Able to understand and use Dyspnea scale  Increase Physical Activity;Able to understand and use rate of perceived exertion (RPE) scale;Increase Strength and Stamina;Able to understand and use Dyspnea  scale  Increase Physical Activity;Able to understand and use rate of perceived exertion (RPE) scale;Increase Strength and Stamina;Able to understand and use Dyspnea scale   Comments  Reviewed RPE scale, THR and program prescription with pt today.  Pt voiced understanding and was given a copy of goals to take home.   Reviewed home exercise with pt today.  Pt plans to go the gym 1 extra day for exercise.  Reviewed THR, pulse, RPE, sign and symptoms, NTG use, and when to call 911 or MD.  Also discussed weather considerations and indoor options.  Pt voiced understanding.  Pt voiced understanding.  Laniya is tolerating exercise well.  She has increased level on the XR and reaches her THR range.  Staff will continue to monitor.  Has been out sick and just returned to class. Working back up to previous workolads.   Kelsea tolerates exercise well.  She would benefit from regular attendance 3 times per week.   Expected Outcomes  Short: Use RPE daily to regulate intensity.  Long: Follow program prescription in THR.  Short: add 1 extra day of exercise. Long: maintain and add more days to home exercise.  Short - Derhonda will contiinue to attend three times per week Long - Melysa will increase overall MET level  Short: Return to previous workloads prior to being sick and become consistant with attendance since she is no longer sick. Long: Audreyanna will increase overall MET level and be consistant with her exercise to help in weight loss.   Short - Eulla will attend regularly Long - Abeni will maintain exercise on her own   [place] Name 11/03/17 1246 11/17/17 1519 12/01/17 1204 12/29/17 1547       Exercise Goal Re-Evaluation   Exercise Goals Review  Increase Physical Activity;Able to understand and use rate of perceived exertion (  RPE) scale;Increase Strength and Stamina;Able to understand and use Dyspnea scale  Increase Physical Activity;Able to understand and use rate of perceived exertion (RPE) scale;Increase Strength and  Stamina;Able to understand and use Dyspnea scale  Increase Physical Activity;Able to understand and use rate of perceived exertion (RPE) scale;Knowledge and understanding of Target Heart Rate Range (THRR);Increase Strength and Stamina;Able to understand and use Dyspnea scale  Increase Physical Activity;Able to understand and use rate of perceived exertion (RPE) scale;Increase Strength and Stamina;Able to understand and use Dyspnea scale    Comments  Analicia has attended only 3 sessions in April so far.  She will see more progress with regular attendance.  Annice would progress more with regular attendance  Suanne Marker has increased resistance on NS since last review.  Staff will monitor progress.  Pt continues to tolerate exercise well    Expected Outcomes  Short - Acasia will attend class on a consistent basis 3 times per week Long - Ritamarie will imporve overall MET level  Kotlik will attend consistently 2-3 times per week Long - Pt will maintain exercise on her own  Short - Suanne Marker will exercise at home on days she doesnt attend class Long - Suanne Marker will exercise independently  Short - complete LW program Long - maintain exercise on her own       Discharge Exercise Prescription (Final Exercise Prescription Changes): Exercise Prescription Changes - 12/29/17 1500      Response to Exercise   Blood Pressure (Admit)  142/84    Blood Pressure (Exit)  138/80    Heart Rate (Admit)  83 bpm    Heart Rate (Exercise)  107 bpm    Heart Rate (Exit)  70 bpm    Oxygen Saturation (Admit)  99 %    Oxygen Saturation (Exercise)  97 %    Oxygen Saturation (Exit)  99 %    Rating of Perceived Exertion (Exercise)  13    Perceived Dyspnea (Exercise)  2    Symptoms  none    Duration  Continue with 45 min of aerobic exercise without signs/symptoms of physical distress.    Intensity  THRR unchanged      Progression   Progression  Continue to progress workloads to maintain intensity without signs/symptoms of physical  distress.    Average METs  4      Resistance Training   Training Prescription  Yes    Weight  3 lb    Reps  10-15      Interval Training   Interval Training  No      Treadmill   MPH  2.3    Grade  0.5    Minutes  15    METs  2.92      NuStep   Level  4    SPM  108    Minutes  15    METs  2.9      REL-XR   Level  5    Speed  65    Minutes  15    METs  4.9      Home Exercise Plan   Plans to continue exercise at  [employer] (comment) Gym    Frequency  Add 1 additional day to program exercise sessions.       Nutrition:  Target Goals: Understanding of nutrition guidelines, daily intake of sodium <1565m, cholesterol <2045m calories 30% from fat and 7% or less from saturated fats, daily to have 5 or more servings of fruits and vegetables.  Biometrics: Pre Biometrics - 08/10/17 1313      Pre Biometrics   Height  '5\' 8"'  (1.727 m)    Weight  268 lb 12.8 oz (121.9 kg)    Waist Circumference  42 inches    Hip Circumference  44.5 inches    Waist to Hip Ratio  0.94 %    BMI (Calculated)  40.88        Nutrition Therapy Plan and Nutrition Goals: Nutrition Therapy & Goals - 09/13/17 1059      Intervention Plan   Expected Outcomes  Short Term Goal: Understand basic principles of dietary content, such as calories, fat, sodium, cholesterol and nutrients.;Short Term Goal: A plan has been developed with personal nutrition goals set during dietitian appointment.;Long Term Goal: Adherence to prescribed nutrition plan.       Nutrition Assessments: Nutrition Assessments - 08/10/17 1247      MEDFICTS Scores   Pre Score  46       Nutrition Goals Re-Evaluation: Nutrition Goals Re-Evaluation    Row Name 09/13/17 1100 10/11/17 1056 11/01/17 1039 11/26/17 1132       Goals   Current Weight  269 lb (122 kg)  272 lb (123.4 kg)  273 lb 11.2 oz (124.1 kg)  266 lb (120.7 kg)    Nutrition Goal  Look for dietary sources of Vitamin B12 such as egg yolks, mushrooms,  nutritional yeast  -  lose weight, Have more energy.  Lose weight    Comment  Minela met with the dietician and set initial nutrition goals that she stated she is already following much of.   Panhia stated that she is eating when she feels like it and is trying to follow healthy eating guidelines.   She states that her dietician appointment was pointless. She has not had an appitite this morning and did not eat breakfast.  Shacoya was encouraged to come to the General Nutrition class coming up next week in [facility] to see if it is more helpful. She still has no appetite, but is working on trying to snack appropriately     Expected Outcome  Short Term Goal: Understand basic principles of dietary content, such as calories, fat, sodium, cholesterol and nutrients.;Short Term Goal: A plan has been developed with personal nutrition goals set during dietitian appointment.;Long Term Goal: Adherence to prescribed nutrition plan.;  -  Short: make better eating choices to lose weight. Long: lose weight and maintain weight loss.  Short: attend General Nutrition class. long: lose weight to get to goal weight and maintain it.       Personal Goal #2 Re-Evaluation   Personal Goal #2  Work on incorporating a "mini" meal or snack during the first part of the day to help get you on a more regular eating schedule; aim for 200kcal extra per day to start  Short: get into consistance healthy eating pattern.  weight goal of 260 lbs  Long: Goal weight 220 lbs.   -  -       Nutrition Goals Discharge (Final Nutrition Goals Re-Evaluation): Nutrition Goals Re-Evaluation - 11/26/17 1132      Goals   Current Weight  266 lb (120.7 kg)    Nutrition Goal  Lose weight    Comment  Gavina was encouraged to come to the General Nutrition class coming up next week in [facility] to see if it is more helpful. She still has no appetite, but is working on trying to snack appropriately     Expected  Outcome  Short: attend General Nutrition class.  long: lose weight to get to goal weight and maintain it.        Psychosocial: Target Goals: Acknowledge presence or absence of significant depression and/or stress, maximize coping skills, provide positive support system. Participant is able to verbalize types and ability to use techniques and skills needed for reducing stress and depression.   Initial Review & Psychosocial Screening: Initial Psych Review & Screening - 08/10/17 1249      Initial Review   Current issues with  Current Depression;History of Depression;Current Sleep Concerns;Current Stress Concerns    Source of Stress Concerns  Chronic Illness;Family;Transportation;Financial;Unable to perform yard/household activities;Unable to participate in former interests or hobbies    Comments  Currently sleeping only 2-4 hours. Waking up from coughing and not able to immediately go back to sleep. Applied for disability and was denied, now working with a Chief Executive Officer to reapply for disability benefits. Not able to manage any ADL's without needing to stop frequently because so short of breath      Family Dynamics   Good Support System?  Yes      Barriers   Psychosocial barriers to participate in program  There are no identifiable barriers or psychosocial needs.;The patient should benefit from training in stress management and relaxation.      Screening Interventions   Interventions  Encouraged to exercise;Provide feedback about the scores to participant;Program counselor consult;To provide support and resources with identified psychosocial needs    Expected Outcomes  Short Term goal: Utilizing psychosocial counselor, staff and physician to assist with identification of specific Stressors or current issues interfering with healing process. Setting desired goal for each stressor or current issue identified.;Long Term Goal: Stressors or current issues are controlled or eliminated.;Short Term goal: Identification and review with participant of any  Quality of Life or Depression concerns found by scoring the questionnaire.;Long Term goal: The participant improves quality of Life and PHQ9 Scores as seen by post scores and/or verbalization of changes       Quality of Life Scores:  Scores of 19 and below usually indicate a poorer quality of life in these areas.  A difference of  2-3 points is a clinically meaningful difference.  A difference of 2-3 points in the total score of the Quality of Life Index has been associated with significant improvement in overall quality of life, self-image, physical symptoms, and general health in studies assessing change in quality of life.  PHQ-9: Recent Review Flowsheet Data    Depression screen [facility] 2/9 08/10/2017   Decreased Interest 3   Down, Depressed, Hopeless 2   PHQ - 2 Score 5   Altered sleeping 3   Tired, decreased energy 3   Change in appetite 3   Feeling bad or failure about yourself  2   Trouble concentrating 3   Moving slowly or fidgety/restless 3   Suicidal thoughts 0   PHQ-9 Score 22   Difficult doing work/chores Extremely dIfficult     Interpretation of Total Score  Total Score Depression Severity:  1-4 = Minimal depression, 5-9 = Mild depression, 10-14 = Moderate depression, 15-19 = Moderately severe depression, 20-27 = Severe depression   Psychosocial Evaluation and Intervention: Psychosocial Evaluation - 08/18/17 1133      Psychosocial Evaluation & Interventions   Interventions  Encouraged to exercise with the program and follow exercise prescription;Stress management education;Relaxation education;Therapist referral    Comments  Counselor met with Evette today for initial psychsocial evaluation.  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  She has a strong support system with family close by and (3) children in the home - (1) who is 47 years old.  Trey has struggled with GERD; Sleep Apnea and multiple diagnoses  of mood disorders over the years.  She reports sleeping only ~4 hours/ night sitting up with Oxygen.  Her appetite varies.  She reports having Bipolar in the past and is not on any current medications for this - as she "just moved back to [place] from [place]" and hasn't put providers in place yet.  Counselor reviewed the PHQ-9 with Jahlia - scoring "22" which indicates severe depression.  She admits her mood is not stable currently and she has multiple stressors with her health and family.  Counselor provided a list of providers locally for Arlis to contact for treatment for her mental health.  Maalle has goals to lose some weight and hopefully breathe better while in this program.  Counselor and staff will follow with Mariane throughout the course of this program.      Expected Outcomes  Maurisa will benefit from consistent exercise to achieve her stated goals.  The educational and psychoeducational components will be helpful in understanding her condition and coping more positively.  Sae will meet with the dietician for addressing her weight loss goals.  She was given a list of providers to contact for mental health evaluations and treatment and agreed to put this in place.      Continue Psychosocial Services   Follow up required by counselor       Psychosocial Re-Evaluation: Psychosocial Re-Evaluation    [place] Name 09/13/17 1112 10/08/17 1113 10/11/17 1031 11/01/17 1041 11/26/17 1134     Psychosocial Re-Evaluation   Current issues with  Current Depression;Current Stress Concerns  -  Current Depression;History of Depression;Current Sleep Concerns;Current Stress Concerns  Current Depression;History of Depression;Current Sleep Concerns;Current Stress Concerns  Current Depression;History of Depression;Current Sleep Concerns;Current Stress Concerns   Comments  Aldene stated that she has not followed up with any of the mental health care providers given to her by program counselor. She is still sleeping about only 4 hours  a night.   Maitlyn returned after being absent most of the month. She was in good spirits and reported she was happy to be back. She said her time off was needed, but that she plans on coming next week as well.   Counselor follow up with Gen[place] today who has been out for awhile - reporting she had Pneumonia.  She continues to report sleep problems and states her mood is "about the same."  When asked if she had contacted any of the providers on the list this counselor gave to her in February, she stated she had not.  When asked if Jaydon would be willing to see a Dr. about medications for her mood; as she reported a history of Bi-polar Disorder, Zunairah refused - stating - she "takes anough pills already."  She appears to isolate in class and wears her earbuds to listen to music rather than socializing with others.  Abigayl also appears irritated to speak to counselor or other staff in this program -more than is typical; which makes it difficult to follow with her on goal setting or progress made.  When asked how she is coping with ongoing stressors at this time; [place] responded - "I just keep moving."  counselor attempted to educate  Sandhya on need to treat the whole person and how much her mood can impact the rest of her health - she appeared disinterested - and also reports she is not in favor or a "lung transplant" and will just keep doing what she is doing.  Counselor made staff aware of this and Leylah's refusal for mental health treatment at this time.    Florita states that everything we went over last time is still the same and there is nothing new to report.  Tajha was open about her struggle with her diagnosis that isn't well known. She is frustrated that most of her doctors admit they don't know much about her rare dx and always just keep referring to Flemming's advice. She feels like some her medicates are countering each other, but no one does anything about it. She was encouraged to write down concerns and keep  track of potential side effects to discuss with Flemming in two months   Expected Outcomes  Short: Contact a mental health professional to establish care related to her current depression and stress. Long: Be able to sleep better each night and manage mental health wellbeing.   short: continue coming to [place]. Long: graduate from [employer].   Neetu refuses mental health recommendations and/or treatment at this time - although she would benefit from it greatly.    Short: attend [facility] regularly to decrease stress. Long: Exercise after [facility] to decrease stress  Short: attend [facility] consistently to help with stress and coping. Long: continue to exercise after graduation and find ways to relax   Interventions  Encouraged to attend Pulmonary Rehabilitation for the exercise  -  -  Encouraged to attend [facility]monary Rehabilitation for the exercise  Encouraged to attend [facility] for the exercise;Relaxation education   Continue Psychosocial Services   Follow up required by counselor  -  -  Follow up required by staff  -      Psychosocial Discharge (Final Psychosocial Re-Evaluation): Psychosocial Re-Evaluation - 11/26/17 1134      Psychosocial Re-Evaluation   Current issues with  Current Depression;History of Depression;Current Sleep Concerns;Current Stress Concerns    Comments  Tangelia was open about her struggle with her diagnosis that isn't well known. She is frustrated that most of her doctors admit they don't know much about her rare dx and always just keep referring to Flemming's advice. She feels like some her medicates are countering each other, but no one does anything about it. She was encouraged to write down concerns and keep track of potential side effects to discuss with Flemming in two months    Expected Outcomes  Short: attend [facility] consistently to help with stress and coping. Long: continue to exercise after graduation and find ways to relax    Interventions   Encouraged to attend [facility] for the exercise;Relaxation education       Education: Education Goals: Education classes will be provided on a weekly basis, covering required topics. Participant will state understanding/return demonstration of topics presented.  Learning Barriers/Preferences: Learning Barriers/Preferences - 08/10/17 1254      Learning Barriers/Preferences   Learning Barriers  Reading In past year it takes longer to comprehend when reading.    Learning Preferences  Individual Instruction       Education Topics:  Initial Evaluation Education: - Verbal, written and demonstration of respiratory meds, oximetry and breathing techniques. Instruction on use of nebulizers and MDIs and importance of monitoring MDI activations.   Pulmonary Rehab from 12/01/2017 in [facility] Cardiac  and Pulmonary Rehab  Date  08/10/17  Educator  SB  Instruction Review Code  1- Verbalizes Understanding      General Nutrition Guidelines/Fats and Fiber: -Group instruction provided by verbal, written material, models and posters to present the general guidelines for heart healthy nutrition. Gives an explanation and review of dietary fats and fiber.   Controlling Sodium/Reading Food Labels: -Group verbal and written material supporting the discussion of sodium use in heart healthy nutrition. Review and explanation with models, verbal and written materials for utilization of the food label.   Pulmonary Rehab from 12/01/2017 in [facility] Cardiac and Pulmonary Rehab  Date  10/11/17  Educator  CR  Instruction Review Code  1- Verbalizes Understanding      Exercise Physiology & General Exercise Guidelines: - Group verbal and written instruction with models to review the exercise physiology of the cardiovascular system and associated critical values. Provides general exercise guidelines with specific guidelines to those with heart or lung disease.    Aerobic Exercise & Resistance Training: -  Gives group verbal and written instruction on the various components of exercise. Focuses on aerobic and resistive training programs and the benefits of this training and how to safely progress through these programs.   Pulmonary Rehab from 12/01/2017 in [facility] Cardiac and Pulmonary Rehab  Date  11/10/17  Educator  [facility]  Instruction Review Code  1- Geologist, engineering, Balance, Mind/Body Relaxation: Provides group verbal/written instruction on the benefits of flexibility and balance training, including mind/body exercise modes such as yoga, pilates and tai chi.  Demonstration and skill practice provided.   Pulmonary Rehab from 12/01/2017 in [facility] Cardiac and Pulmonary Rehab  Date  08/25/17  Educator  AS  Instruction Review Code  1- Verbalizes Understanding      Stress and Anxiety: - Provides group verbal and written instruction about the health risks of elevated stress and causes of high stress.  Discuss the correlation between heart/lung disease and anxiety and treatment options. Review healthy ways to manage with stress and anxiety.   Depression: - Provides group verbal and written instruction on the correlation between heart/lung disease and depressed mood, treatment options, and the stigmas associated with seeking treatment.   Pulmonary Rehab from 12/01/2017 in [facility] Cardiac and Pulmonary Rehab  Date  09/01/17  Educator  [facility]  Instruction Review Code  1- Verbalizes Understanding      Exercise & Equipment Safety: - Individual verbal instruction and demonstration of equipment use and safety with use of the equipment.   Pulmonary Rehab from 12/01/2017 in [facility] Cardiac and Pulmonary Rehab  Date  08/10/17  Educator  Sb  Instruction Review Code  1- Verbalizes Understanding      Infection Prevention: - Provides verbal and written material to individual with discussion of infection control including proper hand washing and proper equipment cleaning during exercise  session.   Pulmonary Rehab from 12/01/2017 in [facility] Cardiac and Pulmonary Rehab  Date  08/10/17  Educator  SB  Instruction Review Code  1- Verbalizes Understanding      Falls Prevention: - Provides verbal and written material to individual with discussion of falls prevention and safety.   Diabetes: - Individual verbal and written instruction to review signs/symptoms of diabetes, desired ranges of glucose level fasting, after meals and with exercise. Advice that pre and post exercise glucose checks will be done for 3 sessions at entry of program.   Chronic Lung Diseases: - Group verbal and written instruction to review  updates, respiratory medications, advancements in procedures and treatments. Discuss use of supplemental oxygen including available portable oxygen systems, continuous and intermittent flow rates, concentrators, personal use and safety guidelines. Review proper use of inhaler and spacers. Provide informative websites for self-education.    Energy Conservation: - Provide group verbal and written instruction for methods to conserve energy, plan and organize activities. Instruct on pacing techniques, use of adaptive equipment and posture/positioning to relieve shortness of breath.   Pulmonary Rehab from 12/01/2017 in [facility] Cardiac and Pulmonary Rehab  Date  12/01/17  Educator  [facility]  Instruction Review Code  1- Verbalizes Understanding      Triggers and Exacerbations: - Group verbal and written instruction to review types of environmental triggers and ways to prevent exacerbations. Discuss weather changes, air quality and the benefits of nasal washing. Review warning signs and symptoms to help prevent infections. Discuss techniques for effective airway clearance, coughing, and vibrations.   AED/CPR: - Group verbal and written instruction with the use of models to demonstrate the basic use of the AED with the basic ABC's of resuscitation.   Pulmonary Rehab from 12/01/2017 in [facility]  Cardiac and Pulmonary Rehab  Date  09/10/17  Educator  [facility]  Instruction Review Code  5- Refused Teaching      Anatomy and Physiology of the Lungs: - Group verbal and written instruction with the use of models to provide basic lung anatomy and physiology related to function, structure and complications of lung disease.   Anatomy & Physiology of the Heart: - Group verbal and written instruction and models provide basic cardiac anatomy and physiology, with the coronary electrical and arterial systems. Review of Valvular disease and Heart Failure   Cardiac Medications: - Group verbal and written instruction to review commonly prescribed medications for heart disease. Reviews the medication, class of the drug, and side effects.   Pulmonary Rehab from 12/01/2017 in [facility] Cardiac and Pulmonary Rehab  Date  11/19/17  Educator  [facility]  Instruction Review Code  5- Refused Teaching [Had to leave early]      Know Your Numbers and Risk Factors: -Group verbal and written instruction about important numbers in your health.  Discussion of what are risk factors and how they play a role in the disease process.  Review of Cholesterol, Blood Pressure, Diabetes, and BMI and the role they play in your overall health.   Sleep Hygiene: -Provides group verbal and written instruction about how sleep can affect your health.  Define sleep hygiene, discuss sleep cycles and impact of sleep habits. Review good sleep hygiene tips.    Other: -Provides group and verbal instruction on various topics (see comments)    Knowledge Questionnaire Score: Knowledge Questionnaire Score - 08/10/17 1255      Knowledge Questionnaire Score   Pre Score  15/18 Reviewed correct responses with Lidia and she verbalized understanding of the responses.  She had no further questions today.        Core Components/Risk Factors/Patient Goals at Admission: Personal Goals and Risk Factors at Admission - 08/10/17 1248      Core  Components/Risk Factors/Patient Goals on Admission    Weight Management  Yes;Obesity    Intervention  Weight Management: Develop a combined nutrition and exercise program designed to reach desired caloric intake, while maintaining appropriate intake of nutrient and fiber, sodium and fats, and appropriate energy expenditure required for the weight goal.;Weight Management/Obesity: Establish reasonable short term and long term weight goals.;Obesity: Provide education and appropriate resources to help participant  work on and attain dietary goals.    Admit Weight  268 lb 12.8 oz (121.9 kg)    Goal Weight: Short Term  265 lb (120.2 kg)    Goal Weight: Long Term  180 lb (81.6 kg)    Expected Outcomes  Short Term: Continue to assess and modify interventions until short term weight is achieved;Long Term: Adherence to nutrition and physical activity/exercise program aimed toward attainment of established weight goal;Weight Loss: Understanding of general recommendations for a balanced deficit meal plan, which promotes 1-2 lb weight loss per week and includes a negative energy balance of [phone] kcal/d    Improve shortness of breath with ADL's  Yes    Intervention  Provide education, individualized exercise plan and daily activity instruction to help decrease symptoms of SOB with activities of daily living.    Expected Outcomes  Short Term: Improve cardiorespiratory fitness to achieve a reduction of symptoms when performing ADLs;Long Term: Be able to perform more ADLs without symptoms or delay the onset of symptoms    Hypertension  Yes    Intervention  Provide education on lifestyle modifcations including regular physical activity/exercise, weight management, moderate sodium restriction and increased consumption of fresh fruit, vegetables, and low fat dairy, alcohol moderation, and smoking cessation.;Monitor prescription use compliance.    Expected Outcomes  Short Term: Continued assessment and intervention until  BP is < 140/19m HG in hypertensive participants. < 130/841mHG in hypertensive participants with diabetes, heart failure or chronic kidney disease.;Long Term: Maintenance of blood pressure at goal levels.    Stress  Yes    Intervention  Offer individual and/or small group education and counseling on adjustment to heart disease, stress management and health-related lifestyle change. Teach and support self-help strategies.;Refer participants experiencing significant psychosocial distress to appropriate mental health specialists for further evaluation and treatment. When possible, include family members and significant others in education/counseling sessions.    Expected Outcomes  Short Term: Participant demonstrates changes in health-related behavior, relaxation and other stress management skills, ability to obtain effective social support, and compliance with psychotropic medications if prescribed.;Long Term: Emotional wellbeing is indicated by absence of clinically significant psychosocial distress or social isolation.       Core Components/Risk Factors/Patient Goals Review:  Goals and Risk Factor Review    Row Name 09/13/17 1045 10/11/17 1047 11/01/17 1049 11/26/17 1125       Core Components/Risk Factors/Patient Goals Review   Personal Goals Review  Weight Management/Obesity;Hypertension;Stress;Improve shortness of breath with ADL's  Weight Management/Obesity;Hypertension;Improve shortness of breath with ADL's;Stress  Weight Management/Obesity;Hypertension;Improve shortness of breath with ADL's  Weight Management/Obesity;Hypertension;Improve shortness of breath with ADL's    Review  RoKamrynntated that she has noticed some improvements in her SOB during daily activities. Her blood pressure as also reported to be under control with diet and exercise/no meds. She stated stress levels are about the same and her weight has also been mantained.   RoEnriqueeports taking all meds and BP is reported to be under  control. Her weight has been maintained but she still has a goal to lose weight. She has been out due to sickness but has retured and started to ease back into her exercise routine.   RoKeryas gained some weight since she has been out of [facility]. She has not changed her eating habits and eats when she feels hungry. Her blood pressure has been within normal Limits. Her shortness of breath with her ADLS have not changed since her attendance has been sporadic.  Cigi is still not happy about her weight, but is trying to change her eating habits. She also has been on steroids on and off for years which doesn't help. Her blood pressure has been within normal limits in [facility]. She has noticed a difference in her SOB, as she has been trying to come more regularly to [facility] and being more attentive to her breathing at home.     Expected Outcomes  Short: Continue attending pulmonary rehab regularly and continue to take all meds, short term goal weight 260lbs  Long: Independently continue exercise routine. Long term goal weight 220 lbs.    Short: Attending pulmonary rehab regularly after being out due to sickness and ease back into previous workloads, and continue to take all meds, short term goal weight 260lbs  Long: Independently continue exercise routine. Long term goal weight 220 lbs.    Short: attend [facility] regularly. Long: maintain attending [facility] to improve overall health.  Short: attend [facility] consistently. Long: graduate [facility]       Core Components/Risk Factors/Patient Goals at Discharge (Final Review):  Goals and Risk Factor Review - 11/26/17 1125      Core Components/Risk Factors/Patient Goals Review   Personal Goals Review  Weight Management/Obesity;Hypertension;Improve shortness of breath with ADL's    Review  Bonny is still not happy about her weight, but is trying to change her eating habits. She also has been on steroids on and off for years which doesn't help. Her blood  pressure has been within normal limits in [facility]. She has noticed a difference in her SOB, as she has been trying to come more regularly to [facility] and being more attentive to her breathing at home.     Expected Outcomes  Short: attend [facility] consistently. Long: graduate [facility]       ITP Comments: ITP Comments    Row Name 08/10/17 1235 08/10/17 1301 08/23/17 0921 09/20/17 0828 09/27/17 1635   ITP Comments  Medical review completed today   ITP sent to Dr Loleta Chance for review, changes as needed and signature.  Documentation of diagnosis can be found in [facility] encounter1/14/2019  Waleska is working on weight loss to be eligible to talk to the lung transplant team.   30 day review completed. ITP sent to Dr. Emily Filbert Director of [place]. Continue with ITP unless changes are made by physician.  30 day review completed. ITP sent to Dr. Emily Filbert Director of [place]. Continue with ITP unless changes are made by physician.  Left message for Suanne Marker to call back. She has not attended class since 09/17/17.   Row Name 10/06/17 1458 10/18/17 0837 11/15/17 0830 12/13/17 0827 12/15/17 1226   ITP Comments  Oval has no attended since 09/17/17.  I left a VM about returning to class.   30 day review completed. ITP sent to Dr. Emily Filbert Director of [place]. Continue with ITP unless changes are made by physician   30 day review completed. ITP sent to Dr. Emily Filbert Director of [place]. Continue with ITP unless changes are made by physician   30 day review completed. ITP sent to Dr. Emily Filbert Director of [place]. Continue with ITP unless changes are made by physician  Pt has not attended since last exercise update    [place] Name 01/10/18 0952           ITP Comments  Patient has not attended [facility] regularly. All goals not obtained. 30 day review completed. ITP sent to Dr. Emily Filbert Director of [place].  Continue with ITP unless changes are made by physician          Comments: 30 day review

## 2018-01-11 ENCOUNTER — Telehealth: Payer: Self-pay

## 2018-01-11 NOTE — Telephone Encounter (Signed)
Called Andreika to see if she would be returning to [place]. Left Message for her to call back.

## 2018-01-20 DIAGNOSIS — J45909 Unspecified asthma, uncomplicated: Secondary | ICD-10-CM

## 2018-01-20 NOTE — Progress Notes (Signed)
Pulmonary Individual Treatment Plan  Patient Details  Name: Lisa [name] MRN: [id number] Date of Birth: [birth year] Referring Provider:     Pulmonary Rehab from 08/10/2017 in [facility] Cardiac and Pulmonary Rehab  Referring Provider  Raul Del      Initial Encounter Date:    Pulmonary Rehab from 08/10/2017 in [facility] Cardiac and Pulmonary Rehab  Date  08/10/17      Visit Diagnosis: Uncomplicated asthma, unspecified asthma severity, unspecified whether persistent  Patient's Home Medications on Admission:  Current Outpatient Medications:  .  albuterol (PROVENTIL HFA;VENTOLIN HFA) 108 (90 Base) MCG/ACT inhaler, Inhale 2 puffs into the lungs every 6 (six) hours as needed for wheezing or shortness of breath., Disp: 1 Inhaler, Rfl: 2 .  azaTHIOprine (IMURAN) 50 MG tablet, Take by mouth., Disp: , Rfl:  .  ferrous sulfate 325 (65 FE) MG tablet, Take 325 mg by mouth daily with breakfast., Disp: , Rfl:  .  gabapentin (NEURONTIN) 100 MG capsule, Take by mouth., Disp: , Rfl:  .  Ipratropium-Albuterol (COMBIVENT) 20-100 MCG/ACT AERS respimat, Inhale 1 puff into the lungs every 6 (six) hours., Disp: , Rfl:  .  montelukast (SINGULAIR) 10 MG tablet, Take by mouth., Disp: , Rfl:  .  omeprazole (PRILOSEC) 40 MG capsule, Take by mouth., Disp: , Rfl:  .  phentermine 37.5 MG capsule, Take by mouth., Disp: , Rfl:  .  predniSONE (DELTASONE) 10 MG tablet, Take 20 mg by mouth daily., Disp: , Rfl:   Past Medical History: Past Medical History:  Diagnosis Date  . Abdominal pain   . Anemia    hx of anemic  . Asthma   . Bipolar 1 disorder ([place])   . Blood in stool   . Change in voice   . Chest pain   . Constipation   . Depression   . Diverticulitis   . DVT (deep venous thrombosis) ([place]) 12/13/2014  . Dysrhythmia   . Fatigue   . Gallstones 04/10/2011   with biliary pancreatitis  . GERD (gastroesophageal reflux disease)   . Migraines   . MRSA carrier   . Nausea   . Pancreatitis   . PTSD (post-traumatic  stress disorder)   . Pulmonary embolism ([place]) 12/13/2014  . Sleep apnea    cpap  . Trouble swallowing     Tobacco Use: Social History   Tobacco Use  Smoking Status Never Smoker  Smokeless Tobacco Never Used    Labs: Recent Review Scientist, physiological    Labs for ITP Cardiac and Pulmonary Rehab Latest Ref Rng & Units 03/18/2014   Hemoglobin A1c 4.2 - 6.3 % 5.6       Pulmonary Assessment Scores: Pulmonary Assessment Scores    Row Name 08/10/17 1257         ADL UCSD   ADL Phase  Entry     SOB Score total  98     Rest  2     Walk  4     Stairs  5     Bath  4     Dress  3     Shop  4       CAT Score   CAT Score  39       mMRC Score   mMRC Score  2        Pulmonary Function Assessment: Pulmonary Function Assessment - 08/10/17 1256      Breath   Shortness of Breath  Fear of Shortness of Breath;Yes;Limiting activity  Exercise Target Goals:    Exercise Program Goal: Individual exercise prescription set using results from initial 6 min walk test and THRR while considering  patient's activity barriers and safety.    Exercise Prescription Goal: Initial exercise prescription builds to 30-45 minutes a day of aerobic activity, 2-3 days per week.  Home exercise guidelines will be given to patient during program as part of exercise prescription that the participant will acknowledge.  Activity Barriers & Risk Stratification: Activity Barriers & Cardiac Risk Stratification - 08/10/17 1247      Activity Barriers & Cardiac Risk Stratification   Activity Barriers  Back Problems;Shortness of Breath       6 Minute Walk: 6 Minute Walk    Row Name 08/10/17 1318         6 Minute Walk   Phase  Initial     Distance  1250 feet     Walk Time  6 minutes     # of Rest Breaks  0     MPH  2.37     METS  4.13     RPE  19     Perceived Dyspnea   4     VO2 Peak  14.47     Symptoms  Yes (comment)     Comments  back pain 7/10     Resting HR  77 bpm     Resting BP   126/76     Resting Oxygen Saturation   98 %     Exercise Oxygen Saturation  during 6 min walk  91 %     Max Ex. HR  138 bpm     Max Ex. BP  162/86     2 Minute Post BP  138/84       Interval HR   1 Minute HR  112     2 Minute HR  133     3 Minute HR  136     4 Minute HR  134     5 Minute HR  138     6 Minute HR  115     2 Minute Post HR  91     Interval Heart Rate?  Yes       Interval Oxygen   Interval Oxygen?  Yes     Baseline Oxygen Saturation %  98 %     1 Minute Liters of Oxygen  0 L     2 Minute Oxygen Saturation %  91 %     2 Minute Liters of Oxygen  0 L     3 Minute Oxygen Saturation %  94 %     3 Minute Liters of Oxygen  0 L     4 Minute Oxygen Saturation %  95 %     4 Minute Liters of Oxygen  0 L     5 Minute Oxygen Saturation %  94 %     5 Minute Liters of Oxygen  0 L     6 Minute Oxygen Saturation %  95 %     6 Minute Liters of Oxygen  0 L     2 Minute Post Oxygen Saturation %  100 %     2 Minute Post Liters of Oxygen  0 L       Oxygen Initial Assessment: Oxygen Initial Assessment - 08/10/17 1238      Home Oxygen   Home Oxygen Device  Home Concentrator    Sleep Oxygen Prescription  Continuous;CPAP  Liters per minute  2    Home Exercise Oxygen Prescription  None    Home at Rest Exercise Oxygen Prescription  None    Compliance with Home Oxygen Use  Yes      Initial 6 min Walk   Oxygen Used  None      Program Oxygen Prescription   Program Oxygen Prescription  None      Intervention   Short Term Goals  To learn and exhibit compliance with exercise, home and travel O2 prescription;To learn and understand importance of maintaining oxygen saturations>88%;To learn and demonstrate proper use of respiratory medications;To learn and understand importance of monitoring SPO2 with pulse oximeter and demonstrate accurate use of the pulse oximeter.;To learn and demonstrate proper pursed lip breathing techniques or other breathing techniques.    Long  Term Goals   Exhibits compliance with exercise, home and travel O2 prescription;Verbalizes importance of monitoring SPO2 with pulse oximeter and return demonstration;Maintenance of O2 saturations>88%;Exhibits proper breathing techniques, such as pursed lip breathing or other method taught during program session;Compliance with respiratory medication;Demonstrates proper use of MDI's       Oxygen Re-Evaluation: Oxygen Re-Evaluation    Row Name 08/16/17 1029 09/13/17 1105 10/11/17 1051 11/01/17 1035 11/26/17 1138     Program Oxygen Prescription   Program Oxygen Prescription  -  None  None  None  None     Home Oxygen   Home Oxygen Device  -  Home Concentrator  Home Concentrator  Home Concentrator  Home Concentrator   Sleep Oxygen Prescription  -  Continuous  Continuous  Continuous  Continuous   Liters per minute  -  '2  2  2  2   ' Home Exercise Oxygen Prescription  -  None  None  None  None   Home at Rest Exercise Oxygen Prescription  -  None  None  None  None   Compliance with Home Oxygen Use  -  Yes  Yes  Yes  Yes     Goals/Expected Outcomes   Short Term Goals  To learn and exhibit compliance with exercise, home and travel O2 prescription;To learn and understand importance of maintaining oxygen saturations>88%;To learn and demonstrate proper use of respiratory medications;To learn and understand importance of monitoring SPO2 with pulse oximeter and demonstrate accurate use of the pulse oximeter.;To learn and demonstrate proper pursed lip breathing techniques or other breathing techniques.  To learn and exhibit compliance with exercise, home and travel O2 prescription;To learn and understand importance of maintaining oxygen saturations>88%;To learn and demonstrate proper use of respiratory medications;To learn and understand importance of monitoring SPO2 with pulse oximeter and demonstrate accurate use of the pulse oximeter.;To learn and demonstrate proper pursed lip breathing techniques or other breathing  techniques.  To learn and exhibit compliance with exercise, home and travel O2 prescription;To learn and understand importance of maintaining oxygen saturations>88%;To learn and demonstrate proper use of respiratory medications;To learn and understand importance of monitoring SPO2 with pulse oximeter and demonstrate accurate use of the pulse oximeter.;To learn and demonstrate proper pursed lip breathing techniques or other breathing techniques.  To learn and exhibit compliance with exercise, home and travel O2 prescription;To learn and understand importance of maintaining oxygen saturations>88%;To learn and demonstrate proper use of respiratory medications;To learn and understand importance of monitoring SPO2 with pulse oximeter and demonstrate accurate use of the pulse oximeter.;To learn and demonstrate proper pursed lip breathing techniques or other breathing techniques.  To learn and exhibit compliance with exercise, home and travel  O2 prescription;To learn and understand importance of maintaining oxygen saturations>88%;To learn and demonstrate proper use of respiratory medications;To learn and understand importance of monitoring SPO2 with pulse oximeter and demonstrate accurate use of the pulse oximeter.;To learn and demonstrate proper pursed lip breathing techniques or other breathing techniques.   Long  Term Goals  Exhibits compliance with exercise, home and travel O2 prescription;Verbalizes importance of monitoring SPO2 with pulse oximeter and return demonstration;Maintenance of O2 saturations>88%;Exhibits proper breathing techniques, such as pursed lip breathing or other method taught during program session;Compliance with respiratory medication;Demonstrates proper use of MDI's  Exhibits compliance with exercise, home and travel O2 prescription;Verbalizes importance of monitoring SPO2 with pulse oximeter and return demonstration;Maintenance of O2 saturations>88%;Exhibits proper breathing techniques,  such as pursed lip breathing or other method taught during program session;Compliance with respiratory medication;Demonstrates proper use of MDI's  Exhibits compliance with exercise, home and travel O2 prescription;Verbalizes importance of monitoring SPO2 with pulse oximeter and return demonstration;Maintenance of O2 saturations>88%;Exhibits proper breathing techniques, such as pursed lip breathing or other method taught during program session;Compliance with respiratory medication;Demonstrates proper use of MDI's  Exhibits compliance with exercise, home and travel O2 prescription;Verbalizes importance of monitoring SPO2 with pulse oximeter and return demonstration;Maintenance of O2 saturations>88%;Exhibits proper breathing techniques, such as pursed lip breathing or other method taught during program session;Compliance with respiratory medication;Demonstrates proper use of MDI's  Exhibits compliance with exercise, home and travel O2 prescription;Verbalizes importance of monitoring SPO2 with pulse oximeter and return demonstration;Maintenance of O2 saturations>88%;Exhibits proper breathing techniques, such as pursed lip breathing or other method taught during program session;Compliance with respiratory medication;Demonstrates proper use of MDI's   Comments  Reviewed PLB technique with pt.  Talked about how it work and it's important to maintaining his exercise saturations.  Lisa [name] stated that she does use PLB at home to help control SOB, and has noticed some improvements in SOB. She does not currently have a pulse oximeter or monitor her oxygen saturations. She was advised to purchase a pulse ox and use it at home.   Lisa [name] had been out sick and her SOB was worse duing her sickness, but has now improved since getting better and back to class. She did not yet buy a pulse oximeter to monitor her oxygen at home but was encouraged to do so again.   Lisa [name] is taking predinisone, combivent and albuterol as prescribed.  She has no questions about her medicatons. She uses her PLB when she gets short of breath in class and at home. She has not obtained a pulse oximeter yet.  Her oxygen has been above 88 percent on all her exercises and at rest.   Lisa [name] is taking her medications appropriately. She is still on prednisone. Staff encouraged her to be mindful about potential side effects that affect her appetite and mood. Her oxygen is stable while in class.    Goals/Expected Outcomes  Short: Become more profiecient at using PLB.   Long: Become independent at using PLB.  Short: Purchase a pulse oxymeter and start monitoring oxygen saturations at home. Long: Become independent monitoring oxygen saturations and using PLB at home.   Short: Purchase a pulse oxymeter and start monitoring oxygen saturations at home. Long: Become independent monitoring oxygen saturations and using PLB at home.   Short: continue to exercise to reduce oxygen need. Long: maintain exercise to keep oxygen needs above 88 percent.  Short: continue to exercise to help with SOB and oxygen need, keep track of side effects of respiratory medications  to monitor a trend in adverse reactions. Long: become indpendent with managing oxygen needs and continue to manage her medications independently       Oxygen Discharge (Final Oxygen Re-Evaluation): Oxygen Re-Evaluation - 11/26/17 1138      Program Oxygen Prescription   Program Oxygen Prescription  None      Home Oxygen   Home Oxygen Device  Home Concentrator    Sleep Oxygen Prescription  Continuous    Liters per minute  2    Home Exercise Oxygen Prescription  None    Home at Rest Exercise Oxygen Prescription  None    Compliance with Home Oxygen Use  Yes      Goals/Expected Outcomes   Short Term Goals  To learn and exhibit compliance with exercise, home and travel O2 prescription;To learn and understand importance of maintaining oxygen saturations>88%;To learn and demonstrate proper use of respiratory  medications;To learn and understand importance of monitoring SPO2 with pulse oximeter and demonstrate accurate use of the pulse oximeter.;To learn and demonstrate proper pursed lip breathing techniques or other breathing techniques.    Long  Term Goals  Exhibits compliance with exercise, home and travel O2 prescription;Verbalizes importance of monitoring SPO2 with pulse oximeter and return demonstration;Maintenance of O2 saturations>88%;Exhibits proper breathing techniques, such as pursed lip breathing or other method taught during program session;Compliance with respiratory medication;Demonstrates proper use of MDI's    Comments  Lisa [name] is taking her medications appropriately. She is still on prednisone. Staff encouraged her to be mindful about potential side effects that affect her appetite and mood. Her oxygen is stable while in class.     Goals/Expected Outcomes  Short: continue to exercise to help with SOB and oxygen need, keep track of side effects of respiratory medications to monitor a trend in adverse reactions. Long: become indpendent with managing oxygen needs and continue to manage her medications independently        Initial Exercise Prescription: Initial Exercise Prescription - 08/10/17 1300      Date of Initial Exercise RX and Referring Provider   Date  08/10/17    Referring Provider  Raul Del      Treadmill   MPH  2.3    Grade  0.5    Minutes  15    METs  2.92      NuStep   Level  2    SPM  80    Minutes  15    METs  3      REL-XR   Level  2    Speed  50    Minutes  15    METs  3      Biostep-RELP   Level  2    SPM  50    Minutes  15    METs  3      Prescription Details   Frequency (times per week)  3    Duration  Progress to 45 minutes of aerobic exercise without signs/symptoms of physical distress      Intensity   THRR 40-80% of Max Heartrate  116-156    Ratings of Perceived Exertion  11-13    Perceived Dyspnea  0-4      Resistance Training   Training  Prescription  Yes    Weight  3 lb    Reps  10-15       Perform Capillary Blood Glucose checks as needed.  Exercise Prescription Changes: Exercise Prescription Changes    Row Name 08/25/17 1100 08/25/17 1300 09/10/17  0800 10/20/17 1400 11/03/17 1200     Response to Exercise   Blood Pressure (Admit)  -  146/84  106/64  132/80  122/80   Blood Pressure (Exercise)  -  128/64  -  -  -   Blood Pressure (Exit)  -  126/84  132/82  126/78  132/78   Heart Rate (Admit)  -  82 bpm  102 bpm  115 bpm  115 bpm   Heart Rate (Exercise)  -  109 bpm  132 bpm  138 bpm  128 bpm   Heart Rate (Exit)  -  82 bpm  98 bpm  120 bpm  96 bpm   Oxygen Saturation (Admit)  -  99 %  98 %  97 %  95 %   Oxygen Saturation (Exercise)  -  94 %  94 %  91 %  92 %   Oxygen Saturation (Exit)  -  97 %  98 %  98 %  98 %   Rating of Perceived Exertion (Exercise)  -  '13  14  14  14   ' Perceived Dyspnea (Exercise)  -  '3  3  3  3   ' Symptoms  -  none  none  non2  none   Duration  -  Continue with 45 min of aerobic exercise without signs/symptoms of physical distress.  Continue with 45 min of aerobic exercise without signs/symptoms of physical distress.  Continue with 45 min of aerobic exercise without signs/symptoms of physical distress.  Continue with 45 min of aerobic exercise without signs/symptoms of physical distress.   Intensity  -  THRR unchanged  THRR unchanged  THRR unchanged  THRR unchanged     Progression   Progression  -  Continue to progress workloads to maintain intensity without signs/symptoms of physical distress.  Continue to progress workloads to maintain intensity without signs/symptoms of physical distress.  Continue to progress workloads to maintain intensity without signs/symptoms of physical distress.  Continue to progress workloads to maintain intensity without signs/symptoms of physical distress.   Average METs  -  2.7  3.3  2.94  2.8     Resistance Training   Training Prescription  -  Yes  Yes  Yes  Yes    Weight  -  3 lb  3 lb  3 lb  3 lb   Reps  -  10-15  10-15  10-15  10-15     Interval Training   Interval Training  -  No  No  -  No     Treadmill   MPH  -  2.3  2.3  2.3  2.3   Grade  -  0.5  0.5  0.5  0.5   Minutes  -  '15  15  15  15   ' METs  -  2.92  2.92  2.92  2.92     NuStep   Level  -  2  -  3  3   SPM  -  80  -  80  80   Minutes  -  15  -  15  15   METs  -  2.5  -  2.8  3     REL-XR   Level  -  -  '5  5  2   ' Speed  -  -  71  -  -   Minutes  -  -  '15  15  15   ' METs  -  -  3.7  3.1  2.4     Home Exercise Plan   Plans to continue exercise at  [employer] (comment) [place] (comment) [employer] (comment) [employer]comment[employer]   Frequency  Add 1 additional day to program exercise sessions.  -  Add 1 additional day to program exercise sessions.  Add 1 additional day to program exercise sessions.  Add 1 additional day to program exercise sessions.   Initial Home Exercises Provided  08/25/17  -  08/25/17  08/25/17  08/25/17   Row Name 11/17/17 1500 12/01/17 1200 12/29/17 1500         Response to Exercise   Blood Pressure (Admit)  114/74  134/60  142/84     Blood Pressure (Exit)  130/74  138/84  138/80     Heart Rate (Admit)  104 bpm  125 bpm  83 bpm     Heart Rate (Exercise)  114 bpm  137 bpm  107 bpm     Heart Rate (Exit)  72 bpm  84 bpm  70 bpm     Oxygen Saturation (Admit)  96 %  95 %  99 %     Oxygen Saturation (Exercise)  95 %  94 %  97 %     Oxygen Saturation (Exit)  98 %  98 %  99 %     Rating of Perceived Exertion (Exercise)  '14  13  13     ' Perceived Dyspnea (Exercise)  '3  2  2     ' Symptoms  none  none  none     Duration  Continue with 45 min of aerobic exercise without signs/symptoms of physical distress.  Continue with 45 min of aerobic exercise without signs/symptoms of physical distress.  Continue with 45 min of aerobic exercise without signs/symptoms of physical distress.     Intensity  THRR unchanged  THRR  unchanged  THRR unchanged       Progression   Progression  Continue to progress workloads to maintain intensity without signs/symptoms of physical distress.  Continue to progress workloads to maintain intensity without signs/symptoms of physical distress.  Continue to progress workloads to maintain intensity without signs/symptoms of physical distress.     Average METs  3.6  3.3  4       Resistance Training   Training Prescription  Yes  Yes  Yes     Weight  3 lb  3 lb  3 lb     Reps  10-15  10-15  10-15       Interval Training   Interval Training  No  No  No       Treadmill   MPH  2.3  -  2.3     Grade  0.5  -  0.5     Minutes  15  -  15     METs  2.92  -  2.92       NuStep   Level  '3  4  4     ' SPM  88  110  108     Minutes  '15  15  15     ' METs  3.1  3.2  2.9       REL-XR   Level  '5  5  5     ' Speed  60  65  65     Minutes  '15  15  15     ' METs  4.7  3.4  4.9       Home Exercise Plan   Plans to continue exercise at  [employer] (comment) [place] (comment) Gym     Frequency  Add 1 additional day to program exercise sessions.  -  Add 1 additional day to program exercise sessions.     Initial Home Exercises Provided  08/25/17  -  -        Exercise Comments: Exercise Comments    Row Name 08/16/17 1028           Exercise Comments  First full day of exercise!  Patient was oriented to gym and equipment including functions, settings, policies, and procedures.  Patient's individual exercise prescription and treatment plan were reviewed.  All starting workloads were established based on the results of the 6 minute walk test done at initial orientation visit.  The plan for exercise progression was also introduced and progression will be customized based on patient's performance and goals.          Exercise Goals and Review: Exercise Goals    Row Name 08/10/17 1314             Exercise Goals   Increase Physical Activity  Yes       Intervention   Provide advice, education, support and counseling about physical activity/exercise needs.;Develop an individualized exercise prescription for aerobic and resistive training based on initial evaluation findings, risk stratification, comorbidities and participant's personal goals.       Expected Outcomes  Short Term: Attend rehab on a regular basis to increase amount of physical activity.;Long Term: Exercising regularly at least 3-5 days a week.       Increase Strength and Stamina  Yes       Intervention  Provide advice, education, support and counseling about physical activity/exercise needs.;Develop an individualized exercise prescription for aerobic and resistive training based on initial evaluation findings, risk stratification, comorbidities and participant's personal goals.       Expected Outcomes  Short Term: Increase workloads from initial exercise prescription for resistance, speed, and METs.;Long Term: Improve cardiorespiratory fitness, muscular endurance and strength as measured by increased METs and functional capacity (6MWT);Short Term: Perform resistance training exercises routinely during rehab and add in resistance training at home       Able to understand and use rate of perceived exertion (RPE) scale  Yes       Intervention  Provide education and explanation on how to use RPE scale       Expected Outcomes  Short Term: Able to use RPE daily in rehab to express subjective intensity level;Long Term:  Able to use RPE to guide intensity level when exercising independently       Able to understand and use Dyspnea scale  Yes       Intervention  Provide education and explanation on how to use Dyspnea scale       Expected Outcomes  Short Term: Able to use Dyspnea scale daily in rehab to express subjective sense of shortness of breath during exertion;Long Term: Able to use Dyspnea scale to guide intensity level when exercising independently       Knowledge and understanding of Target Heart Rate  Range (THRR)  Yes       Intervention  Provide education and explanation of THRR including how the numbers were predicted and where they are located for reference       Expected Outcomes  Short Term: Able to state/look  up THRR;Short Term: Able to use daily as guideline for intensity in rehab;Long Term: Able to use THRR to govern intensity when exercising independently       Able to check pulse independently  Yes       Intervention  Provide education and demonstration on how to check pulse in carotid and radial arteries.;Review the importance of being able to check your own pulse for safety during independent exercise       Expected Outcomes  Short Term: Able to explain why pulse checking is important during independent exercise;Long Term: Able to check pulse independently and accurately       Understanding of Exercise Prescription  Yes       Intervention  Provide education, explanation, and written materials on patient's individual exercise prescription       Expected Outcomes  Short Term: Able to explain program exercise prescription;Long Term: Able to explain home exercise prescription to exercise independently          Exercise Goals Re-Evaluation : Exercise Goals Re-Evaluation    Row Name 08/16/17 1028 08/25/17 1138 09/10/17 0814 10/11/17 1103 10/20/17 1456     Exercise Goal Re-Evaluation   Exercise Goals Review  Understanding of Exercise Prescription;Able to understand and use Dyspnea scale;Knowledge and understanding of Target Heart Rate Range (THRR);Able to understand and use rate of perceived exertion (RPE) scale  Increase Physical Activity;Increase Strength and Stamina  Increase Physical Activity;Able to understand and use rate of perceived exertion (RPE) scale;Increase Strength and Stamina;Able to understand and use Dyspnea scale  Increase Physical Activity;Able to understand and use rate of perceived exertion (RPE) scale;Increase Strength and Stamina;Able to understand and use Dyspnea  scale  Increase Physical Activity;Able to understand and use rate of perceived exertion (RPE) scale;Increase Strength and Stamina;Able to understand and use Dyspnea scale   Comments  Reviewed RPE scale, THR and program prescription with pt today.  Pt voiced understanding and was given a copy of goals to take home.   Reviewed home exercise with pt today.  Pt plans to go the gym 1 extra day for exercise.  Reviewed THR, pulse, RPE, sign and symptoms, NTG use, and when to call 911 or MD.  Also discussed weather considerations and indoor options.  Pt voiced understanding.  Pt voiced understanding.  Lisa [name] is tolerating exercise well.  She has increased level on the XR and reaches her THR range.  Staff will continue to monitor.  Has been out sick and just returned to class. Working back up to previous workolads.   Lisa [name] tolerates exercise well.  She would benefit from regular attendance 3 times per week.   Expected Outcomes  Short: Use RPE daily to regulate intensity.  Long: Follow program prescription in THR.  Short: add 1 extra day of exercise. Long: maintain and add more days to home exercise.  Short - Reatha will contiinue to attend three times per week Long - Iesha will increase overall MET level  Short: Return to previous workloads prior to being sick and become consistant with attendance since she is no longer sick. Long: Coraline will increase overall MET level and be consistant with her exercise to help in weight loss.   Short - Chaquita will attend regularly Long - Lisa [name] will maintain exercise on her own   [place] Name 11/03/17 1246 11/17/17 1519 12/01/17 1204 12/29/17 1547 01/12/18 1045     Exercise Goal Re-Evaluation   Exercise Goals Review  Increase Physical Activity;Able to understand and use rate of perceived exertion (  RPE) scale;Increase Strength and Stamina;Able to understand and use Dyspnea scale  Increase Physical Activity;Able to understand and use rate of perceived exertion (RPE) scale;Increase Strength and  Stamina;Able to understand and use Dyspnea scale  Increase Physical Activity;Able to understand and use rate of perceived exertion (RPE) scale;Knowledge and understanding of Target Heart Rate Range (THRR);Increase Strength and Stamina;Able to understand and use Dyspnea scale  Increase Physical Activity;Able to understand and use rate of perceived exertion (RPE) scale;Increase Strength and Stamina;Able to understand and use Dyspnea scale  -   Comments  Lisa [name] has attended only 3 sessions in April so far.  She will see more progress with regular attendance.  Lisa [name] would progress more with regular attendance  Lisa [name] has increased resistance on NS since last review.  Staff will monitor progress.  Pt continues to tolerate exercise well  Out since last review   Expected Outcomes  Short - Lisa [name] will attend class on a consistent basis 3 times per week Long - Lisa [name] will imporve overall MET level  [employer] will attend consistently 2-3 times per week Long - Pt will maintain exercise on her own  Short - Lisa [name] will exercise at home on days she doesnt attend class Long - Lisa [name] will exercise independently  Short - complete LW program Long - maintain exercise on her own  -      Discharge Exercise Prescription (Final Exercise Prescription Changes): Exercise Prescription Changes - 12/29/17 1500      Response to Exercise   Blood Pressure (Admit)  142/84    Blood Pressure (Exit)  138/80    Heart Rate (Admit)  83 bpm    Heart Rate (Exercise)  107 bpm    Heart Rate (Exit)  70 bpm    Oxygen Saturation (Admit)  99 %    Oxygen Saturation (Exercise)  97 %    Oxygen Saturation (Exit)  99 %    Rating of Perceived Exertion (Exercise)  13    Perceived Dyspnea (Exercise)  2    Symptoms  none    Duration  Continue with 45 min of aerobic exercise without signs/symptoms of physical distress.    Intensity  THRR unchanged      Progression   Progression  Continue to progress workloads to maintain intensity without  signs/symptoms of physical distress.    Average METs  4      Resistance Training   Training Prescription  Yes    Weight  3 lb    Reps  10-15      Interval Training   Interval Training  No      Treadmill   MPH  2.3    Grade  0.5    Minutes  15    METs  2.92      NuStep   Level  4    SPM  108    Minutes  15    METs  2.9      REL-XR   Level  5    Speed  65    Minutes  15    METs  4.9      Home Exercise Plan   Plans to continue exercise at  [employer] (comment) Gym    Frequency  Add 1 additional day to program exercise sessions.       Nutrition:  Target Goals: Understanding of nutrition guidelines, daily intake of sodium <1536m, cholesterol <2022m calories 30% from fat and 7% or less from saturated fats, daily to have 5 or  more servings of fruits and vegetables.  Biometrics: Pre Biometrics - 08/10/17 1313      Pre Biometrics   Height  '5\' 8"'  (1.727 m)    Weight  268 lb 12.8 oz (121.9 kg)    Waist Circumference  42 inches    Hip Circumference  44.5 inches    Waist to Hip Ratio  0.94 %    BMI (Calculated)  40.88        Nutrition Therapy Plan and Nutrition Goals: Nutrition Therapy & Goals - 09/13/17 1059      Intervention Plan   Expected Outcomes  Short Term Goal: Understand basic principles of dietary content, such as calories, fat, sodium, cholesterol and nutrients.;Short Term Goal: A plan has been developed with personal nutrition goals set during dietitian appointment.;Long Term Goal: Adherence to prescribed nutrition plan.       Nutrition Assessments: Nutrition Assessments - 08/10/17 1247      MEDFICTS Scores   Pre Score  46       Nutrition Goals Re-Evaluation: Nutrition Goals Re-Evaluation    Row Name 09/13/17 1100 10/11/17 1056 11/01/17 1039 11/26/17 1132       Goals   Current Weight  269 lb (122 kg)  272 lb (123.4 kg)  273 lb 11.2 oz (124.1 kg)  266 lb (120.7 kg)    Nutrition Goal  Look for dietary sources of Vitamin B12 such as egg  yolks, mushrooms, nutritional yeast  -  lose weight, Have more energy.  Lose weight    Comment  Mercedes met with the dietician and set initial nutrition goals that she stated she is already following much of.   Lisa [name] stated that she is eating when she feels like it and is trying to follow healthy eating guidelines.   She states that her dietician appointment was pointless. She has not had an appitite this morning and did not eat breakfast.  Lisa [name] was encouraged to come to the General Nutrition class coming up next week in [facility] to see if it is more helpful. She still has no appetite, but is working on trying to snack appropriately     Expected Outcome  Short Term Goal: Understand basic principles of dietary content, such as calories, fat, sodium, cholesterol and nutrients.;Short Term Goal: A plan has been developed with personal nutrition goals set during dietitian appointment.;Long Term Goal: Adherence to prescribed nutrition plan.;  -  Short: make better eating choices to lose weight. Long: lose weight and maintain weight loss.  Short: attend General Nutrition class. long: lose weight to get to goal weight and maintain it.       Personal Goal #2 Re-Evaluation   Personal Goal #2  Work on incorporating a "mini" meal or snack during the first part of the day to help get you on a more regular eating schedule; aim for 200kcal extra per day to start  Short: get into consistance healthy eating pattern.  weight goal of 260 lbs  Long: Goal weight 220 lbs.   -  -       Nutrition Goals Discharge (Final Nutrition Goals Re-Evaluation): Nutrition Goals Re-Evaluation - 11/26/17 1132      Goals   Current Weight  266 lb (120.7 kg)    Nutrition Goal  Lose weight    Comment  Lisa [name] was encouraged to come to the General Nutrition class coming up next week in [facility] to see if it is more helpful. She still has no appetite, but is working on trying to  snack appropriately     Expected Outcome  Short: attend General  Nutrition class. long: lose weight to get to goal weight and maintain it.        Psychosocial: Target Goals: Acknowledge presence or absence of significant depression and/or stress, maximize coping skills, provide positive support system. Participant is able to verbalize types and ability to use techniques and skills needed for reducing stress and depression.   Initial Review & Psychosocial Screening: Initial Psych Review & Screening - 08/10/17 1249      Initial Review   Current issues with  Current Depression;History of Depression;Current Sleep Concerns;Current Stress Concerns    Source of Stress Concerns  Chronic Illness;Family;Transportation;Financial;Unable to perform yard/household activities;Unable to participate in former interests or hobbies    Comments  Currently sleeping only 2-4 hours. Waking up from coughing and not able to immediately go back to sleep. Applied for disability and was denied, now working with a Chief Executive Officer to reapply for disability benefits. Not able to manage any ADL's without needing to stop frequently because so short of breath      Family Dynamics   Good Support System?  Yes      Barriers   Psychosocial barriers to participate in program  There are no identifiable barriers or psychosocial needs.;The patient should benefit from training in stress management and relaxation.      Screening Interventions   Interventions  Encouraged to exercise;Provide feedback about the scores to participant;Program counselor consult;To provide support and resources with identified psychosocial needs    Expected Outcomes  Short Term goal: Utilizing psychosocial counselor, staff and physician to assist with identification of specific Stressors or current issues interfering with healing process. Setting desired goal for each stressor or current issue identified.;Long Term Goal: Stressors or current issues are controlled or eliminated.;Short Term goal: Identification and review with  participant of any Quality of Life or Depression concerns found by scoring the questionnaire.;Long Term goal: The participant improves quality of Life and PHQ9 Scores as seen by post scores and/or verbalization of changes       Quality of Life Scores:  Scores of 19 and below usually indicate a poorer quality of life in these areas.  A difference of  2-3 points is a clinically meaningful difference.  A difference of 2-3 points in the total score of the Quality of Life Index has been associated with significant improvement in overall quality of life, self-image, physical symptoms, and general health in studies assessing change in quality of life.  PHQ-9: Recent Review Flowsheet Data    Depression screen [facility] 2/9 08/10/2017   Decreased Interest 3   Down, Depressed, Hopeless 2   PHQ - 2 Score 5   Altered sleeping 3   Tired, decreased energy 3   Change in appetite 3   Feeling bad or failure about yourself  2   Trouble concentrating 3   Moving slowly or fidgety/restless 3   Suicidal thoughts 0   PHQ-9 Score 22   Difficult doing work/chores Extremely dIfficult     Interpretation of Total Score  Total Score Depression Severity:  1-4 = Minimal depression, 5-9 = Mild depression, 10-14 = Moderate depression, 15-19 = Moderately severe depression, 20-27 = Severe depression   Psychosocial Evaluation and Intervention: Psychosocial Evaluation - 08/18/17 1133      Psychosocial Evaluation & Interventions   Interventions  Encouraged to exercise with the program and follow exercise prescription;Stress management education;Relaxation education;Therapist referral    Comments  Counselor met with Lacora today  for initial psychsocial evaluation.  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  She has a strong support system with family close by and (3) children in the home - (1) who is 70 years old.  Lanisa has struggled with GERD; Sleep Apnea and  multiple diagnoses of mood disorders over the years.  She reports sleeping only ~4 hours/ night sitting up with Oxygen.  Her appetite varies.  She reports having Bipolar in the past and is not on any current medications for this - as she "just moved back to [place] from [place]" and hasn't put providers in place yet.  Counselor reviewed the PHQ-9 with Tanishi - scoring "22" which indicates severe depression.  She admits her mood is not stable currently and she has multiple stressors with her health and family.  Counselor provided a list of providers locally for Mitchelle to contact for treatment for her mental health.  Damari has goals to lose some weight and hopefully breathe better while in this program.  Counselor and staff will follow with Janiece throughout the course of this program.      Expected Outcomes  Shantasia will benefit from consistent exercise to achieve her stated goals.  The educational and psychoeducational components will be helpful in understanding her condition and coping more positively.  Genieve will meet with the dietician for addressing her weight loss goals.  She was given a list of providers to contact for mental health evaluations and treatment and agreed to put this in place.      Continue Psychosocial Services   Follow up required by counselor       Psychosocial Re-Evaluation: Psychosocial Re-Evaluation    [place] Name 09/13/17 1112 10/08/17 1113 10/11/17 1031 11/01/17 1041 11/26/17 1134     Psychosocial Re-Evaluation   Current issues with  Current Depression;Current Stress Concerns  -  Current Depression;History of Depression;Current Sleep Concerns;Current Stress Concerns  Current Depression;History of Depression;Current Sleep Concerns;Current Stress Concerns  Current Depression;History of Depression;Current Sleep Concerns;Current Stress Concerns   Comments  Lisa [name] stated that she has not followed up with any of the mental health care providers given to her by program counselor. She is still sleeping  about only 4 hours a night.   Lisa [name] returned after being absent most of the month. She was in good spirits and reported she was happy to be back. She said her time off was needed, but that she plans on coming next week as well.   Counselor follow up with Mollie today who has been out for awhile - reporting she had Pneumonia.  She continues to report sleep problems and states her mood is "about the same."  When asked if she had contacted any of the providers on the list this counselor gave to her in February, she stated she had not.  When asked if Sabre would be willing to see a Dr. about medications for her mood; as she reported a history of Bi-polar Disorder, Lisa [name] refused - stating - she "takes anough pills already."  She appears to isolate in class and wears her earbuds to listen to music rather than socializing with others.  Breda also appears irritated to speak to counselor or other staff in this program -more than is typical; which makes it difficult to follow with her on goal setting or progress made.  When asked how she is coping with ongoing stressors at this time; Lisa [name] responded - "I just  keep moving."  counselor attempted to educate Gertha on need to treat the whole person and how much her mood can impact the rest of her health - she appeared disinterested - and also reports she is not in favor or a "lung transplant" and will just keep doing what she is doing.  Counselor made staff aware of this and Yvaine's refusal for mental health treatment at this time.    Jamielyn states that everything we went over last time is still the same and there is nothing new to report.  Eletha was open about her struggle with her diagnosis that isn't well known. She is frustrated that most of her doctors admit they don't know much about her rare dx and always just keep referring to Lisa [name] advice. She feels like some her medicates are countering each other, but no one does anything about it. She was encouraged to write down  concerns and keep track of potential side effects to discuss with Lisa [name] in two months   Expected Outcomes  Short: Contact a mental health professional to establish care related to her current depression and stress. Long: Be able to sleep better each night and manage mental health wellbeing.   short: continue coming to [place]. Long: graduate from [employer].   Caidyn refuses mental health recommendations and/or treatment at this time - although she would benefit from it greatly.    Short: attend [facility] regularly to decrease stress. Long: Exercise after [facility] to decrease stress  Short: attend [facility] consistently to help with stress and coping. Long: continue to exercise after graduation and find ways to relax   Interventions  Encouraged to attend Pulmonary Rehabilitation for the exercise  -  -  Encouraged to attend [facility] Rehabilitation for the exercise  Encouraged to attend [facility] for the exercise;Relaxation education   Continue Psychosocial Services   Follow up required by counselor  -  -  Follow up required by staff  -      Psychosocial Discharge (Final Psychosocial Re-Evaluation): Psychosocial Re-Evaluation - 11/26/17 1134      Psychosocial Re-Evaluation   Current issues with  Current Depression;History of Depression;Current Sleep Concerns;Current Stress Concerns    Comments  Naysha was open about her struggle with her diagnosis that isn't well known. She is frustrated that most of her doctors admit they don't know much about her rare dx and always just keep referring to Lisa [name] advice. She feels like some her medicates are countering each other, but no one does anything about it. She was encouraged to write down concerns and keep track of potential side effects to discuss with Lisa [name] in two months    Expected Outcomes  Short: attend [facility] consistently to help with stress and coping. Long: continue to exercise after graduation and find ways to relax     Interventions  Encouraged to attend [facility] for the exercise;Relaxation education       Education: Education Goals: Education classes will be provided on a weekly basis, covering required topics. Participant will state understanding/return demonstration of topics presented.  Learning Barriers/Preferences: Learning Barriers/Preferences - 08/10/17 1254      Learning Barriers/Preferences   Learning Barriers  Reading In past year it takes longer to comprehend when reading.    Learning Preferences  Individual Instruction       Education Topics:  Initial Evaluation Education: - Verbal, written and demonstration of respiratory meds, oximetry and breathing techniques. Instruction on use of nebulizers and MDIs and importance of monitoring MDI activations.  Pulmonary Rehab from 12/01/2017 in [facility] Cardiac and Pulmonary Rehab  Date  08/10/17  Educator  SB  Instruction Review Code  1- Verbalizes Understanding      General Nutrition Guidelines/Fats and Fiber: -Group instruction provided by verbal, written material, models and posters to present the general guidelines for heart healthy nutrition. Gives an explanation and review of dietary fats and fiber.   Controlling Sodium/Reading Food Labels: -Group verbal and written material supporting the discussion of sodium use in heart healthy nutrition. Review and explanation with models, verbal and written materials for utilization of the food label.   Pulmonary Rehab from 12/01/2017 in [facility] Cardiac and Pulmonary Rehab  Date  10/11/17  Educator  CR  Instruction Review Code  1- Verbalizes Understanding      Exercise Physiology & General Exercise Guidelines: - Group verbal and written instruction with models to review the exercise physiology of the cardiovascular system and associated critical values. Provides general exercise guidelines with specific guidelines to those with heart or lung disease.    Aerobic Exercise &  Resistance Training: - Gives group verbal and written instruction on the various components of exercise. Focuses on aerobic and resistive training programs and the benefits of this training and how to safely progress through these programs.   Pulmonary Rehab from 12/01/2017 in [facility] Cardiac and Pulmonary Rehab  Date  11/10/17  Educator  [facility]  Instruction Review Code  1- Geologist, engineering, Balance, Mind/Body Relaxation: Provides group verbal/written instruction on the benefits of flexibility and balance training, including mind/body exercise modes such as yoga, pilates and tai chi.  Demonstration and skill practice provided.   Pulmonary Rehab from 12/01/2017 in [facility] Cardiac and Pulmonary Rehab  Date  08/25/17  Educator  AS  Instruction Review Code  1- Verbalizes Understanding      Stress and Anxiety: - Provides group verbal and written instruction about the health risks of elevated stress and causes of high stress.  Discuss the correlation between heart/lung disease and anxiety and treatment options. Review healthy ways to manage with stress and anxiety.   Depression: - Provides group verbal and written instruction on the correlation between heart/lung disease and depressed mood, treatment options, and the stigmas associated with seeking treatment.   Pulmonary Rehab from 12/01/2017 in [facility] Cardiac and Pulmonary Rehab  Date  09/01/17  Educator  [facility]  Instruction Review Code  1- Verbalizes Understanding      Exercise & Equipment Safety: - Individual verbal instruction and demonstration of equipment use and safety with use of the equipment.   Pulmonary Rehab from 12/01/2017 in [facility] Cardiac and Pulmonary Rehab  Date  08/10/17  Educator  Sb  Instruction Review Code  1- Verbalizes Understanding      Infection Prevention: - Provides verbal and written material to individual with discussion of infection control including proper hand washing and proper equipment cleaning  during exercise session.   Pulmonary Rehab from 12/01/2017 in [facility] Cardiac and Pulmonary Rehab  Date  08/10/17  Educator  SB  Instruction Review Code  1- Verbalizes Understanding      Falls Prevention: - Provides verbal and written material to individual with discussion of falls prevention and safety.   Diabetes: - Individual verbal and written instruction to review signs/symptoms of diabetes, desired ranges of glucose level fasting, after meals and with exercise. Advice that pre and post exercise glucose checks will be done for 3 sessions at entry of program.   Chronic Lung Diseases: -  Group verbal and written instruction to review updates, respiratory medications, advancements in procedures and treatments. Discuss use of supplemental oxygen including available portable oxygen systems, continuous and intermittent flow rates, concentrators, personal use and safety guidelines. Review proper use of inhaler and spacers. Provide informative websites for self-education.    Energy Conservation: - Provide group verbal and written instruction for methods to conserve energy, plan and organize activities. Instruct on pacing techniques, use of adaptive equipment and posture/positioning to relieve shortness of breath.   Pulmonary Rehab from 12/01/2017 in [facility] Cardiac and Pulmonary Rehab  Date  12/01/17  Educator  [facility]  Instruction Review Code  1- Verbalizes Understanding      Triggers and Exacerbations: - Group verbal and written instruction to review types of environmental triggers and ways to prevent exacerbations. Discuss weather changes, air quality and the benefits of nasal washing. Review warning signs and symptoms to help prevent infections. Discuss techniques for effective airway clearance, coughing, and vibrations.   AED/CPR: - Group verbal and written instruction with the use of models to demonstrate the basic use of the AED with the basic ABC's of resuscitation.   Pulmonary Rehab from  12/01/2017 in [facility] Cardiac and Pulmonary Rehab  Date  09/10/17  Educator  [facility]  Instruction Review Code  5- Refused Teaching      Anatomy and Physiology of the Lungs: - Group verbal and written instruction with the use of models to provide basic lung anatomy and physiology related to function, structure and complications of lung disease.   Anatomy & Physiology of the Heart: - Group verbal and written instruction and models provide basic cardiac anatomy and physiology, with the coronary electrical and arterial systems. Review of Valvular disease and Heart Failure   Cardiac Medications: - Group verbal and written instruction to review commonly prescribed medications for heart disease. Reviews the medication, class of the drug, and side effects.   Pulmonary Rehab from 12/01/2017 in [facility] Cardiac and Pulmonary Rehab  Date  11/19/17  Educator  [facility]  Instruction Review Code  5- Refused Teaching [Had to leave early]      Know Your Numbers and Risk Factors: -Group verbal and written instruction about important numbers in your health.  Discussion of what are risk factors and how they play a role in the disease process.  Review of Cholesterol, Blood Pressure, Diabetes, and BMI and the role they play in your overall health.   Sleep Hygiene: -Provides group verbal and written instruction about how sleep can affect your health.  Define sleep hygiene, discuss sleep cycles and impact of sleep habits. Review good sleep hygiene tips.    Other: -Provides group and verbal instruction on various topics (see comments)    Knowledge Questionnaire Score: Knowledge Questionnaire Score - 08/10/17 1255      Knowledge Questionnaire Score   Pre Score  15/18 Reviewed correct responses with Florina and she verbalized understanding of the responses.  She had no further questions today.        Core Components/Risk Factors/Patient Goals at Admission: Personal Goals and Risk Factors at Admission - 08/10/17 1248       Core Components/Risk Factors/Patient Goals on Admission    Weight Management  Yes;Obesity    Intervention  Weight Management: Develop a combined nutrition and exercise program designed to reach desired caloric intake, while maintaining appropriate intake of nutrient and fiber, sodium and fats, and appropriate energy expenditure required for the weight goal.;Weight Management/Obesity: Establish reasonable short term and long term weight goals.;Obesity: Provide  education and appropriate resources to help participant work on and attain dietary goals.    Admit Weight  268 lb 12.8 oz (121.9 kg)    Goal Weight: Short Term  265 lb (120.2 kg)    Goal Weight: Long Term  180 lb (81.6 kg)    Expected Outcomes  Short Term: Continue to assess and modify interventions until short term weight is achieved;Long Term: Adherence to nutrition and physical activity/exercise program aimed toward attainment of established weight goal;Weight Loss: Understanding of general recommendations for a balanced deficit meal plan, which promotes 1-2 lb weight loss per week and includes a negative energy balance of [phone] kcal/d    Improve shortness of breath with ADL's  Yes    Intervention  Provide education, individualized exercise plan and daily activity instruction to help decrease symptoms of SOB with activities of daily living.    Expected Outcomes  Short Term: Improve cardiorespiratory fitness to achieve a reduction of symptoms when performing ADLs;Long Term: Be able to perform more ADLs without symptoms or delay the onset of symptoms    Hypertension  Yes    Intervention  Provide education on lifestyle modifcations including regular physical activity/exercise, weight management, moderate sodium restriction and increased consumption of fresh fruit, vegetables, and low fat dairy, alcohol moderation, and smoking cessation.;Monitor prescription use compliance.    Expected Outcomes  Short Term: Continued assessment and  intervention until BP is < 140/71m HG in hypertensive participants. < 130/843mHG in hypertensive participants with diabetes, heart failure or chronic kidney disease.;Long Term: Maintenance of blood pressure at goal levels.    Stress  Yes    Intervention  Offer individual and/or small group education and counseling on adjustment to heart disease, stress management and health-related lifestyle change. Teach and support self-help strategies.;Refer participants experiencing significant psychosocial distress to appropriate mental health specialists for further evaluation and treatment. When possible, include family members and significant others in education/counseling sessions.    Expected Outcomes  Short Term: Participant demonstrates changes in health-related behavior, relaxation and other stress management skills, ability to obtain effective social support, and compliance with psychotropic medications if prescribed.;Long Term: Emotional wellbeing is indicated by absence of clinically significant psychosocial distress or social isolation.       Core Components/Risk Factors/Patient Goals Review:  Goals and Risk Factor Review    Row Name 09/13/17 1045 10/11/17 1047 11/01/17 1049 11/26/17 1125       Core Components/Risk Factors/Patient Goals Review   Personal Goals Review  Weight Management/Obesity;Hypertension;Stress;Improve shortness of breath with ADL's  Weight Management/Obesity;Hypertension;Improve shortness of breath with ADL's;Stress  Weight Management/Obesity;Hypertension;Improve shortness of breath with ADL's  Weight Management/Obesity;Hypertension;Improve shortness of breath with ADL's    Review  Lisa [name] that she has noticed some improvements in her SOB during daily activities. Her blood pressure as also reported to be under control with diet and exercise/no meds. She stated stress levels are about the same and her weight has also been mantained.   Lisa [name] taking all meds and BP is  reported to be under control. Her weight has been maintained but she still has a goal to lose weight. She has been out due to sickness but has retured and started to ease back into her exercise routine.   Lisa [name] gained some weight since she has been out of [facility]. She has not changed her eating habits and eats when she feels hungry. Her blood pressure has been within normal Limits. Her shortness of breath with her ADLS have not  changed since her attendance has been sporadic.  Lisa [name] is still not happy about her weight, but is trying to change her eating habits. She also has been on steroids on and off for years which doesn't help. Her blood pressure has been within normal limits in [facility]. She has noticed a difference in her SOB, as she has been trying to come more regularly to [facility] and being more attentive to her breathing at home.     Expected Outcomes  Short: Continue attending pulmonary rehab regularly and continue to take all meds, short term goal weight 260lbs  Long: Independently continue exercise routine. Long term goal weight 220 lbs.    Short: Attending pulmonary rehab regularly after being out due to sickness and ease back into previous workloads, and continue to take all meds, short term goal weight 260lbs  Long: Independently continue exercise routine. Long term goal weight 220 lbs.    Short: attend [facility] regularly. Long: maintain attending [facility] to improve overall health.  Short: attend [facility] consistently. Long: graduate [facility]       Core Components/Risk Factors/Patient Goals at Discharge (Final Review):  Goals and Risk Factor Review - 11/26/17 1125      Core Components/Risk Factors/Patient Goals Review   Personal Goals Review  Weight Management/Obesity;Hypertension;Improve shortness of breath with ADL's    Review  Revonda is still not happy about her weight, but is trying to change her eating habits. She also has been on steroids on and off for years which doesn't  help. Her blood pressure has been within normal limits in [facility]. She has noticed a difference in her SOB, as she has been trying to come more regularly to [facility] and being more attentive to her breathing at home.     Expected Outcomes  Short: attend [facility] consistently. Long: graduate [facility]       ITP Comments: ITP Comments    Row Name 08/10/17 1235 08/10/17 1301 08/23/17 0921 09/20/17 0828 09/27/17 1635   ITP Comments  Medical review completed today   ITP sent to Dr Loleta Chance for review, changes as needed and signature.  Documentation of diagnosis can be found in [facility] encounter1/14/2019  Shalissa is working on weight loss to be eligible to talk to the lung transplant team.   30 day review completed. ITP sent to Dr. Emily Filbert Director of [place]. Continue with ITP unless changes are made by physician.  30 day review completed. ITP sent to Dr. Emily Filbert Director of [place]. Continue with ITP unless changes are made by physician.  Left message for Lisa [name] to call back. She has not attended class since 09/17/17.   Row Name 10/06/17 1458 10/18/17 0837 11/15/17 0830 12/13/17 0827 12/15/17 1226   ITP Comments  Teagen has no attended since 09/17/17.  I left a VM about returning to class.   30 day review completed. ITP sent to Dr. Emily Filbert Director of [place]. Continue with ITP unless changes are made by physician   30 day review completed. ITP sent to Dr. Emily Filbert Director of [place]. Continue with ITP unless changes are made by physician   30 day review completed. ITP sent to Dr. Emily Filbert Director of [place]. Continue with ITP unless changes are made by physician  Pt has not attended since last exercise update    [place] Name 01/10/18 0952 01/20/18 1100         ITP Comments  Patient has not attended [facility] regularly. All goals not obtained. 30 day review completed. ITP  sent to Dr. Emily Filbert Director of [place]. Continue with ITP unless changes are made by physician  Discharge ITP  sent and signed by Dr. Sabra Heck.  Discharge Summary routed to PCP and pulmonologist.          Comments: Discharge ITP

## 2018-01-20 NOTE — Progress Notes (Signed)
Discharge Progress Report  Patient Details  Name: Lisa [name] MRN: [id number] Date of Birth: 1971-04-23 Referring Provider:     Pulmonary Rehab from 08/10/2017 in [facility]  Referring Provider  Raul Del       Number of Visits: 26/36  Reason for Discharge:  Early Exit:  Lack of attendance  Smoking History:  Social History   Tobacco Use  Smoking Status Never Smoker  Smokeless Tobacco Never Used    Diagnosis:  Uncomplicated asthma, unspecified asthma severity, unspecified whether persistent  ADL UCSD: Pulmonary Assessment Scores    Row Name 08/10/17 1257         ADL UCSD   ADL Phase  Entry     SOB Score total  98     Rest  2     Walk  4     Stairs  5     Bath  4     Dress  3     Shop  4       CAT Score   CAT Score  39       mMRC Score   mMRC Score  2        Initial Exercise Prescription: Initial Exercise Prescription - 08/10/17 1300      Date of Initial Exercise RX and Referring Provider   Date  08/10/17    Referring Provider  Raul Del      Treadmill   MPH  2.3    Grade  0.5    Minutes  15    METs  2.92      NuStep   Level  2    SPM  80    Minutes  15    METs  3      REL-XR   Level  2    Speed  50    Minutes  15    METs  3      Biostep-RELP   Level  2    SPM  50    Minutes  15    METs  3      Prescription Details   Frequency (times per week)  3    Duration  Progress to 45 minutes of aerobic exercise without signs/symptoms of physical distress      Intensity   THRR 40-80% of Max Heartrate  116-156    Ratings of Perceived Exertion  11-13    Perceived Dyspnea  0-4      Resistance Training   Training Prescription  Yes    Weight  3 lb    Reps  10-15       Discharge Exercise Prescription (Final Exercise Prescription Changes): Exercise Prescription Changes - 12/29/17 1500      Response to Exercise   Blood Pressure (Admit)  142/84    Blood Pressure (Exit)  138/80    Heart Rate (Admit)  83 bpm     Heart Rate (Exercise)  107 bpm    Heart Rate (Exit)  70 bpm    Oxygen Saturation (Admit)  99 %    Oxygen Saturation (Exercise)  97 %    Oxygen Saturation (Exit)  99 %    Rating of Perceived Exertion (Exercise)  13    Perceived Dyspnea (Exercise)  2    Symptoms  none    Duration  Continue with 45 min of aerobic exercise without signs/symptoms of physical distress.    Intensity  THRR unchanged      Progression   Progression  Continue to progress workloads to maintain intensity without signs/symptoms of physical distress.    Average METs  4      Resistance Training   Training Prescription  Yes    Weight  3 lb    Reps  10-15      Interval Training   Interval Training  No      Treadmill   MPH  2.3    Grade  0.5    Minutes  15    METs  2.92      NuStep   Level  4    SPM  108    Minutes  15    METs  2.9      REL-XR   Level  5    Speed  65    Minutes  15    METs  4.9      Home Exercise Plan   Plans to continue exercise at  [employer] (comment) Gym    Frequency  Add 1 additional day to program exercise sessions.       Functional Capacity: 6 Minute Walk    Row Name 08/10/17 1318         6 Minute Walk   Phase  Initial     Distance  1250 feet     Walk Time  6 minutes     # of Rest Breaks  0     MPH  2.37     METS  4.13     RPE  19     Perceived Dyspnea   4     VO2 Peak  14.47     Symptoms  Yes (comment)     Comments  back pain 7/10     Resting HR  77 bpm     Resting BP  126/76     Resting Oxygen Saturation   98 %     Exercise Oxygen Saturation  during 6 min walk  91 %     Max Ex. HR  138 bpm     Max Ex. BP  162/86     2 Minute Post BP  138/84       Interval HR   1 Minute HR  112     2 Minute HR  133     3 Minute HR  136     4 Minute HR  134     5 Minute HR  138     6 Minute HR  115     2 Minute Post HR  91     Interval Heart Rate?  Yes       Interval Oxygen   Interval Oxygen?  Yes     Baseline Oxygen Saturation %  98 %     1 Minute  Liters of Oxygen  0 L     2 Minute Oxygen Saturation %  91 %     2 Minute Liters of Oxygen  0 L     3 Minute Oxygen Saturation %  94 %     3 Minute Liters of Oxygen  0 L     4 Minute Oxygen Saturation %  95 %     4 Minute Liters of Oxygen  0 L     5 Minute Oxygen Saturation %  94 %     5 Minute Liters of Oxygen  0 L     6 Minute Oxygen Saturation %  95 %     6 Minute Liters of Oxygen  0 L     2 Minute Post Oxygen Saturation %  100 %     2 Minute Post Liters of Oxygen  0 L        Psychological, QOL, Others - Outcomes: PHQ 2/9: Depression screen PHQ 2/9 08/10/2017  Decreased Interest 3  Down, Depressed, Hopeless 2  PHQ - 2 Score 5  Altered sleeping 3  Tired, decreased energy 3  Change in appetite 3  Feeling bad or failure about yourself  2  Trouble concentrating 3  Moving slowly or fidgety/restless 3  Suicidal thoughts 0  PHQ-9 Score 22  Difficult doing work/chores Extremely dIfficult    Quality of Life:   Personal Goals: Goals established at orientation with interventions provided to work toward goal. Personal Goals and Risk Factors at Admission - 08/10/17 1248      Core Components/Risk Factors/Patient Goals on Admission    Weight Management  Yes;Obesity    Intervention  Weight Management: Develop a combined nutrition and exercise program designed to reach desired caloric intake, while maintaining appropriate intake of nutrient and fiber, sodium and fats, and appropriate energy expenditure required for the weight goal.;Weight Management/Obesity: Establish reasonable short term and long term weight goals.;Obesity: Provide education and appropriate resources to help participant work on and attain dietary goals.    Admit Weight  268 lb 12.8 oz (121.9 kg)    Goal Weight: Short Term  265 lb (120.2 kg)    Goal Weight: Long Term  180 lb (81.6 kg)    Expected Outcomes  Short Term: Continue to assess and modify interventions until short term weight is achieved;Long Term: Adherence  to nutrition and physical activity/exercise program aimed toward attainment of established weight goal;Weight Loss: Understanding of general recommendations for a balanced deficit meal plan, which promotes 1-2 lb weight loss per week and includes a negative energy balance of [phone]/d    Improve shortness of breath with ADL's  Yes    Intervention  Provide education, individualized exercise plan and daily activity instruction to help decrease symptoms of SOB with activities of daily living.    Expected Outcomes  Short Term: Improve cardiorespiratory fitness to achieve a reduction of symptoms when performing ADLs;Long Term: Be able to perform more ADLs without symptoms or delay the onset of symptoms    Hypertension  Yes    Intervention  Provide education on lifestyle modifcations including regular physical activity/exercise, weight management, moderate sodium restriction and increased consumption of fresh fruit, vegetables, and low fat dairy, alcohol moderation, and smoking cessation.;Monitor prescription use compliance.    Expected Outcomes  Short Term: Continued assessment and intervention until BP is < 140/80mm HG in hypertensive participants. < 130/48mm HG in hypertensive participants with diabetes, heart failure or chronic kidney disease.;Long Term: Maintenance of blood pressure at goal levels.    Stress  Yes    Intervention  Offer individual and/or small group education and counseling on adjustment to heart disease, stress management and health-related lifestyle change. Teach and support self-help strategies.;Refer participants experiencing significant psychosocial distress to appropriate mental health specialists for further evaluation and treatment. When possible, include family members and significant others in education/counseling sessions.    Expected Outcomes  Short Term: Participant demonstrates changes in health-related behavior, relaxation and other stress management skills, ability to  obtain effective social support, and compliance with psychotropic medications if prescribed.;Long Term: Emotional wellbeing is indicated by absence of clinically significant psychosocial distress or social isolation.        Personal Goals Discharge:  Goals and Risk Factor Review    Row Name 09/13/17 1045 10/11/17 1047 11/01/17 1049 11/26/17 1125       Core Components/Risk Factors/Patient Goals Review   Personal Goals Review  Weight Management/Obesity;Hypertension;Stress;Improve shortness of breath with ADL's  Weight Management/Obesity;Hypertension;Improve shortness of breath with ADL's;Stress  Weight Management/Obesity;Hypertension;Improve shortness of breath with ADL's  Weight Management/Obesity;Hypertension;Improve shortness of breath with ADL's    Review  Lisa [name] stated that she has noticed some improvements in her SOB during daily activities. Her blood pressure as also reported to be under control with diet and exercise/no meds. She stated stress levels are about the same and her weight has also been mantained.   Lisa [name] reports taking all meds and BP is reported to be under control. Her weight has been maintained but she still has a goal to lose weight. She has been out due to sickness but has retured and started to ease back into her exercise routine.   Lisa [name] has gained some weight since she has been out of [facility]. She has not changed her eating habits and eats when she feels hungry. Her blood pressure has been within normal Limits. Her shortness of breath with her ADLS have not changed since her attendance has been sporadic.  Lisa [name] is still not happy about her weight, but is trying to change her eating habits. She also has been on steroids on and off for years which doesn't help. Her blood pressure has been within normal limits in [facility]. She has noticed a difference in her SOB, as she has been trying to come more regularly to [facility] and being more attentive to her breathing at home.      Expected Outcomes  Short: Continue attending pulmonary rehab regularly and continue to take all meds, short term goal weight 260lbs  Long: Independently continue exercise routine. Long term goal weight 220 lbs.    Short: Attending pulmonary rehab regularly after being out due to sickness and ease back into previous workloads, and continue to take all meds, short term goal weight 260lbs  Long: Independently continue exercise routine. Long term goal weight 220 lbs.    Short: attend [facility] regularly. Long: maintain attending [facility] to improve overall health.  Short: attend [facility] consistently. Long: graduate [facility]       Exercise Goals and Review: Exercise Goals    Row Name 08/10/17 1314             Exercise Goals   Increase Physical Activity  Yes       Intervention  Provide advice, education, support and counseling about physical activity/exercise needs.;Develop an individualized exercise prescription for aerobic and resistive training based on initial evaluation findings, risk stratification, comorbidities and participant's personal goals.       Expected Outcomes  Short Term: Attend rehab on a regular basis to increase amount of physical activity.;Long Term: Exercising regularly at least 3-5 days a week.       Increase Strength and Stamina  Yes       Intervention  Provide advice, education, support and counseling about physical activity/exercise needs.;Develop an individualized exercise prescription for aerobic and resistive training based on initial evaluation findings, risk stratification, comorbidities and participant's personal goals.       Expected Outcomes  Short Term: Increase workloads from initial exercise prescription for resistance, speed, and METs.;Long Term: Improve cardiorespiratory fitness, muscular endurance and strength as measured by increased METs and functional capacity (6MWT);Short Term: Perform resistance training exercises routinely during rehab and add in  resistance  training at home       Able to understand and use rate of perceived exertion (RPE) scale  Yes       Intervention  Provide education and explanation on how to use RPE scale       Expected Outcomes  Short Term: Able to use RPE daily in rehab to express subjective intensity level;Long Term:  Able to use RPE to guide intensity level when exercising independently       Able to understand and use Dyspnea scale  Yes       Intervention  Provide education and explanation on how to use Dyspnea scale       Expected Outcomes  Short Term: Able to use Dyspnea scale daily in rehab to express subjective sense of shortness of breath during exertion;Long Term: Able to use Dyspnea scale to guide intensity level when exercising independently       Knowledge and understanding of Target Heart Rate Range (THRR)  Yes       Intervention  Provide education and explanation of THRR including how the numbers were predicted and where they are located for reference       Expected Outcomes  Short Term: Able to state/look up THRR;Short Term: Able to use daily as guideline for intensity in rehab;Long Term: Able to use THRR to govern intensity when exercising independently       Able to check pulse independently  Yes       Intervention  Provide education and demonstration on how to check pulse in carotid and radial arteries.;Review the importance of being able to check your own pulse for safety during independent exercise       Expected Outcomes  Short Term: Able to explain why pulse checking is important during independent exercise;Long Term: Able to check pulse independently and accurately       Understanding of Exercise Prescription  Yes       Intervention  Provide education, explanation, and written materials on patient's individual exercise prescription       Expected Outcomes  Short Term: Able to explain program exercise prescription;Long Term: Able to explain home exercise prescription to exercise independently           Nutrition & Weight - Outcomes: Pre Biometrics - 08/10/17 1313      Pre Biometrics   Height  5\' 8"  (1.727 m)    Weight  268 lb 12.8 oz (121.9 kg)    Waist Circumference  42 inches    Hip Circumference  44.5 inches    Waist to Hip Ratio  0.94 %    BMI (Calculated)  40.88        Nutrition: Nutrition Therapy & Goals - 09/13/17 1059      Intervention Plan   Expected Outcomes  Short Term Goal: Understand basic principles of dietary content, such as calories, fat, sodium, cholesterol and nutrients.;Short Term Goal: A plan has been developed with personal nutrition goals set during dietitian appointment.;Long Term Goal: Adherence to prescribed nutrition plan.       Nutrition Discharge: Nutrition Assessments - 08/10/17 1247      MEDFICTS Scores   Pre Score  46       Education Questionnaire Score: Knowledge Questionnaire Score - 08/10/17 1255      Knowledge Questionnaire Score   Pre Score  15/18 Reviewed correct responses with Lisa [name] and she verbalized understanding of the responses.  She had no further questions today.       Goals reviewed with patient;  copy given to patient.

## 2018-09-06 LAB — HM HIV SCREENING LAB: HM HIV Screening: NEGATIVE

## 2019-02-11 DIAGNOSIS — Z9289 Personal history of other medical treatment: Secondary | ICD-10-CM

## 2019-02-11 HISTORY — DX: Personal history of other medical treatment: Z92.89

## 2019-03-02 ENCOUNTER — Other Ambulatory Visit: Payer: Self-pay | Admitting: Specialist

## 2019-03-02 ENCOUNTER — Emergency Department: Payer: Medicare Other

## 2019-03-02 ENCOUNTER — Other Ambulatory Visit: Payer: Self-pay

## 2019-03-02 DIAGNOSIS — N920 Excessive and frequent menstruation with regular cycle: Secondary | ICD-10-CM | POA: Diagnosis present

## 2019-03-02 DIAGNOSIS — Z86718 Personal history of other venous thrombosis and embolism: Secondary | ICD-10-CM

## 2019-03-02 DIAGNOSIS — Z20828 Contact with and (suspected) exposure to other viral communicable diseases: Secondary | ICD-10-CM | POA: Diagnosis present

## 2019-03-02 DIAGNOSIS — Z888 Allergy status to other drugs, medicaments and biological substances status: Secondary | ICD-10-CM

## 2019-03-02 DIAGNOSIS — K219 Gastro-esophageal reflux disease without esophagitis: Secondary | ICD-10-CM | POA: Diagnosis present

## 2019-03-02 DIAGNOSIS — J849 Interstitial pulmonary disease, unspecified: Secondary | ICD-10-CM

## 2019-03-02 DIAGNOSIS — F4310 Post-traumatic stress disorder, unspecified: Secondary | ICD-10-CM | POA: Diagnosis present

## 2019-03-02 DIAGNOSIS — G43909 Migraine, unspecified, not intractable, without status migrainosus: Secondary | ICD-10-CM | POA: Diagnosis present

## 2019-03-02 DIAGNOSIS — Z6841 Body Mass Index (BMI) 40.0 and over, adult: Secondary | ICD-10-CM

## 2019-03-02 DIAGNOSIS — Z801 Family history of malignant neoplasm of trachea, bronchus and lung: Secondary | ICD-10-CM

## 2019-03-02 DIAGNOSIS — I82441 Acute embolism and thrombosis of right tibial vein: Secondary | ICD-10-CM | POA: Diagnosis not present

## 2019-03-02 DIAGNOSIS — F319 Bipolar disorder, unspecified: Secondary | ICD-10-CM | POA: Diagnosis present

## 2019-03-02 DIAGNOSIS — J45909 Unspecified asthma, uncomplicated: Secondary | ICD-10-CM | POA: Diagnosis present

## 2019-03-02 DIAGNOSIS — Z800 Family history of malignant neoplasm of digestive organs: Secondary | ICD-10-CM

## 2019-03-02 DIAGNOSIS — Z86711 Personal history of pulmonary embolism: Secondary | ICD-10-CM

## 2019-03-02 DIAGNOSIS — R0602 Shortness of breath: Secondary | ICD-10-CM | POA: Diagnosis not present

## 2019-03-02 DIAGNOSIS — J8489 Other specified interstitial pulmonary diseases: Secondary | ICD-10-CM | POA: Diagnosis present

## 2019-03-02 DIAGNOSIS — D509 Iron deficiency anemia, unspecified: Secondary | ICD-10-CM | POA: Diagnosis present

## 2019-03-02 DIAGNOSIS — G4733 Obstructive sleep apnea (adult) (pediatric): Secondary | ICD-10-CM | POA: Diagnosis present

## 2019-03-02 DIAGNOSIS — Z833 Family history of diabetes mellitus: Secondary | ICD-10-CM

## 2019-03-02 LAB — COMPREHENSIVE METABOLIC PANEL
ALT: 12 U/L (ref 0–44)
AST: 15 U/L (ref 15–41)
Albumin: 3.9 g/dL (ref 3.5–5.0)
Alkaline Phosphatase: 42 U/L (ref 38–126)
Anion gap: 5 (ref 5–15)
BUN: 8 mg/dL (ref 6–20)
CO2: 24 mmol/L (ref 22–32)
Calcium: 8.9 mg/dL (ref 8.9–10.3)
Chloride: 107 mmol/L (ref 98–111)
Creatinine, Ser: 0.71 mg/dL (ref 0.44–1.00)
GFR calc Af Amer: >60 mL/min (ref >60)
GFR calc non Af Amer: >60 mL/min (ref >60)
Glucose, Bld: 102 mg/dL — ABNORMAL HIGH (ref 70–99)
Potassium: 3.8 mmol/L (ref 3.5–5.1)
Sodium: 136 mmol/L (ref 135–145)
Total Bilirubin: 0.4 mg/dL (ref 0.3–1.2)
Total Protein: 7.7 g/dL (ref 6.5–8.1)

## 2019-03-02 LAB — CBC
HCT: 23.9 % — ABNORMAL LOW (ref 36.0–46.0)
Hemoglobin: 6.7 g/dL — ABNORMAL LOW (ref 12.0–15.0)
MCH: 17.9 pg — ABNORMAL LOW (ref 26.0–34.0)
MCHC: 28 g/dL — ABNORMAL LOW (ref 30.0–36.0)
MCV: 63.7 fL — ABNORMAL LOW (ref 80.0–100.0)
Platelets: 402 10*3/uL — ABNORMAL HIGH (ref 150–400)
RBC: 3.75 MIL/uL — ABNORMAL LOW (ref 3.87–5.11)
RDW: 21.9 % — ABNORMAL HIGH (ref 11.5–15.5)
WBC: 13.6 10*3/uL — ABNORMAL HIGH (ref 4.0–10.5)
nRBC: 0 % (ref 0.0–0.2)

## 2019-03-02 NOTE — ED Triage Notes (Signed)
Patient c/o right lower leg pain with multiple knots in legs. 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 Patient c/o SOB.

## 2019-03-03 ENCOUNTER — Telehealth: Payer: Self-pay | Admitting: Obstetrics and Gynecology

## 2019-03-03 ENCOUNTER — Inpatient Hospital Stay
Admission: EM | Admit: 2019-03-03 | Discharge: 2019-03-05 | DRG: 300 | Disposition: A | Discharge status: Home / Self Care | Payer: Medicare Other | Attending: Internal Medicine | Admitting: Internal Medicine

## 2019-03-03 ENCOUNTER — Emergency Department: Payer: Medicare Other

## 2019-03-03 ENCOUNTER — Other Ambulatory Visit: Payer: Self-pay | Admitting: Obstetrics and Gynecology

## 2019-03-03 DIAGNOSIS — I82441 Acute embolism and thrombosis of right tibial vein: Secondary | ICD-10-CM

## 2019-03-03 DIAGNOSIS — D649 Anemia, unspecified: Secondary | ICD-10-CM | POA: Diagnosis present

## 2019-03-03 DIAGNOSIS — I82591 Chronic embolism and thrombosis of other specified deep vein of right lower extremity: Secondary | ICD-10-CM

## 2019-03-03 DIAGNOSIS — R0602 Shortness of breath: Secondary | ICD-10-CM

## 2019-03-03 DIAGNOSIS — N939 Abnormal uterine and vaginal bleeding, unspecified: Secondary | ICD-10-CM

## 2019-03-03 DIAGNOSIS — N920 Excessive and frequent menstruation with regular cycle: Secondary | ICD-10-CM | POA: Diagnosis not present

## 2019-03-03 DIAGNOSIS — D500 Iron deficiency anemia secondary to blood loss (chronic): Secondary | ICD-10-CM | POA: Diagnosis not present

## 2019-03-03 LAB — FERRITIN: Ferritin: 5 ng/mL — ABNORMAL LOW (ref 11–307)

## 2019-03-03 LAB — FOLATE: Folate: 16.7 ng/mL (ref >5.9)

## 2019-03-03 LAB — BASIC METABOLIC PANEL
Anion gap: 9 (ref 5–15)
BUN: 9 mg/dL (ref 6–20)
CO2: 23 mmol/L (ref 22–32)
Calcium: 9.3 mg/dL (ref 8.9–10.3)
Chloride: 107 mmol/L (ref 98–111)
Creatinine, Ser: 0.73 mg/dL (ref 0.44–1.00)
GFR calc Af Amer: >60 mL/min (ref >60)
GFR calc non Af Amer: >60 mL/min (ref >60)
Glucose, Bld: 107 mg/dL — ABNORMAL HIGH (ref 70–99)
Potassium: 4 mmol/L (ref 3.5–5.1)
Sodium: 139 mmol/L (ref 135–145)

## 2019-03-03 LAB — PREPARE RBC (CROSSMATCH)

## 2019-03-03 LAB — IRON AND TIBC
Iron: 13 ug/dL — ABNORMAL LOW (ref 28–170)
Saturation Ratios: 3 % — ABNORMAL LOW (ref 10.4–31.8)
TIBC: 501 ug/dL — ABNORMAL HIGH (ref 250–450)
UIBC: 488 ug/dL

## 2019-03-03 LAB — URINALYSIS, COMPLETE (UACMP) WITH MICROSCOPIC
Bacteria, UA: NONE SEEN
Bilirubin Urine: NEGATIVE
Glucose, UA: NEGATIVE mg/dL
Hgb urine dipstick: NEGATIVE
Ketones, ur: NEGATIVE mg/dL
Leukocytes,Ua: NEGATIVE
Nitrite: NEGATIVE
Protein, ur: NEGATIVE mg/dL
Specific Gravity, Urine: 1.006 (ref 1.005–1.030)
pH: 7 (ref 5.0–8.0)

## 2019-03-03 LAB — CBC
HCT: 26.7 % — ABNORMAL LOW (ref 36.0–46.0)
Hemoglobin: 7.8 g/dL — ABNORMAL LOW (ref 12.0–15.0)
MCH: 19.1 pg — ABNORMAL LOW (ref 26.0–34.0)
MCHC: 29.2 g/dL — ABNORMAL LOW (ref 30.0–36.0)
MCV: 65.4 fL — ABNORMAL LOW (ref 80.0–100.0)
Platelets: 379 10*3/uL (ref 150–400)
RBC: 4.08 MIL/uL (ref 3.87–5.11)
RDW: 23.3 % — ABNORMAL HIGH (ref 11.5–15.5)
WBC: 11.2 10*3/uL — ABNORMAL HIGH (ref 4.0–10.5)
nRBC: 0.2 % (ref 0.0–0.2)

## 2019-03-03 LAB — RETICULOCYTES
Immature Retic Fract: 36.8 % — ABNORMAL HIGH (ref 2.3–15.9)
RBC.: 3.79 MIL/uL — ABNORMAL LOW (ref 3.87–5.11)
Retic Count, Absolute: 42.8 10*3/uL (ref 19.0–186.0)
Retic Ct Pct: 1.1 % (ref 0.4–3.1)

## 2019-03-03 LAB — SARS CORONAVIRUS 2 (TAT 6-24 HRS): SARS Coronavirus 2: NEGATIVE

## 2019-03-03 LAB — TSH: TSH: 1.888 u[IU]/mL (ref 0.350–4.500)

## 2019-03-03 LAB — VITAMIN B12: Vitamin B-12: 198 pg/mL (ref 180–914)

## 2019-03-03 LAB — HEMOGLOBIN AND HEMATOCRIT, BLOOD
HCT: 23.9 % — ABNORMAL LOW (ref 36.0–46.0)
Hemoglobin: 6.6 g/dL — ABNORMAL LOW (ref 12.0–15.0)

## 2019-03-03 MED ORDER — ONDANSETRON HCL 4 MG/2ML IJ SOLN: 4.0000 mg | Freq: Four times a day (QID) | INTRAMUSCULAR | Status: DC | PRN

## 2019-03-03 MED ORDER — ONDANSETRON HCL 4 MG PO TABS: 4.0000 mg | ORAL_TABLET | Freq: Four times a day (QID) | ORAL | Status: DC | PRN

## 2019-03-03 MED ORDER — ENOXAPARIN SODIUM 150 MG/ML ~~LOC~~ SOLN
125.0000 mg | Freq: Two times a day (BID) | SUBCUTANEOUS | Status: DC
  Filled 2019-03-03: qty 0.83

## 2019-03-03 MED ORDER — ACETAMINOPHEN 650 MG RE SUPP: 650.0000 mg | Freq: Four times a day (QID) | RECTAL | Status: DC | PRN

## 2019-03-03 MED ORDER — ENOXAPARIN SODIUM 150 MG/ML ~~LOC~~ SOLN
1.0000 mg/kg | Freq: Once | SUBCUTANEOUS | Status: AC
  Administered 2019-03-03: 125 mg via SUBCUTANEOUS
  Filled 2019-03-03: qty 0.82

## 2019-03-03 MED ORDER — POLYETHYLENE GLYCOL 3350 17 G PO PACK
17.0000 g | PACK | Freq: Every day | ORAL | Status: DC | PRN
  Administered 2019-03-04: 17 g via ORAL
  Filled 2019-03-03: qty 1

## 2019-03-03 MED ORDER — APIXABAN 5 MG PO TABS: 5.0000 mg | ORAL_TABLET | Freq: Two times a day (BID) | ORAL | Status: DC

## 2019-03-03 MED ORDER — SODIUM CHLORIDE 0.9 % IV SOLN
10.0000 mL/h | Freq: Once | INTRAVENOUS | Status: AC
  Administered 2019-03-03: 02:00:00 10 mL/h via INTRAVENOUS

## 2019-03-03 MED ORDER — PREDNISONE 20 MG PO TABS
20.0000 mg | ORAL_TABLET | Freq: Every day | ORAL | Status: DC
  Administered 2019-03-03 – 2019-03-05 (×3): 20 mg via ORAL
  Filled 2019-03-03 (×3): qty 1

## 2019-03-03 MED ORDER — MEDROXYPROGESTERONE ACETATE 10 MG PO TABS
20.0000 mg | ORAL_TABLET | Freq: Every day | ORAL | Status: DC
  Administered 2019-03-03 – 2019-03-05 (×3): 20 mg via ORAL
  Filled 2019-03-03 (×3): qty 2

## 2019-03-03 MED ORDER — PANTOPRAZOLE SODIUM 40 MG PO TBEC
40.0000 mg | DELAYED_RELEASE_TABLET | Freq: Every day | ORAL | Status: DC
  Administered 2019-03-03 – 2019-03-05 (×3): 40 mg via ORAL
  Filled 2019-03-03 (×3): qty 1

## 2019-03-03 MED ORDER — DOCUSATE SODIUM 100 MG PO CAPS
100.0000 mg | ORAL_CAPSULE | Freq: Two times a day (BID) | ORAL | Status: DC
  Administered 2019-03-03 – 2019-03-05 (×4): 100 mg via ORAL
  Filled 2019-03-03 (×4): qty 1

## 2019-03-03 MED ORDER — PROPRANOLOL HCL 20 MG PO TABS
20.0000 mg | ORAL_TABLET | Freq: Two times a day (BID) | ORAL | Status: DC
  Administered 2019-03-03 – 2019-03-05 (×4): 20 mg via ORAL
  Filled 2019-03-03 (×5): qty 1

## 2019-03-03 MED ORDER — ACETAMINOPHEN 325 MG PO TABS: 650.0000 mg | ORAL_TABLET | Freq: Four times a day (QID) | ORAL | Status: DC | PRN

## 2019-03-03 MED ORDER — APIXABAN 5 MG PO TABS
10.0000 mg | ORAL_TABLET | Freq: Two times a day (BID) | ORAL | Status: DC
  Administered 2019-03-03 – 2019-03-05 (×4): 10 mg via ORAL
  Filled 2019-03-03 (×5): qty 2

## 2019-03-03 MED ORDER — AZATHIOPRINE 50 MG PO TABS
50.0000 mg | ORAL_TABLET | Freq: Every day | ORAL | Status: DC
  Administered 2019-03-03 – 2019-03-05 (×3): 50 mg via ORAL
  Filled 2019-03-03 (×3): qty 1

## 2019-03-03 MED ORDER — BISACODYL 10 MG RE SUPP: 10.0000 mg | Freq: Every day | RECTAL | Status: DC | PRN

## 2019-03-03 MED ORDER — OXYCODONE HCL 5 MG PO TABS
5.0000 mg | ORAL_TABLET | ORAL | Status: DC | PRN
  Administered 2019-03-03 – 2019-03-04 (×2): 5 mg via ORAL
  Filled 2019-03-03 (×2): qty 1

## 2019-03-03 MED ORDER — SODIUM CHLORIDE 0.9 % IV SOLN
200.0000 mg | INTRAVENOUS | Status: DC
  Administered 2019-03-03 – 2019-03-04 (×2): 200 mg via INTRAVENOUS
  Filled 2019-03-03 (×2): qty 10

## 2019-03-03 MED ORDER — IPRATROPIUM-ALBUTEROL 0.5-2.5 (3) MG/3ML IN SOLN
3.0000 mL | Freq: Four times a day (QID) | RESPIRATORY_TRACT | Status: DC
  Administered 2019-03-03 – 2019-03-04 (×5): 3 mL via RESPIRATORY_TRACT
  Filled 2019-03-03 (×6): qty 3

## 2019-03-03 NOTE — H&P (Signed)
Lisa [name] is an 48 y.o. female.   Chief Complaint: Fatigue HPI: The patient with past medical history of DVT and pulmonary embolism, anemia and sleep apnea presents to the emergency department complaining of fatigue.  The patient reports shortness of breath with minimal exertion.  She denies chest pain, nausea, vomiting or diaphoresis.  She reports generalized weakness but no loss of sensation or motor control.  Laboratory evaluation revealed severe anemia.  The patient admits to menorrhagia and history of anemia.  Red blood cell transfusion was ordered in the emergency department before the hospitalist service was called for admission.  Past Medical History:  Diagnosis Date  . Abdominal pain   . Anemia    hx of anemic  . Asthma   . Bipolar 1 disorder ([place])   . Blood in stool   . Change in voice   . Chest pain   . Constipation   . Depression   . Diverticulitis   . DVT (deep venous thrombosis) ([place]) 12/13/2014  . Dysrhythmia   . Fatigue   . Gallstones 04/10/2011   with biliary pancreatitis  . GERD (gastroesophageal reflux disease)   . Migraines   . MRSA carrier   . Nausea   . Pancreatitis   . PTSD (post-traumatic stress disorder)   . Pulmonary embolism ([place]) 12/13/2014  . Sleep apnea    cpap  . Trouble swallowing     Past Surgical History:  Procedure Laterality Date  . BRAVO [place] STUDY  06/02/2011   Procedure: BRAVO [place] STUDY;  Surgeon: Inda Castle, MD;  Location: [facility];  Service: Endoscopy;  Laterality: N/A;  . CHOLECYSTECTOMY    . DILATION AND CURETTAGE OF UTERUS    . ESOPHAGOGASTRODUODENOSCOPY  06/02/2011   Procedure: ESOPHAGOGASTRODUODENOSCOPY (EGD);  Surgeon: Inda Castle, MD;  Location: [facility];  Service: Endoscopy;  Laterality: N/A;  . LAPAROSCOPIC CHOLECYSTECTOMY W/ CHOLANGIOGRAPHY  04/25/2011   Dr Margot Chimes  . NASAL POLYP SURGERY     polyp from vocal cords  . WISDOM TOOTH EXTRACTION      Family History  Problem Relation Age of Onset  .  Diabetes Mother   . Pancreatic cancer Mother   . Lung cancer Father   . Cancer Paternal Aunt breast  . Anesthesia problems Neg Hx   . Hypotension Neg Hx   . Malignant hyperthermia Neg Hx   . Pseudochol deficiency Neg Hx    Social History:  reports that she has never smoked. She has never used smokeless tobacco. She reports current alcohol use. She reports that she does not use drugs.  Allergies:  Allergies  Allergen Reactions  . Lisinopril Anaphylaxis    Prior to Admission medications   Medication Sig Start Date End Date Taking? Authorizing Provider  AIMOVIG 140 MG/ML SOAJ Inject 140 mg into the skin every 30 (thirty) days. 02/19/19  Yes [provider]  albuterol (PROVENTIL HFA;VENTOLIN HFA) 108 (90 Base) MCG/ACT inhaler Inhale 2 puffs into the lungs every 6 (six) hours as needed for wheezing or shortness of breath. 02/28/17  Yes Alfred Levins, [place], MD  azaTHIOprine (IMURAN) 50 MG tablet Take 50 mg by mouth daily. 02/02/19  Yes [provider]  Chromium 500 MCG TABS Take 500 mcg by mouth daily.   Yes [provider]  Ipratropium-Albuterol (COMBIVENT) 20-100 MCG/ACT AERS respimat Inhale 1 puff into the lungs every 6 (six) hours.   Yes [provider]  omeprazole (PRILOSEC) 40 MG capsule Take 40 mg by mouth daily. 01/03/19  Yes  [provider]  predniSONE (DELTASONE) 10 MG tablet Take 20 mg by mouth daily.   Yes [provider]  Probiotic Product (ACIDOPHILUS/BIFIDUS PO) Take 1 capsule by mouth daily.   Yes [provider]  propranolol (INDERAL) 20 MG tablet Take 20 mg by mouth 2 (two) times daily. 01/03/19 01/03/20 Yes [provider]  SUMAtriptan (IMITREX) 100 MG tablet TAKE 1 TABLET BY MOUTH ONCE DAILY AS NEEDED FOR MIGRAINE FOR UP TO 1 DOSE MAY TAKE A SECOND DOSE AFTER 2 HOURS IF NEEDED 02/10/19  Yes [provider]     Results for orders placed or performed during the hospital encounter of 03/03/19 (from the  past 48 hour(s))  CBC     Status: Abnormal   Collection Time: 03/02/19 11:04 PM  Result Value Ref Range   WBC 13.6 (H) 4.0 - 10.5 K/uL   RBC 3.75 (L) 3.87 - 5.11 MIL/uL   Hemoglobin 6.7 (L) 12.0 - 15.0 g/dL    Comment: Reticulocyte Hemoglobin testing may be clinically indicated, consider ordering this additional test [id number]    HCT 23.9 (L) 36.0 - 46.0 %   MCV 63.7 (L) 80.0 - 100.0 fL   MCH 17.9 (L) 26.0 - 34.0 pg   MCHC 28.0 (L) 30.0 - 36.0 g/dL   RDW 21.9 (H) 11.5 - 15.5 %   Platelets 402 (H) 150 - 400 K/uL   nRBC 0.0 0.0 - 0.2 %    Comment: Performed at [facility], [address] [place], [place] [address]  Comprehensive metabolic panel     Status: Abnormal   Collection Time: 03/02/19 11:04 PM  Result Value Ref Range   Sodium 136 135 - 145 mmol/L   Potassium 3.8 3.5 - 5.1 mmol/L   Chloride 107 98 - 111 mmol/L   CO2 24 22 - 32 mmol/L   Glucose, Bld 102 (H) 70 - 99 mg/dL   BUN 8 6 - 20 mg/dL   Creatinine, Ser 0.71 0.44 - 1.00 mg/dL   Calcium 8.9 8.9 - 10.3 mg/dL   Total Protein 7.7 6.5 - 8.1 g/dL   Albumin 3.9 3.5 - 5.0 g/dL   AST 15 15 - 41 U/L   ALT 12 0 - 44 U/L   Alkaline Phosphatase 42 38 - 126 U/L   Total Bilirubin 0.4 0.3 - 1.2 mg/dL   GFR calc non Af Amer >60 >60 mL/min   GFR calc Af Amer >60 >60 mL/min   Anion gap 5 5 - 15    Comment: Performed at [facility], [address] [place], [place] [address]  TSH     Status: None   Collection Time: 03/02/19 11:04 PM  Result Value Ref Range   TSH 1.888 0.350 - 4.500 uIU/mL    Comment: Performed by a 3rd Generation assay with a functional sensitivity of <=0.01 uIU/mL. Performed at [facility], [address] [place], [place] [address]   Hemoglobin and hematocrit, blood     Status: Abnormal   Collection Time: 03/03/19  1:00 AM  Result Value Ref Range   Hemoglobin 6.6 (L) 12.0 - 15.0 g/dL   HCT 23.9 (L) 36.0 - 46.0 %    Comment: Performed at [facility], [address] [place], [place] [address]  Type and screen [place]     Status: None (Preliminary result)   Collection Time: 03/03/19  1:00 AM  Result Value Ref Range   ABO/RH(D) B POS    Antibody  Screen NEG    Sample Expiration 03/06/2019,2359    Unit Number [id number]    Blood Component Type RED CELLS,LR    Unit division 00    Status of Unit ISSUED    Transfusion Status OK TO TRANSFUSE    Crossmatch Result      Compatible Performed at [facility], [address] [place], [place] [address]   Prepare RBC     Status: None   Collection Time: 03/03/19  1:33 AM  Result Value Ref Range   Order Confirmation      ORDER PROCESSED BY BLOOD BANK Performed at [facility], [address] [place], [place] [address]    Dg Chest 1 View  Result Date: 03/03/2019 CLINICAL DATA:  Shortness of breath. Right leg pain. EXAM: CHEST  1 VIEW COMPARISON:  CT 05/19/2017, radiograph 02/27/2017 FINDINGS: Unchanged heart size and mediastinal contours. Multifocal regions of scarring and architectural distortion are not significantly changed from prior imaging. No acute airspace disease. No pleural fluid or pneumothorax. Unchanged osseous structures. IMPRESSION: Multifocal regions of scarring are not significantly changed from prior imaging consistent with interstitial lung disease. No superimposed acute abnormality. Electronically Signed   By: Keith Rake M.D.   On: 03/03/2019 01:05   US Venous Img Lower Unilateral Right  Result Date: 03/03/2019 CLINICAL DATA:  Right leg pain for 5 days. EXAM: RIGHT LOWER EXTREMITY VENOUS DOPPLER ULTRASOUND TECHNIQUE: Gray-scale sonography with graded compression, as well as color Doppler and duplex ultrasound were performed to evaluate the lower extremity deep venous systems from the level of the common femoral vein and including the common femoral, femoral, profunda femoral, popliteal and calf veins including the posterior tibial,  peroneal and gastrocnemius veins when visible. The superficial great saphenous vein was also interrogated. Spectral Doppler was utilized to evaluate flow at rest and with distal augmentation maneuvers in the common femoral, femoral and popliteal veins. COMPARISON:  None. FINDINGS: Contralateral Common Femoral Vein: Respiratory phasicity is normal and symmetric with the symptomatic side. No evidence of thrombus. Normal compressibility. Common Femoral Vein: No evidence of thrombus. Normal compressibility, respiratory phasicity and response to augmentation. Saphenofemoral Junction: No evidence of thrombus. Normal compressibility and flow on color Doppler imaging. Profunda Femoral Vein: No evidence of thrombus. Normal compressibility and flow on color Doppler imaging. Femoral Vein: No evidence of thrombus. Normal compressibility, respiratory phasicity and response to augmentation. Popliteal Vein: No evidence of thrombus. Normal compressibility, respiratory phasicity and response to augmentation. Calf Veins: Occlusive thrombus in the posterior tibial vein with lack of compressibility. No evidence of thrombus in the peroneal vein. Superficial Great Saphenous Vein: No evidence of thrombus. Normal compressibility. Venous Reflux:  None. Other Findings: Superficial thrombus in the right mid calf in the area of patient pain. IMPRESSION: 1. Occlusive thrombus in the posterior tibial vein. No thrombus proximal to the knee. 2. Superficial thrombophlebitis in the right mid calf. These results were called by telephone at the time of interpretation on 03/03/2019 at 12:55 am to Dr. Harvest Dark , who verbally acknowledged these results. Electronically Signed   By: Keith Rake M.D.   On: 03/03/2019 00:55    Review of Systems  Constitutional: Positive for malaise/fatigue. Negative for chills and fever.  HENT: Negative for sore throat and tinnitus.   Eyes: Negative for blurred vision and redness.  Respiratory: Positive  for shortness of breath. Negative for cough.   Cardiovascular: Negative for chest pain, palpitations, orthopnea and PND.  Gastrointestinal: Negative for abdominal pain, diarrhea, nausea and  vomiting.  Genitourinary: Negative for dysuria, frequency and urgency.  Musculoskeletal: Negative for joint pain and myalgias.  Skin: Negative for rash.       No lesions  Neurological: Positive for weakness. Negative for speech change and focal weakness.  Endo/Heme/Allergies: Does not bruise/bleed easily.       No temperature intolerance  Psychiatric/Behavioral: Negative for depression and suicidal ideas.    Blood pressure (!) 105/55, pulse 66, temperature 98.3 F (36.8 C), temperature source Oral, resp. rate 18, height 5\' 8"  (1.727 m), weight 122.5 kg, SpO2 97 %. Physical Exam  Vitals reviewed. Constitutional: She is oriented to person, place, and time. She appears well-developed and well-nourished. No distress.  HENT:  Head: Normocephalic and atraumatic.  Mouth/Throat: Oropharynx is clear and moist.  Eyes: Pupils are equal, round, and reactive to light. Conjunctivae and EOM are normal. No scleral icterus.  Neck: Normal range of motion. Neck supple. No JVD present. No tracheal deviation present. No thyromegaly present.  Cardiovascular: Normal rate, regular rhythm and normal heart sounds. Exam reveals no gallop and no friction rub.  No murmur heard. Respiratory: Effort normal and breath sounds normal.  GI: Soft. Bowel sounds are normal. She exhibits no distension. There is no abdominal tenderness.  Genitourinary:    Genitourinary Comments: Deferred   Musculoskeletal: Normal range of motion.        General: No edema.  Lymphadenopathy:    She has no cervical adenopathy.  Neurological: She is alert and oriented to person, place, and time. No cranial nerve deficit. She exhibits normal muscle tone.  Skin: Skin is warm and dry. No rash noted. No erythema.  Psychiatric: She has a normal mood and  affect. Her behavior is normal. Judgment and thought content normal.     Assessment/Plan This is a 48 year old female admitted for anemia. 1.  Anemia: Symptomatic; currently undergoing transfusion 1 unit RBCs.  Likely secondary to menorrhagia.  The patient has had hysteroscopy and D&C in the past.  She does not currently have a gynecologist.  Given her current need for anticoagulation and likely recurrent anemia due to menorrhagia I have consulted gynecology for further guidance. 2.  DVT: With clot of the popliteal vein.  This is a recurrent problem.  Consult vascular surgery for input.  Therapeutic Lovenox for now. 3.  Morbid obesity: BMI is 41; encouraged healthy diet and exercise 4.  DVT prophylaxis: Therapeutic anticoagulation 5.  GI prophylaxis: PPI per home regimen The patient is a full code.  Time spent on admission orders and patient care approximately 45 minutes  Harrie Foreman, MD 03/03/2019, 7:27 AM

## 2019-03-03 NOTE — Consult Note (Signed)
[facility] Consult Note  MRN : [id number]  Lisa [name] is a 48 y.o. (Aug 01, 1970) female who presents with chief complaint of  Chief Complaint  Patient presents with  . Leg Pain  . Shortness of Breath   History of Present Illness:  The patient is a 48 year old female with multiple medical issues (see below) who presented to the [facility] emergency department yesterday evening with a chief complaint of right lower extremity pain.  Patient endorses a past medical history including DVT and pulmonary embolism.  Patient has been on oral anticoagulation in the past however is not currently on any at this time.  Patient has been off anticoagulation for greater than a year.  Patient has never been to a hematologist even though she has been diagnosed with DVT and PEs in the past.  Patient presented with progressively worsening right lower extremity calf pain for approximately 5 to 6 days.  States it felt similar in discomfort when she was diagnosed with a DVT in the past this is what prompted her to seek medical attention.  Patient denied any shortness of breath or chest pain.  Denies any fever, nausea vomiting.  Venous duplex of the right lower extremity conducted on 03/03/2019 was notable for: 1. Occlusive thrombus in the posterior tibial vein. No thrombus proximal to the knee. 2. Superficial thrombophlebitis in the right mid calf.  Vascular was consulted by Dr. Marcille Blanco in regard to further recommendations Current Facility-Administered Medications  Medication Dose Route Frequency Provider Last Rate Last Dose  . acetaminophen (TYLENOL) tablet 650 mg  650 mg Oral Q6H PRN Harrie Foreman, MD       Or  . acetaminophen (TYLENOL) suppository 650 mg  650 mg Rectal Q6H PRN Harrie Foreman, MD      . docusate sodium (COLACE) capsule 100 mg  100 mg Oral BID Harrie Foreman, MD      . enoxaparin (LOVENOX) injection 125 mg  125 mg  Subcutaneous Q12H Hall, Scott A, RPH      . ondansetron (ZOFRAN) tablet 4 mg  4 mg Oral Q6H PRN Harrie Foreman, MD       Or  . ondansetron [facility]) injection 4 mg  4 mg Intravenous Q6H PRN Harrie Foreman, MD       Current Outpatient Medications  Medication Sig Dispense Refill  . AIMOVIG 140 MG/ML SOAJ Inject 140 mg into the skin every 30 (thirty) days.    Marland Kitchen albuterol (PROVENTIL HFA;VENTOLIN HFA) 108 (90 Base) MCG/ACT inhaler Inhale 2 puffs into the lungs every 6 (six) hours as needed for wheezing or shortness of breath. 1 Inhaler 2  . azaTHIOprine (IMURAN) 50 MG tablet Take 50 mg by mouth daily.    . Chromium 500 MCG TABS Take 500 mcg by mouth daily.    . Ipratropium-Albuterol (COMBIVENT) 20-100 MCG/ACT AERS respimat Inhale 1 puff into the lungs every 6 (six) hours.    Marland Kitchen omeprazole (PRILOSEC) 40 MG capsule Take 40 mg by mouth daily.    . predniSONE (DELTASONE) 10 MG tablet Take 20 mg by mouth daily.    . Probiotic Product (ACIDOPHILUS/BIFIDUS PO) Take 1 capsule by mouth daily.    . propranolol (INDERAL) 20 MG tablet Take 20 mg by mouth 2 (two) times daily.    . SUMAtriptan (IMITREX) 100 MG tablet TAKE 1 TABLET BY MOUTH ONCE DAILY AS NEEDED FOR MIGRAINE FOR UP TO 1 DOSE MAY TAKE A SECOND DOSE AFTER 2  HOURS IF NEEDED     Past Medical History:  Diagnosis Date  . Abdominal pain   . Anemia    hx of anemic  . Asthma   . Bipolar 1 disorder ([place])   . Blood in stool   . Change in voice   . Chest pain   . Constipation   . Depression   . Diverticulitis   . DVT (deep venous thrombosis) ([place]) 12/13/2014  . Dysrhythmia   . Fatigue   . Gallstones 04/10/2011   with biliary pancreatitis  . GERD (gastroesophageal reflux disease)   . Migraines   . MRSA carrier   . Nausea   . Pancreatitis   . PTSD (post-traumatic stress disorder)   . Pulmonary embolism ([place]) 12/13/2014  . Sleep apnea    cpap  . Trouble swallowing    Past Surgical History:  Procedure Laterality Date  . BRAVO [place] STUDY   06/02/2011   Procedure: BRAVO [place] STUDY;  Surgeon: Inda Castle, MD;  Location: [facility] ENDOSCOPY;  Service: Endoscopy;  Laterality: N/A;  . CHOLECYSTECTOMY    . DILATION AND CURETTAGE OF UTERUS    . ESOPHAGOGASTRODUODENOSCOPY  06/02/2011   Procedure: ESOPHAGOGASTRODUODENOSCOPY (EGD);  Surgeon: Inda Castle, MD;  Location: [facility];  Service: Endoscopy;  Laterality: N/A;  . LAPAROSCOPIC CHOLECYSTECTOMY W/ CHOLANGIOGRAPHY  04/25/2011   Dr Margot Chimes  . NASAL POLYP SURGERY     polyp from vocal cords  . WISDOM TOOTH EXTRACTION     Social History Social History   Tobacco Use  . Smoking status: Never Smoker  . Smokeless tobacco: Never Used  Substance Use Topics  . Alcohol use: Yes    Comment: occ  . Drug use: No   Family History Family History  Problem Relation Age of Onset  . Diabetes Mother   . Pancreatic cancer Mother   . Lung cancer Father   . Cancer Paternal Aunt breast  . Anesthesia problems Neg Hx   . Hypotension Neg Hx   . Malignant hyperthermia Neg Hx   . Pseudochol deficiency Neg Hx   Denies family history of peripheral artery disease, venous disease and bleeding/clotting disorders  Allergies  Allergen Reactions  . Lisinopril Anaphylaxis   REVIEW OF SYSTEMS (Negative unless checked)  Constitutional: [] Weight loss  [] Fever  [] Chills Cardiac: [] Chest pain   [] Chest pressure   [] Palpitations   [] Shortness of breath when laying flat   [] Shortness of breath at rest   [] Shortness of breath with exertion. Vascular:  [x] Pain in legs with walking   [x] Pain in legs at rest   [x] Pain in legs when laying flat   [] Claudication   [] Pain in feet when walking  [] Pain in feet at rest  [] Pain in feet when laying flat   [x] History of DVT   [] Phlebitis   [] Swelling in legs   [] Varicose veins   [] Non-healing ulcers Pulmonary:   [] Uses home oxygen   [] Productive cough   [] Hemoptysis   [] Wheeze  [] COPD   [] Asthma Neurologic:  [] Dizziness  [] Blackouts   [] Seizures   [] History of stroke    [] History of TIA  [] Aphasia   [] Temporary blindness   [] Dysphagia   [] Weakness or numbness in arms   [] Weakness or numbness in legs Musculoskeletal:  [] Arthritis   [] Joint swelling   [] Joint pain   [] Low back pain Hematologic:  [] Easy bruising  [] Easy bleeding   [] Hypercoagulable state   [x] Anemic  [] Hepatitis Gastrointestinal:  [] Blood in stool   [] Vomiting blood  [] Gastroesophageal reflux/heartburn   []   Difficulty swallowing. Genitourinary:  [] Chronic kidney disease   [] Difficult urination  [] Frequent urination  [] Burning with urination   [] Blood in urine Skin:  [] Rashes   [] Ulcers   [] Wounds Psychological:  [] History of anxiety   []  History of major depression.  Physical Examination  Vitals:   03/03/19 0511 03/03/19 0713 03/03/19 0900 03/03/19 1030  BP: (!) 107/42 (!) 105/55 (!) 122/56 134/90  Pulse: 64 66 66 80  Resp: 16 18 16 17   Temp:    98.2 F (36.8 C)  TempSrc:    Oral  SpO2: 100% 97% 97% 100%  Weight:      Height:       Body mass index is 41.05 kg/m. Gen:  WD/WN, NAD Head: /AT, No temporalis wasting. Prominent temp pulse not noted. Ear/Nose/Throat: Hearing grossly intact, nares w/o erythema or drainage, oropharynx w/o Erythema/Exudate Eyes: Sclera non-icteric, conjunctiva clear Neck: Trachea midline.  No JVD.  Pulmonary:  Good air movement, respirations not labored, equal bilaterally.  Cardiac: RRR, normal S1, S2. Vascular:  Vessel Right Left  Radial Palpable Palpable  Ulnar Palpable Palpable  Brachial Palpable Palpable  Carotid Palpable, without bruit Palpable, without bruit  Aorta Not palpable N/A  Femoral Palpable Palpable  Popliteal Palpable Palpable  PT Palpable Palpable  DP Palpable Palpable   Right lower extremity: Thigh soft.  Calf soft.  Extremities warm distally to toes.  Tender to palpation to the calf.  Good capillary refill.  There is no acute vascular compromise to the extremity at this time.  Gastrointestinal: soft, non-tender/non-distended. No  guarding/reflex.  Musculoskeletal: M/S 5/5 throughout.  Extremities without ischemic changes.  No deformity or atrophy. No edema. Neurologic: Sensation grossly intact in extremities.  Symmetrical.  Speech is fluent. Motor exam as listed above. Psychiatric: Judgment intact, Mood & affect appropriate for pt's clinical situation. Dermatologic: No rashes or ulcers noted.  No cellulitis or open wounds. Lymph : No Cervical, Axillary, or Inguinal lymphadenopathy.  CBC Lab Results  Component Value Date   WBC 13.6 (H) 03/02/2019   HGB 6.6 (L) 03/03/2019   HCT 23.9 (L) 03/03/2019   MCV 63.7 (L) 03/02/2019   PLT 402 (H) 03/02/2019   BMET    Component Value Date/Time   NA 136 03/02/2019 2304   NA 139 08/17/2014 0436   K 3.8 03/02/2019 2304   K 3.6 08/17/2014 0436   CL 107 03/02/2019 2304   CL 105 08/17/2014 0436   CO2 24 03/02/2019 2304   CO2 27 08/17/2014 0436   GLUCOSE 102 (H) 03/02/2019 2304   GLUCOSE 80 08/17/2014 0436   BUN 8 03/02/2019 2304   BUN 11 08/17/2014 0436   CREATININE 0.71 03/02/2019 2304   CREATININE 0.85 08/17/2014 0436   CREATININE 0.78 05/14/2011 0858   CALCIUM 8.9 03/02/2019 2304   CALCIUM 8.4 (L) 08/17/2014 0436   GFRNONAA >60 03/02/2019 2304   GFRNONAA >60 08/17/2014 0436   GFRNONAA >60 03/19/2014 0432   GFRAA >60 03/02/2019 2304   GFRAA >60 08/17/2014 0436   GFRAA >60 03/19/2014 0432   Estimated Creatinine Clearance: 122.4 mL/min (by C-G formula based on SCr of 0.71 mg/dL).  COAG Lab Results  Component Value Date   INR 1.0 08/17/2014   INR 0.9 07/26/2014   Radiology Dg Chest 1 View  Result Date: 03/03/2019 CLINICAL DATA:  Shortness of breath. Right leg pain. EXAM: CHEST  1 VIEW COMPARISON:  CT 05/19/2017, radiograph 02/27/2017 FINDINGS: Unchanged heart size and mediastinal contours. Multifocal regions of scarring and architectural distortion are  not significantly changed from prior imaging. No acute airspace disease. No pleural fluid or pneumothorax.  Unchanged osseous structures. IMPRESSION: Multifocal regions of scarring are not significantly changed from prior imaging consistent with interstitial lung disease. No superimposed acute abnormality. Electronically Signed   By: Keith Rake M.D.   On: 03/03/2019 01:05   US Venous Img Lower Unilateral Right  Result Date: 03/03/2019 CLINICAL DATA:  Right leg pain for 5 days. EXAM: RIGHT LOWER EXTREMITY VENOUS DOPPLER ULTRASOUND TECHNIQUE: Gray-scale sonography with graded compression, as well as color Doppler and duplex ultrasound were performed to evaluate the lower extremity deep venous systems from the level of the common femoral vein and including the common femoral, femoral, profunda femoral, popliteal and calf veins including the posterior tibial, peroneal and gastrocnemius veins when visible. The superficial great saphenous vein was also interrogated. Spectral Doppler was utilized to evaluate flow at rest and with distal augmentation maneuvers in the common femoral, femoral and popliteal veins. COMPARISON:  None. FINDINGS: Contralateral Common Femoral Vein: Respiratory phasicity is normal and symmetric with the symptomatic side. No evidence of thrombus. Normal compressibility. Common Femoral Vein: No evidence of thrombus. Normal compressibility, respiratory phasicity and response to augmentation. Saphenofemoral Junction: No evidence of thrombus. Normal compressibility and flow on color Doppler imaging. Profunda Femoral Vein: No evidence of thrombus. Normal compressibility and flow on color Doppler imaging. Femoral Vein: No evidence of thrombus. Normal compressibility, respiratory phasicity and response to augmentation. Popliteal Vein: No evidence of thrombus. Normal compressibility, respiratory phasicity and response to augmentation. Calf Veins: Occlusive thrombus in the posterior tibial vein with lack of compressibility. No evidence of thrombus in the peroneal vein. Superficial Great Saphenous Vein: No  evidence of thrombus. Normal compressibility. Venous Reflux:  None. Other Findings: Superficial thrombus in the right mid calf in the area of patient pain. IMPRESSION: 1. Occlusive thrombus in the posterior tibial vein. No thrombus proximal to the knee. 2. Superficial thrombophlebitis in the right mid calf. These results were called by telephone at the time of interpretation on 03/03/2019 at 12:55 am to Dr. Harvest Dark , who verbally acknowledged these results. Electronically Signed   By: Keith Rake M.D.   On: 03/03/2019 00:55   Assessment/Plan The patient is a 48 year old female with multiple medical issues (see below) who presented to the [facility] emergency department yesterday evening with a chief complaint of right lower extremity pain. 1.  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  Patient has been off of oral anticoagulation for over a year.  Patient presents again with an unprovoked DVT of the right lower extremity.  Patient has been started on therapeutic Lovenox.  Would transition to Eliquis 5 mg twice daily.  There is no indication for venous lysis as the patient's DVT is below the knee only in one of the tibial veins.  We will be happy to follow patient in the outpatient setting. 2.  Possible clotting disorder: Patient states that she has not ever had a work-up with a hematologist.  Being that she has had unprovoked DVT/PE in the past and now presents with another would certainly warrant a hematology work-up. 3.  Anemia: Patient currently transfused last night and will be getting transfused again today.  Patient's primary team is following this.  Discussed with Dr. Mayme Genta, PA-C  03/03/2019 11:49 AM  This note was created with Dragon medical transcription system.  Any error is purely unintentional

## 2019-03-03 NOTE — Plan of Care (Signed)

## 2019-03-03 NOTE — Consult Note (Signed)
Obstetrics & Gynecology Consult H&P   Consulting Department: Hospitalist  Consulting Physician: Rosilyn Mings, MD  Consulting Question: Menorrhagia to anemia   History of Present Illness: Patient is a 48 y.o. Lisa [name] presenting with acute recurrent DVT, and menorrhagia to anemia with Hgb 6.6 on admission.  The patient reports LMP of 02/14/2019 to 02/20/2019.  Currently reports not vaginal bleeding.  She reports regular monthly menses lasting 6-10 days.  She reports heavy menstrual flow having to wear two pads at times.  She does report passage of clots and associated dysmenorrhea.  Occasional intermenstrual spotting has been noted by the patient.  She also has a history of endometrial polyps with prior hysteroscopy D&C.  Some vasomotor symptoms.  Normal TSH obtained this admission.  The patient is being admitted to hospitalist service to start anticoagulation and blood transfusion.    Review of Systems:10 point review of systems  Past Medical History:  Past Medical History:  Diagnosis Date  . Abdominal pain   . Anemia    hx of anemic  . Asthma   . Bipolar 1 disorder ([place])   . Blood in stool   . Change in voice   . Chest pain   . Constipation   . Depression   . Diverticulitis   . DVT (deep venous thrombosis) ([place]) 12/13/2014  . Dysrhythmia   . Fatigue   . Gallstones 04/10/2011   with biliary pancreatitis  . GERD (gastroesophageal reflux disease)   . Migraines   . MRSA carrier   . Nausea   . Pancreatitis   . PTSD (post-traumatic stress disorder)   . Pulmonary embolism ([place]) 12/13/2014  . Sleep apnea    cpap  . Trouble swallowing     Past Surgical History:  Past Surgical History:  Procedure Laterality Date  . BRAVO [place] STUDY  06/02/2011   Procedure: BRAVO [place] STUDY;  Surgeon: Inda Castle, MD;  Location: [facility];  Service: Endoscopy;  Laterality: N/A;  . CHOLECYSTECTOMY    . DILATION AND CURETTAGE OF UTERUS    . ESOPHAGOGASTRODUODENOSCOPY  06/02/2011   Procedure:  ESOPHAGOGASTRODUODENOSCOPY (EGD);  Surgeon: Inda Castle, MD;  Location: [facility];  Service: Endoscopy;  Laterality: N/A;  . LAPAROSCOPIC CHOLECYSTECTOMY W/ CHOLANGIOGRAPHY  04/25/2011   Dr Margot Chimes  . NASAL POLYP SURGERY     polyp from vocal cords  . WISDOM TOOTH EXTRACTION      Family History:  Family History  Problem Relation Age of Onset  . Diabetes Mother   . Pancreatic cancer Mother   . Lung cancer Father   . Cancer Paternal Aunt breast  . Anesthesia problems Neg Hx   . Hypotension Neg Hx   . Malignant hyperthermia Neg Hx   . Pseudochol deficiency Neg Hx     Social History:  Social History   Socioeconomic History  . Marital status: Single    Spouse name: Not on file  . Number of children: 7  . Years of education: Not on file  . Highest education level: Not on file  Occupational History  . Occupation: Designer, fashion/clothing: Scientist, research (physical sciences)  Social Needs  . Financial resource strain: Not on file  . Food insecurity    Worry: Not on file    Inability: Not on file  . Transportation needs    Medical: Not on file    Non-medical: Not on file  Tobacco Use  . Smoking status: Never Smoker  . Smokeless tobacco: Never Used  Substance and  Sexual Activity  . Alcohol use: Yes    Comment: occ  . Drug use: No  . Sexual activity: Not on file  Lifestyle  . Physical activity    Days per week: Not on file    Minutes per session: Not on file  . Stress: Not on file  Relationships  . Social Herbalist on phone: Not on file    Gets together: Not on file    Attends religious service: Not on file    Active member of club or organization: Not on file    Attends meetings of clubs or organizations: Not on file    Relationship status: Not on file  . Intimate partner violence    Fear of current or ex partner: Not on file    Emotionally abused: Not on file    Physically abused: Not on file    Forced sexual activity: Not on file  Other Topics Concern  .  Not on file  Social History Narrative  . Not on file    Allergies:  Allergies  Allergen Reactions  . Lisinopril Anaphylaxis    Medications: Prior to Admission medications   Medication Sig Start Date End Date Taking? Authorizing Provider  AIMOVIG 140 MG/ML SOAJ Inject 140 mg into the skin every 30 (thirty) days. 02/19/19  Yes [provider]  albuterol (PROVENTIL HFA;VENTOLIN HFA) 108 (90 Base) MCG/ACT inhaler Inhale 2 puffs into the lungs every 6 (six) hours as needed for wheezing or shortness of breath. 02/28/17  Yes Alfred Levins, [place], MD  azaTHIOprine (IMURAN) 50 MG tablet Take 50 mg by mouth daily. 02/02/19  Yes [provider]  Chromium 500 MCG TABS Take 500 mcg by mouth daily.   Yes [provider]  Ipratropium-Albuterol (COMBIVENT) 20-100 MCG/ACT AERS respimat Inhale 1 puff into the lungs every 6 (six) hours.   Yes [provider]  omeprazole (PRILOSEC) 40 MG capsule Take 40 mg by mouth daily. 01/03/19  Yes [provider]  predniSONE (DELTASONE) 10 MG tablet Take 20 mg by mouth daily.   Yes [provider]  Probiotic Product (ACIDOPHILUS/BIFIDUS PO) Take 1 capsule by mouth daily.   Yes [provider]  propranolol (INDERAL) 20 MG tablet Take 20 mg by mouth 2 (two) times daily. 01/03/19 01/03/20 Yes [provider]  SUMAtriptan (IMITREX) 100 MG tablet TAKE 1 TABLET BY MOUTH ONCE DAILY AS NEEDED FOR MIGRAINE FOR UP TO 1 DOSE MAY TAKE A SECOND DOSE AFTER 2 HOURS IF NEEDED 02/10/19  Yes [provider]    Physical Exam Vitals: Blood pressure 134/90, pulse 80, temperature 98.2 F (36.8 C), temperature source Oral, resp. rate 17, height 5\' 8"  (1.727 m), weight 122.5 kg, SpO2 100 %. General: NAD, appears stated age, well nourished HEENT: normocephalic, anicteric Pulmonary: No increased work of breathing Extremities: no edema, erythema, or tenderness Neurologic: Grossly intact Psychiatric: mood appropriate,  affect full  Labs: Results for orders placed or performed during the hospital encounter of 03/03/19 (from the past 72 hour(s))  CBC     Status: Abnormal   Collection Time: 03/02/19 11:04 PM  Result Value Ref Range   WBC 13.6 (H) 4.0 - 10.5 K/uL   RBC 3.75 (L) 3.87 - 5.11 MIL/uL   Hemoglobin 6.7 (L) 12.0 - 15.0 g/dL    Comment: Reticulocyte Hemoglobin testing may be clinically indicated, consider ordering this additional test [id number]    HCT 23.9 (L) 36.0 - 46.0 %   MCV 63.7 (L) 80.0 -  100.0 fL   MCH 17.9 (L) 26.0 - 34.0 pg   MCHC 28.0 (L) 30.0 - 36.0 g/dL   RDW 21.9 (H) 11.5 - 15.5 %   Platelets 402 (H) 150 - 400 K/uL   nRBC 0.0 0.0 - 0.2 %    Comment: Performed at [facility], [address] [place], [place] [address]  Comprehensive metabolic panel     Status: Abnormal   Collection Time: 03/02/19 11:04 PM  Result Value Ref Range   Sodium 136 135 - 145 mmol/L   Potassium 3.8 3.5 - 5.1 mmol/L   Chloride 107 98 - 111 mmol/L   CO2 24 22 - 32 mmol/L   Glucose, Bld 102 (H) 70 - 99 mg/dL   BUN 8 6 - 20 mg/dL   Creatinine, Ser 0.71 0.44 - 1.00 mg/dL   Calcium 8.9 8.9 - 10.3 mg/dL   Total Protein 7.7 6.5 - 8.1 g/dL   Albumin 3.9 3.5 - 5.0 g/dL   AST 15 15 - 41 U/L   ALT 12 0 - 44 U/L   Alkaline Phosphatase 42 38 - 126 U/L   Total Bilirubin 0.4 0.3 - 1.2 mg/dL   GFR calc non Af Amer >60 >60 mL/min   GFR calc Af Amer >60 >60 mL/min   Anion gap 5 5 - 15    Comment: Performed at [facility], [address] [place], [place] [address]  TSH     Status: None   Collection Time: 03/02/19 11:04 PM  Result Value Ref Range   TSH 1.888 0.350 - 4.500 uIU/mL    Comment: Performed by a 3rd Generation assay with a functional sensitivity of <=0.01 uIU/mL. Performed at [facility], [address] [place], [place] [address]   Hemoglobin and hematocrit, blood     Status: Abnormal   Collection Time: 03/03/19  1:00 AM  Result Value Ref Range   Hemoglobin 6.6  (L) 12.0 - 15.0 g/dL   HCT 23.9 (L) 36.0 - 46.0 %    Comment: Performed at [facility] [address] [place], [place] [address]  Type and screen [place]     Status: None (Preliminary result)   Collection Time: 03/03/19  1:00 AM  Result Value Ref Range   ABO/RH(D) B POS    Antibody Screen NEG    Sample Expiration 03/06/2019,2359    Unit Number [id number]    Blood Component Type RED CELLS,LR    Unit division 00    Status of Unit ISSUED    Transfusion Status OK TO TRANSFUSE    Crossmatch Result      Compatible Performed at [facility], [address] [place], [place] [address]   Prepare RBC     Status: None   Collection Time: 03/03/19  1:33 AM  Result Value Ref Range   Order Confirmation      ORDER PROCESSED BY BLOOD BANK Performed at [facility], [address] [place], [place] [address]   Folate     Status: None   Collection Time: 03/03/19  7:34 AM  Result Value Ref Range   Folate 16.7 >5.9 ng/mL    Comment: Performed at [facility], [address] [place], [place] [address]  Iron and TIBC     Status: Abnormal   Collection Time: 03/03/19  7:34 AM  Result Value Ref Range   Iron 13 (L) 28 - 170 ug/dL   TIBC 501 (H) 250 - 450 ug/dL  Saturation Ratios 3 (L) 10.4 - 31.8 %   UIBC 488 ug/dL    Comment: Performed at [facility], [address] [place], [place] [address]  Ferritin     Status: Abnormal   Collection Time: 03/03/19  7:34 AM  Result Value Ref Range   Ferritin 5 (L) 11 - 307 ng/mL    Comment: Performed at [facility], [address] [place], [place] [address]  Reticulocytes     Status: Abnormal   Collection Time: 03/03/19  7:34 AM  Result Value Ref Range   Retic Ct Pct 1.1 0.4 - 3.1 %   RBC. 3.79 (L) 3.87 - 5.11 MIL/uL   Retic Count, Absolute 42.8 19.0 - 186.0 K/uL   Immature Retic Fract 36.8 (H) 2.3 - 15.9 %    Comment: Performed at [facility], [address] [place], [place] [address]    Imaging Dg Chest 1 View  Result Date: 03/03/2019 CLINICAL DATA:  Shortness of breath. Right leg pain. EXAM: CHEST  1 VIEW COMPARISON:  CT 05/19/2017, radiograph 02/27/2017 FINDINGS: Unchanged heart size and mediastinal contours. Multifocal regions of scarring and architectural distortion are not significantly changed from prior imaging. No acute airspace disease. No pleural fluid or pneumothorax. Unchanged osseous structures. IMPRESSION: Multifocal regions of scarring are not significantly changed from prior imaging consistent with interstitial lung disease. No superimposed acute abnormality. Electronically Signed   By: Keith Rake M.D.   On: 03/03/2019 01:05   US Venous Img Lower Unilateral Right  Result Date: 03/03/2019 CLINICAL DATA:  Right leg pain for 5 days. EXAM: RIGHT LOWER EXTREMITY VENOUS DOPPLER ULTRASOUND TECHNIQUE: Gray-scale sonography with graded compression, as well as color Doppler and duplex ultrasound were performed to evaluate the lower extremity deep venous systems from the level of the common femoral vein and including the common femoral, femoral, profunda femoral, popliteal and calf veins including the posterior tibial, peroneal and gastrocnemius veins when visible. The superficial great saphenous vein was also interrogated. Spectral Doppler was utilized to evaluate flow at rest and with distal augmentation maneuvers in the common femoral, femoral and popliteal veins. COMPARISON:  None. FINDINGS: Contralateral Common Femoral Vein: Respiratory phasicity is normal and symmetric with the symptomatic side. No evidence of thrombus. Normal compressibility. Common Femoral Vein: No evidence of thrombus. Normal compressibility, respiratory phasicity and response to augmentation. Saphenofemoral Junction: No evidence of thrombus. Normal compressibility and flow on color Doppler imaging. Profunda Femoral Vein: No evidence of thrombus. Normal  compressibility and flow on color Doppler imaging. Femoral Vein: No evidence of thrombus. Normal compressibility, respiratory phasicity and response to augmentation. Popliteal Vein: No evidence of thrombus. Normal compressibility, respiratory phasicity and response to augmentation. Calf Veins: Occlusive thrombus in the posterior tibial vein with lack of compressibility. No evidence of thrombus in the peroneal vein. Superficial Great Saphenous Vein: No evidence of thrombus. Normal compressibility. Venous Reflux:  None. Other Findings: Superficial thrombus in the right mid calf in the area of patient pain. IMPRESSION: 1. Occlusive thrombus in the posterior tibial vein. No thrombus proximal to the knee. 2. Superficial thrombophlebitis in the right mid calf. These results were called by telephone at the time of interpretation on 03/03/2019 at 12:55 am to Dr. Harvest Dark , who verbally acknowledged these results. Electronically Signed   By: Keith Rake M.D.   On: 03/03/2019 00:55    Assessment: 48 y.o.  with menorrhagia to anemia, now being started on anticoagulation for recurrent DVT  Plan:  1) Discussed management  options for abnormal uterine bleeding including expectant, NSAIDs, tranexamic acid (Lysteda), oral progesterone (Provera, norethindrone, megace), Depo Provera, Levonorgestrel containing IUD, endometrial ablation (Novasure) or hysterectomy as definitive surgical management.  Discussed risks and benefits of each method.   Final management decision will hinge on results of patient's work up and whether an underlying etiology for the patients bleeding symptoms can be discerned.  We will conduct a basic work up examining using the PALM-COIEN classification system.  In the meantime the patient opts to trial provera 20mg  po daily while we await results of her ultrasound and labs.  Printed patient education handouts were given to the patient to review at home.  Bleeding precautions reviewed.  - will  arrange for ultrasound outpatient to assess endometrial stipe.  CT imgaging at [facility] 03/09/2017 unremarkable reproductive organs.  If no evidence of endometrial polyps will obtain endometrial biopsy at that time - will update pap outpatient - additional labs ordered - my office will contact the patient for outpatient follow up sometime in the coming week    Malachy Mood, MD, [place], [place] 03/03/2019, 11:36 AM

## 2019-03-03 NOTE — Telephone Encounter (Signed)
-----   Message from Malachy Mood, MD sent at 03/03/2019 12:54 PM EDT ----- Patient needs hospital follow up, with ultrasound sometime next week.  Order has been placed for ultrasound.   Malachy Mood, MD, [facility], [facility] 03/03/2019, 12:55 PM

## 2019-03-03 NOTE — Telephone Encounter (Signed)
Called and left voicemail for patient to call back to be schedule. Pre schedule appointment for 12 pm ultrasound and follow up on Wednesday, 03/08/19 with [place] at 2:30

## 2019-03-03 NOTE — Progress Notes (Signed)
Lisa [name] NAME: Lisa [name]    MR#:  [id number]  DATE OF BIRTH:  10/14/1970  SUBJECTIVE:  CHIEF COMPLAINT:   Chief Complaint  Patient presents with   Leg Pain   Shortness of Breath   -Patient has been having symptomatic anemia complaints with weakness, fatigue -Also presents with right calf pain and noted to have DVT.  REVIEW OF SYSTEMS:  Review of Systems  Constitutional: Positive for malaise/fatigue. Negative for chills and fever.  HENT: Negative for congestion, hearing loss, nosebleeds and sinus pain.   Eyes: Negative for blurred vision and double vision.  Respiratory: Negative for cough, shortness of breath and wheezing.   Cardiovascular: Negative for chest pain and palpitations.  Gastrointestinal: Negative for abdominal pain, constipation, diarrhea, nausea and vomiting.  Genitourinary: Negative for dysuria.  Musculoskeletal: Positive for myalgias.  Neurological: Positive for weakness. Negative for dizziness, focal weakness, seizures and headaches.  Psychiatric/Behavioral: Negative for depression.    DRUG ALLERGIES:   Allergies  Allergen Reactions   Lisinopril Anaphylaxis    VITALS:  Blood pressure 111/65, pulse 70, temperature 98.2 F (36.8 C), temperature source Oral, resp. rate 16, height 5\' 8"  (1.727 m), weight 122.5 kg, SpO2 98 %.  PHYSICAL EXAMINATION:  Physical Exam   GENERAL:  48 y.o.-year-old patient lying in the bed with no acute distress.  EYES: Pupils equal, round, reactive to light and accommodation. No scleral icterus. Extraocular muscles intact.  HEENT: Head atraumatic, normocephalic. Oropharynx and nasopharynx clear.  NECK:  Supple, no jugular venous distention. No thyroid enlargement, no tenderness.  LUNGS: Normal breath sounds bilaterally, no wheezing, rales,rhonchi or crepitation. No use of accessory muscles of respiration.  Decreased bibasilar breath sounds CARDIOVASCULAR: S1, S2  normal. No murmurs, rubs, or gallops.  ABDOMEN: Soft, nontender, nondistended. Bowel sounds present. No organomegaly or mass.  EXTREMITIES: No pedal edema, cyanosis, or clubbing.  Tenderness of the right calf with palpable superficial vein cord NEUROLOGIC: Cranial nerves II through XII are intact. Muscle strength 5/5 in all extremities. Sensation intact. Gait not checked.  PSYCHIATRIC: The patient is alert and oriented x 3.  SKIN: No obvious rash, lesion, or ulcer.    LABORATORY PANEL:   CBC Recent Labs  Lab 03/02/19 2304 03/03/19 0100  WBC 13.6*  --   HGB 6.7* 6.6*  HCT 23.9* 23.9*  PLT 402*  --    ------------------------------------------------------------------------------------------------------------------  Chemistries  Recent Labs  Lab 03/02/19 2304  NA 136  K 3.8  CL 107  CO2 24  GLUCOSE 102*  BUN 8  CREATININE 0.71  CALCIUM 8.9  AST 15  ALT 12  ALKPHOS 42  BILITOT 0.4   ------------------------------------------------------------------------------------------------------------------  Cardiac Enzymes No results for input(s): TROPONINI in the last 168 hours. ------------------------------------------------------------------------------------------------------------------  RADIOLOGY:  Dg Chest 1 View  Result Date: 03/03/2019 CLINICAL DATA:  Shortness of breath. Right leg pain. EXAM: CHEST  1 VIEW COMPARISON:  CT 05/19/2017, radiograph 02/27/2017 FINDINGS: Unchanged heart size and mediastinal contours. Multifocal regions of scarring and architectural distortion are not significantly changed from prior imaging. No acute airspace disease. No pleural fluid or pneumothorax. Unchanged osseous structures. IMPRESSION: Multifocal regions of scarring are not significantly changed from prior imaging consistent with interstitial lung disease. No superimposed acute abnormality. Electronically Signed   By: Keith Rake M.D.   On: 03/03/2019 01:05   US Venous Img Lower  Unilateral Right  Result Date: 03/03/2019 CLINICAL DATA:  Right leg pain for 5 days. EXAM: RIGHT  LOWER EXTREMITY VENOUS DOPPLER ULTRASOUND TECHNIQUE: Gray-scale sonography with graded compression, as well as color Doppler and duplex ultrasound were performed to evaluate the lower extremity deep venous systems from the level of the common femoral vein and including the common femoral, femoral, profunda femoral, popliteal and calf veins including the posterior tibial, peroneal and gastrocnemius veins when visible. The superficial great saphenous vein was also interrogated. Spectral Doppler was utilized to evaluate flow at rest and with distal augmentation maneuvers in the common femoral, femoral and popliteal veins. COMPARISON:  None. FINDINGS: Contralateral Common Femoral Vein: Respiratory phasicity is normal and symmetric with the symptomatic side. No evidence of thrombus. Normal compressibility. Common Femoral Vein: No evidence of thrombus. Normal compressibility, respiratory phasicity and response to augmentation. Saphenofemoral Junction: No evidence of thrombus. Normal compressibility and flow on color Doppler imaging. Profunda Femoral Vein: No evidence of thrombus. Normal compressibility and flow on color Doppler imaging. Femoral Vein: No evidence of thrombus. Normal compressibility, respiratory phasicity and response to augmentation. Popliteal Vein: No evidence of thrombus. Normal compressibility, respiratory phasicity and response to augmentation. Calf Veins: Occlusive thrombus in the posterior tibial vein with lack of compressibility. No evidence of thrombus in the peroneal vein. Superficial Great Saphenous Vein: No evidence of thrombus. Normal compressibility. Venous Reflux:  None. Other Findings: Superficial thrombus in the right mid calf in the area of patient pain. IMPRESSION: 1. Occlusive thrombus in the posterior tibial vein. No thrombus proximal to the knee. 2. Superficial thrombophlebitis in the  right mid calf. These results were called by telephone at the time of interpretation on 03/03/2019 at 12:55 am to Dr. Harvest Dark , who verbally acknowledged these results. Electronically Signed   By: Keith Rake M.D.   On: 03/03/2019 00:55    EKG:   Orders placed or performed during the hospital encounter of 03/03/19   ED EKG   ED EKG    ASSESSMENT AND PLAN:   48 year old female with past medical history significant for bipolar,NSIP, sleep apnea, history of DVT treated with anticoagulation in the past presents to hospital secondary to worsening fatigue and right calf pain.  1.  Right leg DVT-occlusive thrombus in posterior tibial vein, no clot proximal to the knee noted. -Prior history of unprovoked DVT and was on anticoagulation for an ER. -Now with new acute clot, will need lifelong anticoagulation.  Also hypercoagulation work-up to be done as outpatient at cancer center -Right mid calf superficial thrombophlebitis noted -Appreciate vascular consult. -Started on oral Eliquis and instead of subcutaneous Lovenox -Monitor closely due to anemia  2.  Acute on chronic iron deficiency anemia-received 2 units of blood transfusion today. -We will start on IV iron. -Has significant blood loss with dysmenorrhea.  Appreciate GYN consult -Trial of Provera while awaiting for the results -GI follow-up as outpatient  3.  NSIP-follows with pulmonary as outpatient -Breathing is stable.  Continue azathioprine, prednisone and Combivent  4.  Bipolar/PTSD-verify home medications and restart  5.  DVT prophylaxis-patient already on Eliquis    All the records are reviewed and case discussed with Care Management/Social Workerr. Management plans discussed with the patient, family and they are in agreement.  CODE STATUS: Full Code  TOTAL TIME TAKING CARE OF THIS PATIENT: 38  minutes.   POSSIBLE D/C IN 1-2 DAYS, DEPENDING ON CLINICAL CONDITION.   Gladstone Lighter M.D on 03/03/2019 at  2:26 PM  Between 7am to 6pm - Pager - [phone]  After 6pm go to [email or web address] - password [email or web address]  [employer]  Office  [phone]  CC: Primary care physician; [facility]

## 2019-03-03 NOTE — ED Notes (Signed)
ED TO INPATIENT HANDOFF REPORT  ED Nurse Name and Phone #:  Mareon Robinette/Mac [phone]  S Name/Age/Gender Lisa [name] 48 y.o. female Room/Bed: ED37A/ED37A  Code Status   Code Status: Full Code  Home/SNF/Other Home Patient oriented to: self, place, time and situation Is this baseline? Yes   Triage Complete: Triage complete  Chief Complaint Leg pain  Triage Note Patient c/o right lower leg pain with multiple knots in legs. 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 Patient c/o SOB.    Allergies Allergies  Allergen Reactions  . Lisinopril Anaphylaxis    Level of Care/Admitting Diagnosis ED Disposition    ED Disposition Condition [facility] Area: [place] [[id number]]  Level of Care: Med-Surg [16]  Covid Evaluation: Asymptomatic Screening Protocol (No Symptoms)  Diagnosis: Symptomatic anemia [4098119]  Admitting Physician: Harrie Foreman [[id number]]  Attending Physician: Harrie Foreman [phone]  PT Class (Do Not Modify): Observation [104]  PT Acc Code (Do Not Modify): Observation [10022]       B Medical/Surgery History Past Medical History:  Diagnosis Date  . Abdominal pain   . Anemia    hx of anemic  . Asthma   . Bipolar 1 disorder ([place])   . Blood in stool   . Change in voice   . Chest pain   . Constipation   . Depression   . Diverticulitis   . DVT (deep venous thrombosis) ([place]) 12/13/2014  . Dysrhythmia   . Fatigue   . Gallstones 04/10/2011   with biliary pancreatitis  . GERD (gastroesophageal reflux disease)   . Migraines   . MRSA carrier   . Nausea   . Pancreatitis   . PTSD (post-traumatic stress disorder)   . Pulmonary embolism ([place]) 12/13/2014  . Sleep apnea    cpap  . Trouble swallowing    Past Surgical History:  Procedure Laterality Date  . BRAVO [place] STUDY  06/02/2011   Procedure: BRAVO [place] STUDY;  Surgeon: Inda Castle, MD;  Location: [facility];  Service: Endoscopy;  Laterality: N/A;  . CHOLECYSTECTOMY    .  DILATION AND CURETTAGE OF UTERUS    . ESOPHAGOGASTRODUODENOSCOPY  06/02/2011   Procedure: ESOPHAGOGASTRODUODENOSCOPY (EGD);  Surgeon: Inda Castle, MD;  Location: [facility];  Service: Endoscopy;  Laterality: N/A;  . LAPAROSCOPIC CHOLECYSTECTOMY W/ CHOLANGIOGRAPHY  04/25/2011   Dr Margot Chimes  . NASAL POLYP SURGERY     polyp from vocal cords  . WISDOM TOOTH EXTRACTION       A IV Location/Drains/Wounds Patient Lines/Drains/Airways Status   Active Line/Drains/Airways    Name:   Placement date:   Placement time:   Site:   Days:   Peripheral IV 03/03/19 Right Antecubital   03/03/19    0059    Antecubital   less than 1          Intake/Output Last 24 hours  Intake/Output Summary (Last 24 hours) at 03/03/2019 1536 Last data filed at 03/03/2019 0235 Gross per 24 hour  Intake 640 ml  Output -  Net 640 ml    Labs/Imaging Results for orders placed or performed during the hospital encounter of 03/03/19 (from the past 48 hour(s))  CBC     Status: Abnormal   Collection Time: 03/02/19 11:04 PM  Result Value Ref Range   WBC 13.6 (H) 4.0 - 10.5 K/uL   RBC 3.75 (L) 3.87 - 5.11 MIL/uL   Hemoglobin 6.7 (L) 12.0 - 15.0 g/dL    Comment: Reticulocyte Hemoglobin testing  may be clinically indicated, consider ordering this additional test [id number]    HCT 23.9 (L) 36.0 - 46.0 %   MCV 63.7 (L) 80.0 - 100.0 fL   MCH 17.9 (L) 26.0 - 34.0 pg   MCHC 28.0 (L) 30.0 - 36.0 g/dL   RDW 21.9 (H) 11.5 - 15.5 %   Platelets 402 (H) 150 - 400 K/uL   nRBC 0.0 0.0 - 0.2 %    Comment: Performed at [facility], [address] [place], [place] [address]  Comprehensive metabolic panel     Status: Abnormal   Collection Time: 03/02/19 11:04 PM  Result Value Ref Range   Sodium 136 135 - 145 mmol/L   Potassium 3.8 3.5 - 5.1 mmol/L   Chloride 107 98 - 111 mmol/L   CO2 24 22 - 32 mmol/L   Glucose, Bld 102 (H) 70 - 99 mg/dL   BUN 8 6 - 20 mg/dL   Creatinine, Ser 0.71 0.44 - 1.00 mg/dL   Calcium 8.9  8.9 - 10.3 mg/dL   Total Protein 7.7 6.5 - 8.1 g/dL   Albumin 3.9 3.5 - 5.0 g/dL   AST 15 15 - 41 U/L   ALT 12 0 - 44 U/L   Alkaline Phosphatase 42 38 - 126 U/L   Total Bilirubin 0.4 0.3 - 1.2 mg/dL   GFR calc non Af Amer >60 >60 mL/min   GFR calc Af Amer >60 >60 mL/min   Anion gap 5 5 - 15    Comment: Performed at [facility], [address] [place], [place] [address]  TSH     Status: None   Collection Time: 03/02/19 11:04 PM  Result Value Ref Range   TSH 1.888 0.350 - 4.500 uIU/mL    Comment: Performed by a 3rd Generation assay with a functional sensitivity of <=0.01 uIU/mL. Performed at [facility], [address] [place], [place] [address]   Hemoglobin and hematocrit, blood     Status: Abnormal   Collection Time: 03/03/19  1:00 AM  Result Value Ref Range   Hemoglobin 6.6 (L) 12.0 - 15.0 g/dL   HCT 23.9 (L) 36.0 - 46.0 %    Comment: Performed at [facility], [address] [place], [place] [address]  Type and screen [place]     Status: None (Preliminary result)   Collection Time: 03/03/19  1:00 AM  Result Value Ref Range   ABO/RH(D) B POS    Antibody Screen NEG    Sample Expiration 03/06/2019,2359    Unit Number [id number]    Blood Component Type RED CELLS,LR    Unit division 00    Status of Unit ISSUED    Transfusion Status OK TO TRANSFUSE    Crossmatch Result      Compatible Performed at [facility], [address] [place], [place] [address]   Prepare RBC     Status: None   Collection Time: 03/03/19  1:33 AM  Result Value Ref Range   Order Confirmation      ORDER PROCESSED BY BLOOD BANK Performed at [facility], [address] [place], [place] [address]   Vitamin B12     Status: None   Collection Time: 03/03/19  7:34 AM  Result Value Ref Range   Vitamin B-12 198 180 - 914 pg/mL    Comment: (NOTE) This assay is not validated for testing neonatal or myeloproliferative  syndrome specimens for Vitamin B12 levels. Performed at [facility], [place] [address], [place], [place] [address]   Folate     Status: None   Collection Time: 03/03/19  7:34 AM  Result Value Ref Range   Folate 16.7 >5.9 ng/mL    Comment: Performed at [facility], [address] [place], [place] [address]  Iron and TIBC     Status: Abnormal   Collection Time: 03/03/19  7:34 AM  Result Value Ref Range   Iron 13 (L) 28 - 170 ug/dL   TIBC 501 (H) 250 - 450 ug/dL   Saturation Ratios 3 (L) 10.4 - 31.8 %   UIBC 488 ug/dL    Comment: Performed at [facility], [address], [place], [place] [address]  Ferritin     Status: Abnormal   Collection Time: 03/03/19  7:34 AM  Result Value Ref Range   Ferritin 5 (L) 11 - 307 ng/mL    Comment: Performed at [facility], [address] [place], [place] [address]  Reticulocytes     Status: Abnormal   Collection Time: 03/03/19  7:34 AM  Result Value Ref Range   Retic Ct Pct 1.1 0.4 - 3.1 %   RBC. 3.79 (L) 3.87 - 5.11 MIL/uL   Retic Count, Absolute 42.8 19.0 - 186.0 K/uL   Immature Retic Fract 36.8 (H) 2.3 - 15.9 %    Comment: Performed at [facility], [address] [place], [place] [address]   Dg Chest 1 View  Result Date: 03/03/2019 CLINICAL DATA:  Shortness of breath. Right leg pain. EXAM: CHEST  1 VIEW COMPARISON:  CT 05/19/2017, radiograph 02/27/2017 FINDINGS: Unchanged heart size and mediastinal contours. Multifocal regions of scarring and architectural distortion are not significantly changed from prior imaging. No acute airspace disease. No pleural fluid or pneumothorax. Unchanged osseous structures. IMPRESSION: Multifocal regions of scarring are not significantly changed from prior imaging consistent with interstitial lung disease. No superimposed acute abnormality. Electronically Signed   By: Keith Rake M.D.   On: 03/03/2019 01:05   US Venous Img Lower Unilateral Right  Result Date:  03/03/2019 CLINICAL DATA:  Right leg pain for 5 days. EXAM: RIGHT LOWER EXTREMITY VENOUS DOPPLER ULTRASOUND TECHNIQUE: Gray-scale sonography with graded compression, as well as color Doppler and duplex ultrasound were performed to evaluate the lower extremity deep venous systems from the level of the common femoral vein and including the common femoral, femoral, profunda femoral, popliteal and calf veins including the posterior tibial, peroneal and gastrocnemius veins when visible. The superficial great saphenous vein was also interrogated. Spectral Doppler was utilized to evaluate flow at rest and with distal augmentation maneuvers in the common femoral, femoral and popliteal veins. COMPARISON:  None. FINDINGS: Contralateral Common Femoral Vein: Respiratory phasicity is normal and symmetric with the symptomatic side. No evidence of thrombus. Normal compressibility. Common Femoral Vein: No evidence of thrombus. Normal compressibility, respiratory phasicity and response to augmentation. Saphenofemoral Junction: No evidence of thrombus. Normal compressibility and flow on color Doppler imaging. Profunda Femoral Vein: No evidence of thrombus. Normal compressibility and flow on color Doppler imaging. Femoral Vein: No evidence of thrombus. Normal compressibility, respiratory phasicity and response to augmentation. Popliteal Vein: No evidence of thrombus. Normal compressibility, respiratory phasicity and response to augmentation. Calf Veins: Occlusive thrombus in the posterior tibial vein with lack of compressibility. No evidence of thrombus in the peroneal vein. Superficial Great Saphenous Vein: No evidence of thrombus. Normal compressibility. Venous Reflux:  None. Other Findings: Superficial thrombus in the right mid calf in  the area of patient pain. IMPRESSION: 1. Occlusive thrombus in the posterior tibial vein. No thrombus proximal to the knee. 2. Superficial thrombophlebitis in the right mid calf. These results were  called by telephone at the time of interpretation on 03/03/2019 at 12:55 am to Dr. Harvest Dark , who verbally acknowledged these results. Electronically Signed   By: Keith Rake M.D.   On: 03/03/2019 00:55    Pending Labs Unresulted Labs (From admission, onward)    Start     Ordered   03/04/19 0500  CBC  Tomorrow morning,   STAT     03/03/19 0506   03/03/19 1143  Prolactin  Once,   STAT     03/03/19 1143   03/03/19 1143  FSH/LH  Once,   STAT     03/03/19 1143   03/03/19 1143  Estradiol  Once,   STAT     03/03/19 1143   03/03/19 1143  Testosterone,Free and Total  Once,   STAT     03/03/19 1143   03/03/19 0743  SARS CORONAVIRUS 2  Once,   R     03/03/19 0743   03/03/19 0737  Novel Coronavirus, NAA (hospital order; send-out to ref lab)  (Novel Coronavirus, NAA [facility] Order))  Once,   STAT    Question Answer Comment  Is this test for diagnosis or screening Screening   Symptomatic for COVID-19 as defined by CDC No   Hospitalized for COVID-19 No   Admitted to ICU for COVID-19 No   Previously tested for COVID-19 No   Resident in a congregate (group) care setting No   Employed in healthcare setting No   Pregnant No      03/03/19 0736          Vitals/Pain Today's Vitals   03/03/19 0900 03/03/19 0900 03/03/19 1030 03/03/19 1239  BP: (!) 122/56  134/90 111/65  Pulse: 66  80 70  Resp: _0 Temp:   98.2 F (36.8 C)   TempSrc:   Oral   SpO2: 97%  100% 98%  Weight:      Height:      PainSc:  3  2      Isolation Precautions Airborne and Contact precautions  Medications Medications  acetaminophen (TYLENOL) tablet 650 mg (has no administration in time range)    Or  acetaminophen (TYLENOL) suppository 650 mg (has no administration in time range)  docusate sodium (COLACE) capsule 100 mg (100 mg Oral Refused 03/03/19 1041)  ondansetron (ZOFRAN) tablet 4 mg (has no administration in time range)    Or  ondansetron (ZOFRAN) injection 4 mg (has no administration  in time range)  medroxyPROGESTERone (PROVERA) tablet 20 mg (has no administration in time range)  iron sucrose (VENOFER) 200 mg in sodium chloride 0.9 % 150 mL IVPB (has no administration in time range)  azaTHIOprine (IMURAN) tablet 50 mg (has no administration in time range)  Ipratropium-Albuterol (COMBIVENT) respimat 1 puff (has no administration in time range)  pantoprazole (PROTONIX) EC tablet 40 mg (has no administration in time range)  predniSONE (DELTASONE) tablet 20 mg (has no administration in time range)  propranolol (INDERAL) tablet 20 mg (has no administration in time range)  apixaban (ELIQUIS) tablet 10 mg (has no administration in time range)    Followed by  apixaban (ELIQUIS) tablet 5 mg (has no administration in time range)  0.9 %  sodium chloride infusion (0 mL/hr Intravenous Stopped 03/03/19 0441)  enoxaparin (LOVENOX) injection 125 mg (125  mg Subcutaneous Given 03/03/19 0201)    Mobility walks Low fall risk   Focused Assessments Cardiac Assessment Handoff:    Lab Results  Component Value Date   CKTOTAL 87 08/17/2014   CKMB < 0.5 (L) 08/17/2014   TROPONINI <0.03 02/28/2017   No results found for: DDIMER Does the Patient currently have chest pain? No  , Pulmonary Assessment Handoff:  Lung sounds:  diminished O2 Device: Room Air        R Recommendations: See Admitting Provider Note  Report given to:   Additional Notes:

## 2019-03-03 NOTE — ED Provider Notes (Signed)
[facility] Emergency Department Provider Note  Time seen: 12:45 AM  I have reviewed the triage vital signs and the nursing notes.   HISTORY  Chief Complaint Leg Pain and Shortness of Breath   HPI Lisa [name] is a 48 y.o. female with a past medical history of bipolar, gastric reflux, DVT/PE off of anticoagulation greater than 1 year presents to the emergency department for right leg pain.  According to the patient over the past 5 days she has been experiencing progressively worsening pain behind her right calf.  Patient states a history of DVTs in the past which this feels similar.  Denies any fever.  Does state slight shortness of breath but states it is baseline for her as she has interstitial lung disease per patient.  Denies any increased shortness of breath.  Denies any fever or new cough.  Past Medical History:  Diagnosis Date  . Abdominal pain   . Anemia    hx of anemic  . Asthma   . Bipolar 1 disorder ([place])   . Blood in stool   . Change in voice   . Chest pain   . Constipation   . Depression   . Diverticulitis   . DVT (deep venous thrombosis) ([place]) 12/13/2014  . Dysrhythmia   . Fatigue   . Gallstones 04/10/2011   with biliary pancreatitis  . GERD (gastroesophageal reflux disease)   . Migraines   . MRSA carrier   . Nausea   . Pancreatitis   . PTSD (post-traumatic stress disorder)   . Pulmonary embolism ([place]) 12/13/2014  . Sleep apnea    cpap  . Trouble swallowing     Patient Active Problem List   Diagnosis Date Noted  . Asthma without status asthmaticus 08/10/2017  . Depression 08/10/2017  . Obesity, Class III, BMI 40-49.9 (morbid obesity) ([place]) 08/10/2017  . Essential hypertension 04/16/2017  . DVT (deep venous thrombosis) ([place]) 12/13/2014  . Pulmonary embolism ([place]) 12/13/2014  . 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 Cough, persistent 04/30/2014  . Obstructive apnea 04/30/2014  . Endometrial polyp 09/30/2013   . High BMI 08/25/2013  . Dysmenorrhea 08/25/2013  . Abdominal bloating 08/25/2013  . Schizoaffective disorder ([place]) 05/12/2013  . PTSD (post-traumatic stress disorder) 05/12/2013  . Bipolar I disorder, most recent episode (or current) depressed, unspecified 05/12/2013  . Abdominal pain, generalized 05/28/2011  . Esophageal reflux 05/28/2011    Past Surgical History:  Procedure Laterality Date  . BRAVO [place] STUDY  06/02/2011   Procedure: BRAVO [place] STUDY;  Surgeon: Inda Castle, MD;  Location[facility];  Service: Endoscopy;  Laterality: N/A;  . CHOLECYSTECTOMY    . DILATION AND CURETTAGE OF UTERUS    . ESOPHAGOGASTRODUODENOSCOPY  06/02/2011   Procedure: ESOPHAGOGASTRODUODENOSCOPY (EGD);  Surgeon: Inda Castle, MD;  Location: [facility];  Service: Endoscopy;  Laterality: N/A;  . LAPAROSCOPIC CHOLECYSTECTOMY W/ CHOLANGIOGRAPHY  04/25/2011   Dr Margot Chimes  . NASAL POLYP SURGERY     polyp from vocal cords  . WISDOM TOOTH EXTRACTION      Prior to Admission medications   Medication Sig Start Date End Date Taking? Authorizing Provider  albuterol (PROVENTIL HFA;VENTOLIN HFA) 108 (90 Base) MCG/ACT inhaler Inhale 2 puffs into the lungs every 6 (six) hours as needed for wheezing or shortness of breath. 02/28/17   Rudene Re, MD  ferrous sulfate 325 (65 FE) MG tablet Take 325 mg by mouth daily with breakfast.    [provider]  gabapentin (NEURONTIN)  100 MG capsule Take by mouth. 07/26/17 07/26/18  [provider]  Ipratropium-Albuterol (COMBIVENT) 20-100 MCG/ACT AERS respimat Inhale 1 puff into the lungs every 6 (six) hours.    [provider]  montelukast (SINGULAIR) 10 MG tablet Take by mouth. 06/02/17 06/02/18  [provider]  phentermine 37.5 MG capsule Take by mouth. 07/26/17 08/25/17  [provider]  predniSONE (DELTASONE) 10 MG tablet Take 20 mg by mouth daily.    [provider]    Allergies  Allergen Reactions  .  Lisinopril Other (See Comments)    Not shared today    Family History  Problem Relation Age of Onset  . Diabetes Mother   . Pancreatic cancer Mother   . Lung cancer Father   . Cancer Paternal Aunt breast  . Anesthesia problems Neg Hx   . Hypotension Neg Hx   . Malignant hyperthermia Neg Hx   . Pseudochol deficiency Neg Hx     Social History Social History   Tobacco Use  . Smoking status: Never Smoker  . Smokeless tobacco: Never Used  Substance Use Topics  . Alcohol use: Yes    Comment: occ  . Drug use: No    Review of Systems Constitutional: Negative for fever. Cardiovascular: Negative for chest pain. Respiratory: Chronic shortness of breath, no worse per patient. Gastrointestinal: Negative for abdominal pain, vomiting  Musculoskeletal: Right leg pain Skin: Negative for skin complaints  Neurological: Negative for headache All other ROS negative  ____________________________________________   PHYSICAL EXAM:  VITAL SIGNS: ED Triage Vitals  Enc Vitals Group     BP 03/02/19 2306 (!) 144/105     Pulse Rate 03/02/19 2306 79     Resp 03/02/19 2259 19     Temp 03/02/19 2259 98 F (36.7 C)     Temp src --      SpO2 03/02/19 2259 100 %     Weight 03/02/19 2259 270 lb (122.5 kg)     Height 03/02/19 2259 5\' 8"  (1.727 m)     Head Circumference --      Peak Flow --      Pain Score 03/02/19 2259 7     Pain Loc --      Pain Edu? --      Excl. in [place]? --    Constitutional: Alert and oriented. Well appearing and in no distress. Eyes: Normal exam ENT      Head: Normocephalic and atraumatic.      Mouth/Throat: Mucous membranes are moist. Cardiovascular: Normal rate, regular rhythm. No murmur Respiratory: Normal respiratory effort without tachypnea nor retractions. Breath sounds are clear Gastrointestinal: Soft and nontender. No distention. Musculoskeletal: Moderate right calf tenderness, small palpable cord. Neurologic:  Normal speech and language. No gross focal  neurologic deficits Skin:  Skin is warm, dry and intact.  Psychiatric: Mood and affect are normal.   ____________________________________________    EKG  EKG viewed and interpreted by myself shows a normal sinus rhythm at 72 bpm with a narrow QRS, normal axis, normal intervals, no concerning ST changes  ____________________________________________    RADIOLOGY  IMPRESSION:  1. Occlusive thrombus in the posterior tibial vein. No thrombus  proximal to the knee.  2. Superficial thrombophlebitis in the right mid calf.   ____________________________________________   INITIAL IMPRESSION / ASSESSMENT AND PLAN / ED COURSE  Pertinent labs & imaging results that were available during my care of the patient were reviewed by me and considered in my medical decision making (see chart  for details).   Patient presents to the emergency department for right leg pain.  Differential would include superficial thrombophlebitis, DVT, musculoskeletal pain.  Patient's lab work has resulted showing a significant drop in her hemoglobin from 12.2-6.7 over the past 1 year.  When asked further the patient states she has been feeling very fatigued over the past few weeks, has been getting very short of breath with minimal exertion which is atypical for her.  She also states she has been experiencing very dark stool however states that it is fairly typical for her.  Was prescribed iron supplements at a time but stopped taking them several years ago.  Ultrasound positive for DVT.  Patient's rectal examination shows light brown stool, guaiac negative.  Patient does admit to very heavy periods, is not currently on her menstrual cycle.  However given the patient's significant anemia with reported fatigue and dyspnea on exertion we will admit to the hospital service.  Patient denies any chest pain.  We will start on anticoagulation as well.  Duska Brackenridge was evaluated in Emergency Department on 03/03/2019 for the  symptoms described in the history of present illness. She was evaluated in the context of the global COVID-19 pandemic, which necessitated consideration that the patient might be at risk for infection with the SARS-CoV-2 virus that causes COVID-19. Institutional protocols and algorithms that pertain to the evaluation of patients at risk for COVID-19 are in a state of rapid change based on information released by regulatory bodies including the CDC and federal and state organizations. These policies and algorithms were followed during the patient's care in the ED.  ____________________________________________   FINAL CLINICAL IMPRESSION(S) / ED DIAGNOSES  Symptomatic anemia DVT   Harvest Dark, MD 03/03/19 [phone]

## 2019-03-03 NOTE — Progress Notes (Signed)
ANTICOAGULATION CONSULT NOTE - Initial Consult  Pharmacy Consult for Apixaban Indication: DVT  Allergies  Allergen Reactions  . Lisinopril Anaphylaxis    Patient Measurements: Height: 5\' 8"  (172.7 cm) Weight: 270 lb (122.5 kg) IBW/kg (Calculated) : 63.9 Heparin Dosing Weight:    Vital Signs: Temp: 98.2 F (36.8 C) (08/21 1030) Temp Source: Oral (08/21 1030) BP: 111/65 (08/21 1239) Pulse Rate: 70 (08/21 1239)  Labs: Recent Labs    03/02/19 2304 03/03/19 0100  HGB 6.7* 6.6*  HCT 23.9* 23.9*  PLT 402*  --   CREATININE 0.71  --     Estimated Creatinine Clearance: 122.4 mL/min (by C-G formula based on SCr of 0.71 mg/dL).   Medical History: Past Medical History:  Diagnosis Date  . Abdominal pain   . Anemia    hx of anemic  . Asthma   . Bipolar 1 disorder ([place])   . Blood in stool   . Change in voice   . Chest pain   . Constipation   . Depression   . Diverticulitis   . DVT (deep venous thrombosis) ([place]) 12/13/2014  . Dysrhythmia   . Fatigue   . Gallstones 04/10/2011   with biliary pancreatitis  . GERD (gastroesophageal reflux disease)   . Migraines   . MRSA carrier   . Nausea   . Pancreatitis   . PTSD (post-traumatic stress disorder)   . Pulmonary embolism ([place]) 12/13/2014  . Sleep apnea    cpap  . Trouble swallowing     Assessment: 48 y.o.femalewith a past medical history of bipolar, gastric reflux, DVT/PE off of anticoagulation greater than 1 year presents to the emergency department for right leg pain. According to the patient over the past 5 days she has been experiencing progressively worsening pain behind her right calf. Patient states a history of DVTs in the past which this feels similar.  H/H is low. Initially started on Lovenox 1mg /kg SQ q12h, received a dose at 02:00.    Plan:  Will order Apixaban 10mg  PO BID times 7 days followed by 5mg  PO BID. Will follow CBC closely as patient is already anemic.  Paulina Fusi, PharmD,  BCPS 03/03/2019 2:53 PM

## 2019-03-03 NOTE — Progress Notes (Signed)
ANTICOAGULATION CONSULT NOTE - Initial Consult  Pharmacy Consult for Lovenox Indication: VTE Treatment  Allergies  Allergen Reactions  . Lisinopril Anaphylaxis   Patient Measurements: Height: 5\' 8"  (172.7 cm) Weight: 270 lb (122.5 kg) IBW/kg (Calculated) : 63.9  Vital Signs: Temp: 98.3 F (36.8 C) (08/21 0235) Temp Source: Oral (08/21 0235) BP: 114/67 (08/21 0434) Pulse Rate: 69 (08/21 0330)  Labs: Recent Labs    03/02/19 2304 03/03/19 0100  HGB 6.7* 6.6*  HCT 23.9* 23.9*  PLT 402*  --   CREATININE 0.71  --    Estimated Creatinine Clearance: 122.4 mL/min (by C-G formula based on SCr of 0.71 mg/dL).  Medical History: Past Medical History:  Diagnosis Date  . Abdominal pain   . Anemia    hx of anemic  . Asthma   . Bipolar 1 disorder ([place])   . Blood in stool   . Change in voice   . Chest pain   . Constipation   . Depression   . Diverticulitis   . DVT (deep venous thrombosis) ([place]) 12/13/2014  . Dysrhythmia   . Fatigue   . Gallstones 04/10/2011   with biliary pancreatitis  . GERD (gastroesophageal reflux disease)   . Migraines   . MRSA carrier   . Nausea   . Pancreatitis   . PTSD (post-traumatic stress disorder)   . Pulmonary embolism ([place]) 12/13/2014  . Sleep apnea    cpap  . Trouble swallowing    Medications:  (Not in a hospital admission)  Assessment: 48 y.o. female with a past medical history of bipolar, gastric reflux, DVT/PE off of anticoagulation greater than 1 year presents to the emergency department for right leg pain.  According to the patient over the past 5 days she has been experiencing progressively worsening pain behind her right calf.  Patient states a history of DVTs in the past which this feels similar.  H/H is low.  Goal of Therapy:  Anti-Xa level 0.6-1 units/ml 4hrs after LMWH dose given if clinically indicated Monitor platelets by anticoagulation protocol: Yes   Plan:  Lovenox 1mg /Kg SQ q12hrs Monitor CBC, s/sx bleeding  complications  Hart Robinsons A 03/03/2019,5:03 AM

## 2019-03-04 DIAGNOSIS — Z6841 Body Mass Index (BMI) 40.0 and over, adult: Secondary | ICD-10-CM | POA: Diagnosis not present

## 2019-03-04 DIAGNOSIS — Z800 Family history of malignant neoplasm of digestive organs: Secondary | ICD-10-CM | POA: Diagnosis not present

## 2019-03-04 DIAGNOSIS — Z833 Family history of diabetes mellitus: Secondary | ICD-10-CM | POA: Diagnosis not present

## 2019-03-04 DIAGNOSIS — F4310 Post-traumatic stress disorder, unspecified: Secondary | ICD-10-CM | POA: Diagnosis present

## 2019-03-04 DIAGNOSIS — Z86711 Personal history of pulmonary embolism: Secondary | ICD-10-CM | POA: Diagnosis not present

## 2019-03-04 DIAGNOSIS — Z86718 Personal history of other venous thrombosis and embolism: Secondary | ICD-10-CM | POA: Diagnosis not present

## 2019-03-04 DIAGNOSIS — R0602 Shortness of breath: Secondary | ICD-10-CM | POA: Diagnosis present

## 2019-03-04 DIAGNOSIS — J8489 Other specified interstitial pulmonary diseases: Secondary | ICD-10-CM | POA: Diagnosis present

## 2019-03-04 DIAGNOSIS — K219 Gastro-esophageal reflux disease without esophagitis: Secondary | ICD-10-CM | POA: Diagnosis present

## 2019-03-04 DIAGNOSIS — Z888 Allergy status to other drugs, medicaments and biological substances status: Secondary | ICD-10-CM | POA: Diagnosis not present

## 2019-03-04 DIAGNOSIS — G4733 Obstructive sleep apnea (adult) (pediatric): Secondary | ICD-10-CM | POA: Diagnosis present

## 2019-03-04 DIAGNOSIS — G43909 Migraine, unspecified, not intractable, without status migrainosus: Secondary | ICD-10-CM | POA: Diagnosis present

## 2019-03-04 DIAGNOSIS — D509 Iron deficiency anemia, unspecified: Secondary | ICD-10-CM | POA: Diagnosis present

## 2019-03-04 DIAGNOSIS — J45909 Unspecified asthma, uncomplicated: Secondary | ICD-10-CM | POA: Diagnosis present

## 2019-03-04 DIAGNOSIS — I82441 Acute embolism and thrombosis of right tibial vein: Secondary | ICD-10-CM | POA: Diagnosis present

## 2019-03-04 DIAGNOSIS — F319 Bipolar disorder, unspecified: Secondary | ICD-10-CM | POA: Diagnosis present

## 2019-03-04 DIAGNOSIS — N920 Excessive and frequent menstruation with regular cycle: Secondary | ICD-10-CM | POA: Diagnosis present

## 2019-03-04 DIAGNOSIS — Z801 Family history of malignant neoplasm of trachea, bronchus and lung: Secondary | ICD-10-CM | POA: Diagnosis not present

## 2019-03-04 DIAGNOSIS — Z20828 Contact with and (suspected) exposure to other viral communicable diseases: Secondary | ICD-10-CM | POA: Diagnosis present

## 2019-03-04 LAB — ESTRADIOL: Estradiol: 97.3 pg/mL

## 2019-03-04 LAB — BPAM RBC
Blood Product Expiration Date: 202009092359
ISSUE DATE / TIME: 202008210214
Unit Type and Rh: 7300

## 2019-03-04 LAB — CBC
HCT: 26.2 % — ABNORMAL LOW (ref 36.0–46.0)
Hemoglobin: 7.5 g/dL — ABNORMAL LOW (ref 12.0–15.0)
MCH: 18.8 pg — ABNORMAL LOW (ref 26.0–34.0)
MCHC: 28.6 g/dL — ABNORMAL LOW (ref 30.0–36.0)
MCV: 65.7 fL — ABNORMAL LOW (ref 80.0–100.0)
Platelets: 363 10*3/uL (ref 150–400)
RBC: 3.99 MIL/uL (ref 3.87–5.11)
RDW: 23.4 % — ABNORMAL HIGH (ref 11.5–15.5)
WBC: 13 10*3/uL — ABNORMAL HIGH (ref 4.0–10.5)
nRBC: 0.4 % — ABNORMAL HIGH (ref 0.0–0.2)

## 2019-03-04 LAB — TYPE AND SCREEN
ABO/RH(D): B POS
Antibody Screen: NEGATIVE
Unit division: 0

## 2019-03-04 LAB — FSH/LH
FSH: 4.7 m[IU]/mL
LH: 4.9 m[IU]/mL

## 2019-03-04 LAB — PROLACTIN: Prolactin: 24.2 ng/mL — ABNORMAL HIGH (ref 4.8–23.3)

## 2019-03-04 NOTE — Progress Notes (Signed)
[facility] [place] NAME: Lisa [name]    MR#:  [id number]  DATE OF BIRTH:  01/25/1971  SUBJECTIVE:  CHIEF COMPLAINT:   Chief Complaint  Patient presents with   Leg Pain   Shortness of Breath   -Patient has been having symptomatic anemia complaints with weakness, fatigue -Also presents with right calf pain and noted to have DVT.  REVIEW OF SYSTEMS:  Review of Systems  Constitutional: Positive for malaise/fatigue. Negative for chills and fever.  HENT: Negative for congestion, hearing loss, nosebleeds and sinus pain.   Eyes: Negative for blurred vision and double vision.  Respiratory: Negative for cough, shortness of breath and wheezing.   Cardiovascular: Negative for chest pain and palpitations.  Gastrointestinal: Negative for abdominal pain, constipation, diarrhea, nausea and vomiting.  Genitourinary: Negative for dysuria.  Musculoskeletal: Positive for myalgias.  Neurological: Positive for weakness. Negative for dizziness, focal weakness, seizures and headaches.  Psychiatric/Behavioral: Negative for depression.    DRUG ALLERGIES:   Allergies  Allergen Reactions   Lisinopril Anaphylaxis    VITALS:  Blood pressure 129/81, pulse 64, temperature 98.1 F (36.7 C), temperature source Oral, resp. rate 16, height 5\' 8"  (1.727 m), weight 124 kg, SpO2 98 %.  PHYSICAL EXAMINATION:  Physical Exam   GENERAL:  48 y.o.-year-old patient lying in the bed with no acute distress.  EYES: Pupils equal, round, reactive to light and accommodation. No scleral icterus. Extraocular muscles intact.  HEENT: Head atraumatic, normocephalic. Oropharynx and nasopharynx clear.  NECK:  Supple, no jugular venous distention. No thyroid enlargement, no tenderness.  LUNGS: Normal breath sounds bilaterally, no wheezing, rales,rhonchi or crepitation. No use of accessory muscles of respiration.  Decreased bibasilar breath sounds CARDIOVASCULAR: S1, S2  normal. No murmurs, rubs, or gallops.  ABDOMEN: Soft, nontender, nondistended. Bowel sounds present. No organomegaly or mass.  EXTREMITIES: No pedal edema, cyanosis, or clubbing.  Tenderness of the right calf with palpable superficial vein cord NEUROLOGIC: Cranial nerves II through XII are intact. Muscle strength 5/5 in all extremities. Sensation intact. Gait not checked.  PSYCHIATRIC: The patient is alert and oriented x 3.  SKIN: No obvious rash, lesion, or ulcer.    LABORATORY PANEL:   CBC Recent Labs  Lab 03/04/19 0627  WBC 13.0*  HGB 7.5*  HCT 26.2*  PLT 363   ------------------------------------------------------------------------------------------------------------------  Chemistries  Recent Labs  Lab 03/02/19 2304 03/03/19 2051  NA 136 139  K 3.8 4.0  CL 107 107  CO2 24 23  GLUCOSE 102* 107*  BUN 8 9  CREATININE 0.71 0.73  CALCIUM 8.9 9.3  AST 15  --   ALT 12  --   ALKPHOS 42  --   BILITOT 0.4  --    ------------------------------------------------------------------------------------------------------------------  Cardiac Enzymes No results for input(s): TROPONINI in the last 168 hours. ------------------------------------------------------------------------------------------------------------------  RADIOLOGY:  Dg Chest 1 View  Result Date: 03/03/2019 CLINICAL DATA:  Shortness of breath. Right leg pain. EXAM: CHEST  1 VIEW COMPARISON:  CT 05/19/2017, radiograph 02/27/2017 FINDINGS: Unchanged heart size and mediastinal contours. Multifocal regions of scarring and architectural distortion are not significantly changed from prior imaging. No acute airspace disease. No pleural fluid or pneumothorax. Unchanged osseous structures. IMPRESSION: Multifocal regions of scarring are not significantly changed from prior imaging consistent with interstitial lung disease. No superimposed acute abnormality. Electronically Signed   By: Keith Rake M.D.   On: 03/03/2019  01:05   US Venous Img Lower Unilateral Right  Result Date:  03/03/2019 CLINICAL DATA:  Right leg pain for 5 days. EXAM: RIGHT LOWER EXTREMITY VENOUS DOPPLER ULTRASOUND TECHNIQUE: Gray-scale sonography with graded compression, as well as color Doppler and duplex ultrasound were performed to evaluate the lower extremity deep venous systems from the level of the common femoral vein and including the common femoral, femoral, profunda femoral, popliteal and calf veins including the posterior tibial, peroneal and gastrocnemius veins when visible. The superficial great saphenous vein was also interrogated. Spectral Doppler was utilized to evaluate flow at rest and with distal augmentation maneuvers in the common femoral, femoral and popliteal veins. COMPARISON:  None. FINDINGS: Contralateral Common Femoral Vein: Respiratory phasicity is normal and symmetric with the symptomatic side. No evidence of thrombus. Normal compressibility. Common Femoral Vein: No evidence of thrombus. Normal compressibility, respiratory phasicity and response to augmentation. Saphenofemoral Junction: No evidence of thrombus. Normal compressibility and flow on color Doppler imaging. Profunda Femoral Vein: No evidence of thrombus. Normal compressibility and flow on color Doppler imaging. Femoral Vein: No evidence of thrombus. Normal compressibility, respiratory phasicity and response to augmentation. Popliteal Vein: No evidence of thrombus. Normal compressibility, respiratory phasicity and response to augmentation. Calf Veins: Occlusive thrombus in the posterior tibial vein with lack of compressibility. No evidence of thrombus in the peroneal vein. Superficial Great Saphenous Vein: No evidence of thrombus. Normal compressibility. Venous Reflux:  None. Other Findings: Superficial thrombus in the right mid calf in the area of patient pain. IMPRESSION: 1. Occlusive thrombus in the posterior tibial vein. No thrombus proximal to the knee. 2.  Superficial thrombophlebitis in the right mid calf. These results were called by telephone at the time of interpretation on 03/03/2019 at 12:55 am to Dr. Harvest Dark , who verbally acknowledged these results. Electronically Signed   By: Keith Rake M.D.   On: 03/03/2019 00:55    EKG:   Orders placed or performed during the hospital encounter of 03/03/19   ED EKG   ED EKG    ASSESSMENT AND PLAN:   48 year old female with past medical history significant for bipolar,NSIP, sleep apnea, history of DVT treated with anticoagulation in the past presents to hospital secondary to worsening fatigue and right calf pain.  1.  Right leg DVT-occlusive thrombus in posterior tibial vein, no clot proximal to the knee noted. 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  Also hypercoagulation work-up to be done as outpatient at cancer center -Right mid calf superficial thrombophlebitis noted -Appreciate vascular consult. -Started on oral Eliquis  -Monitor closely due to anemia  2.  Acute on chronic iron deficiency anemia-received 1 units of blood transfusion on admission yesterday. -started on IV iron. 2nd dose today -Has significant blood loss with dysmenorrhea.  Appreciate GYN consult -Trial of Provera now -GI follow-up as outpatient  3.  NSIP-follows with pulmonary as outpatient -Breathing is stable.  Continue azathioprine, prednisone and Combivent  4.  Bipolar/PTSD- monitor, stable  5.  DVT prophylaxis-patient already on Eliquis  6. OSA- started CPAP    All the records are reviewed and case discussed with Care Management/Social Workerr. Management plans discussed with the patient, family and they are in agreement.  CODE STATUS: Full Code  TOTAL TIME TAKING CARE OF THIS PATIENT: 33  minutes.   POSSIBLE D/C TOMORROW, DEPENDING ON CLINICAL CONDITION.   Gladstone Lighter M.D on 03/04/2019  at 2:15 PM  Between 7am to 6pm - Pager - [phone]  After 6pm go to [email or web address] -  password [email or web address] [facility]  Office  [phone]  CC: Primary care physician; No primary care provider on file.

## 2019-03-05 LAB — TESTOSTERONE,FREE AND TOTAL
Testosterone, Free: 1.1 pg/mL (ref 0.0–4.2)
Testosterone: 13 ng/dL (ref 8–48)

## 2019-03-05 LAB — CBC
HCT: 26.1 % — ABNORMAL LOW (ref 36.0–46.0)
Hemoglobin: 7.4 g/dL — ABNORMAL LOW (ref 12.0–15.0)
MCH: 19 pg — ABNORMAL LOW (ref 26.0–34.0)
MCHC: 28.4 g/dL — ABNORMAL LOW (ref 30.0–36.0)
MCV: 66.9 fL — ABNORMAL LOW (ref 80.0–100.0)
Platelets: 362 10*3/uL (ref 150–400)
RBC: 3.9 MIL/uL (ref 3.87–5.11)
RDW: 23.9 % — ABNORMAL HIGH (ref 11.5–15.5)
WBC: 15.3 10*3/uL — ABNORMAL HIGH (ref 4.0–10.5)
nRBC: 0.9 % — ABNORMAL HIGH (ref 0.0–0.2)

## 2019-03-05 MED ORDER — POLYETHYLENE GLYCOL 3350 17 G PO PACK
17.0000 g | PACK | Freq: Every day | ORAL | Status: DC
  Administered 2019-03-05: 11:00:00 17 g via ORAL
  Filled 2019-03-05: qty 1

## 2019-03-05 MED ORDER — SODIUM CHLORIDE 0.9 % IV SOLN
200.0000 mg | INTRAVENOUS | Status: AC
  Administered 2019-03-05: 11:00:00 200 mg via INTRAVENOUS
  Filled 2019-03-05: qty 10

## 2019-03-05 MED ORDER — BISACODYL 5 MG PO TBEC
10.0000 mg | DELAYED_RELEASE_TABLET | Freq: Every day | ORAL | Status: DC
  Administered 2019-03-05: 10 mg via ORAL
  Filled 2019-03-05: qty 2

## 2019-03-05 MED ORDER — IPRATROPIUM-ALBUTEROL 0.5-2.5 (3) MG/3ML IN SOLN: 3.0000 mL | Freq: Four times a day (QID) | RESPIRATORY_TRACT | Status: DC | PRN

## 2019-03-05 MED ORDER — POLYSACCHARIDE IRON COMPLEX 15 MG/ML PO LIQD: 5.0000 mL | Freq: Two times a day (BID) | ORAL | 2 refills | Status: DC

## 2019-03-05 MED ORDER — ELIQUIS 5 MG VTE STARTER PACK: ORAL_TABLET | ORAL | 2 refills | Status: DC

## 2019-03-05 MED ORDER — MEDROXYPROGESTERONE ACETATE 10 MG PO TABS: 20.0000 mg | ORAL_TABLET | Freq: Every day | ORAL | 0 refills | Status: DC

## 2019-03-05 NOTE — Plan of Care (Signed)

## 2019-03-05 NOTE — Discharge Instructions (Signed)
Anemia  Anemia is a condition in which you do not have enough red blood cells or hemoglobin. Hemoglobin is a substance in red blood cells that carries oxygen. When you do not have enough red blood cells or hemoglobin (are anemic), your body cannot get enough oxygen and your organs may not work properly. As a result, you may feel very tired or have other problems. What are the causes? Common causes of anemia include:  Excessive bleeding. Anemia can be caused by excessive bleeding inside or outside the body, including bleeding from the intestine or from periods in women.  Poor nutrition.  Long-lasting (chronic) kidney, thyroid, and liver disease.  Bone marrow disorders.  Cancer and treatments for cancer.  HIV (human immunodeficiency virus) and AIDS (acquired immunodeficiency syndrome).  Treatments for HIV and AIDS.  Spleen problems.  Blood disorders.  Infections, medicines, and autoimmune disorders that destroy red blood cells. What are the signs or symptoms? Symptoms of this condition include:  Minor weakness.  Dizziness.  Headache.  Feeling heartbeats that are irregular or faster than normal (palpitations).  Shortness of breath, especially with exercise.  Paleness.  Cold sensitivity.  Indigestion.  Nausea.  Difficulty sleeping.  Difficulty concentrating. Symptoms may occur suddenly or develop slowly. If your anemia is mild, you may not have symptoms. How is this diagnosed? This condition is diagnosed based on:  Blood tests.  Your medical history.  A physical exam.  Bone marrow biopsy. Your health care provider may also check your stool (feces) for blood and may do additional testing to look for the cause of your bleeding. You may also have other tests, including:  Imaging tests, such as a CT scan or MRI.  Endoscopy.  Colonoscopy. How is this treated? Treatment for this condition depends on the cause. If you continue to lose a lot of blood, you may  need to be treated at a hospital. Treatment may include:  Taking supplements of iron, vitamin M08, or folic acid.  Taking a hormone medicine (erythropoietin) that can help to stimulate red blood cell growth.  Having a blood transfusion. This may be needed if you lose a lot of blood.  Making changes to your diet.  Having surgery to remove your spleen. Follow these instructions at home:  Take over-the-counter and prescription medicines only as told by your health care provider.  Take supplements only as told by your health care provider.  Follow any diet instructions that you were given.  Keep all follow-up visits as told by your health care provider. This is important. Contact a health care provider if:  You develop new bleeding anywhere in the body. Get help right away if:  You are very weak.  You are short of breath.  You have pain in your abdomen or chest.  You are dizzy or feel faint.  You have trouble concentrating.  You have bloody or black, tarry stools.  You vomit repeatedly or you vomit up blood. Summary  Anemia is a condition in which you do not have enough red blood cells or enough of a substance in your red blood cells that carries oxygen (hemoglobin).  Symptoms may occur suddenly or develop slowly.  If your anemia is mild, you may not have symptoms.  This condition is diagnosed with blood tests as well as a medical history and physical exam. Other tests may be needed.  Treatment for this condition depends on the cause of the anemia. This information is not intended to replace advice given to you by  your health care provider. Make sure you discuss any questions you have with your health care provider. °Document Released: 08/06/2004 Document Revised: 06/11/2017 Document Reviewed: 07/31/2016 °Elsevier Patient Education © 2020 Elsevier Inc. ° °

## 2019-03-05 NOTE — Discharge Summary (Signed)
[facility] [place] NAME: Lisa [name]    MR#:  [id number]  DATE OF BIRTH:  20-Feb-1971  DATE OF ADMISSION:  03/03/2019   ADMITTING PHYSICIAN: Harrie Foreman, MD  DATE OF DISCHARGE:  03/05/19  PRIMARY CARE PHYSICIAN: No primary care provider on file.   ADMISSION DIAGNOSIS:   SOB (shortness of breath) [R06.02] Acute deep vein thrombosis (DVT) of tibial vein of right lower extremity (HCC) [I82.441] Symptomatic anemia [D64.9]  DISCHARGE DIAGNOSIS:   Active Problems:   Symptomatic anemia   SECONDARY DIAGNOSIS:   Past Medical History:  Diagnosis Date  . Abdominal pain   . Anemia    hx of anemic  . Asthma   . Bipolar 1 disorder ([place])   . Blood in stool   . Change in voice   . Chest pain   . Constipation   . Depression   . Diverticulitis   . DVT (deep venous thrombosis) ([place]) 12/13/2014  . Dysrhythmia   . Fatigue   . Gallstones 04/10/2011   with biliary pancreatitis  . GERD (gastroesophageal reflux disease)   . Migraines   . MRSA carrier   . Nausea   . Pancreatitis   . PTSD (post-traumatic stress disorder)   . Pulmonary embolism ([place]) 12/13/2014  . Sleep apnea    cpap  . Trouble swallowing     HOSPITAL COURSE:   48 year old female with past medical history significant for bipolar,NSIP, sleep apnea, history of DVT treated with anticoagulation in the past presents to hospital secondary to worsening fatigue and right calf pain.  1.  Right leg DVT-occlusive thrombus in posterior tibial vein, no clot proximal to the knee noted. 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  Also hypercoagulation work-up to be done as outpatient at cancer center -Right mid calf superficial thrombophlebitis noted -Appreciate vascular consult. -Started on oral Eliquis treatment dose -Monitor closely due to anemia  2.  Acute on chronic iron  deficiency anemia-received 1 units of blood transfusion on admission. -started on IV iron.  Received 3 doses in the hospital. -On oral iron at discharge twice daily. -Has significant blood loss with dysmenorrhea.  Appreciate GYN consult -Trial of Provera for now-outpatient follow-up with Gyn recommended -GI follow-up as outpatient  3.  NSIP-follows with pulmonary as outpatient -Breathing is stable.  Continue azathioprine, prednisone and Combivent  4.  Bipolar/PTSD- monitor, stable  5. OSA-has outpatient sleep study scheduled as outpatient  6.  Constipation-Meds ordered  Patient is stable and independent.  Will be discharged home today    DISCHARGE CONDITIONS:   Guarded  CONSULTS OBTAINED:   Obgyn consult Vascular consult  DRUG ALLERGIES:   Allergies  Allergen Reactions  . Lisinopril Anaphylaxis   DISCHARGE MEDICATIONS:   Allergies as of 03/05/2019      Reactions   Lisinopril Anaphylaxis      Medication List    TAKE these medications   ACIDOPHILUS/BIFIDUS PO Take 1 capsule by mouth daily. Notes to patient: Start Monday   Aimovig 140 MG/ML Soaj Generic drug: Erenumab-aooe Inject 140 mg into the skin every 30 (thirty) days. Notes to patient: Per your schedule   albuterol 108 (90 Base) MCG/ACT inhaler Commonly known as: VENTOLIN HFA Inhale 2 puffs into the lungs every 6 (six) hours as needed for wheezing or shortness of breath.   azaTHIOprine 50 MG tablet Commonly known as: IMURAN Take 50 mg  by mouth daily. Notes to patient: Start Monday morning   Chromium 500 MCG Tabs Take 500 mcg by mouth daily. Notes to patient: Start Monday morning   Eliquis DVT/PE Starter Pack 5 MG Tabs Take as directed on package: start with two-5mg  tablets twice daily for 7 days. On day 8, switch to one-5mg  tablet twice daily. Notes to patient: Next dose this evening.    Ipratropium-Albuterol 20-100 MCG/ACT Aers respimat Commonly known as: COMBIVENT Inhale 1 puff into the  lungs every 6 (six) hours.   medroxyPROGESTERone 10 MG tablet Commonly known as: PROVERA Take 2 tablets (20 mg total) by mouth daily for 20 days. Start taking on: March 06, 2019 Notes to patient: Start Monday morning   omeprazole 40 MG capsule Commonly known as: PRILOSEC Take 40 mg by mouth daily. Notes to patient: Due Monday   Polysaccharide Iron Complex 15 MG/ML Liqd Take 5 mLs by mouth 2 (two) times daily. Notes to patient: This evening   predniSONE 10 MG tablet Commonly known as: DELTASONE Take 20 mg by mouth daily. Notes to patient: Monday morning with breakfast.    propranolol 20 MG tablet Commonly known as: INDERAL Take 20 mg by mouth 2 (two) times daily. Notes to patient: Next dose tonight   SUMAtriptan 100 MG tablet Commonly known as: IMITREX TAKE 1 TABLET BY MOUTH ONCE DAILY AS NEEDED FOR MIGRAINE FOR UP TO 1 DOSE MAY TAKE A SECOND DOSE AFTER 2 HOURS IF NEEDED        DISCHARGE INSTRUCTIONS:   1.  PCP follow-up in 1 to 2 weeks 2.  Pulmonary follow-up as per schedule 3.  Vascular follow-up in 2 to 3 weeks 4.  OB/GYN follow-up in 2 to 3 weeks  DIET:   Cardiac diet  ACTIVITY:   Activity as tolerated  OXYGEN:   Home Oxygen: No.  Oxygen Delivery: room air  DISCHARGE LOCATION:   home   If you experience worsening of your admission symptoms, develop shortness of breath, life threatening emergency, suicidal or homicidal thoughts you must seek medical attention immediately by calling 911 or calling your MD immediately  if symptoms less severe.  You Must read complete instructions/literature along with all the possible adverse reactions/side effects for all the Medicines you take and that have been prescribed to you. Take any new Medicines after you have completely understood and accpet all the possible adverse reactions/side effects.   Please note  You were cared for by a hospitalist during your hospital stay. If you have any questions about your  discharge medications or the care you received while you were in the hospital after you are discharged, you can call the unit and asked to speak with the hospitalist on call if the hospitalist that took care of you is not available. Once you are discharged, your primary care physician will handle any further medical issues. Please note that NO REFILLS for any discharge medications will be authorized once you are discharged, as it is imperative that you return to your primary care physician (or establish a relationship with a primary care physician if you do not have one) for your aftercare needs so that they can reassess your need for medications and monitor your lab values.    On the day of Discharge:  VITAL SIGNS:   Blood pressure 111/64, pulse 64, temperature 98.2 F (36.8 C), temperature source Oral, resp. rate 20, height 5\' 8"  (1.727 m), weight 124.6 kg, SpO2 98 %.  PHYSICAL EXAMINATION:     GENERAL:  48 y.o.-year-old patient lying in the bed with no acute distress.  EYES: Pupils equal, round, reactive to light and accommodation. No scleral icterus. Extraocular muscles intact.  HEENT: Head atraumatic, normocephalic. Oropharynx and nasopharynx clear.  NECK:  Supple, no jugular venous distention. No thyroid enlargement, no tenderness.  LUNGS: Normal breath sounds bilaterally, no wheezing, rales,rhonchi or crepitation. No use of accessory muscles of respiration.  Decreased bibasilar breath sounds CARDIOVASCULAR: S1, S2 normal. No murmurs, rubs, or gallops.  ABDOMEN: Soft, nontender, nondistended. Bowel sounds present. No organomegaly or mass.  EXTREMITIES: No pedal edema, cyanosis, or clubbing.  Tenderness of the right calf with palpable superficial vein cord NEUROLOGIC: Cranial nerves II through XII are intact. Muscle strength 5/5 in all extremities. Sensation intact. Gait not checked.  PSYCHIATRIC: The patient is alert and oriented x 3.  SKIN: No obvious rash, lesion, or ulcer.   DATA  REVIEW:   CBC Recent Labs  Lab 03/05/19 0548  WBC 15.3*  HGB 7.4*  HCT 26.1*  PLT 362    Chemistries  Recent Labs  Lab 03/02/19 2304 03/03/19 2051  NA 136 139  K 3.8 4.0  CL 107 107  CO2 24 23  GLUCOSE 102* 107*  BUN 8 9  CREATININE 0.71 0.73  CALCIUM 8.9 9.3  AST 15  --   ALT 12  --   ALKPHOS 42  --   BILITOT 0.4  --      Microbiology Results  Results for orders placed or performed during the hospital encounter of 03/03/19  SARS CORONAVIRUS 2     Status: None   Collection Time: 03/03/19  7:43 AM  Result Value Ref Range Status   SARS Coronavirus 2 NEGATIVE NEGATIVE Final    Comment: (NOTE) SARS-CoV-2 target nucleic acids are NOT DETECTED. The SARS-CoV-2 RNA is generally detectable in upper and lower respiratory specimens during the acute phase of infection. Negative results do not preclude SARS-CoV-2 infection, do not rule out co-infections with other pathogens, and should not be used as the sole basis for treatment or other patient management decisions. Negative results must be combined with clinical observations, patient history, and epidemiological information. The expected result is Negative. Fact Sheet for Patients: SugarRoll.be Fact Sheet for Healthcare Providers: https://www.woods-mathews.com/ This test is not yet approved or cleared by the [place] FDA and  has been authorized for detection and/or diagnosis of SARS-CoV-2 by FDA under an Emergency Use Authorization (EUA). This EUA will remain  in effect (meaning this test can be used) for the duration of the COVID-19 declaration under Section 56 4(b)(1) of the Act, 21 U.S.C. section 360bbb-3(b)(1), unless the authorization is terminated or revoked sooner. Performed at [facility], [place] [address], [place], [place] [address]     RADIOLOGY:  No results found.   Management plans discussed with the patient, family and they are in agreement.   CODE STATUS:     Code Status Orders  (From admission, onward)         Start     Ordered   03/03/19 0516  Full code  Continuous     03/03/19 0515        Code Status History    Date Active Date Inactive Code Status Order ID Comments User Context   05/10/2013 2356 05/11/2013 1948 Full Code [id number]  [facility]   Advance Care Planning Activity      TOTAL TIME TAKING CARE OF THIS PATIENT: 38 minutes.    Gladstone Lighter  M.D on 03/05/2019 at 12:59 PM  Between 7am to 6pm - Pager - [phone]  After 6pm go to [email or web address] - [employer] [facility]  Office  [phone]  CC: Primary care physician; No primary care provider on file.   Note: This dictation was prepared with Dragon dictation along with smaller phrase technology. Any transcriptional errors that result from this process are unintentional.

## 2019-03-05 NOTE — Progress Notes (Signed)
Transported in transport chair to private vehicle; discharge home to self/family care.

## 2019-03-05 NOTE — Progress Notes (Signed)
Reports feeling better; denies any active bleeding. Iron infusion completed. Miralax and biscodyl tabs given for constipation with pt to followup at home to resolve constipation with necessary laxatives and repeated back. Oral and written AVS instructions given. Pt will be discharged home to self/family care.

## 2019-03-06 LAB — NOVEL CORONAVIRUS, NAA (HOSP ORDER, SEND-OUT TO REF LAB; TAT 18-24 HRS)

## 2019-03-06 NOTE — Telephone Encounter (Signed)
Left generic message to call back to confirm schedule appointment

## 2019-03-08 ENCOUNTER — Encounter: Payer: Self-pay | Admitting: Obstetrics & Gynecology

## 2019-03-08 ENCOUNTER — Other Ambulatory Visit: Payer: Self-pay

## 2019-03-13 ENCOUNTER — Ambulatory Visit
Admission: RE | Admit: 2019-03-13 | Discharge: 2019-03-13 | Disposition: A | Discharge status: Home / Self Care | Payer: Medicare Other | Source: Ambulatory Visit | Attending: Specialist | Admitting: Specialist

## 2019-03-13 ENCOUNTER — Other Ambulatory Visit: Payer: Self-pay | Admitting: Specialist

## 2019-03-13 ENCOUNTER — Other Ambulatory Visit: Payer: Self-pay

## 2019-03-13 DIAGNOSIS — J849 Interstitial pulmonary disease, unspecified: Secondary | ICD-10-CM

## 2019-03-14 ENCOUNTER — Ambulatory Visit (INDEPENDENT_AMBULATORY_CARE_PROVIDER_SITE_OTHER): Payer: Medicare Other | Admitting: Obstetrics and Gynecology

## 2019-03-14 ENCOUNTER — Ambulatory Visit (INDEPENDENT_AMBULATORY_CARE_PROVIDER_SITE_OTHER): Payer: Medicare Other

## 2019-03-14 ENCOUNTER — Other Ambulatory Visit (HOSPITAL_COMMUNITY)
Admission: RE | Admit: 2019-03-14 | Discharge: 2019-03-14 | Disposition: A | Discharge status: Home / Self Care | Payer: Medicare Other | Source: Ambulatory Visit | Attending: Obstetrics & Gynecology | Admitting: Obstetrics & Gynecology

## 2019-03-14 ENCOUNTER — Other Ambulatory Visit (HOSPITAL_COMMUNITY)
Admission: RE | Admit: 2019-03-14 | Discharge: 2019-03-14 | Disposition: A | Discharge status: Home / Self Care | Payer: Medicare Other | Source: Ambulatory Visit | Attending: Obstetrics and Gynecology | Admitting: Obstetrics and Gynecology

## 2019-03-14 ENCOUNTER — Encounter: Payer: Self-pay | Admitting: Obstetrics and Gynecology

## 2019-03-14 ENCOUNTER — Other Ambulatory Visit: Payer: Self-pay

## 2019-03-14 VITALS — BP 122/68 | Ht 68.0 in | Wt 273.0 lb

## 2019-03-14 DIAGNOSIS — N939 Abnormal uterine and vaginal bleeding, unspecified: Secondary | ICD-10-CM | POA: Diagnosis present

## 2019-03-14 DIAGNOSIS — Z124 Encounter for screening for malignant neoplasm of cervix: Secondary | ICD-10-CM

## 2019-03-14 DIAGNOSIS — N83202 Unspecified ovarian cyst, left side: Secondary | ICD-10-CM

## 2019-03-14 DIAGNOSIS — R9389 Abnormal findings on diagnostic imaging of other specified body structures: Secondary | ICD-10-CM

## 2019-03-14 NOTE — Progress Notes (Signed)
Gynecology Ultrasound Follow Up  Chief Complaint:  Chief Complaint  Patient presents with   Er follow up    GYN ultrasound   Menorrhagia    Cramping x 1 year     History of Present Illness: Patient is a 48 y.o. female who presents today for ultrasound evaluation of abnormal uterine bleeding and anemia .  Ultrasound demonstrates the following findgins Adnexa: no masses seen  Uterus: Non-enlarged, without evidence of fibroids with endometrial stripe  20.0 mm Additional: no free fluid  Review of Systems: Review of Systems  Constitutional: Negative.   Genitourinary: Negative.   Endo/Heme/Allergies: Does not bruise/bleed easily.    Past Medical History:  Past Medical History:  Diagnosis Date   Abdominal pain    Anemia    hx of anemic   Asthma    Bipolar 1 disorder ([place])    Blood in stool    Change in voice    Chest pain    Constipation    Depression    Diverticulitis    DVT (deep venous thrombosis) ([place]) 12/13/2014   Dysrhythmia    Fatigue    Gallstones 04/10/2011   with biliary pancreatitis   GERD (gastroesophageal reflux disease)    Migraines    MRSA carrier    Nausea    Pancreatitis    PTSD (post-traumatic stress disorder)    Pulmonary embolism ([place]) 12/13/2014   Sleep apnea    cpap   Trouble swallowing     Past Surgical History:  Past Surgical History:  Procedure Laterality Date   BRAVO [facility] STUDY  06/02/2011   Procedure: BRAVO [place] STUDY;  Surgeon: Inda Castle, MD;  Location: [facility] ENDOSCOPY;  Service: Endoscopy;  Laterality: N/A;   CHOLECYSTECTOMY     DILATION AND CURETTAGE OF UTERUS     ESOPHAGOGASTRODUODENOSCOPY  06/02/2011   Procedure: ESOPHAGOGASTRODUODENOSCOPY (EGD);  Surgeon: Inda Castle, MD;  Location: [facility];  Service: Endoscopy;  Laterality: N/A;   LAPAROSCOPIC CHOLECYSTECTOMY W/ CHOLANGIOGRAPHY  04/25/2011   Dr Margot Chimes   NASAL POLYP SURGERY     polyp from vocal cords   WISDOM TOOTH EXTRACTION       Gynecologic History:  Patient's last menstrual period was 02/14/2019 (exact date). Contraception: oral progesterone-only contraceptive  Family History:  Family History  Problem Relation Age of Onset   Diabetes Mother    Pancreatic cancer Mother    Lung cancer Father    Cancer Paternal Aunt breast   Anesthesia problems Neg Hx    Hypotension Neg Hx    Malignant hyperthermia Neg Hx    Pseudochol deficiency Neg Hx     Social History:  Social History   Socioeconomic History   Marital status: Single    Spouse name: Not on file   Number of children: 7   Years of education: Not on file   Highest education level: Not on file  Occupational History   Occupation: Designer, fashion/clothing: Database administrator strain: Not on file   Food insecurity    Worry: Not on file    Inability: Not on file   Transportation needs    Medical: Not on file    Non-medical: Not on file  Tobacco Use   Smoking status: Never Smoker   Smokeless tobacco: Never Used  Substance and Sexual Activity   Alcohol use: Yes    Comment: occ   Drug use: No   Sexual activity: Yes  Birth control/protection: Injection  Lifestyle   Physical activity    Days per week: Not on file    Minutes per session: Not on file   Stress: Not on file  Relationships   Social connections    Talks on phone: Not on file    Gets together: Not on file    Attends religious service: Not on file    Active member of club or organization: Not on file    Attends meetings of clubs or organizations: Not on file    Relationship status: Not on file   Intimate partner violence    Fear of current or ex partner: Not on file    Emotionally abused: Not on file    Physically abused: Not on file    Forced sexual activity: Not on file  Other Topics Concern   Not on file  Social History Narrative   Not on file    Allergies:  Allergies  Allergen Reactions    Lisinopril Anaphylaxis    Medications: Prior to Admission medications   Medication Sig Start Date End Date Taking? Authorizing Provider  AIMOVIG 140 MG/ML SOAJ Inject 140 mg into the skin every 30 (thirty) days. 02/19/19  Yes [provider]  albuterol (PROVENTIL HFA;VENTOLIN HFA) 108 (90 Base) MCG/ACT inhaler Inhale 2 puffs into the lungs every 6 (six) hours as needed for wheezing or shortness of breath. 02/28/17  Yes Alfred Levins, [place], MD  azaTHIOprine (IMURAN) 50 MG tablet Take 50 mg by mouth daily. 02/02/19  Yes [provider]  Chromium 500 MCG TABS Take 500 mcg by mouth daily.   Yes [provider]  Eliquis DVT/PE Starter Pack (ELIQUIS STARTER PACK) 5 MG TABS Take as directed on package: start with two-5mg  tablets twice daily for 7 days. On day 8, switch to one-5mg  tablet twice daily. 03/05/19  Yes Gladstone Lighter, MD  Ipratropium-Albuterol (COMBIVENT) 20-100 MCG/ACT AERS respimat Inhale 1 puff into the lungs every 6 (six) hours.   Yes [provider]  medroxyPROGESTERone (PROVERA) 10 MG tablet Take 2 tablets (20 mg total) by mouth daily for 20 days. 03/06/19 03/26/19 Yes Gladstone Lighter, MD  omeprazole (PRILOSEC) 40 MG capsule Take 40 mg by mouth daily. 01/03/19  Yes [provider]  Polysaccharide Iron Complex 15 MG/ML LIQD Take 5 mLs by mouth 2 (two) times daily. 03/05/19  Yes Gladstone Lighter, MD  predniSONE (DELTASONE) 10 MG tablet Take 20 mg by mouth daily.   Yes [provider]  Probiotic Product (ACIDOPHILUS/BIFIDUS PO) Take 1 capsule by mouth daily.   Yes [provider]  propranolol (INDERAL) 20 MG tablet Take 20 mg by mouth 2 (two) times daily. 01/03/19 01/03/20 Yes [provider]  SUMAtriptan (IMITREX) 100 MG tablet TAKE 1 TABLET BY MOUTH ONCE DAILY AS NEEDED FOR MIGRAINE FOR UP TO 1 DOSE MAY TAKE A SECOND DOSE AFTER 2 HOURS IF NEEDED 02/10/19  Yes [provider]    Physical Exam Vitals: Blood  pressure 122/68, height 5\' 8"  (1.727 m), weight 273 lb (123.8 kg), last menstrual period 02/14/2019.  General: NAD HEENT: normocephalic, anicteric Pulmonary: No increased work of breathing Extremities: no edema, erythema, or tenderness Neurologic: Grossly intact, normal gait Psychiatric: mood appropriate, affect full  ENDOMETRIAL BIOPSY     The indications for endometrial biopsy were reviewed.   Risks of the biopsy including cramping, bleeding, infection, uterine perforation, inadequate specimen and need for additional procedures  were discussed. The patient states she understands and agrees to undergo procedure today.  Consent was signed. Time out was performed. Urine HCG was negative. A Graves speculum was placed and the cervix was brought into view.  The cervix was prepped with Betadine. A single-toothed tenaculum was not placed on the anterior lip of the cervix for traction. A 3 mm pipelle was introduced through the cervix into the endometrial cavity without difficulty to a depth of 9/5cm, and a moderate amount of tissue was obtained, the resulting specime sent to pathology. The instruments were removed from the patient's vagina. Minimal bleeding from the cervix was noted. The patient tolerated the procedure well. Routine post-procedure instructions were given to the patient.  She will be contacted by phone one results become available.     Malachy Mood, MD, FACOG [facility], [facility]  CPT [phone]   Dg Chest 1 View  Result Date: 03/03/2019 CLINICAL DATA:  Shortness of breath. Right leg pain. EXAM: CHEST  1 VIEW COMPARISON:  CT 05/19/2017, radiograph 02/27/2017 FINDINGS: Unchanged heart size and mediastinal contours. Multifocal regions of scarring and architectural distortion are not significantly changed from prior imaging. No acute airspace disease. No pleural fluid or pneumothorax. Unchanged osseous structures. IMPRESSION: Multifocal regions of scarring are not  significantly changed from prior imaging consistent with interstitial lung disease. No superimposed acute abnormality. Electronically Signed   By: Keith Rake M.D.   On: 03/03/2019 01:05   US Transvaginal Non-ob  Result Date: 03/14/2019 Patient Name: Lisa [name] DOB: 1971-01-27 MRN: [id number] ULTRASOUND REPORT Location: [facility] Date of Service: 03/14/2019 Indications:Abnormal Uterine Bleeding Findings: The uterus is retroverted and measures 9.3 x 5.8 x 5.4 cm. Echo texture is heterogenous without evidence of focal masses. The Endometrium measures 20.3 mm. Right Ovary measures 3.3 x 2.5 x 1.7 cm. It is normal in appearance. Left Ovary measures 4.5 x 2.2 x 2.2 cm. It is normal in appearance. There is a small complex cyst with blood flow around the periphery only than  Measures 17 x 13 x 13 mm. Survey of the adnexa demonstrates no adnexal masses. There is no free fluid in the cul de sac. Impression: 1. The endometrial lining is thick. Can not rule out a polyp or small fibroid. 2. Normal right ovary. 3. There is a small complex cyst int he left ovary. Probable corpus luteum. Recommendations: 1.Clinical correlation with the patient's History and Physical Exam. Gweneth Dimitri, RT Images reviewed.  Endometrium thickened in the setting of current provera use.  Small left ovarian cyst most consistent with a hemorrhagic corpus luteum cyst. Malachy Mood, MD, [place], [facility] 03/14/2019, 9:38 AM   Ct Chest High Resolution  Result Date: 03/13/2019 CLINICAL DATA:  Interstitial lung disease. Chronic shortness of breath and cough. EXAM: CT CHEST WITHOUT CONTRAST TECHNIQUE: Multidetector CT imaging of the chest was performed following the standard protocol without intravenous contrast. High resolution imaging of the lungs, as well as inspiratory and expiratory imaging, was performed. COMPARISON:  05/19/2017. FINDINGS: Cardiovascular: Pulmonic trunk is enlarged. Heart size  normal. No pericardial effusion. Mediastinum/Nodes: Mediastinal lymph nodes are not enlarged by CT size criteria. Hilar regions are difficult to evaluate without IV contrast. No axillary adenopathy. Esophagus is unremarkable. Lungs/Pleura: Interstitial thickening, ground-glass and traction bronchiectasis/bronchiolectasis. There is some subpleural involvement as well but it is not a dominant feature. Findings appears similar to 05/19/2017. No air trapping. No pleural fluid. Airway is unremarkable. Upper Abdomen: 1.6 cm low-attenuation lesion in the liver is similar and likely a cyst. Visualized portions of the adrenal  glands, right kidney, spleen, pancreas, stomach and bowel are grossly unremarkable. There may be a tiny hiatal hernia. No upper abdominal adenopathy. Musculoskeletal: No worrisome lytic or sclerotic lesions. IMPRESSION: 1. 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 Findings are suggestive of an alternative diagnosis (not UIP) per consensus guidelines: Diagnosis of Idiopathic Pulmonary Fibrosis: An Official ATS/ERS/JRS/ALAT Clinical Practice Guideline. [place], Iss 5, [phone], Mar 13 2017. 2. Enlarged pulmonic trunk, indicative of pulmonary arterial hypertension. Electronically Signed   By: Lorin Picket M.D.   On: 03/13/2019 11:38   US Venous Img Lower Unilateral Right  Result Date: 03/03/2019 CLINICAL DATA:  Right leg pain for 5 days. EXAM: RIGHT LOWER EXTREMITY VENOUS DOPPLER ULTRASOUND TECHNIQUE: Gray-scale sonography with graded compression, as well as color Doppler and duplex ultrasound were performed to evaluate the lower extremity deep venous systems from the level of the common femoral vein and including the common femoral, femoral, profunda femoral, popliteal and calf veins including the posterior tibial, peroneal and gastrocnemius veins when visible. The  superficial great saphenous vein was also interrogated. Spectral Doppler was utilized to evaluate flow at rest and with distal augmentation maneuvers in the common femoral, femoral and popliteal veins. COMPARISON:  None. FINDINGS: Contralateral Common Femoral Vein: Respiratory phasicity is normal and symmetric with the symptomatic side. No evidence of thrombus. Normal compressibility. Common Femoral Vein: No evidence of thrombus. Normal compressibility, respiratory phasicity and response to augmentation. Saphenofemoral Junction: No evidence of thrombus. Normal compressibility and flow on color Doppler imaging. Profunda Femoral Vein: No evidence of thrombus. Normal compressibility and flow on color Doppler imaging. Femoral Vein: No evidence of thrombus. Normal compressibility, respiratory phasicity and response to augmentation. Popliteal Vein: No evidence of thrombus. Normal compressibility, respiratory phasicity and response to augmentation. Calf Veins: Occlusive thrombus in the posterior tibial vein with lack of compressibility. No evidence of thrombus in the peroneal vein. Superficial Great Saphenous Vein: No evidence of thrombus. Normal compressibility. Venous Reflux:  None. Other Findings: Superficial thrombus in the right mid calf in the area of patient pain. IMPRESSION: 1. Occlusive thrombus in the posterior tibial vein. No thrombus proximal to the knee. 2. Superficial thrombophlebitis in the right mid calf. These results were called by telephone at the time of interpretation on 03/03/2019 at 12:55 am to Dr. Harvest Dark , who verbally acknowledged these results. Electronically Signed   By: Keith Rake M.D.   On: 03/03/2019 00:55    Assessment: 48 y.o.  ER follow up for AUB  Plan: Problem List Items Addressed This Visit    None    Visit Diagnoses    Screening for malignant neoplasm of cervix    -  Primary   Relevant Orders   Cytology - PAP   Abnormal uterine bleeding       Relevant  Orders   Surgical pathology   Thickened endometrium       Relevant Orders   Surgical pathology      1) AUB - no vaginal bleeding since starting provera despite anticoagulation. We discussed long term management options including po/IM provera, Mirena IUD, ablation, and hysterectomy - most intesteste in ablation at present - will need to wait until cleared by hematology/vascular on current acute clot - EMBX and pap today - [place] reviewed no evidence of structural lesion but thickened endometrium - Laboratory work up negative  2) The patient was counseled on the overall effectiveness  of endometrial ablation in achieving amenorrhea.  She is aware that some patient may continue to have menstrual cycles although these are generally greatly reduced in flow.  In addition she was quoted a failure rate for endometrial ablation of approximately 25% within the frist 4 years, but these failures may happen at any time during or after the initial 4 year postop period.  She is aware that pregnancy is contra-indicated in the setting of prior endometrial ablation, and that ablation itself does not confer any contraceptive benefit.  She will therefore need to continue to rely on some means of contraception following the procedure.  Although rare and generally confined to patient who have undergone prior tubal ligatoin, post-ablation tubal sterilizaton syndrome (PATSS) may also occur with no reliable incidence rates as the majority of published literature is limited to case reports.   Prior to being considered a candidate for Novasure ablation she will need to undergo endometrial biopsy to rule out endometrial hyperplasia or malignancy as the cause of her bleeding, have an up to date pap on record, and undergo transvaginal ultrasound to verify the absence of focal endometrial lesion which may need to be addressed prior to proceeding with ablation.  In addition she is aware that the device is limited for use in women with a  normal uterine cavity and uterine leiomyomata <3cm in size.  If present leiomyomata may increase the long-term failure rate of the procedure.  In rare instances the presence of a uterine septum or arcuate uterus, which may not be readily apparent on preoperative ultrasound, may necessitate the procedure to be aborted.    3) A total of 15 minutes were spent in face-to-face contact with the patient during this encounter with over half of that time devoted to counseling and coordination of care.  4) Return if symptoms worsen or fail to improve.   Malachy Mood, MD, [facility], [place]

## 2019-03-15 ENCOUNTER — Inpatient Hospital Stay: Payer: Medicare Other | Attending: Internal Medicine | Admitting: Internal Medicine

## 2019-03-15 ENCOUNTER — Other Ambulatory Visit: Payer: Self-pay

## 2019-03-15 ENCOUNTER — Inpatient Hospital Stay: Payer: Medicare Other

## 2019-03-15 ENCOUNTER — Encounter: Payer: Self-pay | Admitting: Internal Medicine

## 2019-03-15 DIAGNOSIS — Z801 Family history of malignant neoplasm of trachea, bronchus and lung: Secondary | ICD-10-CM | POA: Diagnosis not present

## 2019-03-15 DIAGNOSIS — Z7901 Long term (current) use of anticoagulants: Secondary | ICD-10-CM

## 2019-03-15 DIAGNOSIS — Z800 Family history of malignant neoplasm of digestive organs: Secondary | ICD-10-CM | POA: Diagnosis not present

## 2019-03-15 DIAGNOSIS — Z86718 Personal history of other venous thrombosis and embolism: Secondary | ICD-10-CM | POA: Diagnosis not present

## 2019-03-15 DIAGNOSIS — Z86711 Personal history of pulmonary embolism: Secondary | ICD-10-CM | POA: Insufficient documentation

## 2019-03-15 DIAGNOSIS — G4730 Sleep apnea, unspecified: Secondary | ICD-10-CM

## 2019-03-15 DIAGNOSIS — Z79899 Other long term (current) drug therapy: Secondary | ICD-10-CM | POA: Diagnosis not present

## 2019-03-15 DIAGNOSIS — J45909 Unspecified asthma, uncomplicated: Secondary | ICD-10-CM | POA: Insufficient documentation

## 2019-03-15 DIAGNOSIS — N920 Excessive and frequent menstruation with regular cycle: Secondary | ICD-10-CM | POA: Insufficient documentation

## 2019-03-15 DIAGNOSIS — F319 Bipolar disorder, unspecified: Secondary | ICD-10-CM | POA: Insufficient documentation

## 2019-03-15 DIAGNOSIS — I82431 Acute embolism and thrombosis of right popliteal vein: Secondary | ICD-10-CM

## 2019-03-15 DIAGNOSIS — Z803 Family history of malignant neoplasm of breast: Secondary | ICD-10-CM | POA: Diagnosis not present

## 2019-03-15 DIAGNOSIS — J8410 Pulmonary fibrosis, unspecified: Secondary | ICD-10-CM | POA: Diagnosis not present

## 2019-03-15 DIAGNOSIS — Z793 Long term (current) use of hormonal contraceptives: Secondary | ICD-10-CM | POA: Insufficient documentation

## 2019-03-15 DIAGNOSIS — D500 Iron deficiency anemia secondary to blood loss (chronic): Secondary | ICD-10-CM | POA: Diagnosis not present

## 2019-03-15 LAB — ANTITHROMBIN III: AntiThromb III Func: 120 % (ref 75–120)

## 2019-03-15 MED ORDER — ELIQUIS 5 MG VTE STARTER PACK: ORAL_TABLET | ORAL | 2 refills | Status: DC

## 2019-03-15 NOTE — Progress Notes (Signed)
[facility] CONSULT NOTE  Patient Care Team: [place], [facility] as PCP - Madaline Guthrie, MD as PCP - OBGYN (Obstetrics and Gynecology)  CHIEF COMPLAINTS/PURPOSE OF CONSULTATION:    HEMATOLOGY HISTORY  # August 2020 acute DVT right posterior tibial vein/calf; superficial thrombosis-Eliquis  # FEB 2016- PULMONARY EMBOLISM/ hx R calf DVT [Dr.Gittin s/p surgery on lung anticoagulation for 6 months  # AUG 2020- Chronic iron deficient anemia-menorrhagia [Dr.Stabler]; Ferritin-5colo- ? 2 years ago.   # Chronic Lung disease-pneumonitis/fibrosis/ Dr.Fleming  HISTORY OF PRESENTING ILLNESS: Poor historian  Lisa [name] 48 y.o.  female has been referred to [place] for further evaluation-and recommendations for right calf DVT/anemia.  Patient states that she had a DVT of the right calf approximately 4 years ago.  At the same time she had a PE-for which she was on anticoagulation for about 6 months.  She states that she had the blood clot while recovering from lung surgery.   Patient also states to have chronic anemia/heavy menstrual cycles "all her life".  Recently admitted to hospital for fairly sudden onset of cramping pain in the right calf/tender.  She is also found to be severely anemic at 7.4 hemoglobin.  Patient received PRBC transfusion/IV iron.  As per the patient IVC filter was contemplated-but not done.  Patient was evaluated by gynecology started on progesterone oral pills.  Also started Eliquis.  Patient complains of ongoing fatigue.  Currently on p.o. iron once a day.   Review of Systems  Constitutional: Positive for malaise/fatigue. Negative for chills, diaphoresis, fever and weight loss.  HENT: Negative for nosebleeds and sore throat.   Eyes: Negative for double vision.  Respiratory: Positive for shortness of breath. Negative for cough, hemoptysis, sputum production and wheezing.   Cardiovascular: Negative for chest pain, palpitations, orthopnea  and leg swelling.  Gastrointestinal: Negative for abdominal pain, blood in stool, constipation, diarrhea, heartburn, melena, nausea and vomiting.       Black stools (on iron pills)  Genitourinary: Negative for dysuria, frequency and urgency.  Musculoskeletal: Negative for back pain and joint pain.  Skin: Negative.  Negative for itching and rash.  Neurological: Positive for dizziness. Negative for tingling, focal weakness, weakness and headaches.  Endo/Heme/Allergies: Does not bruise/bleed easily.  Psychiatric/Behavioral: Negative for depression. The patient is not nervous/anxious and does not have insomnia.     MEDICAL HISTORY:  Past Medical History:  Diagnosis Date  . Abdominal pain   . Anemia    hx of anemic  . Asthma   . Bipolar 1 disorder ([place])   . Blood in stool   . Change in voice   . Chest pain   . Constipation   . Depression   . Diverticulitis   . DVT (deep venous thrombosis) ([place]) 12/13/2014  . Dysrhythmia   . Fatigue   . Gallstones 04/10/2011   with biliary pancreatitis  . GERD (gastroesophageal reflux disease)   . Migraines   . MRSA carrier   . Nausea   . Pancreatitis   . PTSD (post-traumatic stress disorder)   . Pulmonary embolism ([place]) 12/13/2014  . Sleep apnea    cpap  . Trouble swallowing     SURGICAL HISTORY: Past Surgical History:  Procedure Laterality Date  . BRAVO [place] STUDY  06/02/2011   Procedure: BRAVO [place] STUDY;  Surgeon: Inda Castle, MD;  Location: [facility];  Service: Endoscopy;  Laterality: N/A;  . CHOLECYSTECTOMY    . DILATION AND CURETTAGE OF UTERUS    .  ESOPHAGOGASTRODUODENOSCOPY  06/02/2011   Procedure: ESOPHAGOGASTRODUODENOSCOPY (EGD);  Surgeon: Inda Castle, MD;  Location: [facility];  Service: Endoscopy;  Laterality: N/A;  . LAPAROSCOPIC CHOLECYSTECTOMY W/ CHOLANGIOGRAPHY  04/25/2011   Dr Margot Chimes  . NASAL POLYP SURGERY     polyp from vocal cords  . WISDOM TOOTH EXTRACTION      SOCIAL HISTORY: Social History    Socioeconomic History  . Marital status: Single    Spouse name: Not on file  . Number of children: 7  . Years of education: Not on file  . Highest education level: Not on file  Occupational History  . Occupation: Designer, fashion/clothing: Scientist, research (physical sciences)  Social Needs  . Financial resource strain: Not on file  . Food insecurity    Worry: Not on file    Inability: Not on file  . Transportation needs    Medical: Not on file    Non-medical: Not on file  Tobacco Use  . Smoking status: Never Smoker  . Smokeless tobacco: Never Used  Substance and Sexual Activity  . Alcohol use: Yes    Comment: occ  . Drug use: No  . Sexual activity: Yes    Birth control/protection: Injection  Lifestyle  . Physical activity    Days per week: Not on file    Minutes per session: Not on file  . Stress: Not on file  Relationships  . Social Herbalist on phone: Not on file    Gets together: Not on file    Attends religious service: Not on file    Active member of club or organization: Not on file    Attends meetings of clubs or organizations: Not on file    Relationship status: Not on file  . Intimate partner violence    Fear of current or ex partner: Not on file    Emotionally abused: Not on file    Physically abused: Not on file    Forced sexual activity: Not on file  Other Topics Concern  . Not on file  Social History Narrative   In [place]; with 2 sons; used to work in hospitality. Never smoked; ocassional alcohol.     FAMILY HISTORY: Family History  Problem Relation Age of Onset  . Diabetes Mother   . Pancreatic cancer Mother   . Lung cancer Father   . Cancer Paternal Aunt breast  . Anesthesia problems Neg Hx   . Hypotension Neg Hx   . Malignant hyperthermia Neg Hx   . Pseudochol deficiency Neg Hx     ALLERGIES:  is allergic to lisinopril.  MEDICATIONS:  Current Outpatient Medications  Medication Sig Dispense Refill  . AIMOVIG 140 MG/ML SOAJ Inject 140 mg  into the skin every 30 (thirty) days.    Marland Kitchen albuterol (PROVENTIL HFA;VENTOLIN HFA) 108 (90 Base) MCG/ACT inhaler Inhale 2 puffs into the lungs every 6 (six) hours as needed for wheezing or shortness of breath. 1 Inhaler 2  . azaTHIOprine (IMURAN) 50 MG tablet Take 50 mg by mouth daily.    . Chromium 500 MCG TABS Take 500 mcg by mouth daily.    . Eliquis DVT/PE Starter Pack (ELIQUIS STARTER PACK) 5 MG TABS Take as directed on package: start with two-5mg  tablets twice daily for 7 days. On day 8, switch to one-5mg  tablet twice daily. 1 each 2  . Ipratropium-Albuterol (COMBIVENT) 20-100 MCG/ACT AERS respimat Inhale 1 puff into the lungs every 6 (six) hours.    . medroxyPROGESTERone (  PROVERA) 10 MG tablet Take 2 tablets (20 mg total) by mouth daily for 20 days. 40 tablet 0  . omeprazole (PRILOSEC) 40 MG capsule Take 40 mg by mouth daily.    . Polysaccharide Iron Complex 15 MG/ML LIQD Take 5 mLs by mouth 2 (two) times daily. 300 mL 2  . predniSONE (DELTASONE) 10 MG tablet Take 20 mg by mouth daily.    . Probiotic Product (ACIDOPHILUS/BIFIDUS PO) Take 1 capsule by mouth daily.    . propranolol (INDERAL) 20 MG tablet Take 20 mg by mouth 2 (two) times daily.    . SUMAtriptan (IMITREX) 100 MG tablet TAKE 1 TABLET BY MOUTH ONCE DAILY AS NEEDED FOR MIGRAINE FOR UP TO 1 DOSE MAY TAKE A SECOND DOSE AFTER 2 HOURS IF NEEDED     No current facility-administered medications for this visit.       PHYSICAL EXAMINATION:   Vitals:   03/15/19 1023  BP: 135/82  Pulse: 73  Resp: 20  Temp: 98.8 F (37.1 C)   Filed Weights   03/15/19 1023  Weight: 271 lb 6.4 oz (123.1 kg)    Physical Exam  Constitutional: She is oriented to person, place, and time and well-developed, well-nourished, and in no distress.  Obese.  HENT:  Head: Normocephalic and atraumatic.  Mouth/Throat: Oropharynx is clear and moist. No oropharyngeal exudate.  Eyes: Pupils are equal, round, and reactive to light.  Neck: Normal range of  motion. Neck supple.  Cardiovascular: Normal rate and regular rhythm.  Pulmonary/Chest: Effort normal and breath sounds normal. No respiratory distress. She has no wheezes.  Abdominal: Soft. Bowel sounds are normal. She exhibits no distension and no mass. There is no abdominal tenderness. There is no rebound and no guarding.  Musculoskeletal: Normal range of motion.        General: No tenderness or edema.  Neurological: She is alert and oriented to person, place, and time.  Skin: Skin is warm.  Psychiatric: Affect normal.    LABORATORY DATA:  I have reviewed the data as listed Lab Results  Component Value Date   WBC 15.3 (H) 03/05/2019   HGB 7.4 (L) 03/05/2019   HCT 26.1 (L) 03/05/2019   MCV 66.9 (L) 03/05/2019   PLT 362 03/05/2019   Recent Labs    03/02/19 2304 03/03/19 2051  NA 136 139  K 3.8 4.0  CL 107 107  CO2 24 23  GLUCOSE 102* 107*  BUN 8 9  CREATININE 0.71 0.73  CALCIUM 8.9 9.3  GFRNONAA >60 >60  GFRAA >60 >60  PROT 7.7  --   ALBUMIN 3.9  --   AST 15  --   ALT 12  --   ALKPHOS 42  --   BILITOT 0.4  --      Dg Chest 1 View  Result Date: 03/03/2019 CLINICAL DATA:  Shortness of breath. Right leg pain. EXAM: CHEST  1 VIEW COMPARISON:  CT 05/19/2017, radiograph 02/27/2017 FINDINGS: Unchanged heart size and mediastinal contours. Multifocal regions of scarring and architectural distortion are not significantly changed from prior imaging. No acute airspace disease. No pleural fluid or pneumothorax. Unchanged osseous structures. IMPRESSION: Multifocal regions of scarring are not significantly changed from prior imaging consistent with interstitial lung disease. No superimposed acute abnormality. Electronically Signed   By: Keith Rake M.D.   On: 03/03/2019 01:05   US Transvaginal Non-ob  Result Date: 03/14/2019 Patient Name: Lisa [name] DOB: 22-Jan-1971 MRN: [id number] ULTRASOUND REPORT Location: [facility] Date of Service: 03/14/2019  Indications:Abnormal  Uterine Bleeding Findings: The uterus is retroverted and measures 9.3 x 5.8 x 5.4 cm. Echo texture is heterogenous without evidence of focal masses. The Endometrium measures 20.3 mm. Right Ovary measures 3.3 x 2.5 x 1.7 cm. It is normal in appearance. Left Ovary measures 4.5 x 2.2 x 2.2 cm. It is normal in appearance. There is a small complex cyst with blood flow around the periphery only than  Measures 17 x 13 x 13 mm. Survey of the adnexa demonstrates no adnexal masses. There is no free fluid in the cul de sac. Impression: 1. The endometrial lining is thick. Can not rule out a polyp or small fibroid. 2. Normal right ovary. 3. There is a small complex cyst int he left ovary. Probable corpus luteum. Recommendations: 1.Clinical correlation with the patient's History and Physical Exam. Gweneth Dimitri, RT Images reviewed.  Endometrium thickened in the setting of current provera use.  Small left ovarian cyst most consistent with a hemorrhagic corpus luteum cyst. Malachy Mood, MD, [facility] 03/14/2019, 9:38 AM   Ct Chest High Resolution  Result Date: 03/13/2019 CLINICAL DATA:  Interstitial lung disease. Chronic shortness of breath and cough. EXAM: CT CHEST WITHOUT CONTRAST TECHNIQUE: Multidetector CT imaging of the chest was performed following the standard protocol without intravenous contrast. High resolution imaging of the lungs, as well as inspiratory and expiratory imaging, was performed. COMPARISON:  05/19/2017. FINDINGS: Cardiovascular: Pulmonic trunk is enlarged. Heart size normal. No pericardial effusion. Mediastinum/Nodes: Mediastinal lymph nodes are not enlarged by CT size criteria. Hilar regions are difficult to evaluate without IV contrast. No axillary adenopathy. Esophagus is unremarkable. Lungs/Pleura: Interstitial thickening, ground-glass and traction bronchiectasis/bronchiolectasis. There is some subpleural involvement as well but it is not a dominant  feature. Findings appears similar to 05/19/2017. No air trapping. No pleural fluid. Airway is unremarkable. Upper Abdomen: 1.6 cm low-attenuation lesion in the liver is similar and likely a cyst. Visualized portions of the adrenal glands, right kidney, spleen, pancreas, stomach and bowel are grossly unremarkable. There may be a tiny hiatal hernia. No upper abdominal adenopathy. Musculoskeletal: No worrisome lytic or sclerotic lesions. IMPRESSION: 1. 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 Findings are suggestive of an alternative diagnosis (not UIP) per consensus guidelines: Diagnosis of Idiopathic Pulmonary Fibrosis: An Official ATS/ERS/JRS/ALAT Clinical Practice Guideline. [place], Iss 5, [phone], Mar 13 2017. 2. Enlarged pulmonic trunk, indicative of pulmonary arterial hypertension. Electronically Signed   By: Lorin Picket M.D.   On: 03/13/2019 11:38   US Venous Img Lower Unilateral Right  Result Date: 03/03/2019 CLINICAL DATA:  Right leg pain for 5 days. EXAM: RIGHT LOWER EXTREMITY VENOUS DOPPLER ULTRASOUND TECHNIQUE: Gray-scale sonography with graded compression, as well as color Doppler and duplex ultrasound were performed to evaluate the lower extremity deep venous systems from the level of the common femoral vein and including the common femoral, femoral, profunda femoral, popliteal and calf veins including the posterior tibial, peroneal and gastrocnemius veins when visible. The superficial great saphenous vein was also interrogated. Spectral Doppler was utilized to evaluate flow at rest and with distal augmentation maneuvers in the common femoral, femoral and popliteal veins. COMPARISON:  None. FINDINGS: Contralateral Common Femoral Vein: Respiratory phasicity is normal and symmetric with the symptomatic side. No evidence of thrombus. Normal compressibility. Common  Femoral Vein: No evidence of thrombus. Normal compressibility, respiratory phasicity and response  to augmentation. Saphenofemoral Junction: No evidence of thrombus. Normal compressibility and flow on color Doppler imaging. Profunda Femoral Vein: No evidence of thrombus. Normal compressibility and flow on color Doppler imaging. Femoral Vein: No evidence of thrombus. Normal compressibility, respiratory phasicity and response to augmentation. Popliteal Vein: No evidence of thrombus. Normal compressibility, respiratory phasicity and response to augmentation. Calf Veins: Occlusive thrombus in the posterior tibial vein with lack of compressibility. No evidence of thrombus in the peroneal vein. Superficial Great Saphenous Vein: No evidence of thrombus. Normal compressibility. Venous Reflux:  None. Other Findings: Superficial thrombus in the right mid calf in the area of patient pain. IMPRESSION: 1. Occlusive thrombus in the posterior tibial vein. No thrombus proximal to the knee. 2. Superficial thrombophlebitis in the right mid calf. These results were called by telephone at the time of interpretation on 03/03/2019 at 12:55 am to Dr. Harvest Dark , who verbally acknowledged these results. Electronically Signed   By: Keith Rake M.D.   On: 03/03/2019 00:55    Acute deep vein thrombosis (DVT) of popliteal vein of right lower extremity ([place]) # Acute DVT- right posterior popliteal vein currently Eliquis.  Unclear etiology; prior history of question provoked DVT PE in 2016.  Discussed that in general for calf DVT would recommend 3 months of anticoagulation; however given patient's prior history of DVT PE-longer-term anticoagulation will be recommended.  Given the recurrent thrombotic event -I would recommend checking hypercoagulable work-up.  We will hold off lupus anticoagulant secondary to Eliquis.  #Iron deficient anemia-chronic menorrhagia-proceed with Feraheme. Discussed the potential acute infusion  reactions with IV iron; which are quite rare.  Patient understands the risk; will proceed with infusions.  #Chronic menorrhagia-on medroxyprogresterone as per gynecology.  This will need to be monitored closely as patient has previous blood clots/current DVT.  Thank you Dr.Kalisetti for allowing me to participate in the care of your pleasant patient. Please do not hesitate to contact me with questions or concerns in the interim.  # DISPOSITION: # labs today # IV ferrahem weekly x 3; # follow up in 6 weeks-MD-labs; cbc/possible Ferrahem-Dr.B    All questions were answered. The patient knows to call the clinic with any problems, questions or concerns.    Cammie Sickle, MD 03/15/2019 1:16 PM

## 2019-03-15 NOTE — Assessment & Plan Note (Addendum)
#   Acute DVT- right posterior popliteal vein currently Eliquis.  Unclear etiology; prior history of question provoked DVT PE in 2016.  Discussed that in general for calf DVT would recommend 3 months of anticoagulation; however given patient's prior history of DVT PE-longer-term anticoagulation will be recommended.  Given the recurrent thrombotic event -I would recommend checking hypercoagulable work-up.  We will hold off lupus anticoagulant secondary to Eliquis.  Patient will call for refill on Eliquis.  #Iron deficient anemia-chronic menorrhagia-proceed with Feraheme. Discussed the potential acute infusion reactions with IV iron; which are quite rare.  Patient understands the risk; will proceed with infusions.  #Chronic menorrhagia-on medroxyprogresterone as per gynecology.  This will need to be monitored closely as patient has previous blood clots/current DVT.  Thank you Dr.Kalisetti for allowing me to participate in the care of your pleasant patient. Please do not hesitate to contact me with questions or concerns in the interim.  # DISPOSITION: # labs today # IV ferrahem weekly x 3; # follow up in 6 weeks-MD-labs; cbc/possible Ferrahem-Dr.B

## 2019-03-16 ENCOUNTER — Other Ambulatory Visit: Payer: Self-pay | Admitting: Internal Medicine

## 2019-03-16 LAB — CYTOLOGY - PAP: Diagnosis: NEGATIVE

## 2019-03-16 LAB — CARDIOLIPIN ANTIBODIES, IGG, IGM, IGA
Anticardiolipin IgA: <9 APL U/mL (ref 0–11)
Anticardiolipin IgG: <9 GPL U/mL (ref 0–14)
Anticardiolipin IgM: <9 MPL U/mL (ref 0–12)

## 2019-03-16 LAB — PROTEIN S, TOTAL: Protein S Ag, Total: 90 % (ref 60–150)

## 2019-03-17 ENCOUNTER — Telehealth: Payer: Self-pay | Admitting: Obstetrics and Gynecology

## 2019-03-17 ENCOUNTER — Telehealth: Payer: Self-pay | Admitting: *Deleted

## 2019-03-17 NOTE — Telephone Encounter (Signed)
-----   Message from Malachy Mood, MD sent at 03/16/2019  4:38 PM EDT ----- Spoke with Dr. Lucky Cowboy from vascular   "I would say that she is okay to go ahead and proceed with surgery.  Her current clot is a low risk clot in the tibial veins and as long as she can remain on anticoagulation that poses minimal risk."  So we can move her surgery date up to first available.   Malachy Mood, MD, [facility], [facility] 03/16/2019, 4:39 PM

## 2019-03-17 NOTE — Telephone Encounter (Signed)
-----   Message from Malachy Mood, MD sent at 03/14/2019  9:42 PM EDT ----- Regarding: surgery Surgery Date: 3 months Level 3  LOS: same day surgery  Surgery Booking Request Patient Full Name: Sheree Nagi MRN: [id number]  DOB: 1970/07/30  Surgeon: Malachy Mood, MD  Requested Surgery Date and Time:  Primary Diagnosis and Code: Abnormal uterine bleeding N93.9 Secondary Diagnosis and Code:  Surgical Procedure: hysteroscopy, novasure endometrial ablation L&D Notification:no Admission Status: same day surgery Length of Surgery: 1hr Special Case Needs: none H&P: week of (date) Phone Interview or Office Pre-Admit: pre-admit Interpreter: No Language: English Medical Clearance: Yes vascular surgery or hematology (acute blood clot) Special Scheduling Instructions: At least 3 month after starting treatment for current clot

## 2019-03-17 NOTE — Telephone Encounter (Signed)
OR date 03/30/19. See other telephone message.

## 2019-03-17 NOTE — Telephone Encounter (Signed)
Patient is aware of H&P at [facility] on 03/21/19 @ 4:30pm w/ Dr. Georgianne Fick, Pre-admit testing to be scheduled, Covid testing on 03/27/19, and OR on 03/30/19. Patient is aware she will be asked to quarantine after Covid testing. Patient is aware she may receive calls from the [facility] and [facility]. Patient confirmed Medicare and Medicaid.

## 2019-03-17 NOTE — Telephone Encounter (Signed)
Patient states she was to call Dr Perley Jain to let him know if she has refills on her Eliquis or not. She states she DOES have refills on her current prescription and does not need any more right now

## 2019-03-19 LAB — PROTEIN C, TOTAL: Protein C, Total: 144 % (ref 60–150)

## 2019-03-19 LAB — BETA-2-GLYCOPROTEIN I ABS, IGG/M/A
Beta-2 Glyco I IgG: <9 GPI IgG units (ref 0–20)
Beta-2-Glycoprotein I IgA: <9 GPI IgA units (ref 0–25)
Beta-2-Glycoprotein I IgM: <9 GPI IgM units (ref 0–32)

## 2019-03-21 ENCOUNTER — Encounter (INDEPENDENT_AMBULATORY_CARE_PROVIDER_SITE_OTHER): Payer: Self-pay | Admitting: Vascular Surgery

## 2019-03-21 ENCOUNTER — Ambulatory Visit (INDEPENDENT_AMBULATORY_CARE_PROVIDER_SITE_OTHER): Payer: Medicare Other | Admitting: Vascular Surgery

## 2019-03-21 ENCOUNTER — Other Ambulatory Visit: Payer: Self-pay

## 2019-03-21 ENCOUNTER — Ambulatory Visit (INDEPENDENT_AMBULATORY_CARE_PROVIDER_SITE_OTHER): Payer: Medicare Other | Admitting: Obstetrics and Gynecology

## 2019-03-21 ENCOUNTER — Encounter: Payer: Self-pay | Admitting: Obstetrics and Gynecology

## 2019-03-21 VITALS — BP 117/78 | HR 76 | Resp 16 | Ht 68.0 in | Wt 270.0 lb

## 2019-03-21 VITALS — BP 128/64 | HR 74 | Ht 68.0 in | Wt 274.0 lb

## 2019-03-21 DIAGNOSIS — Z01818 Encounter for other preprocedural examination: Secondary | ICD-10-CM

## 2019-03-21 DIAGNOSIS — G43909 Migraine, unspecified, not intractable, without status migrainosus: Secondary | ICD-10-CM | POA: Insufficient documentation

## 2019-03-21 DIAGNOSIS — I82441 Acute embolism and thrombosis of right tibial vein: Secondary | ICD-10-CM | POA: Diagnosis not present

## 2019-03-21 DIAGNOSIS — J381 Polyp of vocal cord and larynx: Secondary | ICD-10-CM | POA: Insufficient documentation

## 2019-03-21 DIAGNOSIS — K5909 Other constipation: Secondary | ICD-10-CM | POA: Insufficient documentation

## 2019-03-21 DIAGNOSIS — N939 Abnormal uterine and vaginal bleeding, unspecified: Secondary | ICD-10-CM

## 2019-03-21 DIAGNOSIS — N920 Excessive and frequent menstruation with regular cycle: Secondary | ICD-10-CM | POA: Insufficient documentation

## 2019-03-21 DIAGNOSIS — N924 Excessive bleeding in the premenopausal period: Secondary | ICD-10-CM

## 2019-03-21 DIAGNOSIS — I10 Essential (primary) hypertension: Secondary | ICD-10-CM

## 2019-03-21 LAB — FACTOR 5 LEIDEN

## 2019-03-21 NOTE — Assessment & Plan Note (Signed)
Increases her risk of postphlebitic symptoms.

## 2019-03-21 NOTE — Assessment & Plan Note (Signed)
Being seen by gynecology and going to have an ablation in the near future.  This is particularly important given her need for anticoagulation

## 2019-03-21 NOTE — Progress Notes (Signed)
MRN : [id number]  Lisa [name] is a 48 y.o. (12-12-1970) female who presents with chief complaint of  Chief Complaint  Patient presents with  . New Patient (Initial Visit)    F/u from [facility] DVT  .  History of Present Illness: Patient returns today in follow up of her recent right calf DVT.  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  As such, she was referred to hematology who is considering a longer term of anticoagulation and she currently has a hypercoagulable work-up that is partially back with no abnormality seen thus far.  She has tolerated anticoagulation with no signs of bleeding.  She reports her calf pain resolved after about a week to 10 days.  She still has some occasional aching and heaviness in her leg.  No appreciable swelling at this point.  Current Outpatient Medications  Medication Sig Dispense Refill  . AIMOVIG 140 MG/ML SOAJ Inject 140 mg into the skin every 30 (thirty) days.    Marland Kitchen albuterol (PROVENTIL HFA;VENTOLIN HFA) 108 (90 Base) MCG/ACT inhaler Inhale 2 puffs into the lungs every 6 (six) hours as needed for wheezing or shortness of breath. 1 Inhaler 2  . azaTHIOprine (IMURAN) 50 MG tablet Take 50 mg by mouth daily.    . Chromium 500 MCG TABS Take 500 mcg by mouth daily.    . Eliquis DVT/PE Starter Pack (ELIQUIS STARTER PACK) 5 MG TABS Take as directed on package: start with two-5mg  tablets twice daily for 7 days. On day 8, switch to one-5mg  tablet twice daily. 1 each 2  . Ipratropium-Albuterol (COMBIVENT) 20-100 MCG/ACT AERS respimat Inhale 1 puff into the lungs every 6 (six) hours.    . medroxyPROGESTERone (PROVERA) 10 MG tablet Take 2 tablets (20 mg total) by mouth daily for 20 days. 40 tablet 0  . omeprazole (PRILOSEC) 40 MG capsule Take 40 mg by mouth daily.    . Polysaccharide Iron Complex 15 MG/ML LIQD Take 5 mLs by mouth 2 (two) times daily. 300 mL 2  . predniSONE (DELTASONE) 10 MG tablet Take 20 mg by mouth daily.     . Probiotic Product (ACIDOPHILUS/BIFIDUS PO) Take 1 capsule by mouth daily.    . propranolol (INDERAL) 20 MG tablet Take 20 mg by mouth 2 (two) times daily.    . SUMAtriptan (IMITREX) 100 MG tablet TAKE 1 TABLET BY MOUTH ONCE DAILY AS NEEDED FOR MIGRAINE FOR UP TO 1 DOSE MAY TAKE A SECOND DOSE AFTER 2 HOURS IF NEEDED     No current facility-administered medications for this visit.     Past Medical History:  Diagnosis Date  . Abdominal pain   . Anemia    hx of anemic  . Asthma   . Bipolar 1 disorder ([place])   . Blood in stool   . Change in voice   . Chest pain   . Constipation   . Depression   . Diverticulitis   . DVT (deep venous thrombosis) ([place]) 12/13/2014  . Dysrhythmia   . Fatigue   . Gallstones 04/10/2011   with biliary pancreatitis  . GERD (gastroesophageal reflux disease)   . Migraines   . MRSA carrier   . Nausea   . Pancreatitis   . PTSD (post-traumatic stress disorder)   . Pulmonary embolism ([place]) 12/13/2014  . Sleep apnea    cpap  . Trouble swallowing     Past Surgical History:  Procedure Laterality Date  .  BRAVO [place] STUDY  06/02/2011   Procedure: BRAVO [place] STUDY;  Surgeon: Inda Castle, MD;  Location: [facility] ENDOSCOPY;  Service: Endoscopy;  Laterality: N/A;  . CHOLECYSTECTOMY    . DILATION AND CURETTAGE OF UTERUS    . ESOPHAGOGASTRODUODENOSCOPY  06/02/2011   Procedure: ESOPHAGOGASTRODUODENOSCOPY (EGD);  Surgeon: Inda Castle, MD;  Location: [facility];  Service: Endoscopy;  Laterality: N/A;  . LAPAROSCOPIC CHOLECYSTECTOMY W/ CHOLANGIOGRAPHY  04/25/2011   Dr Margot Chimes  . NASAL POLYP SURGERY     polyp from vocal cords  . WISDOM TOOTH EXTRACTION      Social History Social History   Tobacco Use  . Smoking status: Never Smoker  . Smokeless tobacco: Never Used  Substance Use Topics  . Alcohol use: Yes    Comment: occ  . Drug use: No    Family History Family History  Problem Relation Age of Onset  . Diabetes Mother   . Pancreatic cancer Mother   .  Lung cancer Father   . Cancer Paternal Aunt breast  . Anesthesia problems Neg Hx   . Hypotension Neg Hx   . Malignant hyperthermia Neg Hx   . Pseudochol deficiency Neg Hx     Allergies  Allergen Reactions  . Lisinopril Anaphylaxis   REVIEW OF SYSTEMS (Negative unless checked)  Constitutional: [] ?Weight loss  [] ?Fever  [] ?Chills Cardiac: [] ?Chest pain   [] ?Chest pressure   [] ?Palpitations   [] ?Shortness of breath when laying flat   [] ?Shortness of breath at rest   [] ?Shortness of breath with exertion. Vascular:  [x] ?Pain in legs with walking   [x] ?Pain in legs at rest   [x] ?Pain in legs when laying flat   [] ?Claudication   [] ?Pain in feet when walking  [] ?Pain in feet at rest  [] ?Pain in feet when laying flat   [x] ?History of DVT   [x] ?Phlebitis   [x] ?Swelling in legs   [] ?Varicose veins   [] ?Non-healing ulcers Pulmonary:   [] ?Uses home oxygen   [] ?Productive cough   [] ?Hemoptysis   [] ?Wheeze  [] ?COPD   [] ?Asthma Neurologic:  [] ?Dizziness  [] ?Blackouts   [] ?Seizures   [] ?History of stroke   [] ?History of TIA  [] ?Aphasia   [] ?Temporary blindness   [] ?Dysphagia   [] ?Weakness or numbness in arms   [] ?Weakness or numbness in legs Musculoskeletal:  [] ?Arthritis   [] ?Joint swelling   [] ?Joint pain   [] ?Low back pain Hematologic:  [] ?Easy bruising  [] ?Easy bleeding   [] ?Hypercoagulable state   [x] ?Anemic  [] ?Hepatitis Gastrointestinal:  [] ?Blood in stool   [] ?Vomiting blood  [] ?Gastroesophageal reflux/heartburn   [] ?Difficulty swallowing. Genitourinary:  [] ?Chronic kidney disease   [] ?Difficult urination  [] ?Frequent urination  [] ?Burning with urination   [] ?Blood in urine Skin:  [] ?Rashes   [] ?Ulcers   [] ?Wounds Psychological:  [] ?History of anxiety   [] ? History of major depression.    Physical Examination  BP 117/78 (BP Location: Right Arm)   Pulse 76   Resp 16   Ht 5\' 8"  (1.727 m)   Wt 270 lb (122.5 kg)   BMI 41.05 kg/m  Gen:  WD/WN, NAD Head: [place]/AT, No temporalis wasting.  Ear/Nose/Throat: Hearing grossly intact, nares w/o erythema or drainage Eyes: Conjunctiva clear. Sclera non-icteric Neck: Supple.  Trachea midline Pulmonary:  Good air movement, no use of accessory muscles.  Cardiac: RRR, no JVD Vascular:  Vessel Right Left  Radial Palpable Palpable  PT Palpable Palpable  DP Palpable Palpable   Gastrointestinal: soft, non-tender/non-distended. No guarding/reflex.  Musculoskeletal: M/S 5/5 throughout.  No deformity or atrophy.  No appreciable edema. Neurologic: Sensation grossly intact in extremities.  Symmetrical.  Speech is fluent.  Psychiatric: Judgment intact, Mood & affect appropriate for pt's clinical situation. Dermatologic: No rashes or ulcers noted.  No cellulitis or open wounds.       Labs Recent Results (from the past 2160 hour(s))  CBC     Status: Abnormal   Collection Time: 03/02/19 11:04 PM  Result Value Ref Range   WBC 13.6 (H) 4.0 - 10.5 K/uL   RBC 3.75 (L) 3.87 - 5.11 MIL/uL   Hemoglobin 6.7 (L) 12.0 - 15.0 g/dL    Comment: Reticulocyte Hemoglobin testing may be clinically indicated, consider ordering this additional test [id number]    HCT 23.9 (L) 36.0 - 46.0 %   MCV 63.7 (L) 80.0 - 100.0 fL   MCH 17.9 (L) 26.0 - 34.0 pg   MCHC 28.0 (L) 30.0 - 36.0 g/dL   RDW 21.9 (H) 11.5 - 15.5 %   Platelets 402 (H) 150 - 400 K/uL   nRBC 0.0 0.0 - 0.2 %    Comment: Performed at [facility], [address] [place], [place] [address]  Comprehensive metabolic panel     Status: Abnormal   Collection Time: 03/02/19 11:04 PM  Result Value Ref Range   Sodium 136 135 - 145 mmol/L   Potassium 3.8 3.5 - 5.1 mmol/L   Chloride 107 98 - 111 mmol/L   CO2 24 22 - 32 mmol/L   Glucose, Bld 102 (H) 70 - 99 mg/dL   BUN 8 6 - 20 mg/dL   Creatinine, Ser 0.71 0.44 - 1.00 mg/dL   Calcium 8.9 8.9 - 10.3 mg/dL   Total Protein 7.7 6.5 - 8.1 g/dL   Albumin 3.9 3.5 - 5.0 g/dL   AST 15 15 - 41 U/L   ALT 12 0 - 44  U/L   Alkaline Phosphatase 42 38 - 126 U/L   Total Bilirubin 0.4 0.3 - 1.2 mg/dL   GFR calc non Af Amer >60 >60 mL/min   GFR calc Af Amer >60 >60 mL/min   Anion gap 5 5 - 15    Comment: Performed at [facility], [address] [place], [place] [address]  TSH     Status: None   Collection Time: 03/02/19 11:04 PM  Result Value Ref Range   TSH 1.888 0.350 - 4.500 uIU/mL    Comment: Performed by a 3rd Generation assay with a functional sensitivity of <=0.01 uIU/mL. Performed at [facility], [address] [place], [place] [address]   Hemoglobin and hematocrit, blood     Status: Abnormal   Collection Time: 03/03/19  1:00 AM  Result Value Ref Range   Hemoglobin 6.6 (L) 12.0 - 15.0 g/dL   HCT 23.9 (L) 36.0 - 46.0 %    Comment: Performed at [facility], [address] [place], [place] [address]  Type and screen [place]     Status: None   Collection Time: 03/03/19  1:00 AM  Result Value Ref Range   ABO/RH(D) B POS    Antibody Screen NEG    Sample Expiration 03/06/2019,2359    Unit Number [id number]    Blood Component Type RED CELLS,LR    Unit division 00    Status of Unit ISSUED,FINAL    Transfusion Status OK TO TRANSFUSE  Crossmatch Result      Compatible Performed at [facility], [address] [place], [place] [address]   BPAM RBC     Status: None   Collection Time: 03/03/19  1:00 AM  Result Value Ref Range   ISSUE DATE / TIME [id number]    Blood Product Unit Number [id number]    PRODUCT CODE [id number]    Unit Type and Rh 7300    Blood Product Expiration Date [id number]   Prepare RBC     Status: None   Collection Time: 03/03/19  1:33 AM  Result Value Ref Range   Order Confirmation      ORDER PROCESSED BY BLOOD BANK Performed at [facility], [address] [place], [place] [address]   Vitamin B12     Status: None   Collection Time: 03/03/19  7:34 AM  Result Value Ref Range    Vitamin B-12 198 180 - 914 pg/mL    Comment: (NOTE) This assay is not validated for testing neonatal or myeloproliferative syndrome specimens for Vitamin B12 levels. Performed at [facility], [place] [address], [place], [place] [address]   Folate     Status: None   Collection Time: 03/03/19  7:34 AM  Result Value Ref Range   Folate 16.7 >5.9 ng/mL    Comment: Performed at [facility], [address] [place], [place] [address]  Iron and TIBC     Status: Abnormal   Collection Time: 03/03/19  7:34 AM  Result Value Ref Range   Iron 13 (L) 28 - 170 ug/dL   TIBC 501 (H) 250 - 450 ug/dL   Saturation Ratios 3 (L) 10.4 - 31.8 %   UIBC 488 ug/dL    Comment: Performed at [facility], [address] [place], [place] [address]  Ferritin     Status: Abnormal   Collection Time: 03/03/19  7:34 AM  Result Value Ref Range   Ferritin 5 (L) 11 - 307 ng/mL    Comment: Performed at [facility], [address] [place], [place] [address]  Reticulocytes     Status: Abnormal   Collection Time: 03/03/19  7:34 AM  Result Value Ref Range   Retic Ct Pct 1.1 0.4 - 3.1 %   RBC. 3.79 (L) 3.87 - 5.11 MIL/uL   Retic Count, Absolute 42.8 19.0 - 186.0 K/uL   Immature Retic Fract 36.8 (H) 2.3 - 15.9 %    Comment: Performed at [facility], [address] [place], [place] [address]  Novel Coronavirus, NAA (hospital order; send-out to ref lab)     Status: Abnormal   Collection Time: 03/03/19  7:43 AM   Specimen: Nasopharyngeal Swab; Respiratory  Result Value Ref Range   SARS-CoV-2, NAA DUPLICATE (A) NOT DETECTED    Comment: SEE [id number] FOR RESULTS Performed at [facility], [place] [address] [place], [place] [address]    Coronavirus Source NASOPHARYNGEAL     Comment: Performed at [facility], [address] [place],  [address]  SARS CORONAVIRUS 2     Status: None   Collection Time: 03/03/19  7:43 AM  Result Value Ref Range   SARS  Coronavirus 2 NEGATIVE NEGATIVE    Comment: (NOTE) SARS-CoV-2 target nucleic acids are NOT DETECTED. The SARS-CoV-2 RNA is generally detectable in upper and lower respiratory specimens during the acute phase of infection. Negative results do not preclude SARS-CoV-2 infection, do not rule out co-infections with other pathogens, and should  not be used as the sole basis for treatment or other patient management decisions. Negative results must be combined with clinical observations, patient history, and epidemiological information. The expected result is Negative. Fact Sheet for Patients: SugarRoll.be Fact Sheet for Healthcare Providers: https://www.woods-mathews.com/ This test is not yet approved or cleared by the [place] FDA and  has been authorized for detection and/or diagnosis of SARS-CoV-2 by FDA under an Emergency Use Authorization (EUA). This EUA will remain  in effect (meaning this test can be used) for the duration of the COVID-19 declaration under Section 56 4(b)(1) of the Act, 21 U.S.C. section 360bbb-3(b)(1), unless the authorization is terminated or revoked sooner. Performed at [facility], [place] [address], [place], [place] [address]   Prolactin     Status: Abnormal   Collection Time: 03/03/19 12:59 PM  Result Value Ref Range   Prolactin 24.2 (H) 4.8 - 23.3 ng/mL    Comment: (NOTE) Performed At: [facility] [place], [place] [id number] Rush Farmer MD [id number]   FSH/LH     Status: None   Collection Time: 03/03/19 12:59 PM  Result Value Ref Range   LH 4.9 mIU/mL    Comment: (NOTE)                    Adult Female:                      Follicular phase      2.4 -  12.6                      Ovulation phase      14.0 -  95.6                      Luteal phase          1.0 -  11.4                      Postmenopausal        7.7 -  58.5    FSH 4.7 mIU/mL    Comment: (NOTE)                     Adult Female:                      Follicular phase      3.5 -  12.5                      Ovulation phase       4.7 -  21.5                      Luteal phase          1.7 -   7.7                      Postmenopausal       25.8 - 134.8 Performed At: [facility] [place], [place] [id number] Rush Farmer MD [id number]   Estradiol     Status: None   Collection Time: 03/03/19 12:59 PM  Result Value Ref Range   Estradiol 97.3 pg/mL    Comment: (NOTE)                    Adult Female:  Follicular phase   [id number] -   166.0                      Ovulation phase    85.8 -   498.0                      Luteal phase       43.8 -   211.0                      Postmenopausal     <6.0 -    54.7                    Pregnancy                      1st trimester     215.0 - >4300.0 Roche ECLIA methodology Performed At: [facility] [place], [place] [id number] Rush Farmer MD [id number]   Raynaldo Opitz and Total     Status: None   Collection Time: 03/03/19 12:59 PM  Result Value Ref Range   Testosterone 13 8 - 48 ng/dL   Testosterone, Free 1.1 0.0 - 4.2 pg/mL    Comment: (NOTE) Performed At: [facility] [place], [place] [id number] Rush Farmer MD [id number]   CBC     Status: Abnormal   Collection Time: 03/03/19  8:51 PM  Result Value Ref Range   WBC 11.2 (H) 4.0 - 10.5 K/uL   RBC 4.08 3.87 - 5.11 MIL/uL   Hemoglobin 7.8 (L) 12.0 - 15.0 g/dL    Comment: Reticulocyte Hemoglobin testing may be clinically indicated, consider ordering this additional test [id number]    HCT 26.7 (L) 36.0 - 46.0 %   MCV 65.4 (L) 80.0 - 100.0 fL   MCH 19.1 (L) 26.0 - 34.0 pg   MCHC 29.2 (L) 30.0 - 36.0 g/dL   RDW 23.3 (H) 11.5 - 15.5 %   Platelets 379 150 - 400 K/uL   nRBC 0.2 0.0 - 0.2 %    Comment: Performed at [facility], [address] [place], [place] [id number]  Basic metabolic panel      Status: Abnormal   Collection Time: 03/03/19  8:51 PM  Result Value Ref Range   Sodium 139 135 - 145 mmol/L   Potassium 4.0 3.5 - 5.1 mmol/L   Chloride 107 98 - 111 mmol/L   CO2 23 22 - 32 mmol/L   Glucose, Bld 107 (H) 70 - 99 mg/dL   BUN 9 6 - 20 mg/dL   Creatinine, Ser 0.73 0.44 - 1.00 mg/dL   Calcium 9.3 8.9 - 10.3 mg/dL   GFR calc non Af Amer >60 >60 mL/min   GFR calc Af Amer >60 >60 mL/min   Anion gap 9 5 - 15    Comment: Performed at [facility], [address] [place],  [address]  Urinalysis, Complete w Microscopic     Status: Abnormal   Collection Time: 03/03/19 10:59 PM  Result Value Ref Range   Color, Urine YELLOW (A) YELLOW   APPearance CLEAR (A) CLEAR   Specific Gravity, Urine 1.006 1.005 - 1.030   pH 7.0 5.0 - 8.0   Glucose, UA NEGATIVE NEGATIVE mg/dL   Hgb urine dipstick NEGATIVE NEGATIVE   Bilirubin Urine NEGATIVE NEGATIVE   Ketones, ur NEGATIVE NEGATIVE mg/dL   Protein, ur NEGATIVE NEGATIVE mg/dL   Nitrite  NEGATIVE NEGATIVE   Leukocytes,Ua NEGATIVE NEGATIVE   WBC, UA 0-5 0 - 5 WBC/hpf   Bacteria, UA NONE SEEN NONE SEEN   Squamous Epithelial / LPF 0-5 0 - 5    Comment: Performed at [facility], [address] [place], [place] [address]  CBC     Status: Abnormal   Collection Time: 03/04/19  6:27 AM  Result Value Ref Range   WBC 13.0 (H) 4.0 - 10.5 K/uL   RBC 3.99 3.87 - 5.11 MIL/uL   Hemoglobin 7.5 (L) 12.0 - 15.0 g/dL    Comment: Reticulocyte Hemoglobin testing may be clinically indicated, consider ordering this additional test [id number]    HCT 26.2 (L) 36.0 - 46.0 %   MCV 65.7 (L) 80.0 - 100.0 fL   MCH 18.8 (L) 26.0 - 34.0 pg   MCHC 28.6 (L) 30.0 - 36.0 g/dL   RDW 23.4 (H) 11.5 - 15.5 %   Platelets 363 150 - 400 K/uL   nRBC 0.4 (H) 0.0 - 0.2 %    Comment: Performed at [facility], [address] [place], [place] [address]  CBC     Status: Abnormal   Collection Time: 03/05/19  5:48 AM  Result Value Ref  Range   WBC 15.3 (H) 4.0 - 10.5 K/uL   RBC 3.90 3.87 - 5.11 MIL/uL   Hemoglobin 7.4 (L) 12.0 - 15.0 g/dL    Comment: Reticulocyte Hemoglobin testing may be clinically indicated, consider ordering this additional test [id number]    HCT 26.1 (L) 36.0 - 46.0 %   MCV 66.9 (L) 80.0 - 100.0 fL   MCH 19.0 (L) 26.0 - 34.0 pg   MCHC 28.4 (L) 30.0 - 36.0 g/dL   RDW 23.9 (H) 11.5 - 15.5 %   Platelets 362 150 - 400 K/uL   nRBC 0.9 (H) 0.0 - 0.2 %    Comment: Performed at [facility], [address] [place], [place] [address]  Cytology - PAP     Status: None   Collection Time: 03/14/19 12:00 AM  Result Value Ref Range   Adequacy      Satisfactory for evaluation  endocervical/transformation zone component PRESENT.   Diagnosis      NEGATIVE FOR INTRAEPITHELIAL LESIONS OR MALIGNANCY.   Material Submitted CervicoVaginal Pap [ThinPrep Imaged]    CYTOLOGY - PAP PAP RESULT   Antithrombin III     Status: None   Collection Time: 03/15/19 11:36 AM  Result Value Ref Range   AntiThromb III Func 120 75 - 120 %    Comment: Performed at [facility] [address], [place], [place] [address]  Protein C, total     Status: None   Collection Time: 03/15/19 11:36 AM  Result Value Ref Range   Protein C, Total 144 60 - 150 %    Comment: (NOTE) Performed At: [facility] [place], [place] [id number] Rush Farmer MD [id number]   Protein S, total     Status: None   Collection Time: 03/15/19 11:36 AM  Result Value Ref Range   Protein S Ag, Total 90 60 - 150 %    Comment: (NOTE) This test was developed and its performance characteristics determined by [facility]. It has not been cleared or approved by the Food and Drug Administration. Performed At: [facility] [place], [place] [id number] Rush Farmer MD [id number]   Beta-2-glycoprotein i abs, IgG/M/A     Status: None   Collection Time:  03/15/19 11:36 AM  Result Value Ref Range    Beta-2 Glyco I IgG <9 0 - 20 GPI IgG units    Comment: (NOTE) The reference interval reflects a 3SD or 99th percentile interval, which is thought to represent a potentially clinically significant result in accordance with the International Consensus Statement on the classification criteria for definitive antiphospholipid syndrome (APS). J Thromb Haem 2006;4:295-306.    Beta-2-Glycoprotein I IgM <9 0 - 32 GPI IgM units    Comment: (NOTE) The reference interval reflects a 3SD or 99th percentile interval, which is thought to represent a potentially clinically significant result in accordance with the International Consensus Statement on the classification criteria for definitive antiphospholipid syndrome (APS). J Thromb Haem 2006;4:295-306. Performed At: [facility] [place], [place] [id number] Rush Farmer MD [id number]    Beta-2-Glycoprotein I IgA <9 0 - 25 GPI IgA units    Comment: (NOTE) The reference interval reflects a 3SD or 99th percentile interval, which is thought to represent a potentially clinically significant result in accordance with the International Consensus Statement on the classification criteria for definitive antiphospholipid syndrome (APS). J Thromb Haem 2006;4:295-306.   Cardiolipin antibodies, IgG, IgM, IgA     Status: None   Collection Time: 03/15/19 11:36 AM  Result Value Ref Range   Anticardiolipin IgG <9 0 - 14 GPL U/mL    Comment: (NOTE)                          Negative:              <15                          Indeterminate:     15 - 20                          Low-Med Positive: >20 - 80                          High Positive:         >80    Anticardiolipin IgM <9 0 - 12 MPL U/mL    Comment: (NOTE)                          Negative:              <13                          Indeterminate:     13 - 20                          Low-Med Positive: >20 - 80                          High Positive:         >80     Anticardiolipin IgA <9 0 - 11 APL U/mL    Comment: (NOTE)                          Negative:              <12  Indeterminate:     12 - 20                          Low-Med Positive: >20 - 80                          High Positive:         >80 Performed At: [facility] [place], [place] [id number] Rush Farmer MD [id number]     Radiology Dg Chest 1 View  Result Date: 03/03/2019 CLINICAL DATA:  Shortness of breath. Right leg pain. EXAM: CHEST  1 VIEW COMPARISON:  CT 05/19/2017, radiograph 02/27/2017 FINDINGS: Unchanged heart size and mediastinal contours. Multifocal regions of scarring and architectural distortion are not significantly changed from prior imaging. No acute airspace disease. No pleural fluid or pneumothorax. Unchanged osseous structures. IMPRESSION: Multifocal regions of scarring are not significantly changed from prior imaging consistent with interstitial lung disease. No superimposed acute abnormality. Electronically Signed   By: Keith Rake M.D.   On: 03/03/2019 01:05   US Transvaginal Non-ob  Result Date: 03/14/2019 Patient Name: Lisa [name] DOB: Mar 11, 1971 MRN: [id number] ULTRASOUND REPORT Location: [facility] Date of Service: 03/14/2019 Indications:Abnormal Uterine Bleeding Findings: The uterus is retroverted and measures 9.3 x 5.8 x 5.4 cm. Echo texture is heterogenous without evidence of focal masses. The Endometrium measures 20.3 mm. Right Ovary measures 3.3 x 2.5 x 1.7 cm. It is normal in appearance. Left Ovary measures 4.5 x 2.2 x 2.2 cm. It is normal in appearance. There is a small complex cyst with blood flow around the periphery only than  Measures 17 x 13 x 13 mm. Survey of the adnexa demonstrates no adnexal masses. There is no free fluid in the cul de sac. Impression: 1. The endometrial lining is thick. Can not rule out a polyp or small fibroid. 2. Normal right ovary. 3. There is a small complex cyst int  he left ovary. Probable corpus luteum. Recommendations: 1.Clinical correlation with the patient's History and Physical Exam. Gweneth Dimitri, RT Images reviewed.  Endometrium thickened in the setting of current provera use.  Small left ovarian cyst most consistent with a hemorrhagic corpus luteum cyst. Malachy Mood, MD, [facility], [facility] 03/14/2019, 9:38 AM   Ct Chest High Resolution  Result Date: 03/13/2019 CLINICAL DATA:  Interstitial lung disease. Chronic shortness of breath and cough. EXAM: CT CHEST WITHOUT CONTRAST TECHNIQUE: Multidetector CT imaging of the chest was performed following the standard protocol without intravenous contrast. High resolution imaging of the lungs, as well as inspiratory and expiratory imaging, was performed. COMPARISON:  05/19/2017. FINDINGS: Cardiovascular: Pulmonic trunk is enlarged. Heart size normal. No pericardial effusion. Mediastinum/Nodes: Mediastinal lymph nodes are not enlarged by CT size criteria. Hilar regions are difficult to evaluate without IV contrast. No axillary adenopathy. Esophagus is unremarkable. Lungs/Pleura: Interstitial thickening, ground-glass and traction bronchiectasis/bronchiolectasis. There is some subpleural involvement as well but it is not a dominant feature. Findings appears similar to 05/19/2017. No air trapping. No pleural fluid. Airway is unremarkable. Upper Abdomen: 1.6 cm low-attenuation lesion in the liver is similar and likely a cyst. Visualized portions of the adrenal glands, right kidney, spleen, pancreas, stomach and bowel are grossly unremarkable. There may be a tiny hiatal hernia. No upper abdominal adenopathy. Musculoskeletal: No worrisome lytic or sclerotic lesions. IMPRESSION: 1. 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 Findings are suggestive of an alternative diagnosis (not UIP) per consensus  guidelines: Diagnosis of Idiopathic Pulmonary Fibrosis: An Official ATS/ERS/JRS/ALAT Clinical Practice Guideline. [place], Iss 5, [phone], Mar 13 2017. 2. Enlarged pulmonic trunk, indicative of pulmonary arterial hypertension. Electronically Signed   By: Lorin Picket M.D.   On: 03/13/2019 11:38   US Venous Img Lower Unilateral Right  Result Date: 03/03/2019 CLINICAL DATA:  Right leg pain for 5 days. EXAM: RIGHT LOWER EXTREMITY VENOUS DOPPLER ULTRASOUND TECHNIQUE: Gray-scale sonography with graded compression, as well as color Doppler and duplex ultrasound were performed to evaluate the lower extremity deep venous systems from the level of the common femoral vein and including the common femoral, femoral, profunda femoral, popliteal and calf veins including the posterior tibial, peroneal and gastrocnemius veins when visible. The superficial great saphenous vein was also interrogated. Spectral Doppler was utilized to evaluate flow at rest and with distal augmentation maneuvers in the common femoral, femoral and popliteal veins. COMPARISON:  None. FINDINGS: Contralateral Common Femoral Vein: Respiratory phasicity is normal and symmetric with the symptomatic side. No evidence of thrombus. Normal compressibility. Common Femoral Vein: No evidence of thrombus. Normal compressibility, respiratory phasicity and response to augmentation. Saphenofemoral Junction: No evidence of thrombus. Normal compressibility and flow on color Doppler imaging. Profunda Femoral Vein: No evidence of thrombus. Normal compressibility and flow on color Doppler imaging. Femoral Vein: No evidence of thrombus. Normal compressibility, respiratory phasicity and response to augmentation. Popliteal Vein: No evidence of thrombus. Normal compressibility, respiratory phasicity and response to augmentation. Calf Veins: Occlusive thrombus in the posterior tibial vein with lack of compressibility. No evidence of thrombus in  the peroneal vein. Superficial Great Saphenous Vein: No evidence of thrombus. Normal compressibility. Venous Reflux:  None. Other Findings: Superficial thrombus in the right mid calf in the area of patient pain. IMPRESSION: 1. Occlusive thrombus in the posterior tibial vein. No thrombus proximal to the knee. 2. Superficial thrombophlebitis in the right mid calf. These results were called by telephone at the time of interpretation on 03/03/2019 at 12:55 am to Dr. Harvest Dark , who verbally acknowledged these results. Electronically Signed   By: Keith Rake M.D.   On: 03/03/2019 00:55    Assessment/Plan  Menorrhagia Being seen by gynecology and going to have an ablation in the near future.  This is particularly important given her need for anticoagulation  Obesity, Class III, BMI 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) Increases her risk of postphlebitic symptoms.  Essential hypertension blood pressure control important in reducing the progression of atherosclerotic disease. On appropriate oral medications.'  DVT (deep venous thrombosis) The patient is on appropriate anticoagulation for recurrent, unprovoked right tibial vein DVT.  If this were provoked or not a recurrent issue, 3 months would be adequate therapy but given her likely hypercoagulable state, longer term anticoagulation should be considered.  I agree with her hematologist hypercoagulable work-up.  This is being done currently.  She is scheduled to see him back in a month or 2 and that will help decide the length of anticoagulation which may be lifelong.  Another option is a lower dose of anticoagulation after she completes her 26-month course which I think would be reasonable as well.  We discussed postphlebitic symptoms and the importance of wearing compression stockings and elevating her legs.  I will see her back in 3 months as well.    Leotis Pain, MD  03/21/2019 10:10 AM  This note was created with Dragon medical  transcription system.  Any errors from dictation are purely unintentional

## 2019-03-21 NOTE — Assessment & Plan Note (Signed)
blood pressure control important in reducing the progression of atherosclerotic disease. On appropriate oral medications.  

## 2019-03-21 NOTE — Progress Notes (Signed)
Obstetrics & Gynecology Surgery H&P    Chief Complaint: Scheduled Surgery   History of Present Illness: Patient is a 48 y.o. No obstetric history on file. presenting for scheduled hysteroscopy and Novasure endometrial ablation, for the treatment or further evaluation of abnormal uterine bleeding and anemia in the setting of anticoagulation.   Prior Treatments prior to proceeding with surgery include: po progestin therapy  Preoperative Pap: 03/14/2019 Results: NIL HPV negative  Preoperative Endometrial biopsy: 03/14/2019 Findings: pathology secretory endometrium Preoperative Ultrasound: 03/14/2019 Findings: thickened endometrium at 51mm, no discrete polyp, consistent with endometrial pathology showing secretory/luteal phase endometrium.  No fibroids .  Review of Systems:10 point review of systems  Past Medical History:  Past Medical History:  Diagnosis Date   Abdominal pain    Anemia    hx of anemic   Asthma    Bipolar 1 disorder ([place])    Blood in stool    Change in voice    Chest pain    Constipation    Depression    Diverticulitis    DVT (deep venous thrombosis) ([place]) 12/13/2014   Dysrhythmia    Fatigue    Gallstones 04/10/2011   with biliary pancreatitis   GERD (gastroesophageal reflux disease)    Migraines    MRSA carrier    Nausea    Pancreatitis    PTSD (post-traumatic stress disorder)    Pulmonary embolism ([place]) 12/13/2014   Sleep apnea    cpap   Trouble swallowing     Past Surgical History:  Past Surgical History:  Procedure Laterality Date   BRAVO [facility] STUDY  06/02/2011   Procedure: BRAVO [place] STUDY;  Surgeon: Inda Castle, MD;  Location: [facility] ENDOSCOPY;  Service: Endoscopy;  Laterality: N/A;   CHOLECYSTECTOMY     DILATION AND CURETTAGE OF UTERUS     ESOPHAGOGASTRODUODENOSCOPY  06/02/2011   Procedure: ESOPHAGOGASTRODUODENOSCOPY (EGD);  Surgeon: Inda Castle, MD;  Location: [facility];  Service: Endoscopy;  Laterality: N/A;    LAPAROSCOPIC CHOLECYSTECTOMY W/ CHOLANGIOGRAPHY  04/25/2011   Dr Margot Chimes   NASAL POLYP SURGERY     polyp from vocal cords   WISDOM TOOTH EXTRACTION      Family History:  Family History  Problem Relation Age of Onset   Diabetes Mother    Pancreatic cancer Mother    Lung cancer Father    Cancer Paternal Aunt breast   Anesthesia problems Neg Hx    Hypotension Neg Hx    Malignant hyperthermia Neg Hx    Pseudochol deficiency Neg Hx     Social History:  Social History   Socioeconomic History   Marital status: Single    Spouse name: Not on file   Number of children: 7   Years of education: Not on file   Highest education level: Not on file  Occupational History   Occupation: Designer, fashion/clothing: Database administrator strain: Not on file   Food insecurity    Worry: Not on file    Inability: Not on file   Transportation needs    Medical: Not on file    Non-medical: Not on file  Tobacco Use   Smoking status: Never Smoker   Smokeless tobacco: Never Used  Substance and Sexual Activity   Alcohol use: Yes    Comment: occ   Drug use: No   Sexual activity: Yes    Birth control/protection: Injection  Lifestyle   Physical activity    Days per  week: Not on file    Minutes per session: Not on file   Stress: Not on file  Relationships   Social connections    Talks on phone: Not on file    Gets together: Not on file    Attends religious service: Not on file    Active member of club or organization: Not on file    Attends meetings of clubs or organizations: Not on file    Relationship status: Not on file   Intimate partner violence    Fear of current or ex partner: Not on file    Emotionally abused: Not on file    Physically abused: Not on file    Forced sexual activity: Not on file  Other Topics Concern   Not on file  Social History Narrative   In [place]; with 2 sons; used to work in hospitality. Never  smoked; ocassional alcohol.     Allergies:  Allergies  Allergen Reactions   Lisinopril Anaphylaxis    Medications: Prior to Admission medications   Medication Sig Start Date End Date Taking? Authorizing Provider  AIMOVIG 140 MG/ML SOAJ Inject 140 mg into the skin every 30 (thirty) days. 02/19/19   [provider]  albuterol (PROVENTIL HFA;VENTOLIN HFA) 108 (90 Base) MCG/ACT inhaler Inhale 2 puffs into the lungs every 6 (six) hours as needed for wheezing or shortness of breath. 02/28/17   Rudene Re, MD  azaTHIOprine (IMURAN) 50 MG tablet Take 50 mg by mouth daily. 02/02/19   [provider]  Chromium 500 MCG TABS Take 500 mcg by mouth daily.    [provider]  Eliquis DVT/PE Starter Pack (ELIQUIS STARTER PACK) 5 MG TABS Take as directed on package: start with two-5mg  tablets twice daily for 7 days. On day 8, switch to one-5mg  tablet twice daily. 03/15/19   Cammie Sickle, MD  Ipratropium-Albuterol (COMBIVENT) 20-100 MCG/ACT AERS respimat Inhale 1 puff into the lungs every 6 (six) hours.    [provider]  medroxyPROGESTERone (PROVERA) 10 MG tablet Take 2 tablets (20 mg total) by mouth daily for 20 days. 03/06/19 03/26/19  Gladstone Lighter, MD  omeprazole (PRILOSEC) 40 MG capsule Take 40 mg by mouth daily. 01/03/19   [provider]  Polysaccharide Iron Complex 15 MG/ML LIQD Take 5 mLs by mouth 2 (two) times daily. 03/05/19   Gladstone Lighter, MD  predniSONE (DELTASONE) 10 MG tablet Take 20 mg by mouth daily.    [provider]  Probiotic Product (ACIDOPHILUS/BIFIDUS PO) Take 1 capsule by mouth daily.    [provider]  propranolol (INDERAL) 20 MG tablet Take 20 mg by mouth 2 (two) times daily. 01/03/19 01/03/20  [provider]  SUMAtriptan (IMITREX) 100 MG tablet TAKE 1 TABLET BY MOUTH ONCE DAILY AS NEEDED FOR MIGRAINE FOR UP TO 1 DOSE MAY TAKE A SECOND DOSE AFTER 2 HOURS IF NEEDED 02/10/19   [provider]    Physical Exam Vitals: Blood pressure 128/64, pulse 74, height 5\' 8"  (1.727 m), weight 274 lb (124.3 kg), last menstrual period 03/14/2019.  General: NAD, appears stated age, well nourished HEENT: normocephalic, anicteric Pulmonary: No increased work of breathing Cardiovascular: RRR, distal pulses 2+ Abdomen: soft, non-tender, non-distended Genitourinary: deferred Extremities: no edema, erythema, or tenderness Neurologic: Grossly intact Psychiatric: mood appropriate, affect full  Imaging Dg Chest 1 View  Result Date: 03/03/2019 CLINICAL DATA:  Shortness of breath. Right leg pain. EXAM: CHEST  1 VIEW COMPARISON:  CT 05/19/2017, radiograph 02/27/2017 FINDINGS: Unchanged  heart size and mediastinal contours. Multifocal regions of scarring and architectural distortion are not significantly changed from prior imaging. No acute airspace disease. No pleural fluid or pneumothorax. Unchanged osseous structures. IMPRESSION: Multifocal regions of scarring are not significantly changed from prior imaging consistent with interstitial lung disease. No superimposed acute abnormality. Electronically Signed   By: Keith Rake M.D.   On: 03/03/2019 01:05   US Transvaginal Non-ob  Result Date: 03/14/2019 Patient Name: Lisa [name] DOB: 1970-10-21 MRN: [id number] ULTRASOUND REPORT Location: [facility] Date of Service: 03/14/2019 Indications:Abnormal Uterine Bleeding Findings: The uterus is retroverted and measures 9.3 x 5.8 x 5.4 cm. Echo texture is heterogenous without evidence of focal masses. The Endometrium measures 20.3 mm. Right Ovary measures 3.3 x 2.5 x 1.7 cm. It is normal in appearance. Left Ovary measures 4.5 x 2.2 x 2.2 cm. It is normal in appearance. There is a small complex cyst with blood flow around the periphery only than  Measures 17 x 13 x 13 mm. Survey of the adnexa demonstrates no adnexal masses. There is no free fluid in the cul de sac. Impression: 1. The  endometrial lining is thick. Can not rule out a polyp or small fibroid. 2. Normal right ovary. 3. There is a small complex cyst int he left ovary. Probable corpus luteum. Recommendations: 1.Clinical correlation with the patient's History and Physical Exam. Gweneth Dimitri, RT Images reviewed.  Endometrium thickened in the setting of current provera use.  Small left ovarian cyst most consistent with a hemorrhagic corpus luteum cyst. Malachy Mood, MD, [place] 03/14/2019, 9:38 AM   Ct Chest High Resolution  Result Date: 03/13/2019 CLINICAL DATA:  Interstitial lung disease. Chronic shortness of breath and cough. EXAM: CT CHEST WITHOUT CONTRAST TECHNIQUE: Multidetector CT imaging of the chest was performed following the standard protocol without intravenous contrast. High resolution imaging of the lungs, as well as inspiratory and expiratory imaging, was performed. COMPARISON:  05/19/2017. FINDINGS: Cardiovascular: Pulmonic trunk is enlarged. Heart size normal. No pericardial effusion. Mediastinum/Nodes: Mediastinal lymph nodes are not enlarged by CT size criteria. Hilar regions are difficult to evaluate without IV contrast. No axillary adenopathy. Esophagus is unremarkable. Lungs/Pleura: Interstitial thickening, ground-glass and traction bronchiectasis/bronchiolectasis. There is some subpleural involvement as well but it is not a dominant feature. Findings appears similar to 05/19/2017. No air trapping. No pleural fluid. Airway is unremarkable. Upper Abdomen: 1.6 cm low-attenuation lesion in the liver is similar and likely a cyst. Visualized portions of the adrenal glands, right kidney, spleen, pancreas, stomach and bowel are grossly unremarkable. There may be a tiny hiatal hernia. No upper abdominal adenopathy. Musculoskeletal: No worrisome lytic or sclerotic lesions. IMPRESSION: 1. 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 Findings are suggestive of an alternative diagnosis (not UIP) per consensus guidelines: Diagnosis of Idiopathic Pulmonary Fibrosis: An Official ATS/ERS/JRS/ALAT Clinical Practice Guideline. [place], Iss 5, [phone], Mar 13 2017. 2. Enlarged pulmonic trunk, indicative of pulmonary arterial hypertension. Electronically Signed   By: Lorin Picket M.D.   On: 03/13/2019 11:38   US Venous Img Lower Unilateral Right  Result Date: 03/03/2019 CLINICAL DATA:  Right leg pain for 5 days. EXAM: RIGHT LOWER EXTREMITY VENOUS DOPPLER ULTRASOUND TECHNIQUE: Gray-scale sonography with graded compression, as well as color Doppler and duplex ultrasound were performed to evaluate the lower extremity deep venous systems from the level of the  common femoral vein and including the common femoral, femoral, profunda femoral, popliteal and calf veins including the posterior tibial, peroneal and gastrocnemius veins when visible. The superficial great saphenous vein was also interrogated. Spectral Doppler was utilized to evaluate flow at rest and with distal augmentation maneuvers in the common femoral, femoral and popliteal veins. COMPARISON:  None. FINDINGS: Contralateral Common Femoral Vein: Respiratory phasicity is normal and symmetric with the symptomatic side. No evidence of thrombus. Normal compressibility. Common Femoral Vein: No evidence of thrombus. Normal compressibility, respiratory phasicity and response to augmentation. Saphenofemoral Junction: No evidence of thrombus. Normal compressibility and flow on color Doppler imaging. Profunda Femoral Vein: No evidence of thrombus. Normal compressibility and flow on color Doppler imaging. Femoral Vein: No evidence of thrombus. Normal compressibility, respiratory phasicity and response to augmentation. Popliteal Vein: No evidence of thrombus. Normal compressibility, respiratory phasicity and response  to augmentation. Calf Veins: Occlusive thrombus in the posterior tibial vein with lack of compressibility. No evidence of thrombus in the peroneal vein. Superficial Great Saphenous Vein: No evidence of thrombus. Normal compressibility. Venous Reflux:  None. Other Findings: Superficial thrombus in the right mid calf in the area of patient pain. IMPRESSION: 1. Occlusive thrombus in the posterior tibial vein. No thrombus proximal to the knee. 2. Superficial thrombophlebitis in the right mid calf. These results were called by telephone at the time of interpretation on 03/03/2019 at 12:55 am to Dr. Harvest Dark , who verbally acknowledged these results. Electronically Signed   By: Keith Rake M.D.   On: 03/03/2019 00:55    Assessment: 48 y.o.  presenting for scheduled hysteroscopy, Novasure endometrial ablation  Plan: 1) The patient was counseled on the overall effectiveness of endometrial ablation in achieving amenorrhea.  She is aware that some patient may continue to have menstrual cycles although these are generally greatly reduced in flow.  In addition she was quoted a failure rate for endometrial ablation of approximately 25% within the frist 4 years, but these failures may happen at any time during or after the initial 4 year postop period.  She is aware that pregnancy is contra-indicated in the setting of prior endometrial ablation, and that ablation itself does not confer any contraceptive benefit.  She will therefore need to continue to rely on some means of contraception following the procedure.  Although rare and generally confined to patient who have undergone prior tubal ligatoin, post-ablation tubal sterilizaton syndrome (PATSS) may also occur with no reliable incidence rates as the majority of published literature is limited to case reports.   Prior to being considered a candidate for Novasure ablation she will need to undergo endometrial biopsy to rule out endometrial hyperplasia or  malignancy as the cause of her bleeding, have an up to date pap on record, and undergo transvaginal ultrasound to verify the absence of focal endometrial lesion which may need to be addressed prior to proceeding with ablation.  In addition she is aware that the device is limited for use in women with a normal uterine cavity and uterine leiomyomata <3cm in size.  If present leiomyomata may increase the long-term failure rate of the procedure.  In rare instances the presence of a uterine septum or arcuate uterus, which may not be readily apparent on preoperative ultrasound, may necessitate the procedure to be aborted.    2) Routine postoperative instructions were reviewed with the patient and her family in detail today including the expected length of recovery and likely postoperative course.  The patient concurred with the proposed  plan, giving informed written consent for the surgery today.  Patient instructed on the importance of being NPO after midnight prior to her procedure.  If warranted preoperative prophylactic antibiotics and SCDs ordered on call to the OR to meet SCIP guidelines and adhere to recommendation laid forth in [place] Number 104 May 2009  "Antibiotic Prophylaxis for Gynecologic Procedures".     Malachy Mood, MD, [facility], [facility] 03/21/2019, 4:05 PM

## 2019-03-21 NOTE — Assessment & Plan Note (Signed)
The patient is on appropriate anticoagulation for recurrent, unprovoked right tibial vein DVT.  If this were provoked or not a recurrent issue, 3 months would be adequate therapy but given her likely hypercoagulable state, longer term anticoagulation should be considered.  I agree with her hematologist hypercoagulable work-up.  This is being done currently.  She is scheduled to see him back in a month or 2 and that will help decide the length of anticoagulation which may be lifelong.  Another option is a lower dose of anticoagulation after she completes her 3-month course which I think would be reasonable as well.  We discussed postphlebitic symptoms and the importance of wearing compression stockings and elevating her legs.  I will see her back in 3 months as well.

## 2019-03-22 ENCOUNTER — Other Ambulatory Visit: Payer: Self-pay

## 2019-03-22 ENCOUNTER — Inpatient Hospital Stay: Payer: Medicare Other

## 2019-03-22 VITALS — BP 110/71 | HR 58 | Temp 97.0°F | Resp 18

## 2019-03-22 DIAGNOSIS — I82431 Acute embolism and thrombosis of right popliteal vein: Secondary | ICD-10-CM | POA: Diagnosis not present

## 2019-03-22 DIAGNOSIS — D500 Iron deficiency anemia secondary to blood loss (chronic): Secondary | ICD-10-CM

## 2019-03-22 LAB — PROTHROMBIN GENE MUTATION

## 2019-03-22 MED ORDER — SODIUM CHLORIDE 0.9 % IV SOLN
510.0000 mg | Freq: Once | INTRAVENOUS | Status: AC
  Administered 2019-03-22: 510 mg via INTRAVENOUS
  Filled 2019-03-22: qty 17

## 2019-03-22 MED ORDER — SODIUM CHLORIDE 0.9 % IV SOLN
Freq: Once | INTRAVENOUS | Status: AC
  Administered 2019-03-22: 14:00:00 via INTRAVENOUS
  Filled 2019-03-22: qty 250

## 2019-03-22 NOTE — Progress Notes (Signed)
Pt tolerated first time Feraheme infusion well with no signs of complications or reaction. Pt was observed for 30 minutes post transfusion. RN made pt aware of calling the clinic if complications arise at home or if it is an emergency to call 911. All questions answered at this time. VSS upon discharge.   Lisa [name] [employer]

## 2019-03-23 ENCOUNTER — Telehealth: Payer: Self-pay | Admitting: Obstetrics and Gynecology

## 2019-03-23 NOTE — Telephone Encounter (Signed)
Patient has been scheduled for an In-Person Pre-admit testing appointment on 03/24/19 @ 11:00am. Patient should arrive at the [facility] information desk and they will direct her to Pre-admit testing. Lmtrc.

## 2019-03-23 NOTE — Telephone Encounter (Signed)
Patient is aware of the Pre-admit testing appointment tomorrow at 11am. Directions were given.

## 2019-03-24 ENCOUNTER — Other Ambulatory Visit: Payer: Self-pay

## 2019-03-24 ENCOUNTER — Encounter
Admission: RE | Admit: 2019-03-24 | Discharge: 2019-03-24 | Disposition: A | Discharge status: Home / Self Care | Payer: Medicare Other | Source: Ambulatory Visit | Attending: Obstetrics and Gynecology | Admitting: Obstetrics and Gynecology

## 2019-03-24 DIAGNOSIS — Z01812 Encounter for preprocedural laboratory examination: Secondary | ICD-10-CM | POA: Insufficient documentation

## 2019-03-24 DIAGNOSIS — N939 Abnormal uterine and vaginal bleeding, unspecified: Secondary | ICD-10-CM | POA: Insufficient documentation

## 2019-03-24 DIAGNOSIS — R0681 Apnea, not elsewhere classified: Secondary | ICD-10-CM | POA: Insufficient documentation

## 2019-03-24 DIAGNOSIS — K219 Gastro-esophageal reflux disease without esophagitis: Secondary | ICD-10-CM | POA: Diagnosis not present

## 2019-03-24 DIAGNOSIS — J45909 Unspecified asthma, uncomplicated: Secondary | ICD-10-CM | POA: Insufficient documentation

## 2019-03-24 DIAGNOSIS — D649 Anemia, unspecified: Secondary | ICD-10-CM | POA: Diagnosis not present

## 2019-03-24 DIAGNOSIS — Z86711 Personal history of pulmonary embolism: Secondary | ICD-10-CM | POA: Diagnosis not present

## 2019-03-24 DIAGNOSIS — F319 Bipolar disorder, unspecified: Secondary | ICD-10-CM | POA: Insufficient documentation

## 2019-03-24 DIAGNOSIS — Z86718 Personal history of other venous thrombosis and embolism: Secondary | ICD-10-CM | POA: Diagnosis not present

## 2019-03-24 DIAGNOSIS — Z79899 Other long term (current) drug therapy: Secondary | ICD-10-CM | POA: Diagnosis not present

## 2019-03-24 DIAGNOSIS — Z7901 Long term (current) use of anticoagulants: Secondary | ICD-10-CM | POA: Diagnosis not present

## 2019-03-24 HISTORY — DX: Other specified health status: Z78.9

## 2019-03-24 HISTORY — DX: Essential (primary) hypertension: I10

## 2019-03-24 LAB — CBC WITH DIFFERENTIAL/PLATELET
Abs Immature Granulocytes: 0.05 10*3/uL (ref 0.00–0.07)
Basophils Absolute: 0.1 10*3/uL (ref 0.0–0.1)
Basophils Relative: 1 %
Eosinophils Absolute: 0.2 10*3/uL (ref 0.0–0.5)
Eosinophils Relative: 1 %
HCT: 34.6 % — ABNORMAL LOW (ref 36.0–46.0)
Hemoglobin: 10.2 g/dL — ABNORMAL LOW (ref 12.0–15.0)
Immature Granulocytes: 0 %
Lymphocytes Relative: 23 %
Lymphs Abs: 3 10*3/uL (ref 0.7–4.0)
MCH: 22 pg — ABNORMAL LOW (ref 26.0–34.0)
MCHC: 29.5 g/dL — ABNORMAL LOW (ref 30.0–36.0)
MCV: 74.7 fL — ABNORMAL LOW (ref 80.0–100.0)
Monocytes Absolute: 1.1 10*3/uL — ABNORMAL HIGH (ref 0.1–1.0)
Monocytes Relative: 9 %
Neutro Abs: 8.6 10*3/uL — ABNORMAL HIGH (ref 1.7–7.7)
Neutrophils Relative %: 66 %
Platelets: 410 10*3/uL — ABNORMAL HIGH (ref 150–400)
RBC: 4.63 MIL/uL (ref 3.87–5.11)
RDW: 32.2 % — ABNORMAL HIGH (ref 11.5–15.5)
Smear Review: NORMAL
WBC: 13.1 10*3/uL — ABNORMAL HIGH (ref 4.0–10.5)
nRBC: 0 % (ref 0.0–0.2)

## 2019-03-24 NOTE — Patient Instructions (Signed)
INSTRUCTIONS FOR SURGERY     Your surgery is scheduled for:   Thursday, March 30, 2019     To find out your arrival time for the day of surgery,          please call [phone] between 1 pm and 3 pm on :  Wednesday, March 29, 2019     When you arrive for surgery, report to the [facility].       Do NOT stop on the first floor to register.    REMEMBER: Instructions that are not followed completely may result in serious medical risk,  up to and including death, or upon the discretion of your surgeon and anesthesiologist,            your surgery may need to be rescheduled.  __X__ 1. Do not eat food after midnight the night before your procedure.                    No gum, candy, lozenger, tic tacs, tums or hard candies.                  ABSOLUTELY NOTHING SOLID IN YOUR MOUTH AFTER MIDNIGHT                    You may drink unlimited clear liquids up to 2 hours before you are scheduled to arrive for surgery.                   Do not drink anything within those 2 hours unless you need to take medicine, then take the                   smallest amount you need.  Clear liquids include:  water, apple juice without pulp,                   any flavor Gatorade, Black coffee, black tea.  Sugar may be added but no dairy/ honey /lemon.                        Broth and jello is not considered a clear liquid.  __x__  2. On the morning of surgery, please brush your teeth with toothpaste and water. You may rinse with                  mouthwash if you wish but DO NOT SWALLOW TOOTHPASTE OR MOUTHWASH  __X___3. NO alcohol for 24 hours before or after surgery.  __x___ 4.  Do NOT smoke or use e-cigarettes for 24 HOURS PRIOR TO SURGERY.                      DO NOT Use any chewable tobacco products for at least 6 hours prior to surgery.  __x___ 5. If you start any new medication after this appointment and prior to surgery,  please                   Bring it with you on the day of surgery.  ___x__ 6. Notify your doctor if there is any  change in your medical condition, such as fever, infection, vomitting,                   Diarrhea or any open sores.  __x___ 7.  USE  antibacterial SOAP as instructed, the night before surgery and the day of surgery.                   Once you have washed with this soap, do NOT use any of the following: Powders, perfumes                    or lotions. Please do not wear make up, hairpins, clips or nail polish. You MAY wear deodorant.                   Men may shave their face and neck.  Women need to shave 48 hours prior to surgery.                   DO NOT wear ANY jewelry on the day of surgery. If there are rings that are too tight to                    remove easily, please address this prior to the surgery day. Piercings need to be removed.                                                                     NO METAL ON YOUR BODY.                    Do NOT bring any valuables.  If you came to Pre-Admit testing then you will not need license,                     insurance card or credit card.  If you will be staying overnight, please either leave your things in                     the car or have your family be responsible for these items.                     [facility] IS NOT RESPONSIBLE FOR BELONGINGS OR VALUABLES.  ___X__ 8. DO NOT wear contact lenses on surgery day.  You may not have dentures,                     Hearing aides, contacts or glasses in the operating room. These items can be                    Placed in the Recovery Room to receive immediately after surgery.  __x___ 9. IF YOU ARE SCHEDULED TO GO HOME ON THE SAME DAY, YOU MUST                   Have someone to drive you home and to stay with you  for the first 24 hours.                    Have an arrangement prior to arriving on surgery day.  ___x__ 10. Take the following  medications on the morning of surgery  with a sip of water:                              1. combivent                     2. proventil                      3. prilosec (take an extra dose before going to bed on Wednesday)                     4. inderal                     5. prednisone                     6. Imuran                     7. provera  _____ 11.  Follow any instructions provided to you by your surgeon.                        Such as enema, clear liquid bowel prep  __X__  12. STOP   ELIQUIS AS OF:  Day of surgery                       THIS INCLUDES BC POWDERS / GOODIES POWDER  __x___ 13. STOP Anti-inflammatories as of:  today                      This includes IBUPROFEN / MOTRIN / ADVIL / ALEVE/ NAPROXYN                    YOU MAY TAKE TYLENOL ANY TIME PRIOR TO SURGERY.  __x___ 14.  Stop supplements until after surgery.                     This includes:  Probiotic / chromium                  You may continue taking Vitamin B12 / Vitamin D3 but do not take on the morning of surgery.  _____ 15. Bring your CPAP machine into preop with you on the morning of surgery.  ______17.  Continue to take the following medications but do not take on the morning of surgery:                      ELIQUIS / FERROUS SULFATE   ______18. If staying overnight, please have appropriate shoes to wear to be able to walk around the unit.                   Wear clean and comfortable clothing to the hospital.   IF NEED TO TAKE AMOVIG FOR A MIGRAINE ON THE DAY OF SURGERY, YOU MAY DO SO.   IF YOU COMPLETE THE ADVANCE DIRECTIVES AND HAVE IT NOTARIZED, THEN PLEASE BRING     WITH YOU ON THE DAY OF SURGERY SO WE CAN MAKE A COPY FOR YOUR CHART  RETURN TO THE MEDICAL MALL ON September 14TH FOR COVID TESTING

## 2019-03-27 ENCOUNTER — Other Ambulatory Visit: Payer: Self-pay

## 2019-03-27 ENCOUNTER — Other Ambulatory Visit
Admission: RE | Admit: 2019-03-27 | Discharge: 2019-03-27 | Disposition: A | Discharge status: Home / Self Care | Payer: Medicare Other | Source: Ambulatory Visit | Attending: Obstetrics and Gynecology | Admitting: Obstetrics and Gynecology

## 2019-03-27 DIAGNOSIS — Z01812 Encounter for preprocedural laboratory examination: Secondary | ICD-10-CM | POA: Insufficient documentation

## 2019-03-27 DIAGNOSIS — Z20828 Contact with and (suspected) exposure to other viral communicable diseases: Secondary | ICD-10-CM | POA: Diagnosis not present

## 2019-03-28 ENCOUNTER — Other Ambulatory Visit: Payer: Self-pay

## 2019-03-28 LAB — SARS CORONAVIRUS 2 (TAT 6-24 HRS): SARS Coronavirus 2: NEGATIVE

## 2019-03-29 ENCOUNTER — Other Ambulatory Visit: Payer: Self-pay

## 2019-03-29 ENCOUNTER — Inpatient Hospital Stay: Payer: Medicare Other

## 2019-03-29 NOTE — Progress Notes (Unsigned)
Unable to obtain IV access. Patient to return for next week's appointment as scheduled.

## 2019-03-30 ENCOUNTER — Ambulatory Visit: Payer: Medicare Other | Admitting: Anesthesiology

## 2019-03-30 ENCOUNTER — Encounter
Admission: RE | Disposition: A | Discharge status: Home / Self Care | Payer: Self-pay | Source: Home / Self Care | Attending: Obstetrics and Gynecology

## 2019-03-30 ENCOUNTER — Encounter: Payer: Self-pay | Admitting: *Deleted

## 2019-03-30 ENCOUNTER — Ambulatory Visit
Admission: RE | Admit: 2019-03-30 | Discharge: 2019-03-30 | Disposition: A | Discharge status: Home / Self Care | Payer: Medicare Other | Attending: Obstetrics and Gynecology | Admitting: Obstetrics and Gynecology

## 2019-03-30 DIAGNOSIS — D649 Anemia, unspecified: Secondary | ICD-10-CM | POA: Diagnosis not present

## 2019-03-30 DIAGNOSIS — N920 Excessive and frequent menstruation with regular cycle: Secondary | ICD-10-CM | POA: Diagnosis present

## 2019-03-30 DIAGNOSIS — I10 Essential (primary) hypertension: Secondary | ICD-10-CM | POA: Insufficient documentation

## 2019-03-30 DIAGNOSIS — G43909 Migraine, unspecified, not intractable, without status migrainosus: Secondary | ICD-10-CM | POA: Diagnosis not present

## 2019-03-30 DIAGNOSIS — J45909 Unspecified asthma, uncomplicated: Secondary | ICD-10-CM | POA: Insufficient documentation

## 2019-03-30 DIAGNOSIS — Z9889 Other specified postprocedural states: Secondary | ICD-10-CM

## 2019-03-30 DIAGNOSIS — Z86711 Personal history of pulmonary embolism: Secondary | ICD-10-CM | POA: Diagnosis not present

## 2019-03-30 DIAGNOSIS — Z86718 Personal history of other venous thrombosis and embolism: Secondary | ICD-10-CM | POA: Diagnosis not present

## 2019-03-30 DIAGNOSIS — G4730 Sleep apnea, unspecified: Secondary | ICD-10-CM | POA: Insufficient documentation

## 2019-03-30 DIAGNOSIS — K219 Gastro-esophageal reflux disease without esophagitis: Secondary | ICD-10-CM | POA: Insufficient documentation

## 2019-03-30 DIAGNOSIS — Z7952 Long term (current) use of systemic steroids: Secondary | ICD-10-CM | POA: Diagnosis not present

## 2019-03-30 DIAGNOSIS — Z22322 Carrier or suspected carrier of Methicillin resistant Staphylococcus aureus: Secondary | ICD-10-CM | POA: Insufficient documentation

## 2019-03-30 DIAGNOSIS — Z6841 Body Mass Index (BMI) 40.0 and over, adult: Secondary | ICD-10-CM | POA: Diagnosis not present

## 2019-03-30 HISTORY — PX: ENDOMETRIAL ABLATION: SHX621

## 2019-03-30 HISTORY — PX: HYSTEROSCOPY: SHX211

## 2019-03-30 LAB — POCT PREGNANCY, URINE: Preg Test, Ur: NEGATIVE

## 2019-03-30 SURGERY — ABLATION, ENDOMETRIUM
Anesthesia: General

## 2019-03-30 MED ORDER — ROCURONIUM BROMIDE 50 MG/5ML IV SOLN
INTRAVENOUS | Status: AC
  Filled 2019-03-30: qty 1

## 2019-03-30 MED ORDER — ROCURONIUM BROMIDE 100 MG/10ML IV SOLN
INTRAVENOUS | Status: DC | PRN
  Administered 2019-03-30: 20 mg via INTRAVENOUS

## 2019-03-30 MED ORDER — DEXAMETHASONE SODIUM PHOSPHATE 10 MG/ML IJ SOLN
INTRAMUSCULAR | Status: AC
  Filled 2019-03-30: qty 1

## 2019-03-30 MED ORDER — MIDAZOLAM HCL 2 MG/2ML IJ SOLN
INTRAMUSCULAR | Status: AC
  Filled 2019-03-30: qty 2

## 2019-03-30 MED ORDER — IBUPROFEN 600 MG PO TABS: 600.0000 mg | ORAL_TABLET | Freq: Four times a day (QID) | ORAL | 3 refills | Status: DC | PRN

## 2019-03-30 MED ORDER — PROPOFOL 500 MG/50ML IV EMUL
INTRAVENOUS | Status: AC
  Filled 2019-03-30: qty 50

## 2019-03-30 MED ORDER — PROPOFOL 10 MG/ML IV BOLUS
INTRAVENOUS | Status: DC | PRN
  Administered 2019-03-30: 200 mg via INTRAVENOUS

## 2019-03-30 MED ORDER — FENTANYL CITRATE (PF) 100 MCG/2ML IJ SOLN
INTRAMUSCULAR | Status: DC | PRN
  Administered 2019-03-30 (×2): 50 ug via INTRAVENOUS

## 2019-03-30 MED ORDER — MIDAZOLAM HCL 2 MG/2ML IJ SOLN
INTRAMUSCULAR | Status: DC | PRN
  Administered 2019-03-30: 2 mg via INTRAVENOUS

## 2019-03-30 MED ORDER — OXYCODONE HCL 5 MG/5ML PO SOLN: 5.0000 mg | Freq: Once | ORAL | Status: AC | PRN

## 2019-03-30 MED ORDER — HYDROCODONE-ACETAMINOPHEN 5-325 MG PO TABS: 1.0000 | ORAL_TABLET | Freq: Four times a day (QID) | ORAL | 0 refills | Status: DC | PRN

## 2019-03-30 MED ORDER — SUGAMMADEX SODIUM 500 MG/5ML IV SOLN
INTRAVENOUS | Status: DC | PRN
  Administered 2019-03-30: 250 mg via INTRAVENOUS

## 2019-03-30 MED ORDER — OXYCODONE HCL 5 MG PO TABS
5.0000 mg | ORAL_TABLET | Freq: Once | ORAL | Status: AC | PRN
  Administered 2019-03-30: 5 mg via ORAL

## 2019-03-30 MED ORDER — ONDANSETRON HCL 4 MG/2ML IJ SOLN
INTRAMUSCULAR | Status: DC | PRN
  Administered 2019-03-30: 4 mg via INTRAVENOUS

## 2019-03-30 MED ORDER — OXYCODONE HCL 5 MG PO TABS
ORAL_TABLET | ORAL | Status: AC
  Filled 2019-03-30: qty 1

## 2019-03-30 MED ORDER — FENTANYL CITRATE (PF) 100 MCG/2ML IJ SOLN
INTRAMUSCULAR | Status: AC
  Administered 2019-03-30: 25 ug via INTRAVENOUS
  Filled 2019-03-30: qty 2

## 2019-03-30 MED ORDER — KETOROLAC TROMETHAMINE 30 MG/ML IJ SOLN
INTRAMUSCULAR | Status: AC
  Administered 2019-03-30: 30 mg via INTRAVENOUS
  Filled 2019-03-30: qty 1

## 2019-03-30 MED ORDER — FENTANYL CITRATE (PF) 100 MCG/2ML IJ SOLN
INTRAMUSCULAR | Status: AC
  Filled 2019-03-30: qty 2

## 2019-03-30 MED ORDER — SUCCINYLCHOLINE CHLORIDE 20 MG/ML IJ SOLN
INTRAMUSCULAR | Status: AC
  Filled 2019-03-30: qty 1

## 2019-03-30 MED ORDER — LIDOCAINE HCL (PF) 2 % IJ SOLN
INTRAMUSCULAR | Status: AC
  Filled 2019-03-30: qty 10

## 2019-03-30 MED ORDER — LIDOCAINE HCL (CARDIAC) PF 100 MG/5ML IV SOSY
PREFILLED_SYRINGE | INTRAVENOUS | Status: DC | PRN
  Administered 2019-03-30: 100 mg via INTRAVENOUS

## 2019-03-30 MED ORDER — SUCCINYLCHOLINE CHLORIDE 20 MG/ML IJ SOLN
INTRAMUSCULAR | Status: DC | PRN
  Administered 2019-03-30: 120 mg via INTRAVENOUS

## 2019-03-30 MED ORDER — LACTATED RINGERS IV SOLN
INTRAVENOUS | Status: DC
  Administered 2019-03-30: 09:00:00 via INTRAVENOUS

## 2019-03-30 MED ORDER — DEXAMETHASONE SODIUM PHOSPHATE 10 MG/ML IJ SOLN
INTRAMUSCULAR | Status: DC | PRN
  Administered 2019-03-30: 10 mg via INTRAVENOUS

## 2019-03-30 MED ORDER — SUGAMMADEX SODIUM 500 MG/5ML IV SOLN
INTRAVENOUS | Status: AC
  Filled 2019-03-30: qty 5

## 2019-03-30 MED ORDER — FENTANYL CITRATE (PF) 100 MCG/2ML IJ SOLN
25.0000 ug | INTRAMUSCULAR | Status: DC | PRN
  Administered 2019-03-30 (×4): 25 ug via INTRAVENOUS

## 2019-03-30 MED ORDER — ONDANSETRON HCL 4 MG/2ML IJ SOLN
INTRAMUSCULAR | Status: AC
  Filled 2019-03-30: qty 2

## 2019-03-30 MED ORDER — KETOROLAC TROMETHAMINE 30 MG/ML IJ SOLN
30.0000 mg | Freq: Once | INTRAMUSCULAR | Status: AC
  Administered 2019-03-30: 11:00:00 30 mg via INTRAVENOUS

## 2019-03-30 SURGICAL SUPPLY — 15 items
ABLATOR SURESOUND NOVASURE (ABLATOR) ×3 IMPLANT
CATH ROBINSON RED A/P 16FR (CATHETERS) ×3 IMPLANT
COVER WAND RF STERILE (DRAPES) ×3 IMPLANT
GLOVE BIO SURGEON STRL SZ7 (GLOVE) ×6 IMPLANT
GOWN STRL REUS W/ TWL LRG LVL3 (GOWN DISPOSABLE) ×4 IMPLANT
GOWN STRL REUS W/TWL LRG LVL3 (GOWN DISPOSABLE) ×6
IV LACTATED RINGERS 1000ML (IV SOLUTION) ×3 IMPLANT
KIT TURNOVER CYSTO (KITS) ×3 IMPLANT
NS IRRIG 500ML POUR BTL (IV SOLUTION) ×3 IMPLANT
PACK DNC HYST (MISCELLANEOUS) ×3 IMPLANT
PAD OB MATERNITY 4.3X12.25 (PERSONAL CARE ITEMS) ×3 IMPLANT
PAD PREP 24X41 OB/GYN DISP (PERSONAL CARE ITEMS) ×3 IMPLANT
SEAL ROD LENS SCOPE MYOSURE (ABLATOR) ×3 IMPLANT
TOWEL OR 17X26 4PK STRL BLUE (TOWEL DISPOSABLE) ×3 IMPLANT
TUBING CONNECTING 10 (TUBING) ×3 IMPLANT

## 2019-03-30 NOTE — Op Note (Signed)
Preoperative Diagnosis: 1) 48 y.o. with menorrhagia to anemia 2) Requiring anticoagulation for recurrent DVT  Postoperative Diagnosis: 1) 48 y.o. with menorrhagia to anemia 2) Requiring anticoagulation for recurrent DVT  Operation Performed: Hysteroscopy, novasure endometrial ablation  Indication: The patient was previously counseled on the overall effectiveness of the device in achieving amenorrhea.  She is aware that some patient may continue to have menstrual cycles although these are generally greatly reduced in flow.  In addition she was quoted a failure rate for endometrial ablation of approximately 25% within the frist 4 years, but these failures may happen at any time during or after the initial 4 year postop period.  She is aware that pregnancy is contra-indicated in the setting of prior endometrial ablation, and that ablation itself does not confer and contraceptive benefits and that she will need to continue to rely on some means of contraception following the procedure.  Although rare and generally confined to patient who have undergone prior tubal ligatoin, post-ablation tubal sterilizaton syndrome (PATSS) may also occur.  Preoperative ultrasound, pap, and endometrial biopsy without contraindications for ablation.  The patient was maintained on her anticoagulation pre procedure.    Surgeon: Malachy Mood, MD  Anesthesia: General  Preoperative Antibiotics: none  Estimated Blood Loss: minimal  Urine Output:: 74mL  Drains or Tubes: none  Implants: none  Specimens Removed: None  Complications: none  Intraoperative Findings: Normal appearing vagina mucosa and cervix.  Normal uterine cavity contour, without myometrial or endometrial defects.  A small amount of clot was noted in the uterine cavity and evacuated during the initial hysteroscopy.  Sounding length 9cm, cervical length 4.5cm, and uterine width 4.8cm.  After passing cavity assessment the Novasure device was activated  at a power of 119 with the cycle self completing at 1 min 25 seconds Good coverage of the uterine cavity on post-tablation hysteroscopy with ablation of the uterine cornua and sparing of the endocervix.   Patient Condition: stable  Procedure in Detail:  INDINGS: Exam under anesthesia revealed small, mobile uterus with no masses and bilateral adnexa without masses or fullness. Hysteroscopy revealed no evidence of polyp, otherwise grossly normal appearing uterine cavity with bilateral tubal ostia and normal appearing endocervical canal. Findings after ablation revealed globally ablated endometrium minus the left cornu.    PROCEDURE IN DETAIL: After informed consent was obtained, the patient was taken to the operating room where anesthesia was obtained without difficulty. The patient was positioned in the dorsal lithotomy position using Allen stirrups. The patient's bladder was decompressed using a red rubber catheter. The patient was examined under anesthesia, with the above noted findings.  An operative speculum was placed inside the patient's vagina, the cervix was adequately visualized, and the the anterior lip of the cervix was grasped with a single tooth tenaculum. The uterine cavity was sounded to 9 cm, and then the cervix was progressively dilated to  83mm using Pratt dilators. The 0 degree hysteroscope was introduced through the cervix and advanced under direct visualization in the uterine cavity.  Normal saline was used as the distending fluid during the hysteroscopy.  This revealed the above noted intracavitary findings. The cervical length was obtained by retracting the hysteroscope to the level of the internal cervical os and marking that point on the hysteroscopy.  This measurement yielded a value of 4.5cm, yielding a total cavity length of 4.5cm based on the previously obtained sounding measurement  Curettage was not performed given full anticoagulation and normal preoperative endometrial  biopsy.   The  NovaSure device was then placed without difficulty. The Novasure array was deployed and seated, with a resulting uterine width of 4.8cm. The NovaSure device passed the cavity assessment test.  Following this the device was activaed at a power of with a total ablation time of 1 min 25 seconds. The NovaSure device was removed and repeat hysteroscopy reveals an appropriate lining of the uterus and no perforation or injury. Hysteroscope is removed with minimal discrepancy of fluid.   Tenaculum was removed with excellent hemostasis noted after application of silver nitrate to the tenaculum entry sites. She was then taken out of dorsal lithotomy. Hemostasis noted.  The patient tolerated the procedure well. Sponge, lap and needle counts were correct times two. The patient was taken to recovery room in excellent condition.

## 2019-03-30 NOTE — Anesthesia Preprocedure Evaluation (Addendum)
Anesthesia Evaluation  Patient identified by MRN, date of birth, ID band Patient awake    Reviewed: Allergy & Precautions, H&P , NPO status , Patient's Chart, lab work & pertinent test results  Airway Mallampati: III  TM Distance: >3 FB Neck ROM: full    Dental  (+) Teeth Intact   Pulmonary asthma , sleep apnea (does not use CPAP) ,  "interstitial PNA" started 5 years ago, wears 2 L O2 at night, followed by pulmonology          Cardiovascular hypertension, + dysrhythmias ("irregular heart beat", does not see a cardiologist)      Neuro/Psych  Headaches, PSYCHIATRIC DISORDERS Anxiety Depression Bipolar Disorder Schizophrenia    GI/Hepatic Neg liver ROS, GERD  Poorly Controlled,  Endo/Other  Morbid obesity  Renal/GU      Musculoskeletal   Abdominal   Peds  Hematology  (+) Blood dyscrasia, anemia , H/o PE   Anesthesia Other Findings Past Medical History: No date: Abdominal pain     Comment:  d/t ongoing menstrual bleeding No date: Anemia     Comment:  iron deficiency, receiving Feraheme transfusions No date: Asthma     Comment:  nonspecific interstitial pneumonia.Marland Kitchendiagnosed 6 years               ago No date: Bipolar 1 disorder ([place]) No date: Blood in stool     Comment:  happens often but unsure of etiology.  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 No date: Nausea No date: Pancreatitis No  date: PTSD (post-traumatic stress disorder) 12/13/2014: Pulmonary embolism ([place]) No date: Sleep apnea     Comment:  does not use cpap now.  will be retested soon No date: Takes iron supplements     Comment:  FERAHEME weekly transfusions as well as ferrous sulfate               PO No date: Trouble swallowing  Past Surgical History: 06/02/2011: BRAVO [place] STUDY     Comment:  Procedure: BRAVO [place];  Surgeon: Inda Castle,               MD;  Location: [facility];  Service: Endoscopy;                Laterality: N/A; No date: CHOLECYSTECTOMY No date: DILATION AND CURETTAGE OF UTERUS 06/02/2011: ESOPHAGOGASTRODUODENOSCOPY     Comment:  Procedure: ESOPHAGOGASTRODUODENOSCOPY (EGD);  Surgeon:               Inda Castle, MD;  Location: [facility];  Service:               Endoscopy;  Laterality: N/A; 04/25/2011: LAPAROSCOPIC CHOLECYSTECTOMY W/ CHOLANGIOGRAPHY     Comment:  Dr Margot Chimes No date: NASAL POLYP SURGERY     Comment:  polyp from vocal cords 1996: TUBAL LIGATION No date: WISDOM TOOTH EXTRACTION  Reproductive/Obstetrics negative OB ROS                            Anesthesia Physical Anesthesia Plan  ASA: III  Anesthesia Plan: General ETT   Post-op Pain Management:    Induction: Rapid sequence  PONV Risk Score and Plan: Ondansetron, Dexamethasone, Midazolam and Treatment may vary due to age or medical condition  Airway Management Planned: Video Laryngoscope Planned  Additional Equipment:   Intra-op Plan:   Post-operative Plan:   Informed Consent: I have reviewed the patients History and Physical, chart, labs and discussed the procedure including the risks, benefits and alternatives for the proposed anesthesia with the patient or authorized representative who has indicated his/her understanding and acceptance.     Dental Advisory Given  Plan Discussed with: Anesthesiologist and CRNA  Anesthesia Plan Comments:         Anesthesia Quick Evaluation

## 2019-03-30 NOTE — Transfer of Care (Signed)
Immediate Anesthesia Transfer of Care Note  Patient: Lisa [name]  Procedure(s) Performed: ENDOMETRIAL ABLATION (N/A )  Patient Location: PACU  Anesthesia Type:General  Level of Consciousness: awake  Airway & Oxygen Therapy: Patient connected to face mask oxygen  Post-op Assessment: Post -op Vital signs reviewed and stable  Post vital signs: stable  Last Vitals:  Vitals Value Taken Time  BP 130/98 03/30/19 1011  Temp    Pulse 54 03/30/19 1011  Resp 17 03/30/19 1011  SpO2 100 % 03/30/19 1011  Vitals shown include unvalidated device data.  Last Pain:  Vitals:   03/30/19 0718  TempSrc: Tympanic         Complications: No apparent anesthesia complications

## 2019-03-30 NOTE — H&P (Signed)
Date of Initial H&P: 03/21/2019  History reviewed, patient examined, no change in status, stable for surgery.

## 2019-03-30 NOTE — Anesthesia Procedure Notes (Signed)
Procedure Name: Intubation Date/Time: 03/30/2019 9:24 AM Performed by: Aline Brochure, CRNA Pre-anesthesia Checklist: Patient identified, Emergency Drugs available, Suction available and Patient being monitored Patient Re-evaluated:Patient Re-evaluated prior to induction Oxygen Delivery Method: Circle system utilized Preoxygenation: Pre-oxygenation with 100% oxygen Induction Type: IV induction Ventilation: Mask ventilation without difficulty Laryngoscope Size: McGraph and 3 Grade View: Grade I Tube type: Oral Tube size: 7.0 mm Number of attempts: 1 Airway Equipment and Method: Stylet and Video-laryngoscopy Placement Confirmation: ETT inserted through vocal cords under direct vision,  positive ETCO2 and breath sounds checked- equal and bilateral Secured at: 21 cm Tube secured with: Tape Dental Injury: Teeth and Oropharynx as per pre-operative assessment

## 2019-03-30 NOTE — Anesthesia Post-op Follow-up Note (Signed)
Anesthesia QCDR form completed.        

## 2019-03-31 NOTE — Anesthesia Postprocedure Evaluation (Signed)
Anesthesia Post Note  Patient: Lisa [name]  Procedure(s) Performed: ENDOMETRIAL ABLATION (N/A ) HYSTEROSCOPY  Patient location during evaluation: PACU Anesthesia Type: General Level of consciousness: awake and alert Pain management: pain level controlled Vital Signs Assessment: post-procedure vital signs reviewed and stable Respiratory status: spontaneous breathing, nonlabored ventilation and respiratory function stable Cardiovascular status: blood pressure returned to baseline and stable Postop Assessment: no apparent nausea or vomiting Anesthetic complications: no     Last Vitals:  Vitals:   03/30/19 1126 03/30/19 1147  BP:  137/73  Pulse: (!) 46 (!) 57  Resp: 14 18  Temp:  36.7 C  SpO2: 100% 99%    Last Pain:  Vitals:   03/30/19 1147  TempSrc: Temporal  PainSc: 3                  Durenda Hurt

## 2019-04-04 ENCOUNTER — Other Ambulatory Visit: Payer: Self-pay

## 2019-04-05 ENCOUNTER — Inpatient Hospital Stay: Payer: Medicare Other

## 2019-04-05 ENCOUNTER — Other Ambulatory Visit: Payer: Self-pay

## 2019-04-05 VITALS — BP 104/65 | HR 49 | Resp 18

## 2019-04-05 DIAGNOSIS — I82431 Acute embolism and thrombosis of right popliteal vein: Secondary | ICD-10-CM | POA: Diagnosis not present

## 2019-04-05 DIAGNOSIS — D500 Iron deficiency anemia secondary to blood loss (chronic): Secondary | ICD-10-CM

## 2019-04-05 MED ORDER — SODIUM CHLORIDE 0.9 % IV SOLN
Freq: Once | INTRAVENOUS | Status: AC
  Administered 2019-04-05: 14:00:00 via INTRAVENOUS
  Filled 2019-04-05: qty 250

## 2019-04-05 MED ORDER — SODIUM CHLORIDE 0.9 % IV SOLN
510.0000 mg | Freq: Once | INTRAVENOUS | Status: AC
  Administered 2019-04-05: 510 mg via INTRAVENOUS
  Filled 2019-04-05: qty 17

## 2019-04-06 ENCOUNTER — Ambulatory Visit (INDEPENDENT_AMBULATORY_CARE_PROVIDER_SITE_OTHER): Payer: Medicare Other | Admitting: Obstetrics and Gynecology

## 2019-04-06 ENCOUNTER — Encounter: Payer: Self-pay | Admitting: Obstetrics and Gynecology

## 2019-04-06 VITALS — BP 112/84 | HR 52 | Wt 275.0 lb

## 2019-04-06 DIAGNOSIS — Z4889 Encounter for other specified surgical aftercare: Secondary | ICD-10-CM

## 2019-04-06 NOTE — Progress Notes (Signed)
      Postoperative Follow-up Patient presents post op from novasure endometrial ablation 1weeks ago for abnormal uterine bleeding.  Subjective: Patient reports some improvement in her preop symptoms. Eating a regular diet without difficulty. The patient is not having any pain.  Activity: normal activities of daily living.  Objective: Blood pressure 112/84, pulse (!) 52, weight 275 lb (124.7 kg), last menstrual period 03/30/2019.  Admission on 03/30/2019, Discharged on 03/30/2019  Component Date Value Ref Range Status  . Preg Test, Ur 03/30/2019 NEGATIVE  NEGATIVE Final   Comment:        THE SENSITIVITY OF THIS METHODOLOGY IS >24 mIU/mL     Assessment: 48 y.o. s/p novasure endometrial ablatoin stable  Plan: Patient has done well after surgery with no apparent complications.  I have discussed the post-operative course to date, and the expected progress moving forward.  The patient understands what complications to be concerned about.  I will see the patient in routine follow up, or sooner if needed.    Activity plan: no intercourse, tampons, or baths  Return in about 5 weeks (around 05/11/2019) for Postop.    Malachy Mood, MD, [facility], [facility] 04/06/2019, 11:02 AM

## 2019-04-11 ENCOUNTER — Other Ambulatory Visit: Payer: Self-pay

## 2019-04-12 ENCOUNTER — Inpatient Hospital Stay: Payer: Medicare Other

## 2019-04-12 ENCOUNTER — Other Ambulatory Visit: Payer: Self-pay

## 2019-04-12 VITALS — BP 120/77 | HR 69 | Temp 98.0°F | Resp 18

## 2019-04-12 DIAGNOSIS — D500 Iron deficiency anemia secondary to blood loss (chronic): Secondary | ICD-10-CM

## 2019-04-12 DIAGNOSIS — I82431 Acute embolism and thrombosis of right popliteal vein: Secondary | ICD-10-CM | POA: Diagnosis not present

## 2019-04-12 MED ORDER — SODIUM CHLORIDE 0.9 % IV SOLN
Freq: Once | INTRAVENOUS | Status: AC
  Administered 2019-04-12: 14:00:00 via INTRAVENOUS
  Filled 2019-04-12: qty 250

## 2019-04-12 MED ORDER — SODIUM CHLORIDE 0.9 % IV SOLN
510.0000 mg | Freq: Once | INTRAVENOUS | Status: AC
  Administered 2019-04-12: 510 mg via INTRAVENOUS
  Filled 2019-04-12: qty 17

## 2019-04-14 ENCOUNTER — Telehealth: Payer: Self-pay

## 2019-04-14 NOTE — Telephone Encounter (Signed)
She is having pain.  Sometimes she is having pain and bleeding. SHe's almost to the point where any movement causes pain. The pain has been present since the surgery. Over hte past days the pain has been increasing.  The pain before was cramping more than anything. Now the pain is in her vagina and is at an 8-9/10.  Alleviating factors: soaked in epsom salts and this helped.  Aggravating factors: movement.  Associated symptoms: mild cramping. Light bleeding.   Denies fevers, chills.  She has ibuprofen and she hasn't taken this much because she doesn't like taking pills.  She would be due for a period at this time.  She is clear that the pain and discomfort is vaginal.   Recommend Replens over the weekend and let us know Monday, if no relief.  No concern for uterine infection at this time. However, a vaginal infection vs irritation is possible.  Prentice Docker, MD, [facility], [facility] 04/14/2019 5:01 PM  i

## 2019-04-14 NOTE — Telephone Encounter (Signed)
Pt calling; had ablation 9/17; is having substantial amount of pain; this is wk 2; What to do?  [phone]

## 2019-04-14 NOTE — Telephone Encounter (Signed)
SDJ can you advise in AMS absence please. Call pt or send her MyChart.

## 2019-04-20 ENCOUNTER — Telehealth: Payer: Self-pay

## 2019-04-20 NOTE — Telephone Encounter (Signed)
Please call pt and discuss

## 2019-04-20 NOTE — Telephone Encounter (Signed)
Pt is calling back having the same pain that she was happening when she spoke to [facility] in AMS absence. Pt is concerned for a vaginal infection she is having a lot of discomfort and pain. Pt is currently out of town and would like to speak with AMS because she is not able to come in and have a face to face with him. Pt had an ablation on 03/30/19. Please advise thank you

## 2019-04-21 ENCOUNTER — Other Ambulatory Visit: Payer: Self-pay | Admitting: Obstetrics and Gynecology

## 2019-04-21 MED ORDER — FLUCONAZOLE 150 MG PO TABS: 150.0000 mg | ORAL_TABLET | Freq: Once | ORAL | 0 refills | Status: AC

## 2019-04-21 NOTE — Telephone Encounter (Signed)
Called in diflucan have not been able to reach patient this morning or yesterday afternoon

## 2019-04-25 ENCOUNTER — Encounter: Payer: Self-pay | Admitting: Internal Medicine

## 2019-04-25 ENCOUNTER — Other Ambulatory Visit: Payer: Self-pay

## 2019-04-26 ENCOUNTER — Other Ambulatory Visit: Payer: Self-pay | Admitting: Obstetrics and Gynecology

## 2019-04-26 ENCOUNTER — Encounter: Payer: Self-pay | Admitting: Internal Medicine

## 2019-04-26 ENCOUNTER — Telehealth: Payer: Self-pay

## 2019-04-26 ENCOUNTER — Inpatient Hospital Stay (HOSPITAL_BASED_OUTPATIENT_CLINIC_OR_DEPARTMENT_OTHER): Payer: Medicare Other | Admitting: Internal Medicine

## 2019-04-26 ENCOUNTER — Inpatient Hospital Stay: Payer: Medicare Other

## 2019-04-26 ENCOUNTER — Other Ambulatory Visit: Payer: Self-pay

## 2019-04-26 ENCOUNTER — Inpatient Hospital Stay: Payer: Medicare Other | Attending: Internal Medicine

## 2019-04-26 DIAGNOSIS — Z7901 Long term (current) use of anticoagulants: Secondary | ICD-10-CM | POA: Diagnosis not present

## 2019-04-26 DIAGNOSIS — Z86711 Personal history of pulmonary embolism: Secondary | ICD-10-CM | POA: Insufficient documentation

## 2019-04-26 DIAGNOSIS — J45909 Unspecified asthma, uncomplicated: Secondary | ICD-10-CM | POA: Diagnosis not present

## 2019-04-26 DIAGNOSIS — G4730 Sleep apnea, unspecified: Secondary | ICD-10-CM | POA: Diagnosis not present

## 2019-04-26 DIAGNOSIS — N920 Excessive and frequent menstruation with regular cycle: Secondary | ICD-10-CM | POA: Diagnosis present

## 2019-04-26 DIAGNOSIS — D500 Iron deficiency anemia secondary to blood loss (chronic): Secondary | ICD-10-CM | POA: Diagnosis present

## 2019-04-26 DIAGNOSIS — I82431 Acute embolism and thrombosis of right popliteal vein: Secondary | ICD-10-CM

## 2019-04-26 DIAGNOSIS — I10 Essential (primary) hypertension: Secondary | ICD-10-CM | POA: Insufficient documentation

## 2019-04-26 DIAGNOSIS — Z86718 Personal history of other venous thrombosis and embolism: Secondary | ICD-10-CM | POA: Diagnosis not present

## 2019-04-26 DIAGNOSIS — Z79899 Other long term (current) drug therapy: Secondary | ICD-10-CM | POA: Diagnosis not present

## 2019-04-26 DIAGNOSIS — Z791 Long term (current) use of non-steroidal anti-inflammatories (NSAID): Secondary | ICD-10-CM | POA: Insufficient documentation

## 2019-04-26 LAB — COMPREHENSIVE METABOLIC PANEL
ALT: 14 U/L (ref 0–44)
AST: 14 U/L — ABNORMAL LOW (ref 15–41)
Albumin: 3.7 g/dL (ref 3.5–5.0)
Alkaline Phosphatase: 36 U/L — ABNORMAL LOW (ref 38–126)
Anion gap: 6 (ref 5–15)
BUN: 9 mg/dL (ref 6–20)
CO2: 27 mmol/L (ref 22–32)
Calcium: 8.8 mg/dL — ABNORMAL LOW (ref 8.9–10.3)
Chloride: 106 mmol/L (ref 98–111)
Creatinine, Ser: 0.65 mg/dL (ref 0.44–1.00)
GFR calc Af Amer: >60 mL/min (ref >60)
GFR calc non Af Amer: >60 mL/min (ref >60)
Glucose, Bld: 82 mg/dL (ref 70–99)
Potassium: 3.4 mmol/L — ABNORMAL LOW (ref 3.5–5.1)
Sodium: 139 mmol/L (ref 135–145)
Total Bilirubin: 0.5 mg/dL (ref 0.3–1.2)
Total Protein: 7 g/dL (ref 6.5–8.1)

## 2019-04-26 LAB — CBC WITH DIFFERENTIAL/PLATELET
Abs Immature Granulocytes: 0.03 10*3/uL (ref 0.00–0.07)
Basophils Absolute: 0.1 10*3/uL (ref 0.0–0.1)
Basophils Relative: 1 %
Eosinophils Absolute: 0.2 10*3/uL (ref 0.0–0.5)
Eosinophils Relative: 2 %
HCT: 36.3 % (ref 36.0–46.0)
Hemoglobin: 11.5 g/dL — ABNORMAL LOW (ref 12.0–15.0)
Immature Granulocytes: 0 %
Lymphocytes Relative: 28 %
Lymphs Abs: 2.9 10*3/uL (ref 0.7–4.0)
MCH: 26.1 pg (ref 26.0–34.0)
MCHC: 31.7 g/dL (ref 30.0–36.0)
MCV: 82.3 fL (ref 80.0–100.0)
Monocytes Absolute: 1.1 10*3/uL — ABNORMAL HIGH (ref 0.1–1.0)
Monocytes Relative: 11 %
Neutro Abs: 6 10*3/uL (ref 1.7–7.7)
Neutrophils Relative %: 58 %
Platelets: 238 10*3/uL (ref 150–400)
RBC: 4.41 MIL/uL (ref 3.87–5.11)
RDW: 26.8 % — ABNORMAL HIGH (ref 11.5–15.5)
WBC: 10.3 10*3/uL (ref 4.0–10.5)
nRBC: 0 % (ref 0.0–0.2)

## 2019-04-26 LAB — LACTATE DEHYDROGENASE: LDH: 134 U/L (ref 98–192)

## 2019-04-26 MED ORDER — MEDROXYPROGESTERONE ACETATE 10 MG PO TABS: 10.0000 mg | ORAL_TABLET | Freq: Every day | ORAL | 11 refills | Status: DC

## 2019-04-26 NOTE — Telephone Encounter (Signed)
Spoke w/patient. She is using 2 super size pads and having to change every 2.5 to 3 hours daily since the ablation.

## 2019-04-26 NOTE — Progress Notes (Signed)
[facility] CONSULT NOTE  Patient Care Team: [place], [facility] as PCP - Madaline Guthrie, MD as PCP - OBGYN (Obstetrics and Gynecology)  CHIEF COMPLAINTS/PURPOSE OF CONSULTATION:    HEMATOLOGY HISTORY  # August 2020 acute DVT right posterior tibial vein/calf; superficial thrombosis-Eliquis; SEP 2020-hypercoagulable work-up negative long term/indefinite anti-coagulation.   # FEB 2016- PULMONARY EMBOLISM/ hx R calf DVT [Dr.Gittin s/p surgery on lung anticoagulation for 6 months  # AUG 2020- Chronic iron deficient anemia-menorrhagia [Dr.Stabler]; Ferritin-5colo- ? 2 years ago.   # Chronic Lung disease-pneumonitis/fibrosis/ Dr.Fleming  HISTORY OF PRESENTING ILLNESS: Poor historian  Lisa [name] 48 y.o.  female with right calf DVT/anemia-iron deficiency is here for follow-up.  Patient received IV Feraheme infusion x3-noted to have slight improvement of her energy levels.  However she continues to be short of breath on exertion.  Patient continues been Eliquis for recent DVT.  Tolerating fairly well.  She continues to be on oral iron.  Review of Systems  Constitutional: Positive for malaise/fatigue. Negative for chills, diaphoresis, fever and weight loss.  HENT: Negative for nosebleeds and sore throat.   Eyes: Negative for double vision.  Respiratory: Positive for shortness of breath. Negative for cough, hemoptysis, sputum production and wheezing.   Cardiovascular: Negative for chest pain, palpitations, orthopnea and leg swelling.  Gastrointestinal: Negative for abdominal pain, blood in stool, constipation, diarrhea, heartburn, melena, nausea and vomiting.       Black stools (on iron pills)  Genitourinary: Negative for dysuria, frequency and urgency.  Musculoskeletal: Negative for back pain and joint pain.  Skin: Negative.  Negative for itching and rash.  Neurological: Positive for dizziness. Negative for tingling, focal weakness, weakness and  headaches.  Endo/Heme/Allergies: Does not bruise/bleed easily.  Psychiatric/Behavioral: Negative for depression. The patient is not nervous/anxious and does not have insomnia.     MEDICAL HISTORY:  Past Medical History:  Diagnosis Date  . Abdominal pain    d/t ongoing menstrual bleeding  . Anemia    iron deficiency, receiving Feraheme transfusions  . Asthma    nonspecific interstitial pneumonia.Marland Kitchendiagnosed 6 years ago  . Bipolar 1 disorder ([place])   . Blood in stool    happens often but unsure of etiology.  will have colonoscopy  . Change in voice   . Chest pain    has often but is due to lung disease  . Constipation   . Depression   . Diverticulitis   . DVT (deep venous thrombosis) ([place]) 12/13/2014  . Dysrhythmia   . Fatigue   . Gallstones 04/10/2011   with biliary pancreatitis  . GERD (gastroesophageal reflux disease)   . History of blood transfusion 02/2019   one unit d/t hgb being low  . Hypertension   . Migraines   . MRSA carrier    many years ago.  . Nausea   . Pancreatitis   . PTSD (post-traumatic stress disorder)   . Pulmonary embolism ([place]) 12/13/2014  . Sleep apnea    does not use cpap now.  will be retested soon  . Takes iron supplements    FERAHEME weekly transfusions as well as ferrous sulfate PO  . Trouble swallowing     SURGICAL HISTORY: Past Surgical History:  Procedure Laterality Date  . BRAVO [place] STUDY  06/02/2011   Procedure: BRAVO [place] STUDY;  Surgeon: Inda Castle, MD;  Location: [facility];  Service: Endoscopy;  Laterality: N/A;  . CHOLECYSTECTOMY    . DILATION AND CURETTAGE OF UTERUS    .  ENDOMETRIAL ABLATION N/A 03/30/2019   Procedure: ENDOMETRIAL ABLATION;  Surgeon: Malachy Mood, MD;  Location: [facility] ORS;  Service: Gynecology;  Laterality: N/A;  . ESOPHAGOGASTRODUODENOSCOPY  06/02/2011   Procedure: ESOPHAGOGASTRODUODENOSCOPY (EGD);  Surgeon: Inda Castle, MD;  Location: [facility];  Service: Endoscopy;  Laterality: N/A;  .  HYSTEROSCOPY  03/30/2019   Procedure: HYSTEROSCOPY;  Surgeon: Malachy Mood, MD;  Location: [facility] ORS;  Service: Gynecology;;  . LAPAROSCOPIC CHOLECYSTECTOMY W/ CHOLANGIOGRAPHY  04/25/2011   Dr Margot Chimes  . NASAL POLYP SURGERY     polyp from vocal cords  . TUBAL LIGATION  1996  . WISDOM TOOTH EXTRACTION      SOCIAL HISTORY: Social History   Socioeconomic History  . Marital status: Single    Spouse name: Not on file  . Number of children: 7  . Years of education: Not on file  . Highest education level: Not on file  Occupational History    Comment: disability  Social Needs  . Financial resource strain: Not on file  . Food insecurity    Worry: Not on file    Inability: Not on file  . Transportation needs    Medical: Not on file    Non-medical: Not on file  Tobacco Use  . Smoking status: Never Smoker  . Smokeless tobacco: Never Used  Substance and Sexual Activity  . Alcohol use: Yes    Comment: occ  . Drug use: No  . Sexual activity: Yes    Birth control/protection: Surgical  Lifestyle  . Physical activity    Days per week: Not on file    Minutes per session: Not on file  . Stress: Not on file  Relationships  . Social Herbalist on phone: Not on file    Gets together: Not on file    Attends religious service: Not on file    Active member of club or organization: Not on file    Attends meetings of clubs or organizations: Not on file    Relationship status: Not on file  . Intimate partner violence    Fear of current or ex partner: Not on file    Emotionally abused: Not on file    Physically abused: Not on file    Forced sexual activity: Not on file  Other Topics Concern  . Not on file  Social History Narrative   In [place]; with 2 sons; used to work in hospitality. Never smoked; ocassional alcohol.     FAMILY HISTORY: Family History  Problem Relation Age of Onset  . Diabetes Mother   . Pancreatic cancer Mother   . Lung cancer Father   . Cancer  Paternal Aunt breast  . Anesthesia problems Neg Hx   . Hypotension Neg Hx   . Malignant hyperthermia Neg Hx   . Pseudochol deficiency Neg Hx     ALLERGIES:  is allergic to lisinopril.  MEDICATIONS:  Current Outpatient Medications  Medication Sig Dispense Refill  . AIMOVIG 140 MG/ML SOAJ Inject 140 mg into the skin every 30 (thirty) days.    Marland Kitchen albuterol (PROVENTIL HFA;VENTOLIN HFA) 108 (90 Base) MCG/ACT inhaler Inhale 2 puffs into the lungs every 6 (six) hours as needed for wheezing or shortness of breath. 1 Inhaler 2  . azaTHIOprine (IMURAN) 50 MG tablet Take 50 mg by mouth daily.    . Eliquis DVT/PE Starter Pack (ELIQUIS STARTER PACK) 5 MG TABS Take as directed on package: start with two-5mg  tablets twice daily for 7 days. On day  8, switch to one-5mg  tablet twice daily. 1 each 2  . ferrous sulfate 325 (65 FE) MG tablet Take 325 mg by mouth daily with breakfast.    . Ipratropium-Albuterol (COMBIVENT) 20-100 MCG/ACT AERS respimat Inhale 1 puff into the lungs daily.     Marland Kitchen omeprazole (PRILOSEC) 40 MG capsule Take 40 mg by mouth daily.    . predniSONE (DELTASONE) 10 MG tablet Take 10 mg by mouth daily.     . Probiotic Product (ACIDOPHILUS/BIFIDUS PO) Take 1 capsule by mouth daily.    . propranolol (INDERAL) 20 MG tablet Take 20 mg by mouth 2 (two) times daily.    . SUMAtriptan (IMITREX) 100 MG tablet Take 100 mg by mouth every 2 (two) hours as needed for migraine.     Marland Kitchen HYDROcodone-acetaminophen (NORCO/VICODIN) 5-325 MG tablet Take 1 tablet by mouth every 6 (six) hours as needed. (Patient not taking: Reported on 04/25/2019) 12 tablet 0  . ibuprofen (ADVIL) 600 MG tablet Take 1 tablet (600 mg total) by mouth every 6 (six) hours as needed. (Patient not taking: Reported on 04/25/2019) 60 tablet 3   No current facility-administered medications for this visit.       PHYSICAL EXAMINATION:   Vitals:   04/26/19 1326  BP: 109/86  Resp: 18  Temp: (!) 97.3 F (36.3 C)   Filed Weights    04/26/19 1326  Weight: 277 lb (125.6 kg)    Physical Exam  Constitutional: She is oriented to person, place, and time and well-developed, well-nourished, and in no distress.  Obese.  HENT:  Head: Normocephalic and atraumatic.  Mouth/Throat: Oropharynx is clear and moist. No oropharyngeal exudate.  Eyes: Pupils are equal, round, and reactive to light.  Neck: Normal range of motion. Neck supple.  Cardiovascular: Normal rate and regular rhythm.  Pulmonary/Chest: Effort normal and breath sounds normal. No respiratory distress. She has no wheezes.  Abdominal: Soft. Bowel sounds are normal. She exhibits no distension and no mass. There is no abdominal tenderness. There is no rebound and no guarding.  Musculoskeletal: Normal range of motion.        General: No tenderness or edema.  Neurological: She is alert and oriented to person, place, and time.  Skin: Skin is warm.  Psychiatric: Affect normal.    LABORATORY DATA:  I have reviewed the data as listed Lab Results  Component Value Date   WBC 10.3 04/26/2019   HGB 11.5 (L) 04/26/2019   HCT 36.3 04/26/2019   MCV 82.3 04/26/2019   PLT 238 04/26/2019   Recent Labs    03/02/19 2304 03/03/19 2051 04/26/19 1304  NA 136 139 139  K 3.8 4.0 3.4*  CL 107 107 106  CO2 24 23 27   GLUCOSE 102* 107* 82  BUN 8 9 9   CREATININE 0.71 0.73 0.65  CALCIUM 8.9 9.3 8.8*  GFRNONAA >60 >60 >60  GFRAA >60 >60 >60  PROT 7.7  --  7.0  ALBUMIN 3.9  --  3.7  AST 15  --  14*  ALT 12  --  14  ALKPHOS 42  --  36*  BILITOT 0.4  --  0.5     No results found.  Acute deep vein thrombosis (DVT) of popliteal vein of right lower extremity (HCC) # Acute DVT- right posterior popliteal vein currently Eliquis.  Unprovoked/ prior history of question provoked DVT PE in 2016. Recommend long term/indefinite anti-coagulation.  Reviewed her hypercoagulable work-up negative-any putative causes.  #Given the recurrent DVT -I think is reasonable  to consider long-term  anticoagulation.  Discussed with the patient potential pros and cons long-term anticoagulation.  Discussed regarding avoiding falls/trauma on blood thinners.  Also counseled the patient regarding need for bridging/Lovenox perioperatively if surgery is needed.  Patient will call us prior to any surgical planning.  #Iron deficient anemia-chronic menorrhagia-s/p Ferrahem x3; improved-11.5.  Wants to hold off because of poor access.  #Chronic menorrhagia-on medroxyprogresterone as per gynecology.  As per patient next it would be hysterectomy.  # DISPOSITION: # HOLD Ferrahem today. # follow up in 2 month -MD-labs; cbc/possible Ferrahem-Dr.B    All questions were answered. The patient knows to call the clinic with any problems, questions or concerns.    Cammie Sickle, MD 04/26/2019 2:25 PM

## 2019-04-26 NOTE — Progress Notes (Signed)
Still bleeding post ablation.  Currently still within postoperative period were we are going to have some sloughing of the endometrial lining.  Will restart provera for now.  Hgb trending up based on hematology visit labs obtained today.

## 2019-04-26 NOTE — Telephone Encounter (Signed)
Patient states she is still bleeding s/p ablation 03/30/2019 w/[facility]. Her Hematologist is concerned about this d/t her anemia. [id number]

## 2019-04-26 NOTE — Assessment & Plan Note (Addendum)
#   Acute DVT- right posterior popliteal vein currently Eliquis.  Unprovoked/ prior history of question provoked DVT PE in 2016. Recommend long term/indefinite anti-coagulation.  Reviewed her hypercoagulable work-up negative-any putative causes.  #Given the recurrent DVT -I think is reasonable to consider long-term anticoagulation.  Discussed with the patient potential pros and cons long-term anticoagulation.  Discussed regarding avoiding falls/trauma on blood thinners.  Also counseled the patient regarding need for bridging/Lovenox perioperatively if surgery is needed.  Patient will call us prior to any surgical planning.  #Iron deficient anemia-chronic menorrhagia-s/p Ferrahem x3; improved-11.5.  Wants to hold off because of poor access.  #Chronic menorrhagia-on medroxyprogresterone as per gynecology.  As per patient next it would be hysterectomy.  # DISPOSITION: # HOLD Ferrahem today. # follow up in 2 month -MD-labs; cbc/possible Ferrahem-Dr.B

## 2019-05-12 ENCOUNTER — Ambulatory Visit (INDEPENDENT_AMBULATORY_CARE_PROVIDER_SITE_OTHER): Payer: Medicare Other | Admitting: Obstetrics and Gynecology

## 2019-05-12 ENCOUNTER — Encounter: Payer: Self-pay | Admitting: Obstetrics and Gynecology

## 2019-05-12 ENCOUNTER — Other Ambulatory Visit: Payer: Self-pay

## 2019-05-12 VITALS — BP 130/86 | HR 68 | Ht 68.0 in | Wt 280.0 lb

## 2019-05-12 DIAGNOSIS — N939 Abnormal uterine and vaginal bleeding, unspecified: Secondary | ICD-10-CM

## 2019-05-12 DIAGNOSIS — Z4889 Encounter for other specified surgical aftercare: Secondary | ICD-10-CM | POA: Diagnosis not present

## 2019-05-12 NOTE — Progress Notes (Signed)
Postoperative Follow-up Patient presents post op from hysteroscopy, D&C, endometrial ablation 6weeks ago for abnormal uterine bleeding.  Subjective: Patient reports no improvement in her preop symptoms. Eating a regular diet without difficulty. The patient is not having any pain.  Activity: normal activities of daily living.  The patient was even restarted on provera in the postoperative period, however she has been unable to achieve any decrease or cessation in bleeding.  Objective: Blood pressure 130/86, pulse 68, height 5\' 8"  (1.727 m), weight 280 lb (127 kg).  General: NAD Pulmonary: no increased work of breathing Abdomen: soft, non-tender, non-distended, incision(s) D/C/I GU: normal external female genitalia, cervix closed, normal in appearance, with moderate amount of bleeding noted from cervical os Extremities: no edema Neurologic: normal gait    Appointment on 04/26/2019  Component Date Value Ref Range Status  . Sodium 04/26/2019 139  135 - 145 mmol/L Final  . Potassium 04/26/2019 3.4* 3.5 - 5.1 mmol/L Final  . Chloride 04/26/2019 106  98 - 111 mmol/L Final  . CO2 04/26/2019 27  22 - 32 mmol/L Final  . Glucose, Bld 04/26/2019 82  70 - 99 mg/dL Final  . BUN 04/26/2019 9  6 - 20 mg/dL Final  . Creatinine, Ser 04/26/2019 0.65  0.44 - 1.00 mg/dL Final  . Calcium 04/26/2019 8.8* 8.9 - 10.3 mg/dL Final  . Total Protein 04/26/2019 7.0  6.5 - 8.1 g/dL Final  . Albumin 04/26/2019 3.7  3.5 - 5.0 g/dL Final  . AST 04/26/2019 14* 15 - 41 U/L Final  . ALT 04/26/2019 14  0 - 44 U/L Final  . Alkaline Phosphatase 04/26/2019 36* 38 - 126 U/L Final  . Total Bilirubin 04/26/2019 0.5  0.3 - 1.2 mg/dL Final  . GFR calc non Af Amer 04/26/2019 >60  >60 mL/min Final  . GFR calc Af Amer 04/26/2019 >60  >60 mL/min Final  . Anion gap 04/26/2019 6  5 - 15 Final   Performed at [facility], [address] [place],  [address]  . WBC 04/26/2019 10.3  4.0 - 10.5 K/uL Final   . RBC 04/26/2019 4.41  3.87 - 5.11 MIL/uL Final  . Hemoglobin 04/26/2019 11.5* 12.0 - 15.0 g/dL Final  . HCT 04/26/2019 36.3  36.0 - 46.0 % Final  . MCV 04/26/2019 82.3  80.0 - 100.0 fL Final  . MCH 04/26/2019 26.1  26.0 - 34.0 pg Final  . MCHC 04/26/2019 31.7  30.0 - 36.0 g/dL Final  . RDW 04/26/2019 26.8* 11.5 - 15.5 % Final  . Platelets 04/26/2019 238  150 - 400 K/uL Final  . nRBC 04/26/2019 0.0  0.0 - 0.2 % Final  . Neutrophils Relative % 04/26/2019 58  % Final  . Neutro Abs 04/26/2019 6.0  1.7 - 7.7 K/uL Final  . Lymphocytes Relative 04/26/2019 28  % Final  . Lymphs Abs 04/26/2019 2.9  0.7 - 4.0 K/uL Final  . Monocytes Relative 04/26/2019 11  % Final  . Monocytes Absolute 04/26/2019 1.1* 0.1 - 1.0 K/uL Final  . Eosinophils Relative 04/26/2019 2  % Final  . Eosinophils Absolute 04/26/2019 0.2  0.0 - 0.5 K/uL Final  . Basophils Relative 04/26/2019 1  % Final  . Basophils Absolute 04/26/2019 0.1  0.0 - 0.1 K/uL Final  . Immature Granulocytes 04/26/2019 0  % Final  . Abs Immature Granulocytes 04/26/2019 0.03  0.00 - 0.07 K/uL Final   Performed at [facility], [address] [place], [place]  [address]  . LDH 04/26/2019 134  98 - 192 U/L Final   Performed at [facility], [address] [place], [place] [address]    Assessment: 48 y.o. s/p hysteroscopy, D&C, and endometrial ablation stable  Plan: Patient has done well after surgery with no apparent complications.  I have discussed the post-operative course to date, and the expected progress moving forward.  The patient understands what complications to be concerned about.  I will see the patient in routine follow up, or sooner if needed.    Activity plan: No restriction.  No improvement in bleeding pattern.  Remains on anticoagulation.  Given this continued bleeding unresponsive to ablation AND hormonal attempts of control with progestin the only treatment option available at present is hysterectomy.   Malachy Mood, MD, [facility], [place] 05/12/2019, 9:34 AM

## 2019-05-17 ENCOUNTER — Telehealth: Payer: Self-pay | Admitting: Obstetrics and Gynecology

## 2019-05-17 NOTE — Telephone Encounter (Signed)
-----   Message from Malachy Mood, MD sent at 05/13/2019  7:39 AM EDT ----- Regarding: Surgery Surgery Booking Request Patient Full Name:  Lisa [name]  MRN: [id number]  DOB: [birth year]  Surgeon: Malachy Mood, MD  Requested Surgery Date and Time: 1-2 months Primary Diagnosis AND Code: Abnormal uterine bleeding N93.9 Secondary Diagnosis and Code:  Surgical Procedure: TLH/BS L&D Notification: No Admission Status: observation Length of Surgery: 2hrs Special Case Needs: No H&P: Yes Phone Interview???:  No Interpreter: No Language:  Medical Clearance:  Yes Special Scheduling Instructions: Input from hematology as to timing as she will need to come off anticoagulation prior to procedure may restart 24-48hrs postoperative Any known health/anesthesia issues, diabetes, sleep apnea, latex allergy, defibrillator/pacemaker?: No Acuity: P2   (P1 highest, P2 delay may cause harm, P3 low, elective gyn, P4 lowest)

## 2019-05-17 NOTE — Telephone Encounter (Signed)
Patient is aware of H&P at [facility] on 06/19/19 @ 10:10am w/ Dr. Georgianne Fick, Pre-admit testing to be scheduled, COVID testing on 06/26/19, and OR on 06/29/19. Patient is aware to quarantine after COVID testing. Patient is aware she may receive calls from the [facility]. Patient confirmed Medicare and Medicaid of [place]. Patient has a pulmonology appointment on 06/28/19, and would like to know if she should reschedule (quarantine after 06/26/19 COVID testing)? Patient would like to know if she should continue the medication for bleeding? She said the bleeding has not stopped.

## 2019-05-17 NOTE — Telephone Encounter (Signed)
Referral to hematology

## 2019-05-18 NOTE — Telephone Encounter (Signed)
Yes continue provera for now, I think she can go to the pulmonology appointment

## 2019-05-19 ENCOUNTER — Telehealth: Payer: Self-pay | Admitting: Obstetrics and Gynecology

## 2019-05-19 NOTE — Telephone Encounter (Signed)
Called and spoke with patient she is aware of her appointment for her preadmission and her covid testing on 06/26/2019 @ 11:30.

## 2019-05-30 ENCOUNTER — Other Ambulatory Visit: Payer: Self-pay

## 2019-05-30 DIAGNOSIS — U071 COVID-19: Secondary | ICD-10-CM | POA: Diagnosis not present

## 2019-05-30 DIAGNOSIS — R519 Headache, unspecified: Secondary | ICD-10-CM | POA: Diagnosis present

## 2019-05-30 DIAGNOSIS — Z79899 Other long term (current) drug therapy: Secondary | ICD-10-CM | POA: Insufficient documentation

## 2019-05-30 DIAGNOSIS — J45909 Unspecified asthma, uncomplicated: Secondary | ICD-10-CM | POA: Insufficient documentation

## 2019-05-30 DIAGNOSIS — I10 Essential (primary) hypertension: Secondary | ICD-10-CM | POA: Insufficient documentation

## 2019-05-30 DIAGNOSIS — G43909 Migraine, unspecified, not intractable, without status migrainosus: Secondary | ICD-10-CM | POA: Insufficient documentation

## 2019-05-30 DIAGNOSIS — Z7901 Long term (current) use of anticoagulants: Secondary | ICD-10-CM | POA: Diagnosis not present

## 2019-05-30 DIAGNOSIS — M791 Myalgia, unspecified site: Secondary | ICD-10-CM | POA: Diagnosis not present

## 2019-05-30 NOTE — ED Triage Notes (Signed)
Pt presents via POV c/o body aches x1 week. Denies fevers. Reports headache.

## 2019-05-31 ENCOUNTER — Emergency Department
Admission: EM | Admit: 2019-05-31 | Discharge: 2019-05-31 | Disposition: A | Discharge status: Home / Self Care | Payer: Medicare Other | Attending: Emergency Medicine | Admitting: Emergency Medicine

## 2019-05-31 DIAGNOSIS — U071 COVID-19: Secondary | ICD-10-CM | POA: Diagnosis not present

## 2019-05-31 DIAGNOSIS — M791 Myalgia, unspecified site: Secondary | ICD-10-CM

## 2019-05-31 DIAGNOSIS — G43909 Migraine, unspecified, not intractable, without status migrainosus: Secondary | ICD-10-CM

## 2019-05-31 LAB — BASIC METABOLIC PANEL
Anion gap: 7 (ref 5–15)
BUN: 9 mg/dL (ref 6–20)
CO2: 23 mmol/L (ref 22–32)
Calcium: 8.6 mg/dL — ABNORMAL LOW (ref 8.9–10.3)
Chloride: 110 mmol/L (ref 98–111)
Creatinine, Ser: 0.57 mg/dL (ref 0.44–1.00)
GFR calc Af Amer: >60 mL/min (ref >60)
GFR calc non Af Amer: >60 mL/min (ref >60)
Glucose, Bld: 93 mg/dL (ref 70–99)
Potassium: 3.4 mmol/L — ABNORMAL LOW (ref 3.5–5.1)
Sodium: 140 mmol/L (ref 135–145)

## 2019-05-31 LAB — CBC WITH DIFFERENTIAL/PLATELET
Abs Immature Granulocytes: 0.02 10*3/uL (ref 0.00–0.07)
Basophils Absolute: 0 10*3/uL (ref 0.0–0.1)
Basophils Relative: 0 %
Eosinophils Absolute: 0.1 10*3/uL (ref 0.0–0.5)
Eosinophils Relative: 1 %
HCT: 40.1 % (ref 36.0–46.0)
Hemoglobin: 13.4 g/dL (ref 12.0–15.0)
Immature Granulocytes: 0 %
Lymphocytes Relative: 27 %
Lymphs Abs: 1.8 10*3/uL (ref 0.7–4.0)
MCH: 27.7 pg (ref 26.0–34.0)
MCHC: 33.4 g/dL (ref 30.0–36.0)
MCV: 82.9 fL (ref 80.0–100.0)
Monocytes Absolute: 0.9 10*3/uL (ref 0.1–1.0)
Monocytes Relative: 15 %
Neutro Abs: 3.7 10*3/uL (ref 1.7–7.7)
Neutrophils Relative %: 57 %
Platelets: 227 10*3/uL (ref 150–400)
RBC: 4.84 MIL/uL (ref 3.87–5.11)
RDW: 17.6 % — ABNORMAL HIGH (ref 11.5–15.5)
WBC: 6.5 10*3/uL (ref 4.0–10.5)
nRBC: 0 % (ref 0.0–0.2)

## 2019-05-31 LAB — MAGNESIUM: Magnesium: 2.2 mg/dL (ref 1.7–2.4)

## 2019-05-31 LAB — SARS CORONAVIRUS 2 (TAT 6-24 HRS): SARS Coronavirus 2: POSITIVE — AB

## 2019-05-31 MED ORDER — DROPERIDOL 2.5 MG/ML IJ SOLN
2.5000 mg | Freq: Once | INTRAMUSCULAR | Status: AC
  Administered 2019-05-31: 2.5 mg via INTRAVENOUS
  Filled 2019-05-31: qty 2

## 2019-05-31 MED ORDER — DEXAMETHASONE SODIUM PHOSPHATE 10 MG/ML IJ SOLN
10.0000 mg | Freq: Once | INTRAMUSCULAR | Status: AC
  Administered 2019-05-31: 10 mg via INTRAVENOUS
  Filled 2019-05-31: qty 1

## 2019-05-31 MED ORDER — KETOROLAC TROMETHAMINE 30 MG/ML IJ SOLN
15.0000 mg | Freq: Once | INTRAMUSCULAR | Status: AC
  Administered 2019-05-31: 15 mg via INTRAVENOUS
  Filled 2019-05-31: qty 1

## 2019-05-31 MED ORDER — SODIUM CHLORIDE 0.9 % IV BOLUS
500.0000 mL | Freq: Once | INTRAVENOUS | Status: AC
  Administered 2019-05-31: 500 mL via INTRAVENOUS

## 2019-05-31 NOTE — Discharge Instructions (Addendum)
You have been seen in the Emergency Department (ED) for a migraine.  Please use Tylenol or Motrin as needed for symptoms, but only as written on the box, and take any regular medications that have been prescribed for you.  As we have discussed, please follow up with your doctor as soon as possible regarding todays ED visit and your headache symptoms.    Remember to check MyChart over the next couple of days to see the result of your COVID-19 test.  Even though you do not have any other symptoms recheck tonight given your body aches and it is important that you isolate yourself as much as possible at least until you get a negative result.  Please refer to the included information.  Call your doctor or return to the Emergency Department (ED) if you have a worsening headache, sudden and severe headache, confusion, slurred speech, facial droop, weakness or numbness in any arm or leg, extreme fatigue, or other symptoms that concern you.

## 2019-05-31 NOTE — ED Notes (Signed)
Patient called and notified of + COVID result.  Understanding verbalized.

## 2019-05-31 NOTE — ED Provider Notes (Signed)
[facility] Emergency Department Provider Note  ____________________________________________   First MD Initiated Contact with Patient 05/31/19 [phone]     (approximate)  I have reviewed the triage vital signs and the nursing notes.   HISTORY  Chief Complaint Generalized Body Aches    HPI Lisa [name] is a 48 y.o. female with medical history as listed below which notably includes migraines as well as menometrorrhagia and recurrent DVT on Eliquis.  She presents tonight for about a week of body aches as well as a persistent generalized headache that feels similar to her recurrent migraines but the medicines that she usually takes to help with migraines is not working.  She says it is a generalized severe throbbing and aching pain which is made worse with bright lights and loud noises.  She has had some nausea but no vomiting.  She has a little bit of chronic shortness of breath and cough which is no worse than usual.  No abdominal pain or dysuria.  No visual disturbances other than the photophobia.  No known COVID-19 contacts.  She reports the symptoms in terms of the headache are severe and the generalized body aches are moderate.  Nothing in particular makes them better.         Past Medical History:  Diagnosis Date  . Abdominal pain    d/t ongoing menstrual bleeding  . Anemia    iron deficiency, receiving Feraheme transfusions  . Asthma    nonspecific interstitial pneumonia.Marland Kitchendiagnosed 6 years ago  . Bipolar 1 disorder ([place])   . Blood in stool    happens often but unsure of etiology.  will have colonoscopy  . Change in voice   . Chest pain    has often but is due to lung disease  . Constipation   . Depression   . Diverticulitis   . DVT (deep venous thrombosis) ([place]) 12/13/2014  . Dysrhythmia   . Fatigue   . Gallstones 04/10/2011   with biliary pancreatitis  . GERD (gastroesophageal reflux disease)   . History of blood transfusion 02/2019   one  unit d/t hgb being low  . Hypertension   . Migraines   . MRSA carrier    many years ago.  . Nausea   . Pancreatitis   . PTSD (post-traumatic stress disorder)   . Pulmonary embolism ([place]) 12/13/2014  . Sleep apnea    does not use cpap now.  will be retested soon  . Takes iron supplements    FERAHEME weekly transfusions as well as ferrous sulfate PO  . Trouble swallowing     Patient Active Problem List   Diagnosis Date Noted  . Chronic constipation 03/21/2019  . Menorrhagia 03/21/2019  . Migraine 03/21/2019  . Vocal cord polyps 03/21/2019  . Acute deep vein thrombosis (DVT) of popliteal vein of right lower extremity ([place]) 03/15/2019  . Iron deficiency anemia due to chronic blood loss 03/15/2019  . Symptomatic anemia 03/03/2019  . Asthma without status asthmaticus 08/10/2017  . Depression 08/10/2017  . Obesity, Class III, BMI 40-49.9 (morbid obesity) ([place]) 08/10/2017  . Prediabetes 04/19/2017  . Essential hypertension 04/16/2017  . DVT (deep venous thrombosis) ([place]) 12/13/2014  . Pulmonary embolism ([place]) 12/13/2014  . Nonspecific interstitial pneumonia ([place]) 08/22/2014  . Cough, persistent 04/30/2014  . Obstructive apnea 04/30/2014  . Endometrial polyp 09/30/2013  . High BMI 08/25/2013  . Dysmenorrhea 08/25/2013  . Abdominal bloating 08/25/2013  . Schizoaffective disorder ([place]) 05/12/2013  . PTSD (post-traumatic stress disorder)  05/12/2013  . Bipolar I disorder, most recent episode (or current) depressed, unspecified 05/12/2013  . Abdominal pain, generalized 05/28/2011  . Esophageal reflux 05/28/2011    Past Surgical History:  Procedure Laterality Date  . BRAVO [place] STUDY  06/02/2011   Procedure: BRAVO [place] STUDY;  Surgeon: Inda Castle, MD;  Location: [facility] ENDOSCOPY;  Service: Endoscopy;  Laterality: N/A;  . CHOLECYSTECTOMY    . DILATION AND CURETTAGE OF UTERUS    . ENDOMETRIAL ABLATION N/A 03/30/2019   Procedure: ENDOMETRIAL ABLATION;  Surgeon: Malachy Mood, MD;   Location: [facility] ORS;  Service: Gynecology;  Laterality: N/A;  . ESOPHAGOGASTRODUODENOSCOPY  06/02/2011   Procedure: ESOPHAGOGASTRODUODENOSCOPY (EGD);  Surgeon: Inda Castle, MD;  Location: [facility];  Service: Endoscopy;  Laterality: N/A;  . HYSTEROSCOPY  03/30/2019   Procedure: HYSTEROSCOPY;  Surgeon: Malachy Mood, MD;  Location: [facility] ORS;  Service: Gynecology;;  . LAPAROSCOPIC CHOLECYSTECTOMY W/ CHOLANGIOGRAPHY  04/25/2011   Dr Margot Chimes  . NASAL POLYP SURGERY     polyp from vocal cords  . TUBAL LIGATION  1996  . WISDOM TOOTH EXTRACTION      Prior to Admission medications   Medication Sig Start Date End Date Taking? Authorizing Provider  AIMOVIG 140 MG/ML SOAJ Inject 140 mg into the skin every 30 (thirty) days. 02/19/19   [provider]  albuterol (PROVENTIL HFA;VENTOLIN HFA) 108 (90 Base) MCG/ACT inhaler Inhale 2 puffs into the lungs every 6 (six) hours as needed for wheezing or shortness of breath. 02/28/17   Rudene Re, MD  azaTHIOprine (IMURAN) 50 MG tablet Take 50 mg by mouth daily. 02/02/19   [provider]  Eliquis DVT/PE Starter Pack (ELIQUIS STARTER PACK) 5 MG TABS Take as directed on package: start with two-5mg  tablets twice daily for 7 days. On day 8, switch to one-5mg  tablet twice daily. 03/15/19   Cammie Sickle, MD  ferrous sulfate 325 (65 FE) MG tablet Take 325 mg by mouth daily with breakfast.    [provider]  HYDROcodone-acetaminophen (NORCO/VICODIN) 5-325 MG tablet Take 1 tablet by mouth every 6 (six) hours as needed. 03/30/19   Malachy Mood, MD  ibuprofen (ADVIL) 600 MG tablet Take 1 tablet (600 mg total) by mouth every 6 (six) hours as needed. 03/30/19   Malachy Mood, MD  Ipratropium-Albuterol (COMBIVENT) 20-100 MCG/ACT AERS respimat Inhale 1 puff into the lungs daily.     [provider]  medroxyPROGESTERone (PROVERA) 10 MG tablet Take 1 tablet (10 mg total) by mouth daily. 04/26/19   Malachy Mood, MD   omeprazole (PRILOSEC) 40 MG capsule Take 40 mg by mouth daily. 01/03/19   [provider]  predniSONE (DELTASONE) 10 MG tablet Take 10 mg by mouth daily.     [provider]  Probiotic Product (ACIDOPHILUS/BIFIDUS PO) Take 1 capsule by mouth daily.    [provider]  propranolol (INDERAL) 20 MG tablet Take 20 mg by mouth 2 (two) times daily. 01/03/19 01/03/20  [provider]  SUMAtriptan (IMITREX) 100 MG tablet Take 100 mg by mouth every 2 (two) hours as needed for migraine.  02/10/19   [provider]    Allergies Lisinopril  Family History  Problem Relation Age of Onset  . Diabetes Mother   . Pancreatic cancer Mother   . Lung cancer Father   . Cancer Paternal Aunt breast  . Anesthesia problems Neg Hx   . Hypotension Neg Hx   . Malignant hyperthermia Neg Hx   . Pseudochol deficiency Neg Hx  Social History Social History   Tobacco Use  . Smoking status: Never Smoker  . Smokeless tobacco: Never Used  Substance Use Topics  . Alcohol use: Yes    Comment: occ  . Drug use: No    Review of Systems Constitutional: No fever/chills Eyes: No visual changes. ENT: No sore throat. Cardiovascular: Denies chest pain. Respiratory: Denies shortness of breath. Gastrointestinal: No abdominal pain.  No nausea, no vomiting.  No diarrhea.  No constipation. Genitourinary: Negative for dysuria. Musculoskeletal: Generalized body aches.  Negative for neck pain.  Negative for back pain. Integumentary: Negative for rash. Neurological: Generalized headache similar to prior migraines as described above for about a week.  No focal numbness nor weakness.   ____________________________________________   PHYSICAL EXAM:  VITAL SIGNS: ED Triage Vitals  Enc Vitals Group     BP 05/30/19 2212 (!) 153/92     Pulse Rate 05/30/19 2212 75     Resp 05/30/19 2212 18     Temp 05/30/19 2212 99.6 F (37.6 C)     Temp Source 05/30/19 2212 Oral     SpO2  05/30/19 2212 97 %     Weight 05/30/19 2211 123.4 kg (272 lb)     Height 05/30/19 2211 1.727 m (5\' 8" )     Head Circumference --      Peak Flow --      Pain Score 05/30/19 2215 7     Pain Loc --      Pain Edu? --      Excl. in [place]? --     Constitutional: Alert and oriented.  Appears uncomfortable but is not in acute distress. Eyes: Conjunctivae are normal.  Pupils are equal and reactive bilaterally.  No nystagmus. Head: Atraumatic. Nose: No congestion/rhinnorhea. Mouth/Throat: Patient is wearing a mask. Neck: No stridor.  No meningeal signs.   Cardiovascular: Normal rate, regular rhythm. Good peripheral circulation. Grossly normal heart sounds. Respiratory: Normal respiratory effort.  No retractions. Gastrointestinal: Soft and nontender. No distention.  Musculoskeletal: No lower extremity tenderness nor edema. No gross deformities of extremities. Neurologic:  Normal speech and language. No gross focal neurologic deficits are appreciated.  Skin:  Skin is warm, dry and intact. Psychiatric: Mood and affect are normal. Speech and behavior are normal.  ____________________________________________   LABS (all labs ordered are listed, but only abnormal results are displayed)  Labs Reviewed  CBC WITH DIFFERENTIAL/PLATELET - Abnormal; Notable for the following components:      Result Value   RDW 17.6 (*)    All other components within normal limits  BASIC METABOLIC PANEL - Abnormal; Notable for the following components:   Potassium 3.4 (*)    Calcium 8.6 (*)    All other components within normal limits  SARS CORONAVIRUS 2 (TAT 6-24 HRS)  MAGNESIUM   ____________________________________________  EKG  No indication for EKG.  Review of prior EKGs demonstrates no QT prolongation. ____________________________________________  RADIOLOGY Ursula Alert, personally viewed and evaluated these images (plain radiographs) as part of my medical decision making, as well as reviewing the  written report by the radiologist.  ED MD interpretation: No indication for emergent imaging  Official radiology report(s): No results found.  ____________________________________________   PROCEDURES   Procedure(s) performed (including Critical Care):  Procedures   ____________________________________________   INITIAL IMPRESSION / MDM / [place] / ED COURSE  As part of my medical decision making, I reviewed the following data within the [place] notes reviewed and incorporated,  Labs reviewed , Old EKG reviewed and Notes from prior ED visits   Differential diagnosis includes, but is not limited to, migraine, acute intracranial hemorrhage or mass, viral illness including COVID-19, acute on chronic anemia.  The patient reports that she has more or less constant vaginal bleeding but it stopped 3 days ago.  She is little bit worried about her hemoglobin but mostly she needs help with her headache.  I think an acute intracranial hemorrhage or other emergent finding is very unlikely given the persistence of the symptoms for about a week and it is more likely she is suffering from a viral illness that has caused her to have a persistent migraine.  Vital signs are within normal limits and she has been repeatedly afebrile.  She has no other signs and symptoms of COVID-19 except for the ones described above.  She denies sore throat and loss of smell or taste and she has been trying to isolate herself.  She has no history of QT prolongation on prior EKGs and there is no indication for any EKG tonight.  I am ordering a migraine cocktail of droperidol 2.5 mg IV, Toradol 15 mg IV, Decadron 10 mg IV, and 500 mL of normal saline IV bolus.  We will check basic labs to make sure her electrolytes are within normal limits as well as to make sure that her hemoglobin has not dropped substantially.  We will check a COVID-19 swab and the patient understands that she needs  to check MyChart for the results within a couple of days.  She is comfortable with the plan for discharge assuming that she feels some symptomatic improvement after the migraine treatment.      Clinical Course as of May 30 310  Wed May 31, 2019  0309 Patient feels well, significant improved from prior.  Lab work is all within normal limits except for very slightly decreased potassium which is of unclear clinical significance.  I will discharge her for outpatient follow-up as previously described.   [CF]    Clinical Course User Index [CF] Hinda Kehr, MD     ____________________________________________  FINAL CLINICAL IMPRESSION(S) / ED DIAGNOSES  Final diagnoses:  Migraine without status migrainosus, not intractable, unspecified migraine type  Myalgia     MEDICATIONS GIVEN DURING THIS VISIT:  Medications  droperidol (INAPSINE) 2.5 MG/ML injection 2.5 mg (2.5 mg Intravenous Given 05/31/19 0133)  ketorolac (TORADOL) 30 MG/ML injection 15 mg (15 mg Intravenous Given 05/31/19 0133)  sodium chloride 0.9 % bolus 500 mL (0 mLs Intravenous Stopped 05/31/19 0238)  dexamethasone (DECADRON) injection 10 mg (10 mg Intravenous Given 05/31/19 0134)     ED Discharge Orders    None      *Please note:  Anshu Staal was evaluated in Emergency Department on 05/31/2019 for the symptoms described in the history of present illness. She was evaluated in the context of the global COVID-19 pandemic, which necessitated consideration that the patient might be at risk for infection with the SARS-CoV-2 virus that causes COVID-19. Institutional protocols and algorithms that pertain to the evaluation of patients at risk for COVID-19 are in a state of rapid change based on information released by regulatory bodies including the CDC and federal and state organizations. These policies and algorithms were followed during the patient's care in the ED.  Some ED evaluations and interventions may be delayed  as a result of limited staffing during the pandemic.*  Note:  This document was prepared using Systems analyst  and may include unintentional dictation errors.   Hinda Kehr, MD 05/31/19 [phone]

## 2019-05-31 NOTE — ED Notes (Signed)
Report off to kate rn  

## 2019-05-31 NOTE — ED Notes (Signed)
Pt reports headache is improved.  Pt alert  Speech clear.

## 2019-05-31 NOTE — ED Notes (Signed)
Pt reports bodyaches, headache and chills for 1 week.  No n/v/d.  No relief with meds at home.  Pt alert  Speech clear.

## 2019-06-06 NOTE — Telephone Encounter (Signed)
Per my conversation w/ Kenney Houseman, this morning, she let the patient know she could continue the provera and go to the pulmonology appointment.

## 2019-06-19 ENCOUNTER — Ambulatory Visit (INDEPENDENT_AMBULATORY_CARE_PROVIDER_SITE_OTHER): Payer: Medicare Other | Admitting: Obstetrics and Gynecology

## 2019-06-19 ENCOUNTER — Encounter: Payer: Self-pay | Admitting: Obstetrics and Gynecology

## 2019-06-19 ENCOUNTER — Other Ambulatory Visit: Payer: Self-pay

## 2019-06-19 VITALS — BP 136/84 | HR 62 | Ht 68.0 in | Wt 277.0 lb

## 2019-06-19 DIAGNOSIS — N939 Abnormal uterine and vaginal bleeding, unspecified: Secondary | ICD-10-CM | POA: Diagnosis not present

## 2019-06-19 DIAGNOSIS — Z01818 Encounter for other preprocedural examination: Secondary | ICD-10-CM

## 2019-06-19 NOTE — Progress Notes (Signed)
Obstetrics & Gynecology Surgery H&P    Chief Complaint: Scheduled Surgery   History of Present Illness: Patient is a 48 y.o. No obstetric history on file. presenting for scheduled TLH, BS, and cystoscopy for the treatment or further evaluation of AUB.   Prior Treatments prior to proceeding with surgery include: hormonal means of control as well as endometrial ablation  Preoperative Pap: 03/14/2019 Results: NIL HPV negative  Preoperative Endometrial biopsy: 03/14/2019 Findings: pathology secretory endometrium no hyperplasia or malignancy Preoperative Ultrasound:  03/14/2019 Findings: normal uterus and ovaries   Review of Systems:10 point review of systems  Past Medical History:  Past Medical History:  Diagnosis Date  . Abdominal pain    d/t ongoing menstrual bleeding  . Anemia    iron deficiency, receiving Feraheme transfusions  . Asthma    nonspecific interstitial pneumonia.Marland Kitchendiagnosed 6 years ago  . Bipolar 1 disorder ([place])   . Blood in stool    happens often but unsure of etiology.  will have colonoscopy  . Change in voice   . Chest pain    has often but is due to lung disease  . Constipation   . Depression   . Diverticulitis   . DVT (deep venous thrombosis) ([place]) 12/13/2014  . Dysrhythmia   . Fatigue   . Gallstones 04/10/2011   with biliary pancreatitis  . GERD (gastroesophageal reflux disease)   . History of blood transfusion 02/2019   one unit d/t hgb being low  . Hypertension   . Migraines   . MRSA carrier    many years ago.  . Nausea   . Pancreatitis   . PTSD (post-traumatic stress disorder)   . Pulmonary embolism ([place]) 12/13/2014  . Sleep apnea    does not use cpap now.  will be retested soon  . Takes iron supplements    FERAHEME weekly transfusions as well as ferrous sulfate PO  . Trouble swallowing     Past Surgical History:  Past Surgical History:  Procedure Laterality Date  . BRAVO [place] STUDY  06/02/2011   Procedure: BRAVO [place] STUDY;  Surgeon: Inda Castle, MD;  Location: [facility];  Service: Endoscopy;  Laterality: N/A;  . CHOLECYSTECTOMY    . DILATION AND CURETTAGE OF UTERUS    . ENDOMETRIAL ABLATION N/A 03/30/2019   Procedure: ENDOMETRIAL ABLATION;  Surgeon: Malachy Mood, MD;  Location: [facility] ORS;  Service: Gynecology;  Laterality: N/A;  . ESOPHAGOGASTRODUODENOSCOPY  06/02/2011   Procedure: ESOPHAGOGASTRODUODENOSCOPY (EGD);  Surgeon: Inda Castle, MD;  Location: [facility];  Service: Endoscopy;  Laterality: N/A;  . HYSTEROSCOPY  03/30/2019   Procedure: HYSTEROSCOPY;  Surgeon: Malachy Mood, MD;  Location: [facility] ORS;  Service: Gynecology;;  . LAPAROSCOPIC CHOLECYSTECTOMY W/ CHOLANGIOGRAPHY  04/25/2011   Dr Margot Chimes  . NASAL POLYP SURGERY     polyp from vocal cords  . TUBAL LIGATION  1996  . WISDOM TOOTH EXTRACTION      Family History:  Family History  Problem Relation Age of Onset  . Diabetes Mother   . Pancreatic cancer Mother   . Lung cancer Father   . Cancer Paternal Aunt breast  . Anesthesia problems Neg Hx   . Hypotension Neg Hx   . Malignant hyperthermia Neg Hx   . Pseudochol deficiency Neg Hx     Social History:  Social History   Socioeconomic History  . Marital status: Single    Spouse name: Not on file  . Number of children: 7  . Years of education: Not  on file  . Highest education level: Not on file  Occupational History    Comment: disability  Social Needs  . Financial resource strain: Not on file  . Food insecurity    Worry: Not on file    Inability: Not on file  . Transportation needs    Medical: Not on file    Non-medical: Not on file  Tobacco Use  . Smoking status: Never Smoker  . Smokeless tobacco: Never Used  Substance and Sexual Activity  . Alcohol use: Yes    Comment: occ  . Drug use: No  . Sexual activity: Yes    Birth control/protection: Surgical  Lifestyle  . Physical activity    Days per week: Not on file    Minutes per session: Not on file  . Stress: Not on file   Relationships  . Social Herbalist on phone: Not on file    Gets together: Not on file    Attends religious service: Not on file    Active member of club or organization: Not on file    Attends meetings of clubs or organizations: Not on file    Relationship status: Not on file  . Intimate partner violence    Fear of current or ex partner: Not on file    Emotionally abused: Not on file    Physically abused: Not on file    Forced sexual activity: Not on file  Other Topics Concern  . Not on file  Social History Narrative   In [place]; with 2 sons; used to work in hospitality. Never smoked; ocassional alcohol.     Allergies:  Allergies  Allergen Reactions  . Lisinopril Anaphylaxis    Facial swelling, difficulty breathing    Medications: Prior to Admission medications   Medication Sig Start Date End Date Taking? Authorizing Provider  AIMOVIG 140 MG/ML SOAJ Inject 140 mg into the skin every 30 (thirty) days. 02/19/19  Yes [provider]  albuterol (PROVENTIL HFA;VENTOLIN HFA) 108 (90 Base) MCG/ACT inhaler Inhale 2 puffs into the lungs every 6 (six) hours as needed for wheezing or shortness of breath. 02/28/17  Yes Alfred Levins, [place], MD  azaTHIOprine (IMURAN) 50 MG tablet Take 50 mg by mouth daily. 02/02/19  Yes [provider]  Eliquis DVT/PE Starter Pack (ELIQUIS STARTER PACK) 5 MG TABS Take as directed on package: start with two-5mg  tablets twice daily for 7 days. On day 8, switch to one-5mg  tablet twice daily. 03/15/19  Yes Cammie Sickle, MD  ferrous sulfate 325 (65 FE) MG tablet Take 325 mg by mouth daily with breakfast.   Yes [provider]  HYDROcodone-acetaminophen (NORCO/VICODIN) 5-325 MG tablet Take 1 tablet by mouth every 6 (six) hours as needed. 03/30/19  Yes Malachy Mood, MD  ibuprofen (ADVIL) 600 MG tablet Take 1 tablet (600 mg total) by mouth every 6 (six) hours as needed. 03/30/19  Yes Malachy Mood, MD   Ipratropium-Albuterol (COMBIVENT) 20-100 MCG/ACT AERS respimat Inhale 1 puff into the lungs daily.    Yes [provider]  medroxyPROGESTERone (PROVERA) 10 MG tablet Take 1 tablet (10 mg total) by mouth daily. 04/26/19  Yes Malachy Mood, MD  omeprazole (PRILOSEC) 40 MG capsule Take 40 mg by mouth daily. 01/03/19  Yes [provider]  predniSONE (DELTASONE) 10 MG tablet Take 10 mg by mouth daily.    Yes [provider]  Probiotic Product (ACIDOPHILUS/BIFIDUS PO) Take 1 capsule by mouth daily.   Yes [provider]  propranolol (INDERAL) 20 MG tablet Take 20 mg by mouth 2 (two) times daily. 01/03/19 01/03/20 Yes [provider]  SUMAtriptan (IMITREX) 100 MG tablet Take 100 mg by mouth every 2 (two) hours as needed for migraine.  02/10/19  Yes [provider]    Physical Exam Vitals: Blood pressure 136/84, pulse 62, height 5\' 8"  (1.727 m), weight 277 lb (125.6 kg), last menstrual period 05/24/2019. General: NAD, well nourished, appears stated age 8: normocephalic, anicteric Pulmonary: CTAB, No increased work of breathing Cardiovascular: RRR, distal pulses 2+ Abdomen: soft, non-tender, non-distended, prior trocar site umbilicus, well as LLQ Extremities: no edema, erythema, or tenderness Neurologic: Grossly intact Psychiatric: mood appropriate, affect full  Imaging No results found.  Assessment: 48 y.o. presenting for scheduled TLH, BS, cystoscopy  Plan: 1) Patient opts for definitive surgical management via hysterectomy. The risks of surgery were discussed in detail with the patient including but not limited to: bleeding which may require transfusion or reoperation; infection which may require antibiotics; injury to bowel, bladder, ureters or other surrounding organs (With a literature reported rate of urinary tract injury of 1% quoted); need for additional procedures including laparotomy; thromboembolic phenomenon, incisional problems  and other postoperative/anesthesia complications.  Patient was also advised that recovery procedure generally involves an overnight stay; and the  expected recovery time after a hysterectomy being in the range of 6-8 weeks.  Likelihood of success in alleviating the patient's symptoms was discussed.  While definitive in regards to issues with menstural bleeding, pelvic pain if present preoperatively may continue and in fact worsen postoperatively.  She is aware that the procedure will render her unable to pursue childbearing in the future.   She was told that she will be contacted by our surgical scheduler regarding the time and date of her surgery; routine preoperative instructions of having nothing to eat or drink after midnight on the day prior to surgery and also coming to the hospital 1.5 hours prior to her time of surgery were also emphasized.  She was told she may be called for a preoperative appointment about a week prior to surgery and will be given further preoperative instructions at that visit.  Routine postoperative instructions will be reviewed with the patient and her family in detail after surgery. Printed patient education handouts about the procedure was given to the patient to review at home.   2) Routine postoperative instructions were reviewed with the patient and her family in detail today including the expected length of recovery and likely postoperative course.  The patient concurred with the proposed plan, giving informed written consent for the surgery today.  Patient instructed on the importance of being NPO after midnight prior to her procedure.  If warranted preoperative prophylactic antibiotics and SCDs ordered on call to the OR to meet SCIP guidelines and adhere to recommendation laid forth in [place] Number 104 May 2009  "Antibiotic Prophylaxis for Gynecologic Procedures".     Malachy Mood, MD, [facility], [facility] 06/19/2019, 10:45  AM

## 2019-06-22 ENCOUNTER — Telehealth: Payer: Self-pay | Admitting: Obstetrics and Gynecology

## 2019-06-22 NOTE — Telephone Encounter (Signed)
Patient is aware of rescheduled H&P on 1/11 @ 10:10am w/ Dr. Georgianne Fick, Pre-admit testing to be rescheduled, COVID testing on 1/19, and OR on 08/03/19.

## 2019-06-22 NOTE — Telephone Encounter (Signed)
Lmtrc

## 2019-06-23 ENCOUNTER — Other Ambulatory Visit: Payer: Self-pay

## 2019-06-23 ENCOUNTER — Encounter (INDEPENDENT_AMBULATORY_CARE_PROVIDER_SITE_OTHER): Payer: Self-pay | Admitting: Vascular Surgery

## 2019-06-23 ENCOUNTER — Ambulatory Visit (INDEPENDENT_AMBULATORY_CARE_PROVIDER_SITE_OTHER): Payer: Medicare Other | Admitting: Vascular Surgery

## 2019-06-23 ENCOUNTER — Encounter: Payer: Self-pay | Admitting: Internal Medicine

## 2019-06-23 VITALS — BP 136/85 | HR 64 | Resp 16 | Wt 275.8 lb

## 2019-06-23 DIAGNOSIS — I10 Essential (primary) hypertension: Secondary | ICD-10-CM | POA: Diagnosis not present

## 2019-06-23 DIAGNOSIS — N924 Excessive bleeding in the premenopausal period: Secondary | ICD-10-CM

## 2019-06-23 DIAGNOSIS — I82441 Acute embolism and thrombosis of right tibial vein: Secondary | ICD-10-CM | POA: Diagnosis not present

## 2019-06-23 NOTE — Progress Notes (Unsigned)
Patient is here for a follow-up with no complaints or concerns at this time.

## 2019-06-23 NOTE — Assessment & Plan Note (Signed)
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  She has seen hematology who has recommended indefinite anticoagulation which I would certainly agree with at this point.  Her postphlebitic symptoms are nonexistent at this point which is encouraging we discussed if she develops pain or swelling she should consider wearing compression stockings daily, elevating her legs, and increasing her activity.  Hopefully her menorrhagia will be improved with the upcoming gynecologic surgery and long-term anticoagulation should be well-tolerated otherwise.  At this point I have offered her a follow-up next year or as needed and she would prefer the latter.  She will contact our office with problems.

## 2019-06-23 NOTE — Progress Notes (Signed)
MRN : [id number]  Lisa [name] is a 48 y.o. (July 30, 1970) female who presents with chief complaint of  Chief Complaint  Patient presents with  . Follow-up    69month follow up  .  History of Present Illness: Patient returns today in follow up of a calf vein DVT about 3 months ago.  This was an unprovoked event and she has had multiple previous thrombotic episodes.  Although her hypercoagulable panel was unrevealing, given her multiple previous thromboses particularly unprovoked thromboses she has seen hematology who has recommended indefinite anticoagulation.  I would echo that treatment plan.  No real issues since her last visit.  Her legs feel well.  She is not complaining of any pain or swelling currently.  She is still having some menorrhagia and was scheduled for a gynecologic procedure to address this but that has been delayed due to hospital restrictions.  Current Outpatient Medications  Medication Sig Dispense Refill  . AIMOVIG 140 MG/ML SOAJ Inject 140 mg into the skin every 30 (thirty) days.    Marland Kitchen albuterol (PROVENTIL HFA;VENTOLIN HFA) 108 (90 Base) MCG/ACT inhaler Inhale 2 puffs into the lungs every 6 (six) hours as needed for wheezing or shortness of breath. 1 Inhaler 2  . apixaban (ELIQUIS) 5 MG TABS tablet Take 5 mg by mouth 2 (two) times daily.    Marland Kitchen azaTHIOprine (IMURAN) 50 MG tablet Take 50 mg by mouth daily.    . Chromium Picolinate 200 MCG CAPS Take 200 mcg by mouth daily.    . ferrous sulfate 325 (65 FE) MG tablet Take 325 mg by mouth daily with breakfast.    . Ipratropium-Albuterol (COMBIVENT) 20-100 MCG/ACT AERS respimat Inhale 1 puff into the lungs daily.     . medroxyPROGESTERone (PROVERA) 10 MG tablet Take 1 tablet (10 mg total) by mouth daily. 30 tablet 11  . OIL OF OREGANO PO Take 150 mg by mouth daily.    Marland Kitchen omeprazole (PRILOSEC) 40 MG capsule Take 40 mg by mouth daily.    . predniSONE (DELTASONE) 5 MG tablet Take 5 mg by mouth daily.     . propranolol  (INDERAL) 20 MG tablet Take 20 mg by mouth 2 (two) times daily.    . SUMAtriptan (IMITREX) 100 MG tablet Take 100 mg by mouth every 2 (two) hours as needed for migraine.     . Eliquis DVT/PE Starter Pack (ELIQUIS STARTER PACK) 5 MG TABS Take as directed on package: start with two-5mg  tablets twice daily for 7 days. On day 8, switch to one-5mg  tablet twice daily. (Patient not taking: Reported on 06/21/2019) 1 each 2  . HYDROcodone-acetaminophen (NORCO/VICODIN) 5-325 MG tablet Take 1 tablet by mouth every 6 (six) hours as needed. (Patient not taking: Reported on 06/21/2019) 12 tablet 0  . ibuprofen (ADVIL) 600 MG tablet Take 1 tablet (600 mg total) by mouth every 6 (six) hours as needed. (Patient not taking: Reported on 06/21/2019) 60 tablet 3   No current facility-administered medications for this visit.    Past Medical History:  Diagnosis Date  . Abdominal pain    d/t ongoing menstrual bleeding  . Anemia    iron deficiency, receiving Feraheme transfusions  . Asthma    nonspecific interstitial pneumonia.Marland Kitchendiagnosed 6 years ago  . Bipolar 1 disorder ([place])   . Blood in stool    happens often but unsure of etiology.  will have colonoscopy  . Change in voice   . Chest pain    has often but is  due to lung disease  . Constipation   . Depression   . Diverticulitis   . DVT (deep venous thrombosis) ([place]) 12/13/2014  . Dysrhythmia   . Fatigue   . Gallstones 04/10/2011   with biliary pancreatitis  . GERD (gastroesophageal reflux disease)   . History of blood transfusion 02/2019   one unit d/t hgb being low  . Hypertension   . Migraines   . MRSA carrier    many years ago.  . Nausea   . Pancreatitis   . PTSD (post-traumatic stress disorder)   . Pulmonary embolism ([place]) 12/13/2014  . Sleep apnea    does not use cpap now.  will be retested soon  . Takes iron supplements    FERAHEME weekly transfusions as well as ferrous sulfate PO  . Trouble swallowing     Past Surgical History:  Procedure  Laterality Date  . BRAVO [place] STUDY  06/02/2011   Procedure: BRAVO [place] STUDY;  Surgeon: Inda Castle, MD;  Location: [facility];  Service: Endoscopy;  Laterality: N/A;  . CHOLECYSTECTOMY    . DILATION AND CURETTAGE OF UTERUS    . ENDOMETRIAL ABLATION N/A 03/30/2019   Procedure: ENDOMETRIAL ABLATION;  Surgeon: Malachy Mood, MD;  Location: [facility] OR[facility];  Service: Gynecology;  Laterality: N/A;  . ESOPHAGOGASTRODUODENOSCOPY  06/02/2011   Procedure: ESOPHAGOGASTRODUODENOSCOPY (EGD);  Surgeon: Inda Castle, MD;  Location: [facility];  Service: Endoscopy;  Laterality: N/A;  . HYSTEROSCOPY  03/30/2019   Procedure: HYSTEROSCOPY;  Surgeon: Malachy Mood, MD;  Location: [facility] OR[facility];  Service: Gynecology;;  . LAPAROSCOPIC CHOLECYSTECTOMY W/ CHOLANGIOGRAPHY  04/25/2011   Dr Margot Chimes  . NASAL POLYP SURGERY     polyp from vocal cords  . TUBAL LIGATION  1996  . WISDOM TOOTH EXTRACTION      Social History        Tobacco Use  . Smoking status: Never Smoker  . Smokeless tobacco: Never Used  Substance Use Topics  . Alcohol use: Yes    Comment: occ  . Drug use: No    Family History      Family History  Problem Relation Age of Onset  . Diabetes Mother   . Pancreatic cancer Mother   . Lung cancer Father   . Cancer Paternal Aunt breast  . Anesthesia problems Neg Hx   . Hypotension Neg Hx   . Malignant hyperthermia Neg Hx   . Pseudochol deficiency Neg Hx         Allergies  Allergen Reactions  . Lisinopril Anaphylaxis   REVIEW OF SYSTEMS(Negative unless checked)  Constitutional: [] ??Weight loss[] ??Fever[] ??Chills Cardiac:[] ??Chest pain[] ??Chest pressure[] ??Palpitations [] ??Shortness of breath when laying flat [] ??Shortness of breath at rest [] ??Shortness of breath with exertion. Vascular: [x] ??Pain in legs with walking[x] ??Pain in legsat rest[x] ??Pain in legs when laying flat [] ??Claudication [] ??Pain in feet when walking [] ??Pain in  feet at rest [] ??Pain in feet when laying flat [x] ??History of DVT [x] ??Phlebitis [x] ??Swelling in legs [] ??Varicose veins [] ??Non-healing ulcers Pulmonary: [] ??Uses home oxygen [] ??Productive cough[] ??Hemoptysis [] ??Wheeze [] ??COPD [] ??Asthma Neurologic: [] ??Dizziness [] ??Blackouts [] ??Seizures [] ??History of stroke [] ??History of TIA[] ??Aphasia [] ??Temporary blindness[] ??Dysphagia [] ??Weaknessor numbness in arms [] ??Weakness or numbnessin legs Musculoskeletal: [] ??Arthritis [] ??Joint swelling [] ??Joint pain [] ??Low back pain Hematologic:[] ??Easy bruising[] ??Easy bleeding [] ??Hypercoagulable state [x] ??Anemic [] ??Hepatitis Gastrointestinal:[] ??Blood in stool[] ??Vomiting blood[] ??Gastroesophageal reflux/heartburn[] ??Difficulty swallowing. Genitourinary: [] ??Chronic kidney disease [] ??Difficulturination [] ??Frequenturination [] ??Burning with urination[] ??Blood in urine Skin: [] ??Rashes [] ??Ulcers [] ??Wounds Psychological: [] ??History of anxiety[] ??History of major depression.    Physical Examination  BP 136/85 (BP Location: Right Arm)   Pulse 64   Resp 16  Wt 275 lb 12.8 oz (125.1 kg)   LMP 05/24/2019   BMI 41.94 kg/m  Gen:  WD/WN, NAD Head: [place]/AT, No temporalis wasting. Ear/Nose/Throat: Hearing grossly intact, nares w/o erythema or drainage Eyes: Conjunctiva clear. Sclera non-icteric Neck: Supple.  Trachea midline Pulmonary:  Good air movement, no use of accessory muscles.  Cardiac: RRR, no JVD Vascular:  Vessel Right Left  Radial Palpable Palpable                          PT Palpable Palpable  DP Palpable Palpable   Gastrointestinal: soft, non-tender/non-distended. No guarding/reflex.  Musculoskeletal: M/S 5/5 throughout.  No deformity or atrophy.  No appreciable lower extremity edema. Neurologic: Sensation grossly intact in extremities.  Symmetrical.  Speech is fluent.    Psychiatric: Judgment intact, Mood & affect appropriate for pt's clinical situation. Dermatologic: No rashes or ulcers noted.  No cellulitis or open wounds.       Labs Recent Results (from the past 2160 hour(s))  SARS CORONAVIRUS 2 (TAT 6-24 HRS) Nasopharyngeal Nasopharyngeal Swab     Status: None   Collection Time: 03/27/19 10:18 AM   Specimen: Nasopharyngeal Swab  Result Value Ref Range   SARS Coronavirus 2 NEGATIVE NEGATIVE    Comment: (NOTE) SARS-CoV-2 target nucleic acids are NOT DETECTED. The SARS-CoV-2 RNA is generally detectable in upper and lower respiratory specimens during the acute phase of infection. Negative results do not preclude SARS-CoV-2 infection, do not rule out co-infections with other pathogens, and should not be used as the sole basis for treatment or other patient management decisions. Negative results must be combined with clinical observations, patient history, and epidemiological information. The expected result is Negative. Fact Sheet for Patients: SugarRoll.be Fact Sheet for Healthcare Providers: https://www.woods-mathews.com/ This test is not yet approved or cleared by the [place] FDA and  has been authorized for detection and/or diagnosis of SARS-CoV-2 by FDA under an Emergency Use Authorization (EUA). This EUA will remain  in effect (meaning this test can be used) for the duration of the COVID-19 declaration under Section 56 4(b)(1) of the Act, 21 U.S.C. section 360bbb-3(b)(1), unless the authorization is terminated or revoked sooner. Performed at [facility], [place] [address] [place], [place] [address]   Pregnancy, urine POC     Status: None   Collection Time: 03/30/19  7:21 AM  Result Value Ref Range   Preg Test, Ur NEGATIVE NEGATIVE    Comment:        THE SENSITIVITY OF THIS METHODOLOGY IS >24 mIU/mL   Comprehensive metabolic panel     Status: Abnormal   Collection Time: 04/26/19   1:04 PM  Result Value Ref Range   Sodium 139 135 - 145 mmol/L   Potassium 3.4 (L) 3.5 - 5.1 mmol/L   Chloride 106 98 - 111 mmol/L   CO2 27 22 - 32 mmol/L   Glucose, Bld 82 70 - 99 mg/dL   BUN 9 6 - 20 mg/dL   Creatinine, Ser 0.65 0.44 - 1.00 mg/dL   Calcium 8.8 (L) 8.9 - 10.3 mg/dL   Total Protein 7.0 6.5 - 8.1 g/dL   Albumin 3.7 3.5 - 5.0 g/dL   AST 14 (L) 15 - 41 U/L   ALT 14 0 - 44 U/L   Alkaline Phosphatase 36 (L) 38 - 126 U/L   Total Bilirubin 0.5 0.3 - 1.2 mg/dL   GFR calc non Af Amer >60 >60 mL/min   GFR calc  Af Amer >60 >60 mL/min   Anion gap 6 5 - 15    Comment: Performed at [facility], [address] [place], [place] [address]  CBC with Differential/Platelet     Status: Abnormal   Collection Time: 04/26/19  1:04 PM  Result Value Ref Range   WBC 10.3 4.0 - 10.5 K/uL   RBC 4.41 3.87 - 5.11 MIL/uL   Hemoglobin 11.5 (L) 12.0 - 15.0 g/dL   HCT 36.3 36.0 - 46.0 %   MCV 82.3 80.0 - 100.0 fL   MCH 26.1 26.0 - 34.0 pg   MCHC 31.7 30.0 - 36.0 g/dL   RDW 26.8 (H) 11.5 - 15.5 %   Platelets 238 150 - 400 K/uL   nRBC 0.0 0.0 - 0.2 %   Neutrophils Relative % 58 %   Neutro Abs 6.0 1.7 - 7.7 K/uL   Lymphocytes Relative 28 %   Lymphs Abs 2.9 0.7 - 4.0 K/uL   Monocytes Relative 11 %   Monocytes Absolute 1.1 (H) 0.1 - 1.0 K/uL   Eosinophils Relative 2 %   Eosinophils Absolute 0.2 0.0 - 0.5 K/uL   Basophils Relative 1 %   Basophils Absolute 0.1 0.0 - 0.1 K/uL   Immature Granulocytes 0 %   Abs Immature Granulocytes 0.03 0.00 - 0.07 K/uL    Comment: Performed at [facility], [address] [place], [place] [address]  Lactate dehydrogenase     Status: None   Collection Time: 04/26/19  1:04 PM  Result Value Ref Range   LDH 134 98 - 192 U/L    Comment: Performed at [facility], [address] [place], [place] [address]  CBC with Differential     Status: Abnormal   Collection Time: 05/31/19  1:08 AM  Result Value Ref Range   WBC 6.5 4.0 - 10.5 K/uL    RBC 4.84 3.87 - 5.11 MIL/uL   Hemoglobin 13.4 12.0 - 15.0 g/dL   HCT 40.1 36.0 - 46.0 %   MCV 82.9 80.0 - 100.0 fL   MCH 27.7 26.0 - 34.0 pg   MCHC 33.4 30.0 - 36.0 g/dL   RDW 17.6 (H) 11.5 - 15.5 %   Platelets 227 150 - 400 K/uL   nRBC 0.0 0.0 - 0.2 %   Neutrophils Relative % 57 %   Neutro Abs 3.7 1.7 - 7.7 K/uL   Lymphocytes Relative 27 %   Lymphs Abs 1.8 0.7 - 4.0 K/uL   Monocytes Relative 15 %   Monocytes Absolute 0.9 0.1 - 1.0 K/uL   Eosinophils Relative 1 %   Eosinophils Absolute 0.1 0.0 - 0.5 K/uL   Basophils Relative 0 %   Basophils Absolute 0.0 0.0 - 0.1 K/uL   Immature Granulocytes 0 %   Abs Immature Granulocytes 0.02 0.00 - 0.07 K/uL    Comment: Performed at [facility], [address] [place], [place] [address]  SARS CORONAVIRUS 2 (TAT 6-24 HRS) Nasopharyngeal Nasopharyngeal Swab     Status: Abnormal   Collection Time: 05/31/19  1:08 AM   Specimen: Nasopharyngeal Swab  Result Value Ref Range   SARS Coronavirus 2 POSITIVE (A) NEGATIVE    Comment: RESULT CALLED TO, READ BACK BY AND VERIFIED WITH: Lillia Carmel RN 15:10 05/31/19 ([email or web address]) (NOTE) SARS-CoV-2 target nucleic acids are DETECTED. The SARS-CoV-2 RNA is generally detectable in upper and lower respiratory specimens during the acute phase of infection. Positive results are indicative of active infection with SARS-CoV-2. Clinical  correlation  with patient history and other diagnostic information is necessary to determine patient infection status. Positive results do  not rule out bacterial infection or co-infection with other viruses. The expected result is Negative. Fact Sheet for Patients: SugarRoll.be Fact Sheet for Healthcare Providers: https://www.woods-mathews.com/ This test is not yet approved or cleared by the [place] FDA and  has been authorized for detection and/or diagnosis of SARS-CoV-2 by FDA under an Emergency Use Authorization (EUA). This EUA  will remain  in effect (meaning this test can be used) for t he duration of the COVID-19 declaration under Section 564(b)(1) of the Act, 21 U.S.C. section 360bbb-3(b)(1), unless the authorization is terminated or revoked sooner. Performed at [facility], [place] [address], [place], [place] [id number]   Basic metabolic panel     Status: Abnormal   Collection Time: 05/31/19  2:28 AM  Result Value Ref Range   Sodium 140 135 - 145 mmol/L   Potassium 3.4 (L) 3.5 - 5.1 mmol/L   Chloride 110 98 - 111 mmol/L   CO2 23 22 - 32 mmol/L   Glucose, Bld 93 70 - 99 mg/dL   BUN 9 6 - 20 mg/dL   Creatinine, Ser 0.57 0.44 - 1.00 mg/dL   Calcium 8.6 (L) 8.9 - 10.3 mg/dL   GFR calc non Af Amer >60 >60 mL/min   GFR calc Af Amer >60 >60 mL/min   Anion gap 7 5 - 15    Comment: Performed at [facility], [address] [place], [place] [address]  Magnesium     Status: None   Collection Time: 05/31/19  2:28 AM  Result Value Ref Range   Magnesium 2.2 1.7 - 2.4 mg/dL    Comment: Performed at [facility], [address] [place],  [address]    Radiology No results found.  Assessment/Plan Essential hypertension blood pressure control important in reducing the progression of atherosclerotic disease. On appropriate oral medications.  Obesity, Class III, BMI 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) Increases her risk of postphlebitic symptoms.  Menorrhagia Ongoing.  Scheduled for procedure with gynecology that has been postponed due to the hospital restrictions but hopefully will be able to be done in the upcoming weeks.  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  She has seen hematology who has recommended indefinite anticoagulation which I would certainly agree with at this point.  Her postphlebitic symptoms are nonexistent at this point which is encouraging we discussed if she develops pain  or swelling she should consider wearing compression stockings daily, elevating her legs, and increasing her activity.  Hopefully her menorrhagia will be improved with the upcoming gynecologic surgery and long-term anticoagulation should be well-tolerated otherwise.  At this point I have offered her a follow-up next year or as needed and she would prefer the latter.  She will contact our office with problems.    Leotis Pain, MD  06/23/2019 11:36 AM    This note was created with Dragon medical transcription system.  Any errors from dictation are purely unintentional

## 2019-06-26 ENCOUNTER — Inpatient Hospital Stay: Payer: Medicare Other | Attending: Internal Medicine

## 2019-06-26 ENCOUNTER — Inpatient Hospital Stay: Payer: Medicare Other | Admitting: Internal Medicine

## 2019-06-26 ENCOUNTER — Inpatient Hospital Stay: Payer: Medicare Other

## 2019-06-26 ENCOUNTER — Other Ambulatory Visit: Payer: Medicare Other

## 2019-06-26 NOTE — Assessment & Plan Note (Signed)
#   Acute DVT- right posterior popliteal vein currently Eliquis.  Unprovoked/ prior history of question provoked DVT PE in 2016. Recommend long term/indefinite anti-coagulation.  Reviewed her hypercoagulable work-up negative-any putative causes.  #Given the recurrent DVT -I think is reasonable to consider long-term anticoagulation.  Discussed with the patient potential pros and cons long-term anticoagulation.  Discussed regarding avoiding falls/trauma on blood thinners.  Also counseled the patient regarding need for bridging/Lovenox perioperatively if surgery is needed.  Patient will call us prior to any surgical planning.  #Iron deficient anemia-chronic menorrhagia-s/p Ferrahem x3; improved-11.5.  Wants to hold off because of poor access.  #Chronic menorrhagia-on medroxyprogresterone as per gynecology.  As per patient next it would be hysterectomy.  # DISPOSITION: # HOLD Ferrahem today. # follow up in 2 month -MD-labs; cbc/possible Ferrahem-Dr.B

## 2019-06-26 NOTE — Progress Notes (Unsigned)
[facility] CONSULT NOTE  Patient Care Team: Patient, No Pcp Per as PCP - General ([place]) Cammie Sickle, MD as Consulting Physician (Oncology) Cammie Sickle, MD as Consulting Physician (Oncology)  CHIEF COMPLAINTS/PURPOSE OF CONSULTATION:    HEMATOLOGY HISTORY  # August 2020 acute DVT right posterior tibial vein/calf; superficial thrombosis-Eliquis; SEP 2020-hypercoagulable work-up negative long term/indefinite anti-coagulation.   # FEB 2016- PULMONARY EMBOLISM/ hx R calf DVT [Dr.Gittin s/p surgery on lung anticoagulation for 6 months  # AUG 2020- Chronic iron deficient anemia-menorrhagia [Dr.Stabler]; Ferritin-5colo- ? 2 years ago.   # Chronic Lung disease-pneumonitis/fibrosis/ Dr.Fleming  HISTORY OF PRESENTING ILLNESS: Poor historian  Lisa [name] 48 y.o.  female with right calf DVT/anemia-iron deficiency is here for follow-up.  Patient received IV Feraheme infusion x3-noted to have slight improvement of her energy levels.  However she continues to be short of breath on exertion.  Patient continues been Eliquis for recent DVT.  Tolerating fairly well.  She continues to be on oral iron.  Review of Systems  Constitutional: Positive for malaise/fatigue. Negative for chills, diaphoresis, fever and weight loss.  HENT: Negative for nosebleeds and sore throat.   Eyes: Negative for double vision.  Respiratory: Positive for shortness of breath. Negative for cough, hemoptysis, sputum production and wheezing.   Cardiovascular: Negative for chest pain, palpitations, orthopnea and leg swelling.  Gastrointestinal: Negative for abdominal pain, blood in stool, constipation, diarrhea, heartburn, melena, nausea and vomiting.       Black stools (on iron pills)  Genitourinary: Negative for dysuria, frequency and urgency.  Musculoskeletal: Negative for back pain and joint pain.  Skin: Negative.  Negative for itching and rash.  Neurological: Positive  for dizziness. Negative for tingling, focal weakness, weakness and headaches.  Endo/Heme/Allergies: Does not bruise/bleed easily.  Psychiatric/Behavioral: Negative for depression. The patient is not nervous/anxious and does not have insomnia.     MEDICAL HISTORY:  Past Medical History:  Diagnosis Date  . Abdominal pain    d/t ongoing menstrual bleeding  . Anemia    iron deficiency, receiving Feraheme transfusions  . Asthma    nonspecific interstitial pneumonia.Marland Kitchendiagnosed 6 years ago  . Bipolar 1 disorder ([place])   . Blood in stool    happens often but unsure of etiology.  will have colonoscopy  . Change in voice   . Chest pain    has often but is due to lung disease  . Constipation   . Depression   . Diverticulitis   . DVT (deep venous thrombosis) ([place]) 12/13/2014  . Dysrhythmia   . Fatigue   . Gallstones 04/10/2011   with biliary pancreatitis  . GERD (gastroesophageal reflux disease)   . History of blood transfusion 02/2019   one unit d/t hgb being low  . Hypertension   . Migraines   . MRSA carrier    many years ago.  . Nausea   . Pancreatitis   . PTSD (post-traumatic stress disorder)   . Pulmonary embolism ([place]) 12/13/2014  . Sleep apnea    does not use cpap now.  will be retested soon  . Takes iron supplements    FERAHEME weekly transfusions as well as ferrous sulfate PO  . Trouble swallowing     SURGICAL HISTORY: Past Surgical History:  Procedure Laterality Date  . BRAVO [place] STUDY  06/02/2011   Procedure: BRAVO [place] STUDY;  Surgeon: Inda Castle, MD;  Location: [facility];  Service: Endoscopy;  Laterality: N/A;  . CHOLECYSTECTOMY    .  DILATION AND CURETTAGE OF UTERUS    . ENDOMETRIAL ABLATION N/A 03/30/2019   Procedure: ENDOMETRIAL ABLATION;  Surgeon: Malachy Mood, MD;  Location: [facility] ORS;  Service: Gynecology;  Laterality: N/A;  . ESOPHAGOGASTRODUODENOSCOPY  06/02/2011   Procedure: ESOPHAGOGASTRODUODENOSCOPY (EGD);  Surgeon: Inda Castle, MD;   Location: [facility];  Service: Endoscopy;  Laterality: N/A;  . HYSTEROSCOPY  03/30/2019   Procedure: HYSTEROSCOPY;  Surgeon: Malachy Mood, MD;  Location: [facility] ORS;  Service: Gynecology;;  . LAPAROSCOPIC CHOLECYSTECTOMY W/ CHOLANGIOGRAPHY  04/25/2011   Dr Margot Chimes  . NASAL POLYP SURGERY     polyp from vocal cords  . TUBAL LIGATION  1996  . WISDOM TOOTH EXTRACTION      SOCIAL HISTORY: Social History   Socioeconomic History  . Marital status: Single    Spouse name: Not on file  . Number of children: 7  . Years of education: Not on file  . Highest education level: Not on file  Occupational History    Comment: disability  Tobacco Use  . Smoking status: Never Smoker  . Smokeless tobacco: Never Used  Substance and Sexual Activity  . Alcohol use: Yes    Comment: occ  . Drug use: No  . Sexual activity: Yes    Birth control/protection: Surgical  Other Topics Concern  . Not on file  Social History Narrative   In [place]; with 2 sons; used to work in hospitality. Never smoked; ocassional alcohol.    Social Determinants of Health   Financial Resource Strain:   . Difficulty of Paying Living Expenses: Not on file  Food Insecurity:   . Worried About Charity fundraiser in the Last Year: Not on file  . Ran Out of Food in the Last Year: Not on file  Transportation Needs:   . Lack of Transportation (Medical): Not on file  . Lack of Transportation (Non-Medical): Not on file  Physical Activity:   . Days of Exercise per Week: Not on file  . Minutes of Exercise per Session: Not on file  Stress:   . Feeling of Stress : Not on file  Social Connections:   . Frequency of Communication with Friends and Family: Not on file  . Frequency of Social Gatherings with Friends and Family: Not on file  . Attends Religious Services: Not on file  . Active Member of Clubs or Organizations: Not on file  . Attends Archivist Meetings: Not on file  . Marital Status: Not on file  Intimate  Partner Violence:   . Fear of Current or Ex-Partner: Not on file  . Emotionally Abused: Not on file  . Physically Abused: Not on file  . Sexually Abused: Not on file    FAMILY HISTORY: Family History  Problem Relation Age of Onset  . Diabetes Mother   . Pancreatic cancer Mother   . Lung cancer Father   . Cancer Paternal Aunt breast  . Anesthesia problems Neg Hx   . Hypotension Neg Hx   . Malignant hyperthermia Neg Hx   . Pseudochol deficiency Neg Hx     ALLERGIES:  is allergic to lisinopril.  MEDICATIONS:  Current Outpatient Medications  Medication Sig Dispense Refill  . AIMOVIG 140 MG/ML SOAJ Inject 140 mg into the skin every 30 (thirty) days.    Marland Kitchen albuterol (PROVENTIL HFA;VENTOLIN HFA) 108 (90 Base) MCG/ACT inhaler Inhale 2 puffs into the lungs every 6 (six) hours as needed for wheezing or shortness of breath. 1 Inhaler 2  . apixaban (ELIQUIS)  5 MG TABS tablet Take 5 mg by mouth 2 (two) times daily.    Marland Kitchen azaTHIOprine (IMURAN) 50 MG tablet Take 50 mg by mouth daily.    . Chromium Picolinate 200 MCG CAPS Take 200 mcg by mouth daily.    . ferrous sulfate 325 (65 FE) MG tablet Take 325 mg by mouth daily with breakfast.    . Ipratropium-Albuterol (COMBIVENT) 20-100 MCG/ACT AERS respimat Inhale 1 puff into the lungs daily.     . medroxyPROGESTERone (PROVERA) 10 MG tablet Take 1 tablet (10 mg total) by mouth daily. 30 tablet 11  . OIL OF OREGANO PO Take 150 mg by mouth daily.    Marland Kitchen omeprazole (PRILOSEC) 40 MG capsule Take 40 mg by mouth daily.    . predniSONE (DELTASONE) 5 MG tablet Take 5 mg by mouth daily.     . propranolol (INDERAL) 20 MG tablet Take 20 mg by mouth 2 (two) times daily.    . Eliquis DVT/PE Starter Pack (ELIQUIS STARTER PACK) 5 MG TABS Take as directed on package: start with two-5mg  tablets twice daily for 7 days. On day 8, switch to one-5mg  tablet twice daily. (Patient not taking: Reported on 06/23/2019) 1 each 2  . HYDROcodone-acetaminophen (NORCO/VICODIN) 5-325 MG  tablet Take 1 tablet by mouth every 6 (six) hours as needed. (Patient not taking: Reported on 06/23/2019) 12 tablet 0  . ibuprofen (ADVIL) 600 MG tablet Take 1 tablet (600 mg total) by mouth every 6 (six) hours as needed. (Patient not taking: Reported on 06/21/2019) 60 tablet 3  . SUMAtriptan (IMITREX) 100 MG tablet Take 100 mg by mouth every 2 (two) hours as needed for migraine.      No current facility-administered medications for this visit.      PHYSICAL EXAMINATION:   There were no vitals filed for this visit. There were no vitals filed for this visit.  Physical Exam  Constitutional: She is oriented to person, place, and time and well-developed, well-nourished, and in no distress.  Obese.  HENT:  Head: Normocephalic and atraumatic.  Mouth/Throat: Oropharynx is clear and moist. No oropharyngeal exudate.  Eyes: Pupils are equal, round, and reactive to light.  Cardiovascular: Normal rate and regular rhythm.  Pulmonary/Chest: Effort normal and breath sounds normal. No respiratory distress. She has no wheezes.  Abdominal: Soft. Bowel sounds are normal. She exhibits no distension and no mass. There is no abdominal tenderness. There is no rebound and no guarding.  Musculoskeletal:        General: No tenderness or edema. Normal range of motion.     Cervical back: Normal range of motion and neck supple.  Neurological: She is alert and oriented to person, place, and time.  Skin: Skin is warm.  Psychiatric: Affect normal.    LABORATORY DATA:  I have reviewed the data as listed Lab Results  Component Value Date   WBC 6.5 05/31/2019   HGB 13.4 05/31/2019   HCT 40.1 05/31/2019   MCV 82.9 05/31/2019   PLT 227 05/31/2019   Recent Labs    03/02/19 2304 03/03/19 2051 04/26/19 1304 05/31/19 0228  NA 136 139 139 140  K 3.8 4.0 3.4* 3.4*  CL 107 107 106 110  CO2 24 23 27 23   GLUCOSE 102* 107* 82 93  BUN 8 9 9 9   CREATININE 0.71 0.73 0.65 0.57  CALCIUM 8.9 9.3 8.8* 8.6*   GFRNONAA >60 >60 >60 >60  GFRAA >60 >60 >60 >60  PROT 7.7  --  7.0  --  ALBUMIN 3.9  --  3.7  --   AST 15  --  14*  --   ALT 12  --  14  --   ALKPHOS 42  --  36*  --   BILITOT 0.4  --  0.5  --      No results found.  No problem-specific Assessment & Plan notes found for this encounter.  All questions were answered. The patient knows to call the clinic with any problems, questions or concerns.    Cammie Sickle, MD 06/26/2019 8:32 AM

## 2019-07-12 ENCOUNTER — Telehealth: Payer: Self-pay | Admitting: Obstetrics and Gynecology

## 2019-07-12 ENCOUNTER — Other Ambulatory Visit: Payer: Self-pay | Admitting: Obstetrics and Gynecology

## 2019-07-12 DIAGNOSIS — N939 Abnormal uterine and vaginal bleeding, unspecified: Secondary | ICD-10-CM

## 2019-07-12 NOTE — Telephone Encounter (Signed)
Patient is aware of rescheduled surgery to 08/15/19, due to overnight stays prior to 2/1 being rescheduled.

## 2019-07-12 NOTE — Telephone Encounter (Signed)
No answer, she can come her Hgb was fine end of November, she as not anemic.  We can recheck a CBC to verify she is staying stable.  Order has been placed she just need an appointment time to come get the lab drawn   Malachy Mood, MD, [place], [place] 07/12/2019, 5:10 PM

## 2019-07-13 NOTE — Telephone Encounter (Signed)
Patient is schedule 07/17/19

## 2019-07-14 HISTORY — PX: GASTRIC BYPASS: SHX52

## 2019-07-17 ENCOUNTER — Other Ambulatory Visit: Payer: Self-pay

## 2019-07-17 ENCOUNTER — Other Ambulatory Visit: Payer: Medicare Other

## 2019-07-17 DIAGNOSIS — N939 Abnormal uterine and vaginal bleeding, unspecified: Secondary | ICD-10-CM

## 2019-07-18 LAB — CBC
Hematocrit: 38.1 % (ref 34.0–46.6)
Hemoglobin: 12.9 g/dL (ref 11.1–15.9)
MCH: 29.5 pg (ref 26.6–33.0)
MCHC: 33.9 g/dL (ref 31.5–35.7)
MCV: 87 fL (ref 79–97)
Platelets: 258 10*3/uL (ref 150–450)
RBC: 4.37 x10E6/uL (ref 3.77–5.28)
RDW: 13.3 % (ref 11.7–15.4)
WBC: 10 10*3/uL (ref 3.4–10.8)

## 2019-07-24 ENCOUNTER — Encounter: Payer: Medicare Other | Admitting: Obstetrics and Gynecology

## 2019-07-25 ENCOUNTER — Inpatient Hospital Stay: Admission: RE | Admit: 2019-07-25 | Payer: Medicare Other | Source: Ambulatory Visit

## 2019-07-31 ENCOUNTER — Other Ambulatory Visit: Payer: Medicare Other

## 2019-07-31 ENCOUNTER — Encounter: Payer: Self-pay | Admitting: Obstetrics and Gynecology

## 2019-07-31 ENCOUNTER — Other Ambulatory Visit: Payer: Self-pay

## 2019-07-31 ENCOUNTER — Ambulatory Visit (INDEPENDENT_AMBULATORY_CARE_PROVIDER_SITE_OTHER): Payer: Medicare Other | Admitting: Obstetrics and Gynecology

## 2019-07-31 VITALS — BP 132/88 | HR 54 | Ht 68.0 in | Wt 278.0 lb

## 2019-07-31 DIAGNOSIS — N939 Abnormal uterine and vaginal bleeding, unspecified: Secondary | ICD-10-CM | POA: Diagnosis not present

## 2019-07-31 DIAGNOSIS — Z01818 Encounter for other preprocedural examination: Secondary | ICD-10-CM | POA: Diagnosis not present

## 2019-07-31 NOTE — Progress Notes (Signed)
Obstetrics & Gynecology Surgery H&P    Chief Complaint: Scheduled Surgery   History of Present Illness: Patient is a 49 y.o. No obstetric history on file. presenting for scheduled TLH, BS, cystoscopy, for the treatment or further evaluation of abnormal uterine bleeding.   Prior Treatments prior to proceeding with surgery include: progestins, novasure endometrial ablation 03/30/2019  Preoperative Pap: 03/14/2019 NIL Preoperative Endometrial biopsy: 03/14/2019 secretory endometrium Preoperative Ultrasound: 03/14/2019 normal uterus   Review of Systems:10 point review of systems  Past Medical History:  Past Medical History:  Diagnosis Date  . Abdominal pain    d/t ongoing menstrual bleeding  . Anemia    iron deficiency, receiving Feraheme transfusions  . Asthma    nonspecific interstitial pneumonia.Marland Kitchendiagnosed 6 years ago  . Bipolar 1 disorder ([place])   . Blood in stool    happens often but unsure of etiology.  will have colonoscopy  . Change in voice   . Chest pain    has often but is due to lung disease  . Constipation   . Depression   . Diverticulitis   . DVT (deep venous thrombosis) ([place]) 12/13/2014  . Dysrhythmia   . Fatigue   . Gallstones 04/10/2011   with biliary pancreatitis  . GERD (gastroesophageal reflux disease)   . History of blood transfusion 02/2019   one unit d/t hgb being low  . Hypertension   . Migraines   . MRSA carrier    many years ago.  . Nausea   . Pancreatitis   . PTSD (post-traumatic stress disorder)   . Pulmonary embolism ([place]) 12/13/2014  . Sleep apnea    does not use cpap now.  will be retested soon  . Takes iron supplements    FERAHEME weekly transfusions as well as ferrous sulfate PO  . Trouble swallowing     Past Surgical History:  Past Surgical History:  Procedure Laterality Date  . BRAVO [place] STUDY  06/02/2011   Procedure: BRAVO [place] STUDY;  Surgeon: Inda Castle, MD;  Location: [facility];  Service: Endoscopy;  Laterality: N/A;  .  CHOLECYSTECTOMY    . DILATION AND CURETTAGE OF UTERUS    . ENDOMETRIAL ABLATION N/A 03/30/2019   Procedure: ENDOMETRIAL ABLATION;  Surgeon: Malachy Mood, MD;  Location: [facility] ORS;  Service: Gynecology;  Laterality: N/A;  . ESOPHAGOGASTRODUODENOSCOPY  06/02/2011   Procedure: ESOPHAGOGASTRODUODENOSCOPY (EGD);  Surgeon: Inda Castle, MD;  Location: [facility];  Service: Endoscopy;  Laterality: N/A;  . HYSTEROSCOPY  03/30/2019   Procedure: HYSTEROSCOPY;  Surgeon: Malachy Mood, MD;  Location: [facility] ORS;  Service: Gynecology;;  . LAPAROSCOPIC CHOLECYSTECTOMY W/ CHOLANGIOGRAPHY  04/25/2011   Dr Margot Chimes  . NASAL POLYP SURGERY     polyp from vocal cords  . TUBAL LIGATION  1996  . WISDOM TOOTH EXTRACTION      Family History:  Family History  Problem Relation Age of Onset  . Diabetes Mother   . Pancreatic cancer Mother   . Lung cancer Father   . Cancer Paternal Aunt breast  . Anesthesia problems Neg Hx   . Hypotension Neg Hx   . Malignant hyperthermia Neg Hx   . Pseudochol deficiency Neg Hx     Social History:  Social History   Socioeconomic History  . Marital status: Single    Spouse name: Not on file  . Number of children: 7  . Years of education: Not on file  . Highest education level: Not on file  Occupational History    Comment:  disability  Tobacco Use  . Smoking status: Never Smoker  . Smokeless tobacco: Never Used  Substance and Sexual Activity  . Alcohol use: Yes    Comment: occ  . Drug use: No  . Sexual activity: Yes    Birth control/protection: Surgical  Other Topics Concern  . Not on file  Social History Narrative   In [place]; with 2 sons; used to work in hospitality. Never smoked; ocassional alcohol.    Social Determinants of Health   Financial Resource Strain:   . Difficulty of Paying Living Expenses: Not on file  Food Insecurity:   . Worried About Charity fundraiser in the Last Year: Not on file  . Ran Out of Food in the Last Year: Not on file   Transportation Needs:   . Lack of Transportation (Medical): Not on file  . Lack of Transportation (Non-Medical): Not on file  Physical Activity:   . Days of Exercise per Week: Not on file  . Minutes of Exercise per Session: Not on file  Stress:   . Feeling of Stress : Not on file  Social Connections:   . Frequency of Communication with Friends and Family: Not on file  . Frequency of Social Gatherings with Friends and Family: Not on file  . Attends Religious Services: Not on file  . Active Member of Clubs or Organizations: Not on file  . Attends Archivist Meetings: Not on file  . Marital Status: Not on file  Intimate Partner Violence:   . Fear of Current or Ex-Partner: Not on file  . Emotionally Abused: Not on file  . Physically Abused: Not on file  . Sexually Abused: Not on file    Allergies:  Allergies  Allergen Reactions  . Lisinopril Anaphylaxis    Facial swelling, difficulty breathing    Medications: Prior to Admission medications   Medication Sig Start Date End Date Taking? Authorizing Provider  AIMOVIG 140 MG/ML SOAJ Inject 140 mg into the skin every 30 (thirty) days. 02/19/19   [provider]  albuterol (PROVENTIL HFA;VENTOLIN HFA) 108 (90 Base) MCG/ACT inhaler Inhale 2 puffs into the lungs every 6 (six) hours as needed for wheezing or shortness of breath. 02/28/17   Rudene Re, MD  apixaban (ELIQUIS) 5 MG TABS tablet Take 5 mg by mouth 2 (two) times daily.    [provider]  azaTHIOprine (IMURAN) 50 MG tablet Take 50 mg by mouth daily. 02/02/19   [provider]  Chromium Picolinate 200 MCG CAPS Take 200 mcg by mouth daily.    [provider]  Eliquis DVT/PE Starter Pack (ELIQUIS STARTER PACK) 5 MG TABS Take as directed on package: start with two-5mg  tablets twice daily for 7 days. On day 8, switch to one-5mg  tablet twice daily. Patient not taking: Reported on 06/23/2019 03/15/19   Cammie Sickle, MD  ferrous  sulfate 325 (65 FE) MG tablet Take 325 mg by mouth daily with breakfast.    [provider]  HYDROcodone-acetaminophen (NORCO/VICODIN) 5-325 MG tablet Take 1 tablet by mouth every 6 (six) hours as needed. Patient not taking: Reported on 06/23/2019 03/30/19   Malachy Mood, MD  ibuprofen (ADVIL) 600 MG tablet Take 1 tablet (600 mg total) by mouth every 6 (six) hours as needed. Patient not taking: Reported on 06/21/2019 03/30/19   Malachy Mood, MD  Ipratropium-Albuterol (COMBIVENT) 20-100 MCG/ACT AERS respimat Inhale 1 puff into the lungs daily.     [provider]  medroxyPROGESTERone (PROVERA) 10 MG  tablet Take 1 tablet (10 mg total) by mouth daily. 04/26/19   Malachy Mood, MD  OIL OF OREGANO PO Take 150 mg by mouth daily.    [provider]  omeprazole (PRILOSEC) 40 MG capsule Take 40 mg by mouth daily. 01/03/19   [provider]  predniSONE (DELTASONE) 5 MG tablet Take 5 mg by mouth daily.     [provider]  propranolol (INDERAL) 20 MG tablet Take 20 mg by mouth 2 (two) times daily. 01/03/19 01/03/20  [provider]  SUMAtriptan (IMITREX) 100 MG tablet Take 100 mg by mouth every 2 (two) hours as needed for migraine.  02/10/19   [provider]    Physical Exam Vitals: Blood pressure 132/88, pulse (!) 54, height 5\' 8"  (1.727 m), weight 278 lb (126.1 kg).  General: NAD HEENT: normocephalic, anicteric Pulmonary: No increased work of breathing, CTAB Cardiovascular: RRR, distal pulses 2+ Abdomen:  Soft, non-tender, non-distended Extremities: no edema, erythema, or tenderness Neurologic: Grossly intact Psychiatric: mood appropriate, affect full  Imaging No results found.  Assessment: 49 y.o. No obstetric history on file. presenting for scheduled TLH, BS, cystoscopy for AUB   Plan: 1) Patient opts for definitive surgical management via hysterectomy. The risks of surgery were discussed in detail with the patient  including but not limited to: bleeding which may require transfusion or reoperation; infection which may require antibiotics; injury to bowel, bladder, ureters or other surrounding organs (With a literature reported rate of urinary tract injury of 1% quoted); need for additional procedures including laparotomy; thromboembolic phenomenon, incisional problems and other postoperative/anesthesia complications.  Patient was also advised that recovery procedure generally involves an overnight stay; and the  expected recovery time after a hysterectomy being in the range of 6-8 weeks.  Likelihood of success in alleviating the patient's symptoms was discussed.  While definitive in regards to issues with menstural bleeding, pelvic pain if present preoperatively may continue and in fact worsen postoperatively.  She is aware that the procedure will render her unable to pursue childbearing in the future.   She was told that she will be contacted by our surgical scheduler regarding the time and date of her surgery; routine preoperative instructions of having nothing to eat or drink after midnight on the day prior to surgery and also coming to the hospital 1.5 hours prior to her time of surgery were also emphasized.  She was told she may be called for a preoperative appointment about a week prior to surgery and will be given further preoperative instructions at that visit.  Routine postoperative instructions will be reviewed with the patient and her family in detail after surgery. Printed patient education handouts about the procedure was given to the patient to review at home.  2) Routine postoperative instructions were reviewed with the patient and her family in detail today including the expected length of recovery and likely postoperative course.  The patient concurred with the proposed plan, giving informed written consent for the surgery today.  Patient instructed on the importance of being NPO after midnight prior to her  procedure.  If warranted preoperative prophylactic antibiotics and SCDs ordered on call to the OR to meet SCIP guidelines and adhere to recommendation laid forth in [place] Number 104 May 2009  "Antibiotic Prophylaxis for Gynecologic Procedures".     Malachy Mood, MD, [facility], [facility] 07/31/2019, 1:27 PM

## 2019-08-04 ENCOUNTER — Other Ambulatory Visit: Payer: Self-pay

## 2019-08-04 ENCOUNTER — Encounter
Admission: RE | Admit: 2019-08-04 | Discharge: 2019-08-04 | Disposition: A | Discharge status: Home / Self Care | Payer: Medicare Other | Source: Ambulatory Visit | Attending: Obstetrics and Gynecology | Admitting: Obstetrics and Gynecology

## 2019-08-04 HISTORY — DX: Idiopathic non-specific interstitial pneumonitis: J84.113

## 2019-08-04 NOTE — Patient Instructions (Addendum)
Your procedure is scheduled on: 08/15/19 Report to [facility]. To find out your arrival time please call [phone] between 1PM - 3PM on 08/14/19.  Remember: Instructions that are not followed completely may result in serious medical risk, up to and including death, or upon the discretion of your surgeon and anesthesiologist your surgery may need to be rescheduled.     _X__ 1. Do not eat food after midnight the night before your procedure.                 No gum chewing or hard candies. You may drink clear liquids up to 2 hours                 before you are scheduled to arrive for your surgery- DO not drink clear                 liquids within 2 hours of the start of your surgery.                 Clear Liquids include:  water, apple juice without pulp, clear carbohydrate                 drink such as Clearfast or Gatorade, Black Coffee or Tea (Do not add                 anything to coffee or tea). Diabetics water only  __X__2.  On the morning of surgery brush your teeth with toothpaste and water, you                 may rinse your mouth with mouthwash if you wish.  Do not swallow any              toothpaste of mouthwash.     _X__ 3.  No Alcohol for 24 hours before or after surgery.   _X__ 4.  Do Not Smoke or use e-cigarettes For 24 Hours Prior to Your Surgery.                 Do not use any chewable tobacco products for at least 6 hours prior to                 surgery.  ____  5.  Bring all medications with you on the day of surgery if instructed.   __X__  6.  Notify your doctor if there is any change in your medical condition      (cold, fever, infections).     Do not wear jewelry, make-up, hairpins, clips or nail polish. Do not wear lotions, powders, or perfumes.  Do not shave 48 hours prior to surgery. Men may shave face and neck. Do not bring valuables to the hospital.    [facility] is not responsible for any belongings or  valuables.  Contacts, dentures/partials or body piercings may not be worn into surgery. Bring a case for your contacts, glasses or hearing aids, a denture cup will be supplied. Leave your suitcase in the car. After surgery it may be brought to your room. For patients admitted to the hospital, discharge time is determined by your treatment team.   Patients discharged the day of surgery will not be allowed to drive home.   Please read over the following fact sheets that you were given:   MRSA Information  __X__ Take these medicines the morning of surgery with A SIP OF WATER:  1. OMEPRAZOLE  2. PROPRANOLOL  3. AZATHIOPRINE  4. PREDNISONE  5.  6.  ____ Fleet Enema (as directed)   __X__ Use CHG Soap/SAGE wipes as directed  __X__ Use inhalers on the day of surgery  ____ Stop metformin/Janumet/Farxiga 2 days prior to surgery    ____ Take 1/2 of usual insulin dose the night before surgery. No insulin the morning          of surgery.   __X__ Stop Blood Thinners Coumadin/Plavix/Xarelto/Pleta/Pradaxa/Eliquis/Effient/Aspirin 5 DAYS PRIOR TO SURGERY  Or contact your Surgeon, Cardiologist or Medical Doctor regarding  ability to stop your blood thinners  __X__ Stop Anti-inflammatories 7 days before surgery such as Advil, Ibuprofen, Motrin,  BC or Goodies Powder, Naprosyn, Naproxen, Aleve, Aspirin    __X__ Stop all herbal supplements, fish oil or vitamin E until after surgery.    ____ Bring C-Pap to the hospital.    ENSURE PRE SURGERY CARB DRINK TO BE COMPLETED 2 HOURS BEFORE YOUR ARRIVAL TO SURGERY

## 2019-08-07 ENCOUNTER — Other Ambulatory Visit
Admission: RE | Admit: 2019-08-07 | Discharge: 2019-08-07 | Disposition: A | Discharge status: Home / Self Care | Payer: Medicare Other | Attending: Obstetrics and Gynecology | Admitting: Obstetrics and Gynecology

## 2019-08-07 DIAGNOSIS — N939 Abnormal uterine and vaginal bleeding, unspecified: Secondary | ICD-10-CM | POA: Insufficient documentation

## 2019-08-07 LAB — CBC WITH DIFFERENTIAL/PLATELET
Abs Immature Granulocytes: 0.03 10*3/uL (ref 0.00–0.07)
Basophils Absolute: 0.1 10*3/uL (ref 0.0–0.1)
Basophils Relative: 1 %
Eosinophils Absolute: 0.3 10*3/uL (ref 0.0–0.5)
Eosinophils Relative: 3 %
HCT: 37.9 % (ref 36.0–46.0)
Hemoglobin: 12.9 g/dL (ref 12.0–15.0)
Immature Granulocytes: 0 %
Lymphocytes Relative: 23 %
Lymphs Abs: 2.4 10*3/uL (ref 0.7–4.0)
MCH: 29.4 pg (ref 26.0–34.0)
MCHC: 34 g/dL (ref 30.0–36.0)
MCV: 86.3 fL (ref 80.0–100.0)
Monocytes Absolute: 0.9 10*3/uL (ref 0.1–1.0)
Monocytes Relative: 9 %
Neutro Abs: 6.9 10*3/uL (ref 1.7–7.7)
Neutrophils Relative %: 64 %
Platelets: 247 10*3/uL (ref 150–400)
RBC: 4.39 MIL/uL (ref 3.87–5.11)
RDW: 13.5 % (ref 11.5–15.5)
WBC: 10.6 10*3/uL — ABNORMAL HIGH (ref 4.0–10.5)
nRBC: 0 % (ref 0.0–0.2)

## 2019-08-09 ENCOUNTER — Other Ambulatory Visit
Admission: RE | Admit: 2019-08-09 | Discharge: 2019-08-09 | Disposition: A | Discharge status: Home / Self Care | Payer: Medicare Other | Source: Ambulatory Visit | Attending: Obstetrics and Gynecology | Admitting: Obstetrics and Gynecology

## 2019-08-09 ENCOUNTER — Other Ambulatory Visit: Payer: Self-pay

## 2019-08-09 DIAGNOSIS — Z01812 Encounter for preprocedural laboratory examination: Secondary | ICD-10-CM | POA: Insufficient documentation

## 2019-08-09 LAB — TYPE AND SCREEN
ABO/RH(D): B POS
Antibody Screen: NEGATIVE

## 2019-08-09 LAB — BASIC METABOLIC PANEL
Anion gap: 7 (ref 5–15)
BUN: 11 mg/dL (ref 6–20)
CO2: 26 mmol/L (ref 22–32)
Calcium: 9.1 mg/dL (ref 8.9–10.3)
Chloride: 107 mmol/L (ref 98–111)
Creatinine, Ser: 0.63 mg/dL (ref 0.44–1.00)
GFR calc Af Amer: >60 mL/min (ref >60)
GFR calc non Af Amer: >60 mL/min (ref >60)
Glucose, Bld: 84 mg/dL (ref 70–99)
Potassium: 3.8 mmol/L (ref 3.5–5.1)
Sodium: 140 mmol/L (ref 135–145)

## 2019-08-09 LAB — CBC
HCT: 38.2 % (ref 36.0–46.0)
Hemoglobin: 12.7 g/dL (ref 12.0–15.0)
MCH: 29.2 pg (ref 26.0–34.0)
MCHC: 33.2 g/dL (ref 30.0–36.0)
MCV: 87.8 fL (ref 80.0–100.0)
Platelets: 223 10*3/uL (ref 150–400)
RBC: 4.35 MIL/uL (ref 3.87–5.11)
RDW: 13.5 % (ref 11.5–15.5)
WBC: 10.4 10*3/uL (ref 4.0–10.5)
nRBC: 0 % (ref 0.0–0.2)

## 2019-08-14 MED ORDER — DEXTROSE 5 % IV SOLN
3.0000 g | INTRAVENOUS | Status: AC
  Administered 2019-08-15: 3 g via INTRAVENOUS
  Filled 2019-08-14: qty 3000

## 2019-08-15 ENCOUNTER — Observation Stay: Payer: Medicare Other

## 2019-08-15 ENCOUNTER — Encounter: Payer: Self-pay | Admitting: Obstetrics and Gynecology

## 2019-08-15 ENCOUNTER — Other Ambulatory Visit: Payer: Self-pay

## 2019-08-15 ENCOUNTER — Encounter
Admission: RE | Disposition: A | Discharge status: Home / Self Care | Payer: Self-pay | Source: Home / Self Care | Attending: Obstetrics and Gynecology

## 2019-08-15 ENCOUNTER — Observation Stay
Admission: RE | Admit: 2019-08-15 | Discharge: 2019-08-16 | Disposition: A | Discharge status: Home / Self Care | Payer: Medicare Other | Attending: Obstetrics and Gynecology | Admitting: Obstetrics and Gynecology

## 2019-08-15 DIAGNOSIS — F209 Schizophrenia, unspecified: Secondary | ICD-10-CM | POA: Diagnosis not present

## 2019-08-15 DIAGNOSIS — N920 Excessive and frequent menstruation with regular cycle: Secondary | ICD-10-CM | POA: Diagnosis present

## 2019-08-15 DIAGNOSIS — Z6841 Body Mass Index (BMI) 40.0 and over, adult: Secondary | ICD-10-CM | POA: Insufficient documentation

## 2019-08-15 DIAGNOSIS — K219 Gastro-esophageal reflux disease without esophagitis: Secondary | ICD-10-CM | POA: Diagnosis not present

## 2019-08-15 DIAGNOSIS — G43909 Migraine, unspecified, not intractable, without status migrainosus: Secondary | ICD-10-CM | POA: Diagnosis not present

## 2019-08-15 DIAGNOSIS — J45998 Other asthma: Secondary | ICD-10-CM | POA: Insufficient documentation

## 2019-08-15 DIAGNOSIS — G4730 Sleep apnea, unspecified: Secondary | ICD-10-CM | POA: Diagnosis not present

## 2019-08-15 DIAGNOSIS — F319 Bipolar disorder, unspecified: Secondary | ICD-10-CM | POA: Insufficient documentation

## 2019-08-15 DIAGNOSIS — Z86718 Personal history of other venous thrombosis and embolism: Secondary | ICD-10-CM | POA: Diagnosis not present

## 2019-08-15 DIAGNOSIS — Z79899 Other long term (current) drug therapy: Secondary | ICD-10-CM | POA: Diagnosis not present

## 2019-08-15 DIAGNOSIS — Z86711 Personal history of pulmonary embolism: Secondary | ICD-10-CM | POA: Insufficient documentation

## 2019-08-15 DIAGNOSIS — I10 Essential (primary) hypertension: Secondary | ICD-10-CM | POA: Diagnosis not present

## 2019-08-15 DIAGNOSIS — Z885 Allergy status to narcotic agent status: Secondary | ICD-10-CM | POA: Diagnosis not present

## 2019-08-15 DIAGNOSIS — Z888 Allergy status to other drugs, medicaments and biological substances status: Secondary | ICD-10-CM | POA: Insufficient documentation

## 2019-08-15 DIAGNOSIS — D500 Iron deficiency anemia secondary to blood loss (chronic): Secondary | ICD-10-CM | POA: Diagnosis not present

## 2019-08-15 DIAGNOSIS — N938 Other specified abnormal uterine and vaginal bleeding: Secondary | ICD-10-CM | POA: Diagnosis not present

## 2019-08-15 DIAGNOSIS — Z90710 Acquired absence of both cervix and uterus: Secondary | ICD-10-CM | POA: Diagnosis present

## 2019-08-15 DIAGNOSIS — Z9981 Dependence on supplemental oxygen: Secondary | ICD-10-CM | POA: Diagnosis not present

## 2019-08-15 DIAGNOSIS — Z7901 Long term (current) use of anticoagulants: Secondary | ICD-10-CM | POA: Diagnosis not present

## 2019-08-15 HISTORY — PX: TOTAL LAPAROSCOPIC HYSTERECTOMY WITH SALPINGECTOMY: SHX6742

## 2019-08-15 HISTORY — PX: CYSTOSCOPY: SHX5120

## 2019-08-15 LAB — POCT PREGNANCY, URINE: Preg Test, Ur: NEGATIVE

## 2019-08-15 SURGERY — HYSTERECTOMY, TOTAL, LAPAROSCOPIC, WITH SALPINGECTOMY
Anesthesia: General | Laterality: Bilateral

## 2019-08-15 MED ORDER — HEPARIN SODIUM (PORCINE) 5000 UNIT/ML IJ SOLN: 5000.0000 [IU] | INTRAMUSCULAR | Status: AC

## 2019-08-15 MED ORDER — HEPARIN SODIUM (PORCINE) 5000 UNIT/ML IJ SOLN: 5000.0000 [IU] | Freq: Once | INTRAMUSCULAR | Status: DC

## 2019-08-15 MED ORDER — ONDANSETRON HCL 4 MG PO TABS
4.0000 mg | ORAL_TABLET | Freq: Four times a day (QID) | ORAL | Status: DC | PRN
  Administered 2019-08-16: 4 mg via ORAL
  Filled 2019-08-15: qty 1

## 2019-08-15 MED ORDER — ONDANSETRON HCL 4 MG/2ML IJ SOLN
INTRAMUSCULAR | Status: AC
  Filled 2019-08-15: qty 2

## 2019-08-15 MED ORDER — MENTHOL 3 MG MT LOZG
1.0000 | LOZENGE | OROMUCOSAL | Status: DC | PRN
  Filled 2019-08-15: qty 9

## 2019-08-15 MED ORDER — PHENYLEPHRINE HCL (PRESSORS) 10 MG/ML IV SOLN
INTRAVENOUS | Status: DC | PRN
  Administered 2019-08-15: 100 ug via INTRAVENOUS

## 2019-08-15 MED ORDER — BUPIVACAINE HCL (PF) 0.5 % IJ SOLN
INTRAMUSCULAR | Status: AC
  Filled 2019-08-15: qty 30

## 2019-08-15 MED ORDER — DEXTROSE-NACL 5-0.45 % IV SOLN: INTRAVENOUS | Status: DC

## 2019-08-15 MED ORDER — BUPIVACAINE HCL 0.5 % IJ SOLN
INTRAMUSCULAR | Status: DC | PRN
  Administered 2019-08-15: 10 mL

## 2019-08-15 MED ORDER — LIDOCAINE HCL (CARDIAC) PF 100 MG/5ML IV SOSY
PREFILLED_SYRINGE | INTRAVENOUS | Status: DC | PRN
  Administered 2019-08-15: 100 mg via INTRAVENOUS

## 2019-08-15 MED ORDER — PROPOFOL 10 MG/ML IV BOLUS
INTRAVENOUS | Status: AC
  Filled 2019-08-15: qty 20

## 2019-08-15 MED ORDER — SIMETHICONE 80 MG PO CHEW: 80.0000 mg | CHEWABLE_TABLET | Freq: Four times a day (QID) | ORAL | Status: DC | PRN

## 2019-08-15 MED ORDER — HEMOSTATIC AGENTS (NO CHARGE) OPTIME
TOPICAL | Status: DC | PRN
  Administered 2019-08-15: 1 via TOPICAL

## 2019-08-15 MED ORDER — ROCURONIUM BROMIDE 100 MG/10ML IV SOLN
INTRAVENOUS | Status: DC | PRN
  Administered 2019-08-15: 5 mg via INTRAVENOUS
  Administered 2019-08-15: 30 mg via INTRAVENOUS
  Administered 2019-08-15 (×2): 20 mg via INTRAVENOUS

## 2019-08-15 MED ORDER — FENTANYL CITRATE (PF) 100 MCG/2ML IJ SOLN
INTRAMUSCULAR | Status: AC
  Filled 2019-08-15: qty 2

## 2019-08-15 MED ORDER — PROPRANOLOL HCL 40 MG PO TABS
60.0000 mg | ORAL_TABLET | Freq: Every day | ORAL | Status: DC
  Administered 2019-08-16: 60 mg via ORAL
  Filled 2019-08-15 (×2): qty 1

## 2019-08-15 MED ORDER — ONDANSETRON HCL 4 MG/2ML IJ SOLN: 4.0000 mg | Freq: Once | INTRAMUSCULAR | Status: DC | PRN

## 2019-08-15 MED ORDER — LACTATED RINGERS IV SOLN
INTRAVENOUS | Status: DC
  Administered 2019-08-15: 50 mL/h via INTRAVENOUS

## 2019-08-15 MED ORDER — ONDANSETRON HCL 4 MG/2ML IJ SOLN: 4.0000 mg | Freq: Four times a day (QID) | INTRAMUSCULAR | Status: DC | PRN

## 2019-08-15 MED ORDER — AZATHIOPRINE 50 MG PO TABS
50.0000 mg | ORAL_TABLET | Freq: Every day | ORAL | Status: DC
  Administered 2019-08-16: 50 mg via ORAL
  Filled 2019-08-15 (×2): qty 1

## 2019-08-15 MED ORDER — OXYCODONE-ACETAMINOPHEN 5-325 MG PO TABS
1.0000 | ORAL_TABLET | ORAL | Status: DC | PRN
  Administered 2019-08-15 – 2019-08-16 (×4): 2 via ORAL
  Filled 2019-08-15 (×4): qty 2

## 2019-08-15 MED ORDER — FENTANYL CITRATE (PF) 100 MCG/2ML IJ SOLN
25.0000 ug | INTRAMUSCULAR | Status: DC | PRN
  Administered 2019-08-15 (×4): 25 ug via INTRAVENOUS

## 2019-08-15 MED ORDER — DEXAMETHASONE SODIUM PHOSPHATE 10 MG/ML IJ SOLN
INTRAMUSCULAR | Status: DC | PRN
  Administered 2019-08-15: 10 mg via INTRAVENOUS

## 2019-08-15 MED ORDER — PREDNISONE 5 MG PO TABS
5.0000 mg | ORAL_TABLET | Freq: Every day | ORAL | Status: DC
  Administered 2019-08-16: 5 mg via ORAL
  Filled 2019-08-15 (×2): qty 1

## 2019-08-15 MED ORDER — PROPOFOL 10 MG/ML IV BOLUS
INTRAVENOUS | Status: DC | PRN
  Administered 2019-08-15: 200 mg via INTRAVENOUS

## 2019-08-15 MED ORDER — HEPARIN SODIUM (PORCINE) 5000 UNIT/ML IJ SOLN
INTRAMUSCULAR | Status: AC
  Administered 2019-08-15: 5000 [IU] via SUBCUTANEOUS
  Filled 2019-08-15: qty 1

## 2019-08-15 MED ORDER — ROCURONIUM BROMIDE 50 MG/5ML IV SOLN
INTRAVENOUS | Status: AC
  Filled 2019-08-15: qty 3

## 2019-08-15 MED ORDER — HYDROMORPHONE HCL 1 MG/ML IJ SOLN
0.2000 mg | INTRAMUSCULAR | Status: DC | PRN
  Administered 2019-08-15 – 2019-08-16 (×7): 0.6 mg via INTRAVENOUS
  Filled 2019-08-15 (×8): qty 1

## 2019-08-15 MED ORDER — DEXAMETHASONE SODIUM PHOSPHATE 10 MG/ML IJ SOLN
INTRAMUSCULAR | Status: AC
  Filled 2019-08-15: qty 1

## 2019-08-15 MED ORDER — LIDOCAINE HCL (PF) 2 % IJ SOLN
INTRAMUSCULAR | Status: AC
  Filled 2019-08-15: qty 10

## 2019-08-15 MED ORDER — ALBUTEROL SULFATE (2.5 MG/3ML) 0.083% IN NEBU: 2.5000 mg | INHALATION_SOLUTION | Freq: Four times a day (QID) | RESPIRATORY_TRACT | Status: DC | PRN

## 2019-08-15 MED ORDER — GLYCOPYRROLATE 0.2 MG/ML IJ SOLN
INTRAMUSCULAR | Status: DC | PRN
  Administered 2019-08-15: .2 mg via INTRAVENOUS

## 2019-08-15 MED ORDER — FENTANYL CITRATE (PF) 100 MCG/2ML IJ SOLN
INTRAMUSCULAR | Status: DC | PRN
  Administered 2019-08-15 (×2): 25 ug via INTRAVENOUS
  Administered 2019-08-15 (×2): 50 ug via INTRAVENOUS

## 2019-08-15 MED ORDER — IPRATROPIUM-ALBUTEROL 0.5-2.5 (3) MG/3ML IN SOLN
3.0000 mL | Freq: Every day | RESPIRATORY_TRACT | Status: DC
  Administered 2019-08-16: 12:00:00 3 mL via RESPIRATORY_TRACT
  Filled 2019-08-15: qty 3

## 2019-08-15 MED ORDER — ROCURONIUM BROMIDE 50 MG/5ML IV SOLN
INTRAVENOUS | Status: AC
  Filled 2019-08-15: qty 1

## 2019-08-15 MED ORDER — MIDAZOLAM HCL 2 MG/2ML IJ SOLN
INTRAMUSCULAR | Status: DC | PRN
  Administered 2019-08-15: 2 mg via INTRAVENOUS

## 2019-08-15 MED ORDER — ACETAMINOPHEN 10 MG/ML IV SOLN
INTRAVENOUS | Status: DC | PRN
  Administered 2019-08-15: 1000 mg via INTRAVENOUS

## 2019-08-15 MED ORDER — ACETAMINOPHEN NICU IV SYRINGE 10 MG/ML
INTRAVENOUS | Status: AC
  Filled 2019-08-15: qty 1

## 2019-08-15 MED ORDER — SUCCINYLCHOLINE CHLORIDE 20 MG/ML IJ SOLN
INTRAMUSCULAR | Status: AC
  Filled 2019-08-15: qty 1

## 2019-08-15 MED ORDER — SUGAMMADEX SODIUM 200 MG/2ML IV SOLN
INTRAVENOUS | Status: AC
  Filled 2019-08-15: qty 4

## 2019-08-15 MED ORDER — ONDANSETRON HCL 4 MG/2ML IJ SOLN
INTRAMUSCULAR | Status: DC | PRN
  Administered 2019-08-15: 4 mg via INTRAVENOUS

## 2019-08-15 MED ORDER — SUCCINYLCHOLINE CHLORIDE 20 MG/ML IJ SOLN
INTRAMUSCULAR | Status: DC | PRN
  Administered 2019-08-15: 100 mg via INTRAVENOUS

## 2019-08-15 MED ORDER — MIDAZOLAM HCL 2 MG/2ML IJ SOLN
INTRAMUSCULAR | Status: AC
  Filled 2019-08-15: qty 2

## 2019-08-15 SURGICAL SUPPLY — 56 items
ADH SKN CLS APL DERMABOND .7 (GAUZE/BANDAGES/DRESSINGS) ×2
APL PRP STRL LF DISP 70% ISPRP (MISCELLANEOUS) ×1
APL SRG 38 LTWT LNG FL B (MISCELLANEOUS) ×2
APPLICATOR ARISTA FLEXITIP XL (MISCELLANEOUS) ×3 IMPLANT
BAG DRN RND TRDRP ANRFLXCHMBR (UROLOGICAL SUPPLIES) ×2
BAG URINE DRAIN 2000ML AR STRL (UROLOGICAL SUPPLIES) ×3 IMPLANT
BLADE SURG SZ11 CARB STEEL (BLADE) ×3 IMPLANT
CANISTER SUCT 1200ML W/VALVE (MISCELLANEOUS) ×3 IMPLANT
CATH FOLEY 2WAY  5CC 16FR (CATHETERS) ×1
CATH FOLEY 2WAY 5CC 16FR (CATHETERS) ×2
CATH URTH 16FR FL 2W BLN LF (CATHETERS) ×2 IMPLANT
CHLORAPREP W/TINT 26 (MISCELLANEOUS) ×3 IMPLANT
DEFOGGER SCOPE WARMER CLEARIFY (MISCELLANEOUS) ×3 IMPLANT
DERMABOND ADVANCED (GAUZE/BANDAGES/DRESSINGS) ×1
DERMABOND ADVANCED .7 DNX12 (GAUZE/BANDAGES/DRESSINGS) ×2 IMPLANT
DEVICE SUTURE ENDOST 10MM (ENDOMECHANICALS) ×3 IMPLANT
GLOVE BIO SURGEON STRL SZ7 (GLOVE) ×12 IMPLANT
GLOVE INDICATOR 7.5 STRL GRN (GLOVE) ×3 IMPLANT
GOWN STRL REUS W/ TWL LRG LVL3 (GOWN DISPOSABLE) ×4 IMPLANT
GOWN STRL REUS W/ TWL XL LVL3 (GOWN DISPOSABLE) ×2 IMPLANT
GOWN STRL REUS W/TWL LRG LVL3 (GOWN DISPOSABLE) ×6
GOWN STRL REUS W/TWL XL LVL3 (GOWN DISPOSABLE) ×3
GRASPER SUT TROCAR 14GX15 (MISCELLANEOUS) ×3 IMPLANT
HEMOSTAT ARISTA ABSORB 3G PWDR (HEMOSTASIS) ×3 IMPLANT
IRRIGATION STRYKERFLOW (MISCELLANEOUS) ×2 IMPLANT
IRRIGATOR STRYKERFLOW (MISCELLANEOUS) ×3
IV LACTATED RINGERS 1000ML (IV SOLUTION) ×3 IMPLANT
IV NS 1000ML (IV SOLUTION) ×3
IV NS 1000ML BAXH (IV SOLUTION) ×2 IMPLANT
KIT PINK PAD W/HEAD ARE REST (MISCELLANEOUS) ×3
KIT PINK PAD W/HEAD ARM REST (MISCELLANEOUS) ×2 IMPLANT
LABEL OR SOLS (LABEL) ×3 IMPLANT
MANIPULATOR VCARE LG CRV RETR (MISCELLANEOUS) ×3 IMPLANT
MANIPULATOR VCARE SML CRV RETR (MISCELLANEOUS) IMPLANT
MANIPULATOR VCARE STD CRV RETR (MISCELLANEOUS) IMPLANT
NS IRRIG 500ML POUR BTL (IV SOLUTION) ×3 IMPLANT
OCCLUDER COLPOPNEUMO (BALLOONS) ×3 IMPLANT
PACK GYN LAPAROSCOPIC (MISCELLANEOUS) ×3 IMPLANT
PAD OB MATERNITY 4.3X12.25 (PERSONAL CARE ITEMS) ×3 IMPLANT
PAD PREP 24X41 OB/GYN DISP (PERSONAL CARE ITEMS) ×3 IMPLANT
SCISSORS METZENBAUM CVD 33 (INSTRUMENTS) ×3 IMPLANT
SET CYSTO W/LG BORE CLAMP LF (SET/KITS/TRAYS/PACK) ×3 IMPLANT
SHEARS HARMONIC ACE PLUS 36CM (ENDOMECHANICALS) ×3 IMPLANT
SLEEVE ENDOPATH XCEL 5M (ENDOMECHANICALS) ×3 IMPLANT
STRAP SAFETY 5IN WIDE (MISCELLANEOUS) ×3 IMPLANT
SUT ENDO VLOC 180-0-8IN (SUTURE) ×6 IMPLANT
SUT MNCRL 4-0 (SUTURE) ×6
SUT MNCRL 4-0 27XMFL (SUTURE) ×4
SUT VIC AB 0 CT1 27 (SUTURE) ×3
SUT VIC AB 0 CT1 27XCR 8 STRN (SUTURE) ×2 IMPLANT
SUTURE MNCRL 4-0 27XMF (SUTURE) ×4 IMPLANT
SYR 10ML LL (SYRINGE) ×3 IMPLANT
SYR 50ML LL SCALE MARK (SYRINGE) ×3 IMPLANT
TROCAR ENDO BLADELESS 11MM (ENDOMECHANICALS) ×3 IMPLANT
TROCAR XCEL NON-BLD 5MMX100MML (ENDOMECHANICALS) ×3 IMPLANT
TUBING EVAC SMOKE HEATED PNEUM (TUBING) ×3 IMPLANT

## 2019-08-15 NOTE — Op Note (Signed)
Preoperative Diagnosis: 1) 49 y.o. 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  Operation Performed: Total laparoscopic hysterectomy, bilateral salpingectomy, and cystoscopy  Indication:  Failed endometrial abaltion  Surgeon: Malachy Mood, MD  Assistant: Barnett Applebaum, MD this surgery required a high level surgical assistant with none other readily available  Anesthesia: GETA  Preoperative Antibiotics: none  Estimated Blood Loss: 250 mL  IV Fluids: 1L  Urine Output:: 944mL  Drains or Tubes: none  Implants: none  Specimens Removed: Uterus, cervix, and bilateral fallopian tubes  Complications: none  Intraoperative Findings: Normal uterus, cervix, fallopian tubes, and ovaries  Patient Condition: stable  Procedure in Detail:  Patient was taken to the operating room where she was administered general anesthesia.  She was positioned in the dorsal lithotomy position utilizing Allen stirups, prepped and draped in the usual sterile fashion.  Prior to proceeding with procedure a time out was performed.  Attention was turned to the patient's pelvis.  An indwelling foley catheter was placed to decompress the patient's bladder.  An operative speculum was placed to allow visualization of the cervix.  The anterior lip of the cervix was grasped with a single tooth tenaculum, and a large V-care uterine manipulator was placed to allow manipulation of the uterus.  The operative speculum and single tooth tenaculum were then removed.  Attention was turned to the patient's abdomen.  The umbilicus was infiltrated with 1% Sensorcaine, before making a stab incision using an 11 blade scalpel.  A 1mm Excel trocar was then used to gain direct entry into the  peritoneal cavity utilizing the camera to visualize progress of the trocar during placement.  Once peritoneal entry had been achieved, insufflation was started and pneumoperitoneum established at a pressure of 75mmHg.    One left and one right lower quadrant site were then injected with 1% Sensorcaine and a stab incision was made using an 11 blade scalpel.  Two additional 35mm Excel trocars were placed through these incisions under direct visualization. The umbilical trocar was stepped up to an 20mm Excel trocar. General inspection of the abdomen revealed the above noted findings.   The right tube was identified and grasped at its fimbriated end.  The tube was transected from its attachments to the ovary and mesosalpinx using a 27mm Harmonic scalpel.  The utero ovarian ligament was identified ligated and transected using the Harmonic scalpel. The round ligament was then likewise ligated and transected.  The anterior leaf of the broad ligament was dissected down to the level of the internal cervical os and a bladder flap was started.  The posterior leaf of the broad ligament was dissected down to the utero-sacral ligament.  The uterine artery was skeletonized before being ligated and transected using the Harmonic scalpel with cephelad pressure applied to the V-care device to assure lateralization of the ureter.  A bite was then taken with Harmonic medial to transected portio of uterine artery to further lateralize the ureter and vessel off the V-care cup.  The patient left adnexal structures were then dissected in similar fashion.  The bladder flap was completed and the bladder mobilized off the V-care cup.  An anterior colpotomy was scores and carried around in a clockwise fashion to free the specimen, which was then removed vaginally.  Inspection revealed all pedicles  to be hemostatic before proceed with vaginal closure.  The vaginal cuff was closed using an endostitch device with a running V-loc.  No cuff defects  were appreciated after closure.  3g of arista was applied to all pedicles. A PMI and 0 Vicryl stitch was used to close the 25mm port site fascia under direct visualization.  Pneumoperitoneum was evacuated and trocars were removed.  67mm torcar site was closed using 4-0 Monocryl in a subcuticular fashion.  All trocar sites were then dressed with surgical skin glue.   The indwelling foley catheter was removed.  Cystoscopy was performed noting and intact bladder dome as well as brisk efflux of urine from bother ureteral orifices.  The cystoscopy was removed and the indwelling foley catheter was replaced.  Sponge needle and instrument counts were correct time two.  The patient tolerated the procedure well and was taken to the recovery room in stable condition.

## 2019-08-15 NOTE — Anesthesia Procedure Notes (Addendum)
Procedure Name: Intubation Date/Time: 08/15/2019 11:31 AM Performed by: Allean Found, CRNA Pre-anesthesia Checklist: Patient identified, Patient being monitored, Timeout performed, Emergency Drugs available and Suction available Patient Re-evaluated:Patient Re-evaluated prior to induction Oxygen Delivery Method: Circle system utilized Preoxygenation: Pre-oxygenation with 100% oxygen Induction Type: IV induction Ventilation: Mask ventilation without difficulty Laryngoscope Size: Mac and 3 Grade View: Grade I Tube type: Oral Tube size: 7.0 mm Number of attempts: 1 Airway Equipment and Method: Stylet Placement Confirmation: ETT inserted through vocal cords under direct vision,  positive ETCO2 and breath sounds checked- equal and bilateral Secured at: 21 cm Tube secured with: Tape Dental Injury: Teeth and Oropharynx as per pre-operative assessment  Comments: DVL and intubation performed by Simeon Craft under supervision of Larina Bras and Dr Ronelle Nigh.

## 2019-08-15 NOTE — Transfer of Care (Signed)
Immediate Anesthesia Transfer of Care Note  Patient: Lisa [name]  Procedure(s) Performed: TOTAL LAPAROSCOPIC HYSTERECTOMY WITH SALPINGECTOMY (N/A ) CYSTOSCOPY  Patient Location: PACU  Anesthesia Type:General  Level of Consciousness: awake, alert  and oriented  Airway & Oxygen Therapy: Patient Spontanous Breathing and Patient connected to face mask oxygen  Post-op Assessment: Report given to RN and Post -op Vital signs reviewed and stable  Post vital signs: Reviewed and stable  Last Vitals:  Vitals Value Taken Time  BP 121/77 08/15/19 1342  Temp 36.6 C 08/15/19 1342  Pulse 55 08/15/19 1343  Resp 12 08/15/19 1343  SpO2 100 % 08/15/19 1343  Vitals shown include unvalidated device data.  Last Pain:  Vitals:   08/15/19 0838  TempSrc: Tympanic         Complications: No apparent anesthesia complications

## 2019-08-15 NOTE — H&P (Signed)
Date of Initial H&P: 07/30/2018  History reviewed, patient examined, no change in status, stable for surgery.

## 2019-08-15 NOTE — Progress Notes (Signed)
15 minute call to floor. 

## 2019-08-15 NOTE — Anesthesia Postprocedure Evaluation (Signed)
Anesthesia Post Note  Patient: Lisa [name]  Procedure(s) Performed: TOTAL LAPAROSCOPIC HYSTERECTOMY WITH SALPINGECTOMY (Bilateral ) CYSTOSCOPY  Patient location during evaluation: PACU Anesthesia Type: General Level of consciousness: awake and alert Pain management: pain level controlled Vital Signs Assessment: post-procedure vital signs reviewed and stable Respiratory status: spontaneous breathing and respiratory function stable Cardiovascular status: stable Anesthetic complications: no     Last Vitals:  Vitals:   08/15/19 0838 08/15/19 1342  BP: 124/76 121/77  Pulse: (!) 55   Resp: 18 14  Temp: (!) 35.8 C 36.6 C  SpO2: 100% 100%    Last Pain:  Vitals:   08/15/19 0838  TempSrc: Tympanic                 Tenisha Fleece K

## 2019-08-15 NOTE — Anesthesia Preprocedure Evaluation (Signed)
Anesthesia Evaluation  Patient identified by MRN, date of birth, ID band Patient awake    Reviewed: Allergy & Precautions, NPO status , Patient's Chart, lab work & pertinent test results  History of Anesthesia Complications Negative for: history of anesthetic complications  Airway Mallampati: III       Dental   Pulmonary asthma (2L O2 at night) , sleep apnea (not using CPAP) , neg COPD,  COPD inhaler and oxygen dependent, Not current smoker,           Cardiovascular hypertension, Pt. on medications (-) Past MI and (-) CHF (-) dysrhythmias (-) Valvular Problems/Murmurs     Neuro/Psych Anxiety Depression Bipolar Disorder Schizophrenia    GI/Hepatic Neg liver ROS, GERD  Medicated and Controlled,  Endo/Other  neg diabetesMorbid obesity  Renal/GU negative Renal ROS     Musculoskeletal   Abdominal   Peds  Hematology  (+) anemia ,   Anesthesia Other Findings   Reproductive/Obstetrics                             Anesthesia Physical Anesthesia Plan  ASA: III  Anesthesia Plan: General   Post-op Pain Management:    Induction: Intravenous  PONV Risk Score and Plan: 3 and Ondansetron, Dexamethasone and Midazolam  Airway Management Planned: Oral ETT  Additional Equipment:   Intra-op Plan:   Post-operative Plan:   Informed Consent: I have reviewed the patients History and Physical, chart, labs and discussed the procedure including the risks, benefits and alternatives for the proposed anesthesia with the patient or authorized representative who has indicated his/her understanding and acceptance.       Plan Discussed with:   Anesthesia Plan Comments:         Anesthesia Quick Evaluation

## 2019-08-16 ENCOUNTER — Telehealth: Payer: Self-pay

## 2019-08-16 DIAGNOSIS — N920 Excessive and frequent menstruation with regular cycle: Secondary | ICD-10-CM | POA: Diagnosis not present

## 2019-08-16 LAB — CBC
HCT: 34.8 % — ABNORMAL LOW (ref 36.0–46.0)
Hemoglobin: 11.7 g/dL — ABNORMAL LOW (ref 12.0–15.0)
MCH: 29.4 pg (ref 26.0–34.0)
MCHC: 33.6 g/dL (ref 30.0–36.0)
MCV: 87.4 fL (ref 80.0–100.0)
Platelets: 249 10*3/uL (ref 150–400)
RBC: 3.98 MIL/uL (ref 3.87–5.11)
RDW: 13.7 % (ref 11.5–15.5)
WBC: 16.7 10*3/uL — ABNORMAL HIGH (ref 4.0–10.5)
nRBC: 0 % (ref 0.0–0.2)

## 2019-08-16 LAB — BASIC METABOLIC PANEL
Anion gap: 8 (ref 5–15)
BUN: 7 mg/dL (ref 6–20)
CO2: 26 mmol/L (ref 22–32)
Calcium: 8.2 mg/dL — ABNORMAL LOW (ref 8.9–10.3)
Chloride: 104 mmol/L (ref 98–111)
Creatinine, Ser: 0.58 mg/dL (ref 0.44–1.00)
GFR calc Af Amer: >60 mL/min (ref >60)
GFR calc non Af Amer: >60 mL/min (ref >60)
Glucose, Bld: 149 mg/dL — ABNORMAL HIGH (ref 70–99)
Potassium: 3.7 mmol/L (ref 3.5–5.1)
Sodium: 138 mmol/L (ref 135–145)

## 2019-08-16 LAB — SURGICAL PATHOLOGY

## 2019-08-16 MED ORDER — IBUPROFEN 600 MG PO TABS: 600.0000 mg | ORAL_TABLET | Freq: Four times a day (QID) | ORAL | 3 refills | Status: DC | PRN

## 2019-08-16 MED ORDER — ONDANSETRON HCL 4 MG PO TABS: 4.0000 mg | ORAL_TABLET | Freq: Four times a day (QID) | ORAL | 0 refills | Status: DC | PRN

## 2019-08-16 MED ORDER — APIXABAN 5 MG PO TABS
5.0000 mg | ORAL_TABLET | Freq: Two times a day (BID) | ORAL | Status: DC
  Administered 2019-08-16: 5 mg via ORAL
  Filled 2019-08-16 (×2): qty 1

## 2019-08-16 MED ORDER — OXYCODONE-ACETAMINOPHEN 5-325 MG PO TABS: 1.0000 | ORAL_TABLET | ORAL | 0 refills | Status: DC | PRN

## 2019-08-16 NOTE — Telephone Encounter (Signed)
Lisa [name] from [facility]; AMS rx'd oxycodone to take up to 12/day which is more than the maximum allowed.  Can they make notation or there saying the maximum is 6/d to allow them to dispense?  [phone]

## 2019-08-16 NOTE — Progress Notes (Signed)
Patient discharged home. Discharge instructions, prescriptions and follow up appointment given to and reviewed with patient. Patient verbalized understanding. Incision kit given to pt. Pt waiting on ride 

## 2019-08-16 NOTE — Progress Notes (Signed)
Pt's HR 48 when checked at 0032. Rechecked at 0046 and it was 23. Pt otherwise not symptomatic. Explained to pt reasoning of not giving dilaudid at this time. Checked back and HR has been running low her entire admission this time.   Pt agreed to trying just the percocet to control her pain and would notify nurse if it does not.   Will continue to monitor.

## 2019-08-16 NOTE — Telephone Encounter (Signed)
I took care of this. [employer]

## 2019-08-16 NOTE — Discharge Summary (Addendum)
Physician Discharge Summary  Patient ID: Lisa [name] MRN: [id number] DOB/AGE: [birth year] 47 y.o.  Admit date: 08/15/2019 Discharge date: 08/16/2019  Admission Diagnoses: Abnormal uterine bleeding  Discharge Diagnoses:  Active Problems:   S/P hysterectomy   S/P laparoscopic hysterectomy   Discharged Condition: good  Hospital Course: Patient admitted for scheduled TLH, BS, cystoscopy for AUB failing attempt at endometrial ablation and in the setting of chronic anticoagulation secondary to history of multiple DVT's.  Patient had an uncomplicated surgery, was ppx with heparing and SCD's intraoperatively.  Repeat H&H POD1 stable as well as stable BUN/Cr.  Patient will restart her eliquis today.  Patient remained hemodynamically stable and afebrile throughout admission.  Consults: None  Significant Diagnostic Studies:  Results for orders placed or performed during the hospital encounter of 08/15/19 (from the past 24 hour(s))  CBC     Status: Abnormal   Collection Time: 08/16/19  6:27 AM  Result Value Ref Range   WBC 16.7 (H) 4.0 - 10.5 K/uL   RBC 3.98 3.87 - 5.11 MIL/uL   Hemoglobin 11.7 (L) 12.0 - 15.0 g/dL   HCT 34.8 (L) 36.0 - 46.0 %   MCV 87.4 80.0 - 100.0 fL   MCH 29.4 26.0 - 34.0 pg   MCHC 33.6 30.0 - 36.0 g/dL   RDW 13.7 11.5 - 15.5 %   Platelets 249 150 - 400 K/uL   nRBC 0.0 0.0 - 0.2 %  Basic metabolic panel     Status: Abnormal   Collection Time: 08/16/19  6:27 AM  Result Value Ref Range   Sodium 138 135 - 145 mmol/L   Potassium 3.7 3.5 - 5.1 mmol/L   Chloride 104 98 - 111 mmol/L   CO2 26 22 - 32 mmol/L   Glucose, Bld 149 (H) 70 - 99 mg/dL   BUN 7 6 - 20 mg/dL   Creatinine, Ser 0.58 0.44 - 1.00 mg/dL   Calcium 8.2 (L) 8.9 - 10.3 mg/dL   GFR calc non Af Amer >60 >60 mL/min   GFR calc Af Amer >60 >60 mL/min   Anion gap 8 5 - 15     Treatments: surgery: TLH, BS, cystoscopy 08/15/2019  Discharge Exam: Blood pressure 100/61, pulse 65, temperature 98.3 F  (36.8 C), temperature source Oral, resp. rate 18, height 5\' 8"  (1.727 m), weight 124.7 kg, last menstrual period 07/17/2019, SpO2 97 %.  General: NAD, well nourished, appears stated age Pulmonary: no increased work of breathing Abdomen: soft, non-tender, non-distended, incision(s) D/C/I Extremities: no edema Neurologic: normal gait    Disposition: Discharge disposition: 01-Home or Self Care       Discharge Instructions    Call MD for:   Complete by: As directed    Heavy vaginal bleeding greater than 1 pad an hour   Call MD for:  difficulty breathing, headache or visual disturbances   Complete by: As directed    Call MD for:  extreme fatigue   Complete by: As directed    Call MD for:  hives   Complete by: As directed    Call MD for:  persistant dizziness or light-headedness   Complete by: As directed    Call MD for:  persistant nausea and vomiting   Complete by: As directed    Call MD for:  redness, tenderness, or signs of infection (pain, swelling, redness, odor or green/yellow discharge around incision site)   Complete by: As directed    Call MD for:  severe uncontrolled pain   Complete  by: As directed    Call MD for:  temperature >100.4   Complete by: As directed    Diet general   Complete by: As directed    Discharge wound care:   Complete by: As directed    You may apply a light dressing for minor discharge from the incision or to keep waistbands of clothing from rubbing.  You may also have been discharge with a clear dressing in which case this will be removed at your postoperative clinic visit.  You may shower, use soap on your incision.  Avoid baths or soaking the incision in the first 6 weeks following your surgery..   Driving restriction   Complete by: As directed    Avoid driving for at least 2 weeks or while taking prescription narcotics.   Lifting restrictions   Complete by: As directed    Weight restriction of 10lbs for 6 weeks.     Allergies as of  08/16/2019      Reactions   Lisinopril Anaphylaxis   Facial swelling, difficulty breathing   Morphine And Related Other (See Comments)   migraine      Medication List    STOP taking these medications   HYDROcodone-acetaminophen 5-325 MG tablet Commonly known as: NORCO/VICODIN     TAKE these medications   Aimovig 140 MG/ML Soaj Generic drug: Erenumab-aooe Inject 140 mg into the skin every 30 (thirty) days.   albuterol 108 (90 Base) MCG/ACT inhaler Commonly known as: VENTOLIN HFA Inhale 2 puffs into the lungs every 6 (six) hours as needed for wheezing or shortness of breath.   azaTHIOprine 50 MG tablet Commonly known as: IMURAN Take 50 mg by mouth daily.   Chromium Picolinate 200 MCG Caps Take 200 mcg by mouth daily.   Eliquis 5 MG Tabs tablet Generic drug: apixaban Take 5 mg by mouth 2 (two) times daily. What changed: Another medication with the same name was removed. Continue taking this medication, and follow the directions you see here.   ferrous sulfate 325 (65 FE) MG tablet Take 325 mg by mouth daily with breakfast.   ibuprofen 600 MG tablet Commonly known as: ADVIL Take 1 tablet (600 mg total) by mouth every 6 (six) hours as needed.   Ipratropium-Albuterol 20-100 MCG/ACT Aers respimat Commonly known as: COMBIVENT Inhale 1 puff into the lungs daily.   OIL OF OREGANO PO Take 150 mg by mouth daily.   omeprazole 40 MG capsule Commonly known as: PRILOSEC Take 40 mg by mouth daily.   ondansetron 4 MG tablet Commonly known as: ZOFRAN Take 1 tablet (4 mg total) by mouth every 6 (six) hours as needed for nausea.   oxyCODONE-acetaminophen 5-325 MG tablet Commonly known as: PERCOCET/ROXICET Take 1-2 tablets by mouth every 4 (four) hours as needed for moderate pain.   predniSONE 5 MG tablet Commonly known as: DELTASONE Take 5 mg by mouth daily.   propranolol 20 MG tablet Commonly known as: INDERAL Take 60 mg by mouth daily.   SUMAtriptan 100 MG  tablet Commonly known as: IMITREX Take 100 mg by mouth every 2 (two) hours as needed for migraine.            Discharge Care Instructions  (From admission, onward)         Start     Ordered   08/16/19 0000  Discharge wound care:    Comments: You may apply a light dressing for minor discharge from the incision or to keep waistbands of clothing from rubbing.  You may also have been discharge with a clear dressing in which case this will be removed at your postoperative clinic visit.  You may shower, use soap on your incision.  Avoid baths or soaking the incision in the first 6 weeks following your surgery.Marland Kitchen   08/16/19 [id number]         Follow-up Information    Malachy Mood, MD. Schedule an appointment as soon as possible for a visit in 1 week(s).   Specialty: Obstetrics and Gynecology Why: please call and make an appoitment for 1 week for a post op follow up appointment Contact information: [address] [place] [address] [phone]           Signed: Dalia Heading 08/16/2019, 3:04 PM

## 2019-08-16 NOTE — Progress Notes (Signed)
Have called pharmacy 3x for them to restock dilaudid in our pyxis.   Notified patient that we are waiting on them and I will bring to her as soon as it is delivered.

## 2019-08-22 ENCOUNTER — Encounter: Payer: Self-pay | Admitting: Obstetrics and Gynecology

## 2019-08-22 ENCOUNTER — Ambulatory Visit (INDEPENDENT_AMBULATORY_CARE_PROVIDER_SITE_OTHER): Payer: Medicare Other | Admitting: Obstetrics and Gynecology

## 2019-08-22 ENCOUNTER — Other Ambulatory Visit: Payer: Self-pay

## 2019-08-22 VITALS — BP 130/88 | HR 59 | Wt 276.0 lb

## 2019-08-22 DIAGNOSIS — Z48816 Encounter for surgical aftercare following surgery on the genitourinary system: Secondary | ICD-10-CM

## 2019-08-22 DIAGNOSIS — Z4889 Encounter for other specified surgical aftercare: Secondary | ICD-10-CM

## 2019-08-22 MED ORDER — OXYCODONE-ACETAMINOPHEN 5-325 MG PO TABS: 1.0000 | ORAL_TABLET | ORAL | 0 refills | Status: DC | PRN

## 2019-08-22 NOTE — Progress Notes (Signed)
Postoperative Follow-up Patient presents post op from [facility], BS, cysto 1weeks ago for abnormal uterine bleeding.  Subjective: Patient reports some improvement in her preop symptoms. Eating a regular diet without difficulty. Pain is controlled with current analgesics. Medications being used: narcotic analgesics including oxycodone/acetaminophen (Percocet, Tylox).  Activity: normal activities of daily living.  Objective: Blood pressure 130/88, pulse (!) 59, weight 276 lb (125.2 kg).  General: NAD Pulmonary: no increased work of breathing Abdomen: soft, non-tender, non-distended, incision(s) D/C/I Extremities: no edema Neurologic: normal gait    Admission on 08/15/2019, Discharged on 08/16/2019  Component Date Value Ref Range Status  . Preg Test, Ur 08/15/2019 NEGATIVE  NEGATIVE Final   Comment:        THE SENSITIVITY OF THIS METHODOLOGY IS >24 mIU/mL   . SURGICAL PATHOLOGY 08/15/2019    Final-Edited                   Value:SURGICAL PATHOLOGY CASE: [id number] PATIENT: Lisa [name] Surgical Pathology Report     Specimen Submitted: A. Uterus with cervix, bilateral tubes  Clinical History: Abnormal uterine bleeding N93.9      DIAGNOSIS: A. UTERUS AND CERVIX WITH BILATERAL FALLOPIAN TUBES; TOTAL HYSTERECTOMY WITH SALPINGECTOMY: - CERVIX: NEGATIVE FOR DYSPLASIA AND MALIGNANCY. - ENDOMETRIUM: WEAKLY PROLIFERATIVE. NEGATIVE FOR ATYPIA / EIN AND MALIGNANCY. - MYOMETRIUM AND UTERINE SEROSA: NO SIGNIFICANT PATHOLOGIC ALTERATION. - BILATERAL FALLOPIAN TUBES: NEGATIVE FOR ATYPIA AND MALIGNANCY.   GROSS DESCRIPTION: A. Labeled: Uterus with cervix, bilateral tubes Received: In formalin Weight: 142 grams Dimensions:      Fundus -6.0 x 5.0 x 5.0 cm      Cervix -4.0 x 3.0 cm Serosa: Pink, smooth, and shiny Cervix: The ectocervical mucosa is pink and glistening.  Sections demonstrate Nabothian cysts up to 0.4 cm and contain clear  gelatinous material Endocervix: The canal is                          3.5 cm in length and covered by mucoid material Endometrial cavity:      Dimensions -3.5 cm in length and 1.7 cm in transverse      Thickness -0.1 cm      Other findings - Stenosis, consistent with previous ablation Myometrium:     Thickness -2.5 cm Fallopian tubes: Bilateral intact complete fallopian tubes with fimbriated ends measuring 3.0 cm in length and 0.7 cm in diameter each. The tubes are covered by smooth serosa and demonstrate a patent pinpoint lumen.  There is a single paratubal cyst measuring 0.4 cm which contains clear fluid.   Block summary: 1-anterior cervix 2-posterior cervix 3-4-entirely submitted anterior endometrium with representative myometrium 5-6-entirely submitted posterior endometrium with representative myometrium and serosa 7-entirely submitted right fallopian tube 8-entirely submitted left fallopian tube   Final Diagnosis performed by Betsy Pries, MD.   Electronically signed 08/16/2019 11:11:06AM The electronic signature indicates                          that the named Attending Pathologist has evaluated the specimen Technical component performed at [facility], [address] [place], [place] [address] Lab: [phone] Dir: Rush Farmer, MD, MMM  Professional component performed at [facility], [facility], [address], [place], [place] [address] Lab: [phone] Dir: Dellia Nims. Rubinas, MD   . WBC 08/16/2019 16.7* 4.0 - 10.5 K/uL Final  . RBC 08/16/2019 3.98  3.87 - 5.11 MIL/uL Final  .  Hemoglobin 08/16/2019 11.7* 12.0 - 15.0 g/dL Final  . HCT 08/16/2019 34.8* 36.0 - 46.0 % Final  . MCV 08/16/2019 87.4  80.0 - 100.0 fL Final  . MCH 08/16/2019 29.4  26.0 - 34.0 pg Final  . MCHC 08/16/2019 33.6  30.0 - 36.0 g/dL Final  . RDW 08/16/2019 13.7  11.5 - 15.5 % Final  . Platelets 08/16/2019 249  150 - 400 K/uL Final  . nRBC 08/16/2019 0.0  0.0 - 0.2 % Final    Performed at [facility], [address], [place], [place] [address]  . Sodium 08/16/2019 138  135 - 145 mmol/L Final  . Potassium 08/16/2019 3.7  3.5 - 5.1 mmol/L Final  . Chloride 08/16/2019 104  98 - 111 mmol/L Final  . CO2 08/16/2019 26  22 - 32 mmol/L Final  . Glucose, Bld 08/16/2019 149* 70 - 99 mg/dL Final  . BUN 08/16/2019 7  6 - 20 mg/dL Final  . Creatinine, Ser 08/16/2019 0.58  0.44 - 1.00 mg/dL Final  . Calcium 08/16/2019 8.2* 8.9 - 10.3 mg/dL Final  . GFR calc non Af Amer 08/16/2019 >60  >60 mL/min Final  . GFR calc Af Amer 08/16/2019 >60  >60 mL/min Final  . Anion gap 08/16/2019 8  5 - 15 Final   Performed at [facility], [address] [place], [place] [address]    Assessment: 49 y.o. s/p TLH, BS, cystoscopy stable  Plan: Patient has done well after surgery with no apparent complications.  I have discussed the post-operative course to date, and the expected progress moving forward.  The patient understands what complications to be concerned about.  I will see the patient in routine follow up, or sooner if needed.    Activity plan: No heavy lifting, nothing per vagina  Refill percocet sent  Return in about 5 weeks (around 09/26/2019) for 6 week postop.    Malachy Mood, MD, [facility], [facility] 08/22/2019, 10:25 AM

## 2019-08-31 ENCOUNTER — Telehealth: Payer: Self-pay

## 2019-08-31 ENCOUNTER — Encounter: Payer: Self-pay | Admitting: Obstetrics and Gynecology

## 2019-08-31 ENCOUNTER — Other Ambulatory Visit: Payer: Self-pay

## 2019-08-31 ENCOUNTER — Ambulatory Visit (INDEPENDENT_AMBULATORY_CARE_PROVIDER_SITE_OTHER): Payer: Medicare Other | Admitting: Obstetrics and Gynecology

## 2019-08-31 VITALS — BP 139/86 | Wt 279.0 lb

## 2019-08-31 DIAGNOSIS — Z4889 Encounter for other specified surgical aftercare: Secondary | ICD-10-CM

## 2019-08-31 NOTE — Telephone Encounter (Signed)
Appt confirmed

## 2019-08-31 NOTE — Telephone Encounter (Signed)
I'd like to see her to take a look at the cuff

## 2019-08-31 NOTE — Telephone Encounter (Signed)
Patient scheduled this afternoon, in [facility] with AMS, msg left to confirm appt.

## 2019-08-31 NOTE — Progress Notes (Signed)
Postoperative Follow-up Patient presents post op from [place], BS, cysto 3weeks ago for abnormal uterine bleeding.  Subjective: Patient reports marked improvement in her preop symptoms. Eating a regular diet without difficulty. The patient is not having any pain.  Activity: normal activities of daily living.  She has had an episode of vaginal bleeding with passage of small dime size clots.  Bleeding described at bright red.    Objective: Blood pressure 139/86, weight 279 lb (126.6 kg).  General: NAD Pulmonary: no increased work of breathing Abdomen: soft, non-tender, non-distended, incision(s) D/C/I GU: normal external female genitalia vaginal cuff intact, well healed, suture line palpable without defects noted.  Small amount of pink blood noted at vaginal vault Extremities: no edema Neurologic: normal gait    Admission on 08/15/2019, Discharged on 08/16/2019  Component Date Value Ref Range Status  . Preg Test, Ur 08/15/2019 NEGATIVE  NEGATIVE Final   Comment:        THE SENSITIVITY OF THIS METHODOLOGY IS >24 mIU/mL   . SURGICAL PATHOLOGY 08/15/2019    Final-Edited                   Value:SURGICAL PATHOLOGY CASE: [id number] PATIENT: Lisa [name] Surgical Pathology Report     Specimen Submitted: A. Uterus with cervix, bilateral tubes  Clinical History: Abnormal uterine bleeding N93.9      DIAGNOSIS: A. UTERUS AND CERVIX WITH BILATERAL FALLOPIAN TUBES; TOTAL HYSTERECTOMY WITH SALPINGECTOMY: - CERVIX: NEGATIVE FOR DYSPLASIA AND MALIGNANCY. - ENDOMETRIUM: WEAKLY PROLIFERATIVE. NEGATIVE FOR ATYPIA / EIN AND MALIGNANCY. - MYOMETRIUM AND UTERINE SEROSA: NO SIGNIFICANT PATHOLOGIC ALTERATION. - BILATERAL FALLOPIAN TUBES: NEGATIVE FOR ATYPIA AND MALIGNANCY.   GROSS DESCRIPTION: A. Labeled: Uterus with cervix, bilateral tubes Received: In formalin Weight: 142 grams Dimensions:      Fundus -6.0 x 5.0 x 5.0 cm      Cervix -4.0 x 3.0 cm Serosa: Pink,  smooth, and shiny Cervix: The ectocervical mucosa is pink and glistening.  Sections demonstrate Nabothian cysts up to 0.4 cm and contain clear gelatinous material Endocervix: The canal is                          3.5 cm in length and covered by mucoid material Endometrial cavity:      Dimensions -3.5 cm in length and 1.7 cm in transverse      Thickness -0.1 cm      Other findings - Stenosis, consistent with previous ablation Myometrium:     Thickness -2.5 cm Fallopian tubes: Bilateral intact complete fallopian tubes with fimbriated ends measuring 3.0 cm in length and 0.7 cm in diameter each. The tubes are covered by smooth serosa and demonstrate a patent pinpoint lumen.  There is a single paratubal cyst measuring 0.4 cm which contains clear fluid.   Block summary: 1-anterior cervix 2-posterior cervix 3-4-entirely submitted anterior endometrium with representative myometrium 5-6-entirely submitted posterior endometrium with representative myometrium and serosa 7-entirely submitted right fallopian tube 8-entirely submitted left fallopian tube   Final Diagnosis performed by Betsy Pries, MD.   Electronically signed 08/16/2019 11:11:06AM The electronic signature indicates                          that the named Attending Pathologist has evaluated the specimen Technical component performed at [facility], [address] [place],  [address] Lab: [phone] Dir: Rush Farmer, MD, MMM  Professional component performed at [facility], [facility]  Center, [place], [place], [place] [address] Lab: [phone] Dir: Dellia Nims. Rubinas, MD   . WBC 08/16/2019 16.7* 4.0 - 10.5 K/uL Final  . RBC 08/16/2019 3.98  3.87 - 5.11 MIL/uL Final  . Hemoglobin 08/16/2019 11.7* 12.0 - 15.0 g/dL Final  . HCT 08/16/2019 34.8* 36.0 - 46.0 % Final  . MCV 08/16/2019 87.4  80.0 - 100.0 fL Final  . MCH 08/16/2019 29.4  26.0 - 34.0 pg Final  . MCHC 08/16/2019 33.6  30.0 - 36.0 g/dL Final   . RDW 08/16/2019 13.7  11.5 - 15.5 % Final  . Platelets 08/16/2019 249  150 - 400 K/uL Final  . nRBC 08/16/2019 0.0  0.0 - 0.2 % Final   Performed at [facility], [address], [place], [place] [address]  . Sodium 08/16/2019 138  135 - 145 mmol/L Final  . Potassium 08/16/2019 3.7  3.5 - 5.1 mmol/L Final  . Chloride 08/16/2019 104  98 - 111 mmol/L Final  . CO2 08/16/2019 26  22 - 32 mmol/L Final  . Glucose, Bld 08/16/2019 149* 70 - 99 mg/dL Final  . BUN 08/16/2019 7  6 - 20 mg/dL Final  . Creatinine, Ser 08/16/2019 0.58  0.44 - 1.00 mg/dL Final  . Calcium 08/16/2019 8.2* 8.9 - 10.3 mg/dL Final  . GFR calc non Af Amer 08/16/2019 >60  >60 mL/min Final  . GFR calc Af Amer 08/16/2019 >60  >60 mL/min Final  . Anion gap 08/16/2019 8  5 - 15 Final   Performed at [facility], [address], [place], [place] [address]    Assessment: 49 y.o. s/p TLH, BS, cystoscopy stable  Plan: Patient has done well after surgery with no apparent complications.  I have discussed the post-operative course to date, and the expected progress moving forward.  The patient understands what complications to be concerned about.  I will see the patient in routine follow up, or sooner if needed.    Activity plan: no heavy lifting, nothing per vagina  Incision intact both visually and by palpation today.  Follow up in 1 week.  If bleeding pick back up consider hold anticoagulation and obtaining CT pelvic to further evalute cuff integrity.   I suspect some granulation tissue mucosal edge bleeding as the source.  There is no evidence of infection or foul smelling discharge.  A vaginal cuff hematoma that drained is also in the differential but given that it was bright red bleeding seems less [place], MD, [place], [facility] 08/31/2019, 2:29 PM

## 2019-08-31 NOTE — Telephone Encounter (Signed)
Please schedule her! Thank you

## 2019-08-31 NOTE — Telephone Encounter (Signed)
Pt called today stating that she is having some bright red bleeding, dime size clots and her pain is getting worse since yesterday. After hours nurse told her to call AMS in the morning and see what he would prefer to do. Please advise, Thank you

## 2019-09-08 ENCOUNTER — Encounter: Payer: Self-pay | Admitting: Obstetrics and Gynecology

## 2019-09-08 ENCOUNTER — Other Ambulatory Visit: Payer: Self-pay

## 2019-09-08 ENCOUNTER — Ambulatory Visit (INDEPENDENT_AMBULATORY_CARE_PROVIDER_SITE_OTHER): Payer: Medicare Other | Admitting: Obstetrics and Gynecology

## 2019-09-08 VITALS — BP 130/82 | Wt 275.0 lb

## 2019-09-08 DIAGNOSIS — Z4889 Encounter for other specified surgical aftercare: Secondary | ICD-10-CM

## 2019-09-08 NOTE — Progress Notes (Signed)
Postoperative Follow-up Patient presents post op from [place], BS, cystoscopy 3weeks ago for abnormal uterine bleeding.  Subjective: Patient reports marked improvement in her preop symptoms. Eating a regular diet without difficulty. The patient is not having any pain.  Activity: normal activities of daily living.  Objective: Blood pressure 130/82, weight 275 lb (124.7 kg).  Admission on 08/15/2019, Discharged on 08/16/2019  Component Date Value Ref Range Status  . Preg Test, Ur 08/15/2019 NEGATIVE  NEGATIVE Final   Comment:        THE SENSITIVITY OF THIS METHODOLOGY IS >24 mIU/mL   . SURGICAL PATHOLOGY 08/15/2019    Final-Edited                   Value:SURGICAL PATHOLOGY CASE: [id number] PATIENT: Lisa [name] Surgical Pathology Report     Specimen Submitted: A. Uterus with cervix, bilateral tubes  Clinical History: Abnormal uterine bleeding N93.9      DIAGNOSIS: A. UTERUS AND CERVIX WITH BILATERAL FALLOPIAN TUBES; TOTAL HYSTERECTOMY WITH SALPINGECTOMY: - CERVIX: NEGATIVE FOR DYSPLASIA AND MALIGNANCY. - ENDOMETRIUM: WEAKLY PROLIFERATIVE. NEGATIVE FOR ATYPIA / EIN AND MALIGNANCY. - MYOMETRIUM AND UTERINE SEROSA: NO SIGNIFICANT PATHOLOGIC ALTERATION. - BILATERAL FALLOPIAN TUBES: NEGATIVE FOR ATYPIA AND MALIGNANCY.   GROSS DESCRIPTION: A. Labeled: Uterus with cervix, bilateral tubes Received: In formalin Weight: 142 grams Dimensions:      Fundus -6.0 x 5.0 x 5.0 cm      Cervix -4.0 x 3.0 cm Serosa: Pink, smooth, and shiny Cervix: The ectocervical mucosa is pink and glistening.  Sections demonstrate Nabothian cysts up to 0.4 cm and contain clear gelatinous material Endocervix: The canal is                          3.5 cm in length and covered by mucoid material Endometrial cavity:      Dimensions -3.5 cm in length and 1.7 cm in transverse      Thickness -0.1 cm      Other findings - Stenosis, consistent with previous ablation Myometrium:  Thickness -2.5 cm Fallopian tubes: Bilateral intact complete fallopian tubes with fimbriated ends measuring 3.0 cm in length and 0.7 cm in diameter each. The tubes are covered by smooth serosa and demonstrate a patent pinpoint lumen.  There is a single paratubal cyst measuring 0.4 cm which contains clear fluid.   Block summary: 1-anterior cervix 2-posterior cervix 3-4-entirely submitted anterior endometrium with representative myometrium 5-6-entirely submitted posterior endometrium with representative myometrium and serosa 7-entirely submitted right fallopian tube 8-entirely submitted left fallopian tube   Final Diagnosis performed by Betsy Pries, MD.   Electronically signed 08/16/2019 11:11:06AM The electronic signature indicates                          that the named Attending Pathologist has evaluated the specimen Technical component performed at [facility], [address] [place], [place] [address] Lab: [phone] Dir: Rush Farmer, MD, MMM  Professional component performed at [facility], [facility], [place], [place], [place] [address] Lab: [phone] Dir: Dellia Nims. Rubinas, MD   . WBC 08/16/2019 16.7* 4.0 - 10.5 K/uL Final  . RBC 08/16/2019 3.98  3.87 - 5.11 MIL/uL Final  . Hemoglobin 08/16/2019 11.7* 12.0 - 15.0 g/dL Final  . HCT 08/16/2019 34.8* 36.0 - 46.0 % Final  . MCV 08/16/2019 87.4  80.0 - 100.0 fL Final  . MCH 08/16/2019 29.4  26.0 - 34.0  pg Final  . MCHC 08/16/2019 33.6  30.0 - 36.0 g/dL Final  . RDW 08/16/2019 13.7  11.5 - 15.5 % Final  . Platelets 08/16/2019 249  150 - 400 K/uL Final  . nRBC 08/16/2019 0.0  0.0 - 0.2 % Final   Performed at [facility], [address], [place], [place] [address]  . Sodium 08/16/2019 138  135 - 145 mmol/L Final  . Potassium 08/16/2019 3.7  3.5 - 5.1 mmol/L Final  . Chloride 08/16/2019 104  98 - 111 mmol/L Final  . CO2 08/16/2019 26  22 - 32 mmol/L Final  . Glucose, Bld 08/16/2019 149* 70  - 99 mg/dL Final  . BUN 08/16/2019 7  6 - 20 mg/dL Final  . Creatinine, Ser 08/16/2019 0.58  0.44 - 1.00 mg/dL Final  . Calcium 08/16/2019 8.2* 8.9 - 10.3 mg/dL Final  . GFR calc non Af Amer 08/16/2019 >60  >60 mL/min Final  . GFR calc Af Amer 08/16/2019 >60  >60 mL/min Final  . Anion gap 08/16/2019 8  5 - 15 Final   Performed at [facility], [address] [place], [place] [address]    Assessment: 49 y.o. s/p TLH, BS, cystoscopy stable  Plan: Patient has done well after surgery with no apparent complications.  I have discussed the post-operative course to date, and the expected progress moving forward.  The patient understands what complications to be concerned about.  I will see the patient in routine follow up, or sooner if needed.    Activity plan; no heavy lifting, no intercourse, no tampons  No further episodes of bleeidng   Malachy Mood, MD, [facility] 09/08/2019, 11:53 AM

## 2019-09-26 ENCOUNTER — Ambulatory Visit (INDEPENDENT_AMBULATORY_CARE_PROVIDER_SITE_OTHER): Payer: Medicare Other | Admitting: Obstetrics and Gynecology

## 2019-09-26 ENCOUNTER — Other Ambulatory Visit: Payer: Self-pay

## 2019-09-26 ENCOUNTER — Encounter: Payer: Self-pay | Admitting: Obstetrics and Gynecology

## 2019-09-26 VITALS — BP 134/88 | Wt 278.0 lb

## 2019-09-26 DIAGNOSIS — Z4889 Encounter for other specified surgical aftercare: Secondary | ICD-10-CM

## 2019-09-26 DIAGNOSIS — N938 Other specified abnormal uterine and vaginal bleeding: Secondary | ICD-10-CM

## 2019-09-26 DIAGNOSIS — N920 Excessive and frequent menstruation with regular cycle: Secondary | ICD-10-CM

## 2019-09-26 DIAGNOSIS — D500 Iron deficiency anemia secondary to blood loss (chronic): Secondary | ICD-10-CM

## 2019-09-26 NOTE — Progress Notes (Signed)
Postoperative Follow-up Patient presents post op from [place], BS, cystoscopy 6weeks ago for abnormal uterine bleeding.  Subjective: Patient reports marked improvement in her preop symptoms. Eating a regular diet without difficulty. The patient is not having any pain.  Activity: normal activities of daily living. No vaginal bleeding  Objective: Blood pressure 134/88, weight 278 lb (126.1 kg).  General: NAD Pulmonary: no increased work of breathing Abdomen: soft, non-tender, non-distended, incision(s) D/C/I GU: normal external female genitalia vaginal cuff intact, well healed.  Suture line is visible.  There is some pink granulation tissue noted in the right aspect of the cuff.   Extremities: no edema Neurologic: normal gait    Admission on 08/15/2019, Discharged on 08/16/2019  Component Date Value Ref Range Status  . Preg Test, Ur 08/15/2019 NEGATIVE  NEGATIVE Final   Comment:        THE SENSITIVITY OF THIS METHODOLOGY IS >24 mIU/mL   . SURGICAL PATHOLOGY 08/15/2019    Final-Edited                   Value:SURGICAL PATHOLOGY CASE: [id number] PATIENT: Lisa [name] Surgical Pathology Report     Specimen Submitted: A. Uterus with cervix, bilateral tubes  Clinical History: Abnormal uterine bleeding N93.9      DIAGNOSIS: A. UTERUS AND CERVIX WITH BILATERAL FALLOPIAN TUBES; TOTAL HYSTERECTOMY WITH SALPINGECTOMY: - CERVIX: NEGATIVE FOR DYSPLASIA AND MALIGNANCY. - ENDOMETRIUM: WEAKLY PROLIFERATIVE. NEGATIVE FOR ATYPIA / EIN AND MALIGNANCY. - MYOMETRIUM AND UTERINE SEROSA: NO SIGNIFICANT PATHOLOGIC ALTERATION. - BILATERAL FALLOPIAN TUBES: NEGATIVE FOR ATYPIA AND MALIGNANCY.   GROSS DESCRIPTION: A. Labeled: Uterus with cervix, bilateral tubes Received: In formalin Weight: 142 grams Dimensions:      Fundus -6.0 x 5.0 x 5.0 cm      Cervix -4.0 x 3.0 cm Serosa: Pink, smooth, and shiny Cervix: The ectocervical mucosa is pink and glistening.   Sections demonstrate Nabothian cysts up to 0.4 cm and contain clear gelatinous material Endocervix: The canal is                          3.5 cm in length and covered by mucoid material Endometrial cavity:      Dimensions -3.5 cm in length and 1.7 cm in transverse      Thickness -0.1 cm      Other findings - Stenosis, consistent with previous ablation Myometrium:     Thickness -2.5 cm Fallopian tubes: Bilateral intact complete fallopian tubes with fimbriated ends measuring 3.0 cm in length and 0.7 cm in diameter each. The tubes are covered by smooth serosa and demonstrate a patent pinpoint lumen.  There is a single paratubal cyst measuring 0.4 cm which contains clear fluid.   Block summary: 1-anterior cervix 2-posterior cervix 3-4-entirely submitted anterior endometrium with representative myometrium 5-6-entirely submitted posterior endometrium with representative myometrium and serosa 7-entirely submitted right fallopian tube 8-entirely submitted left fallopian tube   Final Diagnosis performed by Betsy Pries, MD.   Electronically signed 08/16/2019 11:11:06AM The electronic signature indicates                          that the named Attending Pathologist has evaluated the specimen Technical component performed at [facility], [address] [place], [place] [address] Lab: [phone] Dir: Rush Farmer, MD, MMM  Professional component performed at [facility], [facility], [place], [place], [place] [address] Lab: [phone] Dir: Dellia Nims. Reuel Derby, MD   .  WBC 08/16/2019 16.7* 4.0 - 10.5 K/uL Final  . RBC 08/16/2019 3.98  3.87 - 5.11 MIL/uL Final  . Hemoglobin 08/16/2019 11.7* 12.0 - 15.0 g/dL Final  . HCT 08/16/2019 34.8* 36.0 - 46.0 % Final  . MCV 08/16/2019 87.4  80.0 - 100.0 fL Final  . MCH 08/16/2019 29.4  26.0 - 34.0 pg Final  . MCHC 08/16/2019 33.6  30.0 - 36.0 g/dL Final  . RDW 08/16/2019 13.7  11.5 - 15.5 % Final  . Platelets 08/16/2019 249   150 - 400 K/uL Final  . nRBC 08/16/2019 0.0  0.0 - 0.2 % Final   Performed at [facility], [address], [place], [place] [address]  . Sodium 08/16/2019 138  135 - 145 mmol/L Final  . Potassium 08/16/2019 3.7  3.5 - 5.1 mmol/L Final  . Chloride 08/16/2019 104  98 - 111 mmol/L Final  . CO2 08/16/2019 26  22 - 32 mmol/L Final  . Glucose, Bld 08/16/2019 149* 70 - 99 mg/dL Final  . BUN 08/16/2019 7  6 - 20 mg/dL Final  . Creatinine, Ser 08/16/2019 0.58  0.44 - 1.00 mg/dL Final  . Calcium 08/16/2019 8.2* 8.9 - 10.3 mg/dL Final  . GFR calc non Af Amer 08/16/2019 >60  >60 mL/min Final  . GFR calc Af Amer 08/16/2019 >60  >60 mL/min Final  . Anion gap 08/16/2019 8  5 - 15 Final   Performed at [facility], [address], [place], [place] [address]    Assessment: 49 y.o. s/p TLH, BS, cystoscopy stable  Plan: Patient has done well after surgery with no apparent complications.  I have discussed the post-operative course to date, and the expected progress moving forward.  The patient understands what complications to be concerned about.  I will see the patient in routine follow up, or sooner if needed.    Activity plan: No restriction.   Malachy Mood, MD, [facility], [facility] 09/26/2019, 9:41 AM

## 2019-10-05 ENCOUNTER — Ambulatory Visit: Payer: Medicare Other | Admitting: Dermatology

## 2019-10-05 ENCOUNTER — Ambulatory Visit: Payer: Self-pay | Admitting: Dermatology

## 2019-10-13 ENCOUNTER — Other Ambulatory Visit: Payer: Self-pay

## 2019-10-13 ENCOUNTER — Ambulatory Visit (INDEPENDENT_AMBULATORY_CARE_PROVIDER_SITE_OTHER): Payer: Medicare Other | Admitting: Dermatology

## 2019-10-13 DIAGNOSIS — L918 Other hypertrophic disorders of the skin: Secondary | ICD-10-CM

## 2019-10-13 DIAGNOSIS — D485 Neoplasm of uncertain behavior of skin: Secondary | ICD-10-CM | POA: Diagnosis not present

## 2019-10-13 DIAGNOSIS — D23122 Other benign neoplasm of skin of left lower eyelid, including canthus: Secondary | ICD-10-CM

## 2019-10-13 DIAGNOSIS — D2310 Other benign neoplasm of skin of unspecified eyelid, including canthus: Secondary | ICD-10-CM

## 2019-10-13 DIAGNOSIS — D239 Other benign neoplasm of skin, unspecified: Secondary | ICD-10-CM

## 2019-10-13 NOTE — Progress Notes (Signed)
Follow-Up Visit   Subjective  Lisa [name] is a 49 y.o. female who presents for the following: Skin Tag (Neck, R side. Here today for removal.) and Spots (L eye, R eye, L nose. Here today for removal. Symptomatic for patient. ).  The following portions of the chart were reviewed this encounter and updated as appropriate: Tobacco  Allergies  Meds  Problems  Med Hx  Surg Hx  Fam Hx      Review of Systems: No other skin or systemic complaints.  Objective  Well appearing patient in no apparent distress; mood and affect are within normal limits.  A focused examination was performed including face, axillae, chest, neck. Relevant physical exam findings are noted in the Assessment and Plan.  Objective  Left Lower Eyelid , Right Lateral Canthus x 2 (2): Translucent papules  Images      Objective  Left Lateral Canthus (2): 0.6cm translucent papule     Objective  Left Lower Eyelid : 0.2cm firm skin colored papuled     Objective  Right chest superior: 0.4cm hyperpigmented fleshy papule     Objective  Right chest inferior: 0.4cm hyperpigmented fleshy papule     Assessment & Plan  Hidrocystoma (3) Left Lower Eyelid ; Right Lateral Canthus x 2 (2)  Symptomatic and irritated  Prior to procedure, discussed risks scar formation, hyperpigmentation, small wound, recurrence, and infection following treatment   Destruction of lesion - Left Lower Eyelid , Right Lateral Canthus x 2  Destruction method: electrodesiccation and curettage   Destruction method comment:  Incision, snip removal of sac, and electrocautery (ELECTRODESICCATION AND CURETTAGE NOT PERFORMED- THIS IS AN ERROR WITH THE EMR AND IT WILL NOT ALLOW IT TO NOT BE SELECTED) Informed consent: discussed and consent obtained   Patient was prepped and draped in usual sterile fashion: area prepped with isopropyl alcohol. Anesthesia: the lesion was anesthetized in a standard fashion     Anesthetic:  1% lidocaine w/ epinephrine 1-100,000 local infiltration Hemostasis achieved with:  electrodesiccation Outcome: patient tolerated procedure well with no complications   Post-procedure details: wound care instructions given   Additional details:  Cyst pierced with an 11 blade and sac removed. Gentle electrocautery performed to base to reduce risk of recurrence. Vaseline applied to wounds.  Neoplasm of uncertain behavior of skin (5) Left Lateral Canthus (2)  Skin / nail biopsy Type of biopsy: punch   Informed consent: discussed and consent obtained   Timeout: patient name, date of birth, surgical site, and procedure verified   Patient was prepped and draped in usual sterile fashion: Area prepped with isopropyl alcohol. Anesthesia: the lesion was anesthetized in a standard fashion   Anesthetic:  1% lidocaine w/ epinephrine 1-100,000 buffered w/ 8.4% NaHCO3 Punch size:  2 mm Suture size:  6-0 Suture type: nylon   Hemostasis achieved with: suture and electrodesiccation   Outcome: patient tolerated procedure well   Post-procedure details: wound care instructions given   Additional details:  Mupirocin applied  Specimen 2 - Surgical pathology Differential Diagnosis: r/o Hidrocystoma Check Margins: No 0.6cm translucent papule  Specimen 1 was created in error, not able to delete and does not exist.  Left Lower Eyelid   Skin / nail biopsy Type of biopsy: punch   Informed consent: discussed and consent obtained   Timeout: patient name, date of birth, surgical site, and procedure verified   Patient was prepped and draped in usual sterile fashion: Area prepped with isopropyl alcohol. Anesthesia: the lesion was anesthetized in  a standard fashion   Anesthetic:  1% lidocaine w/ epinephrine 1-100,000 buffered w/ 8.4% NaHCO3 Punch size:  2 mm Suture size:  6-0 Hemostasis achieved with: suture and electrodesiccation   Outcome: patient tolerated procedure well   Post-procedure  details: wound care instructions given   Additional details:  Mupirocin applied  Specimen 3 - Surgical pathology Differential Diagnosis: r/o Scar vs Recurrent Hidrocystoma Check Margins: No 0.2cm firm skin colored papuled  Right chest superior  Epidermal / dermal shaving  Lesion length (cm):  0.4 Lesion width (cm):  0.4 Margin per side (cm):  0.1 Total excision diameter (cm):  0.6 Informed consent: discussed and consent obtained   Timeout: patient name, date of birth, surgical site, and procedure verified   Patient was prepped and draped in usual sterile fashion: area prepped with isopropyl alcohol. Anesthesia: the lesion was anesthetized in a standard fashion   Anesthetic:  1% lidocaine w/ epinephrine 1-100,000 buffered w/ 8.4% NaHCO3 Instrument used: flexible razor blade   Hemostasis achieved with: aluminum chloride   Outcome: patient tolerated procedure well   Post-procedure details: wound care instructions given   Additional details:  Mupirocin and a bandage applied  Specimen 4 - Surgical pathology Differential Diagnosis: r/o Irritated Acrochordon vs Nevus vs Other Check Margins: No 0.4cm hyperpigmented fleshy papule  Right chest inferior  Epidermal / dermal shaving  Lesion length (cm):  0.4 Lesion width (cm):  0.4 Margin per side (cm):  0.1 Total excision diameter (cm):  0.6 Informed consent: discussed and consent obtained   Timeout: patient name, date of birth, surgical site, and procedure verified   Patient was prepped and draped in usual sterile fashion: area prepped with isopropyl alcohol. Anesthesia: the lesion was anesthetized in a standard fashion   Anesthetic:  1% lidocaine w/ epinephrine 1-100,000 buffered w/ 8.4% NaHCO3 Instrument used: flexible razor blade   Hemostasis achieved with: aluminum chloride   Outcome: patient tolerated procedure well   Post-procedure details: wound care instructions given   Additional details:  Mupirocin and a bandage  applied  Specimen 5 - Surgical pathology Differential Diagnosis: r/o Irritated Acrochordon vs Nevus vs Other Check Margins: No 0.4cm hyperpigmented fleshy papule Acrochordons (Skin Tags) - Removal desired by patient - Fleshy, skin-colored pedunculated papules - Benign appearing.  - Patient desires removal. Reviewed that this is not covered by insurance and they will be charged a cosmetic fee for removal. Patient signed non-covered consent.  - Prior to the procedure, reviewed the expected small wound. Also reviewed the risk of leaving a small scar and the small risk of infection.  PROCEDURE - The areas were prepped with isopropyl alcohol. A small amount of lidocaine 1% with epinephrine was injected at the base of each lesion to achieve good local anesthesia. The skin tags were removed using a snip technique. Aluminum chloride was used for hemostasis. Petrolatum and a bandage were applied. The procedure was tolerated well. - Wound care was reviewed with the patient. They were advised to call with any concerns. Total number of treated acrochordons 11 (L neck x 4, L axilla x 1, L chest x 2, R neck x 4)  Return in about 1 week (around 10/20/2019) for follow up/suture removal.   Graciella Belton, RMA, am acting as scribe for Forest Gleason, MD .  Documentation: I have reviewed the above documentation for accuracy and completeness, and I agree with the above.  Forest Gleason, MD

## 2019-10-13 NOTE — Patient Instructions (Signed)

## 2019-10-17 NOTE — Progress Notes (Signed)
1. Skin , left lateral canthus HIDROCYSTOMA --> benign cyst, no treatment needed unless it comes back  2. Skin , left lower eyelid HIDROCYSTOMA --> benign cyst, no treatment needed unless it comes back  3. Skin , right chest superior ACROCHORDON --> skin tag, benign, no additional treatment needed  4. Skin , right chest inferior FOLLICULAR CYST, INFUNDIBULAR TYPE (EPIDERMOID CYST) --> benign cyst, no treatment needed unless it comes back  Please call our office at [phone] or send [place] a message by MyChart if you have any concerns

## 2019-10-20 ENCOUNTER — Other Ambulatory Visit: Payer: Self-pay

## 2019-10-20 ENCOUNTER — Ambulatory Visit (INDEPENDENT_AMBULATORY_CARE_PROVIDER_SITE_OTHER): Payer: Medicare Other | Admitting: Dermatology

## 2019-10-20 DIAGNOSIS — D2310 Other benign neoplasm of skin of unspecified eyelid, including canthus: Secondary | ICD-10-CM

## 2019-10-20 DIAGNOSIS — D23122 Other benign neoplasm of skin of left lower eyelid, including canthus: Secondary | ICD-10-CM

## 2019-10-20 DIAGNOSIS — Z4802 Encounter for removal of sutures: Secondary | ICD-10-CM

## 2019-10-20 MED ORDER — BACITRACIN-POLYMYXIN B 500-10000 UNIT/GM OP OINT: TOPICAL_OINTMENT | OPHTHALMIC | 0 refills | Status: DC

## 2019-10-20 NOTE — Progress Notes (Signed)
   Follow-Up Visit   Subjective  Lisa [name] is a 49 y.o. female who presents for the following: suture removal (patient is here today for suture removal. She has experienced some drainage at the L lat canthus site.).  The following portions of the chart were reviewed this encounter and updated as appropriate: Tobacco  Allergies  Meds  Problems  Med Hx  Surg Hx  Fam Hx      Review of Systems: No other skin or systemic complaints.  Objective  Well appearing patient in no apparent distress; mood and affect are within normal limits.  A focused examination was performed including the face. Relevant physical exam findings are noted in the Assessment and Plan.  Objective  L lat canthus, L lower eyelid: Healing biopsy sites   Assessment & Plan  Hidrocystoma of eyelid, left L lat canthus, L lower eyelid  Biopsy results showing hidrocystomas reviewed.  Patient presents for suture removal. The wound is well healed without signs of infection.  The sutures are removed. Wound care and activity instructions given. Return prn.  Discussed surgical excision should sites return and become bothersome.   bacitracin-polymyxin b (POLYSPORIN) ophthalmic ointment - L lat canthus, L lower eyelid  Return if symptoms worsen or fail to improve.   Luther Redo, CMA scribed for Alfonso Patten, MD.  Documentation: I have reviewed the above documentation for accuracy and completeness, and I agree with the above.  Forest Gleason, MD

## 2019-10-20 NOTE — Patient Instructions (Signed)
After Suture Removal ° °1. After sutures are removed, the wound should be coated with an antibiotic ointment (eg. Polysporin, Bacitracin) and, if possible, kept covered with a Band-Aid or bandage for an additional 24 hours.  After that, no additional wound care is generally needed.  °2. It is alright to get the area wet. °3. If a skin cancer was removed, your skin should be re-examined in approximately three months. ° ° ° °

## 2019-10-24 ENCOUNTER — Encounter: Payer: Self-pay | Admitting: Dermatology

## 2019-10-26 ENCOUNTER — Encounter: Payer: Self-pay | Admitting: Dermatology

## 2019-11-07 ENCOUNTER — Other Ambulatory Visit: Payer: Self-pay | Admitting: Internal Medicine

## 2019-12-06 ENCOUNTER — Other Ambulatory Visit: Payer: Self-pay

## 2019-12-06 ENCOUNTER — Encounter: Payer: Self-pay | Admitting: Physician Assistant

## 2019-12-06 ENCOUNTER — Ambulatory Visit: Payer: Self-pay | Admitting: Physician Assistant

## 2019-12-06 DIAGNOSIS — Z113 Encounter for screening for infections with a predominantly sexual mode of transmission: Secondary | ICD-10-CM

## 2019-12-06 DIAGNOSIS — Z7189 Other specified counseling: Secondary | ICD-10-CM

## 2019-12-06 LAB — WET PREP FOR TRICH, YEAST, CLUE
Trichomonas Exam: NEGATIVE
Yeast Exam: NEGATIVE

## 2019-12-06 NOTE — Progress Notes (Signed)
Here today for STD screening. Accepts bloodwork. Aziya Arena, RN ° °

## 2019-12-06 NOTE — Progress Notes (Signed)
Wet mount reviewed, no treatment indicated..Donica Derouin Brewer-Jensen, RN 

## 2019-12-07 NOTE — Progress Notes (Signed)
[facility] Department STI clinic/screening visit  Subjective:  Lisa [name] is a 49 y.o. female being seen today for an STI screening visit. The patient reports they do not have symptoms.  Patient reports that they do not desire a pregnancy in the next year.   They reported they are not interested in discussing contraception today.  Patient's last menstrual period was 07/17/2019 (approximate).   Patient has the following medical conditions:   Patient Active Problem List   Diagnosis Date Noted  . S/P hysterectomy 08/15/2019  . S/P laparoscopic hysterectomy 08/15/2019  . Chronic constipation 03/21/2019  . Menorrhagia 03/21/2019  . Migraine 03/21/2019  . Vocal cord polyps 03/21/2019  . Acute deep vein thrombosis (DVT) of popliteal vein of right lower extremity ([place]) 03/15/2019  . Iron deficiency anemia due to chronic blood loss 03/15/2019  . Symptomatic anemia 03/03/2019  . Asthma without status asthmaticus 08/10/2017  . Depression 08/10/2017  . Obesity, Class III, BMI 40-49.9 (morbid obesity) ([place]) 08/10/2017  . Prediabetes 04/19/2017  . Essential hypertension 04/16/2017  . DVT (deep venous thrombosis) ([place]) 12/13/2014  . Pulmonary embolism ([place]) 12/13/2014  . Nonspecific interstitial pneumonia ([place]) 08/22/2014  . Cough, persistent 04/30/2014  . Obstructive apnea 04/30/2014  . Endometrial polyp 09/30/2013  . High BMI 08/25/2013  . Dysmenorrhea 08/25/2013  . Abdominal bloating 08/25/2013  . Schizoaffective disorder ([place]) 05/12/2013  . PTSD (post-traumatic stress disorder) 05/12/2013  . Bipolar I disorder, most recent episode (or current) depressed, unspecified 05/12/2013  . Abdominal pain, generalized 05/28/2011  . Esophageal reflux 05/28/2011    Chief Complaint  Patient presents with  . SEXUALLY TRANSMITTED DISEASE    HPI  Patient reports that she has a new partner and would like a screening to make sure that everything is OK.  Denies symptoms.   States that she has multiple chronic conditions and takes multiple medicines.  Reports that she had her hysterectomy in February, but still has ovaries.  See flowsheet for further details and programmatic requirements.    The following portions of the patient's history were reviewed and updated as appropriate: allergies, current medications, past medical history, past social history, past surgical history and problem list.  Objective:  There were no vitals filed for this visit.  Physical Exam Constitutional:      General: She is not in acute distress.    Appearance: Normal appearance. She is obese.  HENT:     Head: Normocephalic and atraumatic.     Comments: No nits, lice, or hair loss. No cervical, supraclavicular or axillary adenopathy.    Mouth/Throat:     Mouth: Mucous membranes are moist.     Pharynx: Oropharynx is clear. No oropharyngeal exudate or posterior oropharyngeal erythema.  Eyes:     Conjunctiva/sclera: Conjunctivae normal.  Pulmonary:     Effort: Pulmonary effort is normal.  Abdominal:     Palpations: Abdomen is soft. There is no mass.     Tenderness: There is no abdominal tenderness. There is no guarding or rebound.  Genitourinary:    General: Normal vulva.     Rectum: Normal.     Comments: External genitalia/pubic area without nits, lice, edema, erythema, lesions and inguinal adenopathy. Vagina with normal mucosa and discharge. Cervix/uterus surgically absent. No adnexal tenderness or fullness. Musculoskeletal:     Cervical back: Neck supple. No tenderness.  Skin:    General: Skin is warm and dry.     Findings: No bruising, erythema, lesion or rash.  Neurological:  Mental Status: She is alert and oriented to person, place, and time.  Psychiatric:        Mood and Affect: Mood normal.        Behavior: Behavior normal.        Thought Content: Thought content normal.        Judgment: Judgment normal.      Assessment and Plan:  Lisa [name] is a 49 y.o. female presenting to the [facility] Department for STI screening  1. Screening for STD (sexually transmitted disease) Patient into clinic without symptoms. Rec condoms with all sex. Await test results.  Counseled that RN will call if needs to RTC for treatment once results are back. - WET PREP FOR [place], YEAST, CLUE - Gonococcus culture - Chlamydia/Gonorrhea [facility] - HIV [place] [facility] - Syphilis Serology, [facility]  2. Other specified counseling Patient enc to continue follow up with PCP for chronic conditions. Enc patient to follow up with Gyn per recs since s/p hysterectomy and enc to voice concerns re:  Hot flashes and other perimenopausal symptoms so that they can help her with those symptoms.      No follow-ups on file.  No future appointments.  Jerene Dilling, PA

## 2019-12-11 LAB — GONOCOCCUS CULTURE

## 2019-12-22 ENCOUNTER — Other Ambulatory Visit: Payer: Self-pay | Admitting: Specialist

## 2019-12-22 DIAGNOSIS — J8489 Other specified interstitial pulmonary diseases: Secondary | ICD-10-CM

## 2019-12-22 DIAGNOSIS — J849 Interstitial pulmonary disease, unspecified: Secondary | ICD-10-CM

## 2019-12-28 ENCOUNTER — Ambulatory Visit
Admission: RE | Admit: 2019-12-28 | Discharge: 2019-12-28 | Disposition: A | Discharge status: Home / Self Care | Payer: Medicare Other | Source: Ambulatory Visit | Attending: Specialist | Admitting: Specialist

## 2019-12-28 ENCOUNTER — Other Ambulatory Visit: Payer: Self-pay

## 2019-12-28 DIAGNOSIS — J8489 Other specified interstitial pulmonary diseases: Secondary | ICD-10-CM | POA: Diagnosis present

## 2019-12-28 DIAGNOSIS — J849 Interstitial pulmonary disease, unspecified: Secondary | ICD-10-CM | POA: Insufficient documentation

## 2020-05-06 ENCOUNTER — Other Ambulatory Visit: Payer: Self-pay | Admitting: *Deleted

## 2020-06-21 ENCOUNTER — Ambulatory Visit: Payer: Self-pay | Admitting: Physician Assistant

## 2020-06-21 ENCOUNTER — Encounter: Payer: Self-pay | Admitting: Physician Assistant

## 2020-06-21 ENCOUNTER — Other Ambulatory Visit: Payer: Self-pay

## 2020-06-21 DIAGNOSIS — N760 Acute vaginitis: Secondary | ICD-10-CM

## 2020-06-21 DIAGNOSIS — B9689 Other specified bacterial agents as the cause of diseases classified elsewhere: Secondary | ICD-10-CM

## 2020-06-21 DIAGNOSIS — Z113 Encounter for screening for infections with a predominantly sexual mode of transmission: Secondary | ICD-10-CM

## 2020-06-21 LAB — WET PREP FOR TRICH, YEAST, CLUE
Trichomonas Exam: NEGATIVE
Yeast Exam: NEGATIVE

## 2020-06-21 MED ORDER — METRONIDAZOLE 500 MG PO TABS: 500.0000 mg | ORAL_TABLET | Freq: Two times a day (BID) | ORAL | 0 refills | Status: DC

## 2020-06-21 NOTE — Progress Notes (Signed)
[facility] Department STI clinic/screening visit  Subjective:  Lisa [name] is a 49 y.o. female being seen today for an STI screening visit. The patient reports they do have symptoms.  Patient reports that they do not desire a pregnancy in the next year.   They reported they are not interested in discussing contraception today.  Patient's last menstrual period was 07/17/2019 (approximate).   Patient has the following medical conditions:   Patient Active Problem List   Diagnosis Date Noted  . S/P hysterectomy 08/15/2019  . S/P laparoscopic hysterectomy 08/15/2019  . Chronic constipation 03/21/2019  . Menorrhagia 03/21/2019  . Migraine 03/21/2019  . Vocal cord polyps 03/21/2019  . Acute deep vein thrombosis (DVT) of popliteal vein of right lower extremity ([place]) 03/15/2019  . Iron deficiency anemia due to chronic blood loss 03/15/2019  . Symptomatic anemia 03/03/2019  . Asthma without status asthmaticus 08/10/2017  . Depression 08/10/2017  . Obesity, Class III, BMI 40-49.9 (morbid obesity) ([place]) 08/10/2017  . Prediabetes 04/19/2017  . Essential hypertension 04/16/2017  . DVT (deep venous thrombosis) ([place]) 12/13/2014  . Pulmonary embolism ([place]) 12/13/2014  . Nonspecific interstitial pneumonia ([place]) 08/22/2014  . Cough, persistent 04/30/2014  . Obstructive apnea 04/30/2014  . Endometrial polyp 09/30/2013  . High BMI 08/25/2013  . Dysmenorrhea 08/25/2013  . Abdominal bloating 08/25/2013  . Schizoaffective disorder ([place]) 05/12/2013  . PTSD (post-traumatic stress disorder) 05/12/2013  . Bipolar I disorder, most recent episode (or current) depressed, unspecified 05/12/2013  . Abdominal pain, generalized 05/28/2011  . Esophageal reflux 05/28/2011    Chief Complaint  Patient presents with  . STD screening    HPI  Patient reports that she has had internal and external irritation for 2-3 days.  Denies any other symptoms.  Reviewed patient problem list,  medications and surgeries.  Per patient last HIV test was early this year prior to her surgery and last pap was    See flowsheet for further details and programmatic requirements.    The following portions of the patient's history were reviewed and updated as appropriate: allergies, current medications, past medical history, past social history, past surgical history and problem list.  Objective:  There were no vitals filed for this visit.  Physical Exam Constitutional:      General: She is not in acute distress.    Appearance: Normal appearance.  HENT:     Head: Normocephalic and atraumatic.     Comments: No nits,lice, or hair loss. No cervical, supraclavicular or axillary adenopathy.    Mouth/Throat:     Mouth: Mucous membranes are moist.     Pharynx: Oropharynx is clear. No oropharyngeal exudate or posterior oropharyngeal erythema.  Eyes:     Conjunctiva/sclera: Conjunctivae normal.  Pulmonary:     Effort: Pulmonary effort is normal.  Musculoskeletal:     Cervical back: Neck supple. No tenderness.  Skin:    General: Skin is warm and dry.     Findings: No bruising, erythema, lesion or rash.  Neurological:     Mental Status: She is alert and oriented to person, place, and time.  Psychiatric:        Mood and Affect: Mood normal.        Behavior: Behavior normal.        Thought Content: Thought content normal.        Judgment: Judgment normal.      Assessment and Plan:  Lisa [name] is a 49 y.o. female presenting to the [facility]  Department for STI screening  1. Screening for STD (sexually transmitted disease) Patient into clinic with symptoms. Patient opts to self-collect vaginal samples for testing today.  Counseled patient how to collect to have accurate results.  Rec condoms with all sex. Await test results.  Counseled that RN will call if needs to RTC for treatment once results are back. - WET PREP FOR [place], YEAST, CLUE - Gonococcus  culture - HIV [place] [facility] - Syphilis Serology, [place] - Chlamydia/Gonorrhea [facility]  2. BV (bacterial vaginosis) Will treat for BV with Metronidazole 500 mg #14 1 po BID for 7 days with food, no EtOH for 24 hr before and until 72 hr after completing medicine. No sex for 10 days. Enc to use OTC antifungal cream if she has itching during or just after treatment with antibiotic. - metroNIDAZOLE (FLAGYL) 500 MG tablet; Take 1 tablet (500 mg total) by mouth 2 (two) times daily.  Dispense: 14 tablet; Refill: 0     No follow-ups on file.  No future appointments.  Jerene Dilling, PA

## 2020-06-23 NOTE — Progress Notes (Signed)
Chart reviewed by Pharmacist  Suzanne Walker PharmD, Contract Pharmacist at [facility]  

## 2020-06-25 LAB — GONOCOCCUS CULTURE

## 2021-01-29 ENCOUNTER — Other Ambulatory Visit: Payer: Self-pay

## 2021-01-29 ENCOUNTER — Ambulatory Visit: Payer: Self-pay | Admitting: Physician Assistant

## 2021-01-29 DIAGNOSIS — B9689 Other specified bacterial agents as the cause of diseases classified elsewhere: Secondary | ICD-10-CM

## 2021-01-29 DIAGNOSIS — Z113 Encounter for screening for infections with a predominantly sexual mode of transmission: Secondary | ICD-10-CM

## 2021-01-29 DIAGNOSIS — N760 Acute vaginitis: Secondary | ICD-10-CM

## 2021-01-29 LAB — WET PREP FOR TRICH, YEAST, CLUE
Trichomonas Exam: NEGATIVE
Yeast Exam: NEGATIVE

## 2021-01-29 MED ORDER — METRONIDAZOLE 500 MG PO TABS: 500.0000 mg | ORAL_TABLET | Freq: Two times a day (BID) | ORAL | 0 refills | Status: AC

## 2021-01-30 ENCOUNTER — Encounter: Payer: Self-pay | Admitting: Physician Assistant

## 2021-01-30 NOTE — Progress Notes (Signed)
[facility] Department STI clinic/screening visit  Subjective:  Lisa [name] is a 50 y.o. female being seen today for an STI screening visit. The patient reports they do have symptoms.  Patient reports that they do not desire a pregnancy in the next year.   They reported they are not interested in discussing contraception today.  Patient's last menstrual period was 07/17/2019 (approximate).   Patient has the following medical conditions:   Patient Active Problem List   Diagnosis Date Noted   S/P hysterectomy 08/15/2019   S/P laparoscopic hysterectomy 08/15/2019   Chronic constipation 03/21/2019   Menorrhagia 03/21/2019   Migraine 03/21/2019   Vocal cord polyps 03/21/2019   Acute deep vein thrombosis (DVT) of popliteal vein of right lower extremity ([place]) 03/15/2019   Iron deficiency anemia due to chronic blood loss 03/15/2019   Symptomatic anemia 03/03/2019   Asthma without status asthmaticus 08/10/2017   Depression 08/10/2017   Obesity, Class III, BMI 40-49.9 (morbid obesity) ([place]) 08/10/2017   Prediabetes 04/19/2017   Essential hypertension 04/16/2017   DVT (deep venous thrombosis) ([place]) 12/13/2014   Pulmonary embolism ([place]) 12/13/2014   Nonspecific interstitial pneumonia ([place]) 08/22/2014   Cough, persistent 04/30/2014   Obstructive apnea 04/30/2014   Endometrial polyp 09/30/2013   High BMI 08/25/2013   Dysmenorrhea 08/25/2013   Abdominal bloating 08/25/2013   Schizoaffective disorder ([place]) 05/12/2013   PTSD (post-traumatic stress disorder) 05/12/2013   Bipolar I disorder, most recent episode (or current) depressed, unspecified 05/12/2013   Abdominal pain, generalized 05/28/2011   Esophageal reflux 05/28/2011    Chief Complaint  Patient presents with   SEXUALLY TRANSMITTED DISEASE    screening    HPI  Patient reports that she has had dark urine and a vaginal odor for 5 days.  Denies other symptoms and states that she takes medicines as  prescribed for Asthma, HTN, non-specific interstital pneumonia, and blood thinner.  States last HIV test was in 2021 and last pap was in 2020, prior to her hysterectomy.  See flowsheet for further details and programmatic requirements.    The following portions of the patient's history were reviewed and updated as appropriate: allergies, current medications, past medical history, past social history, past surgical history and problem list.  Objective:  There were no vitals filed for this visit.  Physical Exam Constitutional:      General: She is not in acute distress.    Appearance: Normal appearance.  HENT:     Head: Normocephalic and atraumatic.     Comments: No nits,lice, or hair loss. No cervical, supraclavicular or axillary adenopathy.     Mouth/Throat:     Mouth: Mucous membranes are moist.     Pharynx: Oropharynx is clear. No oropharyngeal exudate or posterior oropharyngeal erythema.  Eyes:     Conjunctiva/sclera: Conjunctivae normal.  Pulmonary:     Effort: Pulmonary effort is normal.  Abdominal:     Palpations: Abdomen is soft. There is no mass.     Tenderness: There is no abdominal tenderness. There is no guarding or rebound.  Genitourinary:    General: Normal vulva.     Rectum: Normal.     Comments: External genitalia/pubic area without nits, lice, edema, erythema, lesions and inguinal adenopathy. Vagina with normal mucosa and discharge. Cervix and uterus surgically absent. Musculoskeletal:     Cervical back: Neck supple. No tenderness.  Skin:    General: Skin is warm and dry.     Findings: No bruising, erythema, lesion or rash.  Neurological:  Mental Status: She is alert and oriented to person, place, and time.  Psychiatric:        Mood and Affect: Mood normal.        Behavior: Behavior normal.        Thought Content: Thought content normal.        Judgment: Judgment normal.     Assessment and Plan:  Lisa [name] is a 50 y.o. female  presenting to the [facility] Department for STI screening  1. Screening for STD (sexually transmitted disease) Patient into clinic with symptoms. Reviewed with patient wet mount results. Rec condoms with all sex. Await test results.  Counseled that RN will call if needs to RTC for treatment once results are back.  - WET PREP FOR [place], YEAST, CLUE - Gonococcus culture - Chlamydia/Gonorrhea [facility] - HIV [place] [facility] - Syphilis Serology, [facility]  2. BV (bacterial vaginosis) Reviewed with patient steps to prevent BV and yeast: Wear all-cotton underwear Sleep without underwear Take showers instead of baths Wear loose fitting clothing, especially during warm/hot weather Use a hair dryer on low after bathing to dry the area Avoid scented soaps and body washes Do not douche May try over the counter probiotics or boric acid gel or suppositories Stop smoking  Treat BV with Metronidazole 500 mg #14 1 po BID for 7 days with food, no EtOH for 24 hr before and until 72 hr after completing medicine. No sex for 10 days. Enc to use OTC antifungal cream if has itching during or just after antibiotic use.  - metroNIDAZOLE (FLAGYL) 500 MG tablet; Take 1 tablet (500 mg total) by mouth 2 (two) times daily for 7 days.  Dispense: 14 tablet; Refill: 0     No follow-ups on file.  No future appointments.  Jerene Dilling, PA

## 2021-02-01 NOTE — Progress Notes (Signed)
Chart reviewed by Pharmacist  Suzanne Walker PharmD, Contract Pharmacist at [facility]  

## 2021-02-02 LAB — GONOCOCCUS CULTURE

## 2021-02-04 LAB — HM HIV SCREENING LAB: HM HIV Screening: NEGATIVE

## 2021-06-02 DIAGNOSIS — Z9884 Bariatric surgery status: Secondary | ICD-10-CM | POA: Insufficient documentation

## 2021-08-22 ENCOUNTER — Ambulatory Visit: Payer: Medicare Other

## 2021-11-06 ENCOUNTER — Ambulatory Visit
Admission: RE | Admit: 2021-11-06 | Discharge: 2021-11-06 | Disposition: A | Discharge status: Home / Self Care | Payer: Medicare Other | Attending: Family Medicine | Admitting: Family Medicine

## 2021-11-06 ENCOUNTER — Other Ambulatory Visit: Payer: Self-pay | Admitting: Family Medicine

## 2021-11-06 ENCOUNTER — Ambulatory Visit
Admission: RE | Admit: 2021-11-06 | Discharge: 2021-11-06 | Disposition: A | Discharge status: Home / Self Care | Payer: Medicare Other | Source: Ambulatory Visit | Attending: Family Medicine | Admitting: Family Medicine

## 2021-11-06 DIAGNOSIS — M5416 Radiculopathy, lumbar region: Secondary | ICD-10-CM

## 2021-11-11 ENCOUNTER — Ambulatory Visit
Admission: RE | Admit: 2021-11-11 | Discharge: 2021-11-11 | Disposition: A | Discharge status: Home / Self Care | Payer: Medicare Other | Source: Ambulatory Visit | Attending: Family Medicine | Admitting: Family Medicine

## 2021-11-11 ENCOUNTER — Other Ambulatory Visit: Payer: Self-pay | Admitting: Family Medicine

## 2021-11-11 ENCOUNTER — Ambulatory Visit
Admission: RE | Admit: 2021-11-11 | Discharge: 2021-11-11 | Disposition: A | Discharge status: Home / Self Care | Payer: Medicare Other | Attending: Family Medicine | Admitting: Family Medicine

## 2021-11-11 DIAGNOSIS — M542 Cervicalgia: Secondary | ICD-10-CM | POA: Diagnosis present

## 2021-11-11 DIAGNOSIS — M546 Pain in thoracic spine: Secondary | ICD-10-CM | POA: Insufficient documentation

## 2021-11-11 DIAGNOSIS — G8929 Other chronic pain: Secondary | ICD-10-CM

## 2021-11-18 ENCOUNTER — Other Ambulatory Visit: Payer: Self-pay | Admitting: Physical Medicine & Rehabilitation

## 2021-11-18 DIAGNOSIS — G8929 Other chronic pain: Secondary | ICD-10-CM

## 2021-11-27 ENCOUNTER — Ambulatory Visit
Admission: RE | Admit: 2021-11-27 | Discharge: 2021-11-27 | Disposition: A | Discharge status: Home / Self Care | Payer: Medicare Other | Source: Ambulatory Visit | Attending: Physical Medicine & Rehabilitation | Admitting: Physical Medicine & Rehabilitation

## 2021-11-27 DIAGNOSIS — M5441 Lumbago with sciatica, right side: Secondary | ICD-10-CM | POA: Insufficient documentation

## 2021-11-27 DIAGNOSIS — G8929 Other chronic pain: Secondary | ICD-10-CM | POA: Diagnosis present

## 2021-11-27 DIAGNOSIS — M5442 Lumbago with sciatica, left side: Secondary | ICD-10-CM | POA: Insufficient documentation

## 2022-01-01 ENCOUNTER — Other Ambulatory Visit: Payer: Self-pay | Admitting: Neurosurgery

## 2022-01-01 DIAGNOSIS — Z01818 Encounter for other preprocedural examination: Secondary | ICD-10-CM

## 2022-01-07 ENCOUNTER — Other Ambulatory Visit: Payer: Medicare Other

## 2022-01-07 ENCOUNTER — Encounter
Admission: RE | Admit: 2022-01-07 | Discharge: 2022-01-07 | Disposition: A | Discharge status: Home / Self Care | Payer: Medicare Other | Source: Ambulatory Visit | Attending: Neurosurgery | Admitting: Neurosurgery

## 2022-01-07 VITALS — BP 121/67 | HR 57 | Resp 16 | Ht 67.5 in | Wt 204.8 lb

## 2022-01-07 DIAGNOSIS — R7303 Prediabetes: Secondary | ICD-10-CM | POA: Insufficient documentation

## 2022-01-07 DIAGNOSIS — I10 Essential (primary) hypertension: Secondary | ICD-10-CM | POA: Diagnosis not present

## 2022-01-07 DIAGNOSIS — Z7901 Long term (current) use of anticoagulants: Secondary | ICD-10-CM | POA: Insufficient documentation

## 2022-01-07 DIAGNOSIS — Z01818 Encounter for other preprocedural examination: Secondary | ICD-10-CM | POA: Diagnosis present

## 2022-01-07 HISTORY — DX: Personal history of other diseases of the digestive system: Z87.19

## 2022-01-07 HISTORY — DX: Polyp of vocal cord and larynx: J38.1

## 2022-01-07 HISTORY — DX: Personal history of Methicillin resistant Staphylococcus aureus infection: Z86.14

## 2022-01-07 HISTORY — DX: Unspecified osteoarthritis, unspecified site: M19.90

## 2022-01-07 HISTORY — DX: Personal history of urinary calculi: Z87.442

## 2022-01-07 HISTORY — DX: Prediabetes: R73.03

## 2022-01-07 LAB — SURGICAL PCR SCREEN
MRSA, PCR: NEGATIVE
Staphylococcus aureus: NEGATIVE

## 2022-01-07 LAB — TYPE AND SCREEN
ABO/RH(D): B POS
Antibody Screen: NEGATIVE

## 2022-01-07 LAB — URINALYSIS, ROUTINE W REFLEX MICROSCOPIC
Bilirubin Urine: NEGATIVE
Glucose, UA: NEGATIVE mg/dL
Hgb urine dipstick: NEGATIVE
Ketones, ur: NEGATIVE mg/dL
Leukocytes,Ua: NEGATIVE
Nitrite: NEGATIVE
Protein, ur: NEGATIVE mg/dL
Specific Gravity, Urine: 1.015 (ref 1.005–1.030)
pH: 5 (ref 5.0–8.0)

## 2022-01-07 NOTE — Patient Instructions (Addendum)
Your procedure is scheduled on:01-19-22 Monday Report to the Registration Desk on [facility]st [facility]Then proceed to the 2nd floor Surgery Desk To find out your arrival time, please call [phone] between 1PM - 3PM on:01-16-22 Friday If your arrival time is 6:00 am, do not arrive prior to that time as the [facility] entrance doors do not open until 6:00 am.  REMEMBER: Instructions that are not followed completely may result in serious medical risk, up to and including death; or upon the discretion of your surgeon and anesthesiologist your surgery may need to be rescheduled.  Do not eat food after midnight the night before surgery.  No gum chewing, lozengers or hard candies.  You may however, drink CLEAR liquids up to 2 hours before you are scheduled to arrive for your surgery. Do not drink anything within 2 hours of your scheduled arrival time.  Clear liquids include: - water  - apple juice without pulp - gatorade (not RED colors) - black coffee or tea (Do NOT add milk or creamers to the coffee or tea) Do NOT drink anything that is not on this list  TAKE THESE MEDICATIONS THE MORNING OF SURGERY WITH A SIP OF WATER: -gabapentin (NEURONTIN) -lamoTRIgine (LAMICTAL) -azaTHIOprine (IMURAN)  -pantoprazole (PROTONIX)-take one the night before surgery and one the morning of surgery  Stop your Coumadin 7 days prior to surgery-Last dose will be on 01-11-22 Sunday  Use your Combivent and Albuterol Inhaler the day of surgery and bring your Albuterol Inhaler the day of surgery  One week prior to surgery: Stop Anti-inflammatories (NSAIDS) such as Advil, Aleve, Ibuprofen, Motrin, Naproxen, Naprosyn and Aspirin based products such as Excedrin, Goodys Powder, BC Powder.You may however, take Tylenol if needed for pain up until the day of surgery.  Stop ANY OVER THE COUNTER supplements/vitamins 7 days prior to surgery   No Alcohol for 24 hours before or after surgery.  No Smoking  including e-cigarettes for 24 hours prior to surgery.  No chewable tobacco products for at least 6 hours prior to surgery.  No nicotine patches on the day of surgery.  Do not use any "recreational" drugs for at least a week prior to your surgery.  Please be advised that the combination of cocaine and anesthesia may have negative outcomes, up to and including death. If you test positive for cocaine, your surgery will be cancelled.  On the morning of surgery brush your teeth with toothpaste and water, you may rinse your mouth with mouthwash if you wish. Do not swallow any toothpaste or mouthwash.  Use CHG Soap as directed on instruction sheet.  Do not wear jewelry, make-up, hairpins, clips or nail polish.  Do not wear lotions, powders, or perfumes.   Do not shave body from the neck down 48 hours prior to surgery just in case you cut yourself which could leave a site for infection.  Also, freshly shaved skin may become irritated if using the CHG soap.  Contact lenses, hearing aids and dentures may not be worn into surgery.  Do not bring valuables to the hospital. [facility] is not responsible for any missing/lost belongings or valuables.   Notify your doctor if there is any change in your medical condition (cold, fever, infection).  Wear comfortable clothing (specific to your surgery type) to the hospital.  After surgery, you can help prevent lung complications by doing breathing exercises.  Take deep breaths and cough every 1-2 hours. Your doctor may order a device called  an Chiropodist to help you take deep breaths. When coughing or sneezing, hold a pillow firmly against your incision with both hands. This is called "splinting." Doing this helps protect your incision. It also decreases belly discomfort.  If you are being admitted to the hospital overnight, leave your suitcase in the car. After surgery it may be brought to your room.  If you are being discharged the day of  surgery, you will not be allowed to drive home. You will need a responsible adult (18 years or older) to drive you home and stay with you that night.   If you are taking public transportation, you will need to have a responsible adult (18 years or older) with you. Please confirm with your physician that it is acceptable to use public transportation.   Please call the [facility] at [phone] if you have any questions about these instructions.  Surgery Visitation Policy:  Patients undergoing a surgery or procedure may have two family members or support persons with them as long as the person is not COVID-19 positive or experiencing its symptoms.   Inpatient Visitation:    Visiting hours are 7 a.m. to 8 p.m. Up to four visitors are allowed at one time in a patient room, including children. The visitors may rotate out with other people during the day. One designated support person (adult) may remain overnight.

## 2022-01-07 NOTE — Pre-Procedure Instructions (Signed)
Kendalyn RN for Dr Izora Ribas will call pt and let her know when to start her Lovenox bridge-Pt is aware to stop her Coumadin 7 days prior to surgery but was unsure of when her lovenox bridge was to be started. Genevie Cheshire will call pt and inform her of this

## 2022-01-14 ENCOUNTER — Other Ambulatory Visit: Payer: Self-pay | Admitting: Neurosurgery

## 2022-01-14 ENCOUNTER — Other Ambulatory Visit: Payer: Self-pay

## 2022-01-14 DIAGNOSIS — I82441 Acute embolism and thrombosis of right tibial vein: Secondary | ICD-10-CM

## 2022-01-14 MED ORDER — ENOXAPARIN SODIUM 150 MG/ML IJ SOSY: 30.0000 mg | PREFILLED_SYRINGE | INTRAMUSCULAR | Status: AC

## 2022-01-14 MED ORDER — ENOXAPARIN SODIUM 30 MG/0.3ML IJ SOSY: 30.0000 mg | PREFILLED_SYRINGE | INTRAMUSCULAR | 0 refills | Status: DC

## 2022-01-14 NOTE — Telephone Encounter (Signed)
Pt called that she was at [facility] and they didn't have the lovenox that Dr Izora Ribas sent in. It appears the originally rx was sent in under a clinic administered medication. Rx has been resent.

## 2022-01-15 ENCOUNTER — Ambulatory Visit: Payer: Self-pay | Admitting: Physician Assistant

## 2022-01-15 ENCOUNTER — Encounter: Payer: Self-pay | Admitting: Physician Assistant

## 2022-01-15 ENCOUNTER — Encounter: Payer: Self-pay | Admitting: Internal Medicine

## 2022-01-15 DIAGNOSIS — Z113 Encounter for screening for infections with a predominantly sexual mode of transmission: Secondary | ICD-10-CM

## 2022-01-15 LAB — WET PREP FOR TRICH, YEAST, CLUE
Trichomonas Exam: NEGATIVE
Yeast Exam: NEGATIVE

## 2022-01-15 LAB — HM HIV SCREENING LAB: HM HIV Screening: NEGATIVE

## 2022-01-15 LAB — HM HEPATITIS C SCREENING LAB: HM Hepatitis Screen: NEGATIVE

## 2022-01-15 NOTE — Progress Notes (Signed)
[facility] Department  STI clinic/screening visit [place] [address] [phone]  Subjective:  Lisa [name] is a 51 y.o. female being seen today for an STI screening visit. The patient reports they do have symptoms.  Patient reports that they do not desire a pregnancy in the next year.   They reported they are not interested in discussing contraception today.    Patient's last menstrual period was 07/17/2019 (approximate).   Patient has the following medical conditions:   Patient Active Problem List   Diagnosis Date Noted   S/P hysterectomy 08/15/2019   S/P laparoscopic hysterectomy 08/15/2019   Chronic constipation 03/21/2019   Menorrhagia 03/21/2019   Migraine 03/21/2019   Vocal cord polyps 03/21/2019   Acute deep vein thrombosis (DVT) of popliteal vein of right lower extremity ([place]) 03/15/2019   Iron deficiency anemia due to chronic blood loss 03/15/2019   Symptomatic anemia 03/03/2019   Asthma without status asthmaticus 08/10/2017   Depression 08/10/2017   Obesity, Class III, BMI 40-49.9 (morbid obesity) ([place]) 08/10/2017   Prediabetes 04/19/2017   Essential hypertension 04/16/2017   DVT (deep venous thrombosis) ([place]) 12/13/2014   Pulmonary embolism ([place]) 12/13/2014   Nonspecific interstitial pneumonia ([place]) 08/22/2014   Cough, persistent 04/30/2014   Obstructive apnea 04/30/2014   Endometrial polyp 09/30/2013   High BMI 08/25/2013   Dysmenorrhea 08/25/2013   Abdominal bloating 08/25/2013   Schizoaffective disorder ([place]) 05/12/2013   PTSD (post-traumatic stress disorder) 05/12/2013   Bipolar I disorder, most recent episode (or current) depressed, unspecified 05/12/2013   Abdominal pain, generalized 05/28/2011   Esophageal reflux 05/28/2011    Chief Complaint  Patient presents with   SEXUALLY TRANSMITTED DISEASE    STI screening due to partner cheating. Has some burning around labial area    Woman here for eval  of 1 week h/o vaginal irritation, no discharge. Wants STI screen due to partner infidelity. Will have back surgery next week, anticipating 4 month recovery (her 2 sons will assist at home).   Patient reports her partner who uses drugs not prescribed for him, has had another sexual partner, and wants STI screening.   Last HIV test per patient/review of record was 08/15/21. Patient reports last pap was unsure - S/P hysterectomy for benign disease 08/2019.   Screening for MPX risk: Does the patient have an unexplained rash? No Is the patient MSM? No Does the patient endorse multiple sex partners or anonymous sex partners? No Did the patient have close or sexual contact with a person diagnosed with MPX? No Has the patient traveled outside the [place] where MPX is endemic? No Is there a high clinical suspicion for MPX-- evidenced by one of the following No  -Unlikely to be chickenpox  -Lymphadenopathy  -Rash that present in same phase of evolution on any given body part See flowsheet for further details and programmatic requirements.   Immunization history:  Immunization History  Administered Date(s) Administered   Influenza,inj,Quad PF,6+ Mos 05/13/2013   Pneumococcal Polysaccharide-23 05/13/2013   Tdap 11/01/2010     The following portions of the patient's history were reviewed and updated as appropriate: allergies, current medications, past medical history, past social history, past surgical history and problem list.  Objective:  There were no vitals filed for this visit.  Physical Exam Vitals and nursing note reviewed.  Constitutional:      Appearance: Normal appearance. She is obese.  HENT:     Head: Normocephalic and atraumatic.     Mouth/Throat:  Mouth: Mucous membranes are moist.     Pharynx: Oropharynx is clear. No oropharyngeal exudate or posterior oropharyngeal erythema.  Pulmonary:     Effort: Pulmonary effort is normal.  Abdominal:     General: Abdomen is flat.      Palpations: There is no mass.     Tenderness: There is no abdominal tenderness. There is no rebound.  Genitourinary:    General: Normal vulva.     Exam position: Lithotomy position.     Pubic Area: No rash or pubic lice.      Labia:        Right: No rash or lesion.        Left: No rash or lesion.      Vagina: Normal. No vaginal discharge, erythema, bleeding or lesions.     Uterus: Absent.      Adnexa:        Right: No tenderness.         Left: No tenderness.       Rectum: Normal.     Comments: pH = <4.5 Uterus not palpable Lymphadenopathy:     Head:     Right side of head: No preauricular or posterior auricular adenopathy.     Left side of head: No preauricular or posterior auricular adenopathy.     Cervical: No cervical adenopathy.     Upper Body:     Right upper body: No supraclavicular, axillary or epitrochlear adenopathy.     Left upper body: No supraclavicular, axillary or epitrochlear adenopathy.     Lower Body: No right inguinal adenopathy. No left inguinal adenopathy.  Skin:    General: Skin is warm and dry.     Findings: No rash.  Neurological:     Mental Status: She is alert and oriented to person, place, and time.      Assessment and Plan:  Lisa [name] is a 51 y.o. female presenting to the [facility] Department for STI screening  1. Routine screening for STI (sexually transmitted infection) Wet prep neg. Await remaining STI test results. Recommend condoms with all sex. - HIV/HCV [facility] - Syphilis Serology, [place] [facility] - Chlamydia/Gonorrhea [place] [facility] - Gonococcus culture - WET PREP FOR [place], [place]   Return in about 6 months (around 07/18/2022) for STI screening.  Future Appointments  Date Time Provider [place]  02/03/2022  2:30 PM Loleta Dicker, [place] [facility] None  02/18/2022  9:00 AM Ursula Alert, MD [facility] None    Lora Havens, PA-C

## 2022-01-15 NOTE — Progress Notes (Signed)
WET PREP negative. No treatment indicated today in clinic. Pt verbalized understanding of further labwork pending. Keitha Butte RN

## 2022-01-18 MED ORDER — CEFAZOLIN SODIUM-DEXTROSE 2-4 GM/100ML-% IV SOLN
2.0000 g | INTRAVENOUS | Status: AC
  Administered 2022-01-19: 2 g via INTRAVENOUS

## 2022-01-18 NOTE — Progress Notes (Signed)
Pharmacy Antibiotic Note  Lisa [name] is a 51 y.o. female admitted on (Not on file) with surgical prophylaxis.  Pharmacy has been consulted for Cefazolin dosing.  Plan: TBW = 92.9 kg  Cefazolin 2 gm IV X 1 60 min pre-op ordered for 7/10 @ 0500.      No data recorded.  No results for input(s): "WBC", "CREATININE", "LATICACIDVEN", "VANCOTROUGH", "VANCOPEAK", "VANCORANDOM", "GENTTROUGH", "GENTPEAK", "GENTRANDOM", "TOBRATROUGH", "TOBRAPEAK", "TOBRARND", "AMIKACINPEAK", "AMIKACINTROU", "AMIKACIN" in the last 168 hours.  CrCl cannot be calculated (Patient's most recent lab result is older than the maximum 21 days allowed.).    Allergies  Allergen Reactions   Lisinopril Anaphylaxis    Facial swelling, difficulty breathing   Morphine And Related Other (See Comments)    migraine    Antimicrobials this admission:   >>    >>   Dose adjustments this admission:   Microbiology results:  BCx:   UCx:    Sputum:    MRSA PCR:   Thank you for allowing pharmacy to be a part of this patient's care.  Samar Dass D 01/18/2022 11:18 PM

## 2022-01-19 ENCOUNTER — Ambulatory Visit: Payer: Medicare Other | Admitting: Anesthesiology

## 2022-01-19 ENCOUNTER — Ambulatory Visit: Payer: Medicare Other | Admitting: Urgent Care

## 2022-01-19 ENCOUNTER — Inpatient Hospital Stay
Admission: RE | Admit: 2022-01-19 | Discharge: 2022-01-24 | DRG: 519 | Disposition: A | Discharge status: Home Health | Payer: Medicare Other | Attending: Neurosurgery | Admitting: Neurosurgery

## 2022-01-19 ENCOUNTER — Encounter: Payer: Self-pay | Admitting: Neurosurgery

## 2022-01-19 ENCOUNTER — Ambulatory Visit: Payer: Medicare Other

## 2022-01-19 ENCOUNTER — Encounter
Admission: RE | Disposition: A | Discharge status: Home Health | Payer: Self-pay | Source: Home / Self Care | Attending: Neurosurgery

## 2022-01-19 ENCOUNTER — Other Ambulatory Visit: Payer: Self-pay

## 2022-01-19 DIAGNOSIS — Z86718 Personal history of other venous thrombosis and embolism: Secondary | ICD-10-CM

## 2022-01-19 DIAGNOSIS — Z7951 Long term (current) use of inhaled steroids: Secondary | ICD-10-CM

## 2022-01-19 DIAGNOSIS — G43909 Migraine, unspecified, not intractable, without status migrainosus: Secondary | ICD-10-CM | POA: Diagnosis present

## 2022-01-19 DIAGNOSIS — G4733 Obstructive sleep apnea (adult) (pediatric): Secondary | ICD-10-CM | POA: Diagnosis present

## 2022-01-19 DIAGNOSIS — Z801 Family history of malignant neoplasm of trachea, bronchus and lung: Secondary | ICD-10-CM

## 2022-01-19 DIAGNOSIS — I10 Essential (primary) hypertension: Secondary | ICD-10-CM | POA: Diagnosis present

## 2022-01-19 DIAGNOSIS — M48062 Spinal stenosis, lumbar region with neurogenic claudication: Principal | ICD-10-CM | POA: Diagnosis present

## 2022-01-19 DIAGNOSIS — G8929 Other chronic pain: Secondary | ICD-10-CM | POA: Diagnosis present

## 2022-01-19 DIAGNOSIS — J45909 Unspecified asthma, uncomplicated: Secondary | ICD-10-CM | POA: Diagnosis present

## 2022-01-19 DIAGNOSIS — Z803 Family history of malignant neoplasm of breast: Secondary | ICD-10-CM

## 2022-01-19 DIAGNOSIS — Z800 Family history of malignant neoplasm of digestive organs: Secondary | ICD-10-CM

## 2022-01-19 DIAGNOSIS — Z833 Family history of diabetes mellitus: Secondary | ICD-10-CM

## 2022-01-19 DIAGNOSIS — Z79899 Other long term (current) drug therapy: Secondary | ICD-10-CM

## 2022-01-19 DIAGNOSIS — G9741 Accidental puncture or laceration of dura during a procedure: Secondary | ICD-10-CM | POA: Diagnosis not present

## 2022-01-19 DIAGNOSIS — Z01818 Encounter for other preprocedural examination: Secondary | ICD-10-CM

## 2022-01-19 DIAGNOSIS — Z811 Family history of alcohol abuse and dependence: Secondary | ICD-10-CM

## 2022-01-19 DIAGNOSIS — Z823 Family history of stroke: Secondary | ICD-10-CM

## 2022-01-19 DIAGNOSIS — Z825 Family history of asthma and other chronic lower respiratory diseases: Secondary | ICD-10-CM

## 2022-01-19 DIAGNOSIS — Z7901 Long term (current) use of anticoagulants: Secondary | ICD-10-CM

## 2022-01-19 DIAGNOSIS — M5416 Radiculopathy, lumbar region: Principal | ICD-10-CM | POA: Diagnosis present

## 2022-01-19 DIAGNOSIS — K219 Gastro-esophageal reflux disease without esophagitis: Secondary | ICD-10-CM | POA: Diagnosis present

## 2022-01-19 DIAGNOSIS — E119 Type 2 diabetes mellitus without complications: Secondary | ICD-10-CM | POA: Diagnosis present

## 2022-01-19 DIAGNOSIS — Z8051 Family history of malignant neoplasm of kidney: Secondary | ICD-10-CM

## 2022-01-19 DIAGNOSIS — Z8249 Family history of ischemic heart disease and other diseases of the circulatory system: Secondary | ICD-10-CM

## 2022-01-19 HISTORY — PX: LUMBAR LAMINECTOMY/DECOMPRESSION MICRODISCECTOMY: SHX5026

## 2022-01-19 LAB — PROTIME-INR
INR: 1.1 (ref 0.8–1.2)
Prothrombin Time: 13.6 seconds (ref 11.4–15.2)

## 2022-01-19 LAB — GONOCOCCUS CULTURE

## 2022-01-19 SURGERY — LUMBAR LAMINECTOMY/DECOMPRESSION MICRODISCECTOMY 2 LEVELS
Anesthesia: General | Site: Spine Lumbar

## 2022-01-19 MED ORDER — SURGIFLO WITH THROMBIN (HEMOSTATIC MATRIX KIT) OPTIME
TOPICAL | Status: DC | PRN
  Administered 2022-01-19: 1 via TOPICAL

## 2022-01-19 MED ORDER — BUPIVACAINE HCL (PF) 0.5 % IJ SOLN
INTRAMUSCULAR | Status: AC
  Filled 2022-01-19: qty 30

## 2022-01-19 MED ORDER — CHLORHEXIDINE GLUCONATE 0.12 % MT SOLN: 15.0000 mL | Freq: Once | OROMUCOSAL | Status: AC

## 2022-01-19 MED ORDER — CHLORHEXIDINE GLUCONATE 0.12 % MT SOLN
OROMUCOSAL | Status: AC
  Administered 2022-01-19: 15 mL via OROMUCOSAL
  Filled 2022-01-19: qty 15

## 2022-01-19 MED ORDER — SODIUM CHLORIDE 0.9 % IV SOLN: INTRAVENOUS | Status: DC

## 2022-01-19 MED ORDER — ACETAMINOPHEN 10 MG/ML IV SOLN: 1000.0000 mg | Freq: Once | INTRAVENOUS | Status: DC | PRN

## 2022-01-19 MED ORDER — DEXAMETHASONE SODIUM PHOSPHATE 10 MG/ML IJ SOLN
INTRAMUSCULAR | Status: DC | PRN
  Administered 2022-01-19: 10 mg via INTRAVENOUS

## 2022-01-19 MED ORDER — ONDANSETRON HCL 4 MG/2ML IJ SOLN
4.0000 mg | Freq: Once | INTRAMUSCULAR | Status: DC | PRN
Start: 2022-01-19 — End: 2022-01-19

## 2022-01-19 MED ORDER — GABAPENTIN 300 MG PO CAPS
300.0000 mg | ORAL_CAPSULE | Freq: Two times a day (BID) | ORAL | Status: DC
  Administered 2022-01-19 – 2022-01-21 (×4): 300 mg via ORAL
  Filled 2022-01-19 (×4): qty 1

## 2022-01-19 MED ORDER — ACETAMINOPHEN 10 MG/ML IV SOLN
INTRAVENOUS | Status: AC
  Filled 2022-01-19: qty 100

## 2022-01-19 MED ORDER — BUDESONIDE 0.25 MG/2ML IN SUSP
0.2500 mg | Freq: Two times a day (BID) | RESPIRATORY_TRACT | Status: DC
  Administered 2022-01-19 – 2022-01-24 (×10): 0.25 mg via RESPIRATORY_TRACT
  Filled 2022-01-19 (×10): qty 2

## 2022-01-19 MED ORDER — ORAL CARE MOUTH RINSE: 15.0000 mL | Freq: Once | OROMUCOSAL | Status: AC

## 2022-01-19 MED ORDER — DEXMEDETOMIDINE (PRECEDEX) IN NS 20 MCG/5ML (4 MCG/ML) IV SYRINGE
PREFILLED_SYRINGE | INTRAVENOUS | Status: DC | PRN
  Administered 2022-01-19: 8 ug via INTRAVENOUS

## 2022-01-19 MED ORDER — FLEET ENEMA 7-19 GM/118ML RE ENEM
1.0000 | ENEMA | Freq: Once | RECTAL | Status: AC | PRN
  Administered 2022-01-23: 1 via RECTAL

## 2022-01-19 MED ORDER — SUCCINYLCHOLINE CHLORIDE 200 MG/10ML IV SOSY
PREFILLED_SYRINGE | INTRAVENOUS | Status: AC
  Filled 2022-01-19: qty 10

## 2022-01-19 MED ORDER — ONDANSETRON HCL 4 MG/2ML IJ SOLN
INTRAMUSCULAR | Status: DC | PRN
  Administered 2022-01-19: 4 mg via INTRAVENOUS

## 2022-01-19 MED ORDER — DEXAMETHASONE SODIUM PHOSPHATE 10 MG/ML IJ SOLN
INTRAMUSCULAR | Status: AC
  Filled 2022-01-19: qty 1

## 2022-01-19 MED ORDER — PHENYLEPHRINE HCL (PRESSORS) 10 MG/ML IV SOLN
INTRAVENOUS | Status: DC | PRN
  Administered 2022-01-19: 80 ug via INTRAVENOUS
  Administered 2022-01-19: 160 ug via INTRAVENOUS

## 2022-01-19 MED ORDER — EPHEDRINE SULFATE (PRESSORS) 50 MG/ML IJ SOLN
INTRAMUSCULAR | Status: DC | PRN
  Administered 2022-01-19: 10 mg via INTRAVENOUS
  Administered 2022-01-19: 5 mg via INTRAVENOUS
  Administered 2022-01-19: 10 mg via INTRAVENOUS

## 2022-01-19 MED ORDER — BUPIVACAINE-EPINEPHRINE (PF) 0.5% -1:200000 IJ SOLN
INTRAMUSCULAR | Status: AC
  Filled 2022-01-19: qty 30

## 2022-01-19 MED ORDER — LIDOCAINE HCL (PF) 2 % IJ SOLN
INTRAMUSCULAR | Status: AC
  Filled 2022-01-19: qty 5

## 2022-01-19 MED ORDER — FENTANYL CITRATE (PF) 100 MCG/2ML IJ SOLN
INTRAMUSCULAR | Status: AC
  Administered 2022-01-19: 25 ug via INTRAVENOUS
  Filled 2022-01-19: qty 2

## 2022-01-19 MED ORDER — CEFAZOLIN SODIUM-DEXTROSE 2-4 GM/100ML-% IV SOLN
INTRAVENOUS | Status: AC
  Filled 2022-01-19: qty 100

## 2022-01-19 MED ORDER — OXYCODONE HCL 5 MG PO TABS
10.0000 mg | ORAL_TABLET | ORAL | Status: DC | PRN
  Administered 2022-01-19 – 2022-01-21 (×7): 10 mg via ORAL
  Filled 2022-01-19 (×7): qty 2

## 2022-01-19 MED ORDER — SODIUM CHLORIDE 0.9% FLUSH: 3.0000 mL | INTRAVENOUS | Status: DC | PRN

## 2022-01-19 MED ORDER — ROCURONIUM BROMIDE 10 MG/ML (PF) SYRINGE
PREFILLED_SYRINGE | INTRAVENOUS | Status: AC
  Filled 2022-01-19: qty 10

## 2022-01-19 MED ORDER — BUPIVACAINE LIPOSOME 1.3 % IJ SUSP
INTRAMUSCULAR | Status: AC
  Filled 2022-01-19: qty 20

## 2022-01-19 MED ORDER — METHOCARBAMOL 500 MG PO TABS
500.0000 mg | ORAL_TABLET | Freq: Four times a day (QID) | ORAL | Status: DC | PRN
Start: 2022-01-19 — End: 2022-01-20

## 2022-01-19 MED ORDER — ONDANSETRON HCL 4 MG/2ML IJ SOLN
INTRAMUSCULAR | Status: AC
  Filled 2022-01-19: qty 2

## 2022-01-19 MED ORDER — POLYETHYLENE GLYCOL 3350 17 G PO PACK
17.0000 g | PACK | Freq: Every day | ORAL | Status: DC | PRN
  Administered 2022-01-23: 17 g via ORAL
  Filled 2022-01-19: qty 1

## 2022-01-19 MED ORDER — ONDANSETRON HCL 4 MG PO TABS
4.0000 mg | ORAL_TABLET | Freq: Four times a day (QID) | ORAL | Status: DC | PRN
  Administered 2022-01-21 (×2): 4 mg via ORAL
  Filled 2022-01-19 (×2): qty 1

## 2022-01-19 MED ORDER — SODIUM CHLORIDE 0.9 % IV SOLN
250.0000 mL | INTRAVENOUS | Status: DC
  Administered 2022-01-19: 250 mL via INTRAVENOUS

## 2022-01-19 MED ORDER — SENNA 8.6 MG PO TABS
1.0000 | ORAL_TABLET | Freq: Two times a day (BID) | ORAL | Status: DC
  Administered 2022-01-19 – 2022-01-24 (×10): 8.6 mg via ORAL
  Filled 2022-01-19 (×11): qty 1

## 2022-01-19 MED ORDER — METHOCARBAMOL 1000 MG/10ML IJ SOLN
500.0000 mg | Freq: Four times a day (QID) | INTRAMUSCULAR | Status: DC | PRN
Start: 2022-01-19 — End: 2022-01-20
  Filled 2022-01-19: qty 5

## 2022-01-19 MED ORDER — METHYLPREDNISOLONE ACETATE 40 MG/ML IJ SUSP
INTRAMUSCULAR | Status: AC
  Filled 2022-01-19: qty 1

## 2022-01-19 MED ORDER — ROCURONIUM BROMIDE 100 MG/10ML IV SOLN
INTRAVENOUS | Status: DC | PRN
  Administered 2022-01-19: 5 mg via INTRAVENOUS

## 2022-01-19 MED ORDER — GLYCOPYRROLATE 0.2 MG/ML IJ SOLN
INTRAMUSCULAR | Status: AC
Start: 2022-01-19 — End: ?
  Filled 2022-01-19: qty 1

## 2022-01-19 MED ORDER — EPHEDRINE 5 MG/ML INJ
INTRAVENOUS | Status: AC
Start: 2022-01-19 — End: ?
  Filled 2022-01-19: qty 5

## 2022-01-19 MED ORDER — ONDANSETRON HCL 4 MG/2ML IJ SOLN: 4.0000 mg | Freq: Four times a day (QID) | INTRAMUSCULAR | Status: DC | PRN

## 2022-01-19 MED ORDER — MENTHOL 3 MG MT LOZG: 1.0000 | LOZENGE | OROMUCOSAL | Status: DC | PRN

## 2022-01-19 MED ORDER — KETOROLAC TROMETHAMINE 15 MG/ML IJ SOLN
15.0000 mg | Freq: Four times a day (QID) | INTRAMUSCULAR | Status: AC
  Administered 2022-01-19 – 2022-01-20 (×4): 15 mg via INTRAVENOUS
  Filled 2022-01-19 (×4): qty 1

## 2022-01-19 MED ORDER — SODIUM CHLORIDE FLUSH 0.9 % IV SOLN
INTRAVENOUS | Status: AC
  Filled 2022-01-19: qty 20

## 2022-01-19 MED ORDER — ALBUTEROL SULFATE (2.5 MG/3ML) 0.083% IN NEBU
3.0000 mL | INHALATION_SOLUTION | Freq: Four times a day (QID) | RESPIRATORY_TRACT | Status: DC | PRN
Start: 2022-01-19 — End: 2022-01-24

## 2022-01-19 MED ORDER — GLYCOPYRROLATE 0.2 MG/ML IJ SOLN
INTRAMUSCULAR | Status: DC | PRN
  Administered 2022-01-19: .2 mg via INTRAVENOUS

## 2022-01-19 MED ORDER — FENTANYL CITRATE (PF) 100 MCG/2ML IJ SOLN
INTRAMUSCULAR | Status: AC
  Filled 2022-01-19: qty 2

## 2022-01-19 MED ORDER — IPRATROPIUM-ALBUTEROL 20-100 MCG/ACT IN AERS
1.0000 | INHALATION_SPRAY | RESPIRATORY_TRACT | Status: DC
Start: 2022-01-20 — End: 2022-01-24
  Administered 2022-01-20 – 2022-01-24 (×5): 1 via RESPIRATORY_TRACT
  Filled 2022-01-19: qty 4

## 2022-01-19 MED ORDER — MIDAZOLAM HCL 2 MG/2ML IJ SOLN
INTRAMUSCULAR | Status: DC | PRN
  Administered 2022-01-19: 2 mg via INTRAVENOUS

## 2022-01-19 MED ORDER — AZATHIOPRINE 50 MG PO TABS
50.0000 mg | ORAL_TABLET | ORAL | Status: DC
  Administered 2022-01-20 – 2022-01-24 (×5): 50 mg via ORAL
  Filled 2022-01-19 (×5): qty 1

## 2022-01-19 MED ORDER — LACTATED RINGERS IV SOLN: INTRAVENOUS | Status: DC

## 2022-01-19 MED ORDER — SUMATRIPTAN SUCCINATE 50 MG PO TABS
100.0000 mg | ORAL_TABLET | ORAL | Status: DC | PRN
Start: 2022-01-19 — End: 2022-01-24
  Filled 2022-01-19: qty 2

## 2022-01-19 MED ORDER — PHENOL 1.4 % MT LIQD
1.0000 | OROMUCOSAL | Status: DC | PRN
Start: 2022-01-19 — End: 2022-01-24

## 2022-01-19 MED ORDER — FENTANYL CITRATE (PF) 100 MCG/2ML IJ SOLN
25.0000 ug | INTRAMUSCULAR | Status: DC | PRN
  Administered 2022-01-19: 25 ug via INTRAVENOUS
  Administered 2022-01-19: 50 ug via INTRAVENOUS

## 2022-01-19 MED ORDER — OXYCODONE HCL 5 MG/5ML PO SOLN: 5.0000 mg | Freq: Once | ORAL | Status: DC | PRN

## 2022-01-19 MED ORDER — SODIUM CHLORIDE 0.9% FLUSH
3.0000 mL | Freq: Two times a day (BID) | INTRAVENOUS | Status: DC
  Administered 2022-01-19 – 2022-01-22 (×7): 3 mL via INTRAVENOUS

## 2022-01-19 MED ORDER — METHYLPREDNISOLONE ACETATE 40 MG/ML IJ SUSP
INTRAMUSCULAR | Status: DC | PRN
  Administered 2022-01-19: 40 mg

## 2022-01-19 MED ORDER — PANTOPRAZOLE SODIUM 40 MG PO TBEC
40.0000 mg | DELAYED_RELEASE_TABLET | ORAL | Status: DC
  Administered 2022-01-20 – 2022-01-24 (×5): 40 mg via ORAL
  Filled 2022-01-19 (×5): qty 1

## 2022-01-19 MED ORDER — ACETAMINOPHEN 500 MG PO TABS
1000.0000 mg | ORAL_TABLET | Freq: Four times a day (QID) | ORAL | Status: DC
  Administered 2022-01-19 – 2022-01-24 (×17): 1000 mg via ORAL
  Filled 2022-01-19 (×20): qty 2

## 2022-01-19 MED ORDER — FIBRIN SEALANT 2 ML SINGLE DOSE KIT
PACK | CUTANEOUS | Status: DC | PRN
  Administered 2022-01-19: 2 mL via TOPICAL

## 2022-01-19 MED ORDER — ENOXAPARIN SODIUM 40 MG/0.4ML IJ SOSY
40.0000 mg | PREFILLED_SYRINGE | INTRAMUSCULAR | Status: DC
  Administered 2022-01-20 – 2022-01-24 (×5): 40 mg via SUBCUTANEOUS
  Filled 2022-01-19 (×5): qty 0.4

## 2022-01-19 MED ORDER — BUPIVACAINE-EPINEPHRINE (PF) 0.5% -1:200000 IJ SOLN
INTRAMUSCULAR | Status: DC | PRN
  Administered 2022-01-19: 7 mL

## 2022-01-19 MED ORDER — LIDOCAINE HCL (CARDIAC) PF 100 MG/5ML IV SOSY
PREFILLED_SYRINGE | INTRAVENOUS | Status: DC | PRN
  Administered 2022-01-19: 100 mg via INTRAVENOUS

## 2022-01-19 MED ORDER — VALACYCLOVIR HCL 500 MG PO TABS
1000.0000 mg | ORAL_TABLET | Freq: Every day | ORAL | Status: DC
  Administered 2022-01-19 – 2022-01-24 (×6): 1000 mg via ORAL
  Filled 2022-01-19 (×6): qty 2

## 2022-01-19 MED ORDER — SODIUM CHLORIDE (PF) 0.9 % IJ SOLN
INTRAMUSCULAR | Status: DC | PRN
  Administered 2022-01-19: 60 mL via INTRAMUSCULAR

## 2022-01-19 MED ORDER — SUCCINYLCHOLINE CHLORIDE 200 MG/10ML IV SOSY
PREFILLED_SYRINGE | INTRAVENOUS | Status: DC | PRN
  Administered 2022-01-19: 100 mg via INTRAVENOUS

## 2022-01-19 MED ORDER — LAMOTRIGINE 25 MG PO TABS
75.0000 mg | ORAL_TABLET | ORAL | Status: DC
  Administered 2022-01-20 – 2022-01-24 (×5): 75 mg via ORAL
  Filled 2022-01-19 (×5): qty 3

## 2022-01-19 MED ORDER — BISACODYL 10 MG RE SUPP: 10.0000 mg | Freq: Every day | RECTAL | Status: DC | PRN

## 2022-01-19 MED ORDER — OXYCODONE HCL 5 MG PO TABS
5.0000 mg | ORAL_TABLET | ORAL | Status: DC | PRN
  Administered 2022-01-20 – 2022-01-21 (×3): 5 mg via ORAL
  Filled 2022-01-19 (×4): qty 1

## 2022-01-19 MED ORDER — MIDAZOLAM HCL 2 MG/2ML IJ SOLN
INTRAMUSCULAR | Status: AC
  Filled 2022-01-19: qty 2

## 2022-01-19 MED ORDER — ZOLPIDEM TARTRATE 5 MG PO TABS
5.0000 mg | ORAL_TABLET | Freq: Once | ORAL | Status: AC
  Administered 2022-01-19: 5 mg via ORAL
  Filled 2022-01-19: qty 1

## 2022-01-19 MED ORDER — PROPOFOL 10 MG/ML IV BOLUS
INTRAVENOUS | Status: AC
  Filled 2022-01-19: qty 20

## 2022-01-19 MED ORDER — FENTANYL CITRATE (PF) 100 MCG/2ML IJ SOLN
INTRAMUSCULAR | Status: DC | PRN
Start: 2022-01-19 — End: 2022-01-19
  Administered 2022-01-19 (×2): 50 ug via INTRAVENOUS

## 2022-01-19 MED ORDER — PROPOFOL 10 MG/ML IV BOLUS
INTRAVENOUS | Status: DC | PRN
  Administered 2022-01-19: 200 mg via INTRAVENOUS

## 2022-01-19 MED ORDER — HYDROMORPHONE HCL 1 MG/ML IJ SOLN: 0.5000 mg | INTRAMUSCULAR | Status: DC | PRN

## 2022-01-19 MED ORDER — DOCUSATE SODIUM 100 MG PO CAPS
100.0000 mg | ORAL_CAPSULE | Freq: Two times a day (BID) | ORAL | Status: DC
Start: 2022-01-19 — End: 2022-01-24
  Administered 2022-01-19 – 2022-01-24 (×10): 100 mg via ORAL
  Filled 2022-01-19 (×11): qty 1

## 2022-01-19 MED ORDER — ACETAMINOPHEN 10 MG/ML IV SOLN
INTRAVENOUS | Status: DC | PRN
  Administered 2022-01-19: 1000 mg via INTRAVENOUS

## 2022-01-19 MED ORDER — 0.9 % SODIUM CHLORIDE (POUR BTL) OPTIME
TOPICAL | Status: DC | PRN
  Administered 2022-01-19: 500 mL

## 2022-01-19 MED ORDER — OXYCODONE HCL 5 MG PO TABS: 5.0000 mg | ORAL_TABLET | Freq: Once | ORAL | Status: DC | PRN

## 2022-01-19 MED ORDER — FERROUS SULFATE 325 (65 FE) MG PO TABS
325.0000 mg | ORAL_TABLET | Freq: Every day | ORAL | Status: DC
  Administered 2022-01-20 – 2022-01-24 (×5): 325 mg via ORAL
  Filled 2022-01-19 (×5): qty 1

## 2022-01-19 SURGICAL SUPPLY — 61 items
ADH SKN CLS APL DERMABOND .7 (GAUZE/BANDAGES/DRESSINGS) ×1
AGENT HMST KT MTR STRL THRMB (HEMOSTASIS) ×1
APL PRP STRL LF DISP 70% ISPRP (MISCELLANEOUS)
BASIN KIT SINGLE STR (MISCELLANEOUS) ×2 IMPLANT
BUR NEURO DRILL SOFT 3.0X3.8M (BURR) ×2 IMPLANT
CHLORAPREP W/TINT 26 (MISCELLANEOUS) ×1 IMPLANT
CNTNR SPEC 2.5X3XGRAD LEK (MISCELLANEOUS) ×1
CONT SPEC 4OZ STER OR WHT (MISCELLANEOUS) ×1
CONT SPEC 4OZ STRL OR WHT (MISCELLANEOUS) ×1
CONTAINER SPEC 2.5X3XGRAD LEK (MISCELLANEOUS) ×1 IMPLANT
COUNTER NEEDLE 20/40 LG (NEEDLE) ×1 IMPLANT
CUP MEDICINE 2OZ PLAST GRAD ST (MISCELLANEOUS) ×1 IMPLANT
DERMABOND ADVANCED (GAUZE/BANDAGES/DRESSINGS) ×1
DERMABOND ADVANCED .7 DNX12 (GAUZE/BANDAGES/DRESSINGS) ×1 IMPLANT
DRAPE C ARM PK CFD 31 SPINE (DRAPES) ×2 IMPLANT
DRAPE LAPAROTOMY 100X77 ABD (DRAPES) ×2 IMPLANT
DRAPE MICROSCOPE SPINE 48X150 (DRAPES) ×2 IMPLANT
DRAPE SURG 17X11 SM STRL (DRAPES) ×2 IMPLANT
DRSG OPSITE POSTOP 4X6 (GAUZE/BANDAGES/DRESSINGS) ×2 IMPLANT
ELECT CAUTERY BLADE TIP 2.5 (TIP)
ELECT EZSTD 165MM 6.5IN (MISCELLANEOUS) ×2
ELECT REM PT RETURN 9FT ADLT (ELECTROSURGICAL) ×2
ELECTRODE CAUTERY BLDE TIP 2.5 (TIP) ×1 IMPLANT
ELECTRODE EZSTD 165MM 6.5IN (MISCELLANEOUS) ×1 IMPLANT
ELECTRODE REM PT RTRN 9FT ADLT (ELECTROSURGICAL) ×1 IMPLANT
GLOVE BIOGEL PI IND STRL 6.5 (GLOVE) ×1 IMPLANT
GLOVE BIOGEL PI IND STRL 8.5 (GLOVE) ×1 IMPLANT
GLOVE BIOGEL PI INDICATOR 6.5 (GLOVE) ×1
GLOVE BIOGEL PI INDICATOR 8.5 (GLOVE) ×1
GLOVE SURG SYN 6.5 ES PF (GLOVE) ×2 IMPLANT
GLOVE SURG SYN 6.5 PF PI (GLOVE) ×1 IMPLANT
GLOVE SURG SYN 8.5  E (GLOVE) ×6
GLOVE SURG SYN 8.5 E (GLOVE) ×3 IMPLANT
GLOVE SURG SYN 8.5 PF PI (GLOVE) ×3 IMPLANT
GOWN SRG LRG LVL 4 IMPRV REINF (GOWNS) ×1 IMPLANT
GOWN SRG XL LVL 3 NONREINFORCE (GOWNS) ×1 IMPLANT
GOWN STRL NON-REIN TWL XL LVL3 (GOWNS) ×2
GOWN STRL REIN LRG LVL4 (GOWNS) ×2
GRADUATE 1200CC STRL 31836 (MISCELLANEOUS) ×1 IMPLANT
GRAFT DURAGEN MATRIX 1WX1L (Tissue) ×1 IMPLANT
KIT SPINAL PRONEVIEW (KITS) ×2 IMPLANT
MANIFOLD NEPTUNE II (INSTRUMENTS) ×2 IMPLANT
MARKER SKIN DUAL TIP RULER LAB (MISCELLANEOUS) ×3 IMPLANT
NDL SAFETY ECLIPSE 18X1.5 (NEEDLE) ×1 IMPLANT
NEEDLE HYPO 18GX1.5 SHARP (NEEDLE)
NS IRRIG 1000ML POUR BTL (IV SOLUTION) ×2 IMPLANT
PACK LAMINECTOMY NEURO (CUSTOM PROCEDURE TRAY) ×2 IMPLANT
PENCIL ELECTRO HAND CTR (MISCELLANEOUS) ×4 IMPLANT
SOLUTION IRRIG SURGIPHOR (IV SOLUTION) ×1 IMPLANT
SURGIFLO W/THROMBIN 8M KIT (HEMOSTASIS) ×2 IMPLANT
SUT DVC VLOC 3-0 CL 6 P-12 (SUTURE) ×2 IMPLANT
SUT ETHILON 3-0 FS-10 30 BLK (SUTURE) ×2
SUT VIC AB 0 CT1 27 (SUTURE) ×2
SUT VIC AB 0 CT1 27XCR 8 STRN (SUTURE) ×1 IMPLANT
SUT VIC AB 2-0 CT1 18 (SUTURE) ×2 IMPLANT
SUTURE EHLN 3-0 FS-10 30 BLK (SUTURE) IMPLANT
SYR 30ML LL (SYRINGE) ×4 IMPLANT
SYR 3ML LL SCALE MARK (SYRINGE) ×2 IMPLANT
TOWEL OR 17X26 4PK STRL BLUE (TOWEL DISPOSABLE) ×3 IMPLANT
TUBING CONNECTING 10 (TUBING) ×1 IMPLANT
WATER STERILE IRR 1000ML POUR (IV SOLUTION) ×4 IMPLANT

## 2022-01-19 NOTE — H&P (Signed)
I have reviewed and confirmed my history and physical from 01/01/22 with no additions or changes. Plan for L4-S1 decompression.  Risks and benefits reviewed.  Heart sounds normal no MRG. Chest Clear to Auscultation Bilaterally.

## 2022-01-19 NOTE — Transfer of Care (Signed)
Immediate Anesthesia Transfer of Care Note  Patient: Lisa [name]  Procedure(s) Performed: L4-S1 DECOMPRESSION (Spine Lumbar)  Patient Location: PACU  Anesthesia Type:gen  Level of Consciousness: sedated  Airway & Oxygen Therapy: Patient Spontanous Breathing and Patient connected to face mask oxygen  Post-op Assessment: Report given to RN and Post -op Vital signs reviewed and stable  Post vital signs: Reviewed  Last Vitals:  Vitals Value Taken Time  BP    Temp    Pulse 64 01/19/22 1231  Resp 12 01/19/22 1231  SpO2 100 % 01/19/22 1231  Vitals shown include unvalidated device data.  Last Pain:         Complications: No notable events documented.

## 2022-01-19 NOTE — Anesthesia Preprocedure Evaluation (Addendum)
Anesthesia Evaluation  Patient identified by MRN, date of birth, ID band Patient awake    Reviewed: Allergy & Precautions, NPO status , Patient's Chart, lab work & pertinent test results  History of Anesthesia Complications Negative for: history of anesthetic complications  Airway Mallampati: I   Neck ROM: Full    Dental no notable dental hx.    Pulmonary asthma , pneumonia (nonspecific interstitial pneumonia on 2L home O2 at night),    Pulmonary exam normal breath sounds clear to auscultation       Cardiovascular hypertension, Normal cardiovascular exam Rhythm:Regular Rate:Normal  Hx PE 2016 on warfarin, bridging with lovenox  ECG 01/07/22:  Sinus bradycardia (HR 55) Otherwise normal ECG When compared with ECG of 02-Mar-2019 23:07, No significant change was found   Neuro/Psych  Headaches, PSYCHIATRIC DISORDERS (PTSD) Anxiety Depression Bipolar Disorder Schizophrenia    GI/Hepatic GERD  ,S/p gastric bypass   Endo/Other  Obesity   Renal/GU negative Renal ROS     Musculoskeletal   Abdominal   Peds  Hematology  (+) Blood dyscrasia, anemia ,   Anesthesia Other Findings   Reproductive/Obstetrics                            Anesthesia Physical Anesthesia Plan  ASA: 3  Anesthesia Plan: General   Post-op Pain Management:    Induction: Intravenous  PONV Risk Score and Plan: 3 and Ondansetron, Dexamethasone and Treatment may vary due to age or medical condition  Airway Management Planned: Oral ETT  Additional Equipment:   Intra-op Plan:   Post-operative Plan: Extubation in OR  Informed Consent: I have reviewed the patients History and Physical, chart, labs and discussed the procedure including the risks, benefits and alternatives for the proposed anesthesia with the patient or authorized representative who has indicated his/her understanding and acceptance.     Dental advisory  given  Plan Discussed with: CRNA  Anesthesia Plan Comments: (Patient consented for risks of anesthesia including but not limited to:  - adverse reactions to medications - damage to eyes, teeth, lips or other oral mucosa - nerve damage due to positioning  - sore throat or hoarseness - damage to heart, brain, nerves, lungs, other parts of body or loss of life  Informed patient about role of CRNA in peri- and intra-operative care.  Patient voiced understanding.)        Anesthesia Quick Evaluation

## 2022-01-19 NOTE — Op Note (Addendum)
Indications: Ms. Althia Forts presented with neurogenic claudication. She failed conservative management prompting surgical intervention.  Findings: severe stenosis  Preoperative Diagnosis: Lumbar Stenosis with neurogenic claudication Postoperative Diagnosis: same   EBL: 50 ml IVF: 750 ml Drains: none Disposition: Extubated and Stable to PACU Complications: none  No foley catheter was placed.   Preoperative Note:   Risks of surgery discussed include: infection, bleeding, stroke, coma, death, paralysis, CSF leak, nerve/spinal cord injury, numbness, tingling, weakness, complex regional pain syndrome, recurrent stenosis and/or disc herniation, vascular injury, development of instability, neck/back pain, need for further surgery, persistent symptoms, development of deformity, and the risks of anesthesia. The patient understood these risks and agreed to proceed.  Operative Note:   1. L4-S1 lumbar decompression including central laminectomy and bilateral medial facetectomies including foraminotomies  The patient was then brought from the preoperative center with intravenous access established.  The patient underwent general anesthesia and endotracheal tube intubation, and was then rotated on the [place] rail top where all pressure points were appropriately padded.  The skin was then thoroughly cleansed.  Perioperative antibiotic prophylaxis was administered.  Sterile prep and drapes were then applied and a timeout was then observed.  C-arm was brought into the field under sterile conditions and under lateral visualization the L5-S1 interspace was identified and marked.  The incision was marked on the right and injected with local anesthetic. Once this was complete a 4 cm incision was opened with the use of a #10 blade knife.    The metrx tubes were sequentially advanced and confirmed in position at L5-S1. An 21m by 656mtube was locked in place to the bed side attachment.  The microscope  was then sterilely brought into the field and muscle creep was hemostased with a bipolar and resected with a pituitary rongeur.  A Bovie extender was then used to expose the spinous process and lamina.  Careful attention was placed to not violate the facet capsule. A 3 mm matchstick drill bit was then used to make a hemi-laminotomy trough until the ligamentum flavum was exposed.  This was extended to the base of the spinous process and to the contralateral side to remove all the central bone from each side.  Once this was complete and the underlying ligamentum flavum was visualized, it was dissected with a curette and resected with Kerrison rongeurs.  Extensive ligamentum hypertrophy was noted, requiring a substantial amount of time and care for removal.  The dura was identified and palpated. The kerrison rongeur was then used to remove the medial facet bilaterally until no compression was noted.  A balltip probe was used to confirm decompression of the ipsilateral S1 nerve root.  Additional attention was paid to completion of the contralateral L5-S1 foraminotomy until the contralateral traversing nerve root was completely free.  Once this was complete, L5-S1 central decompression including medial facetectomy and foraminotomy was confirmed and decompression on both sides was confirmed. No CSF leak was noted.  A Depo-Medrol soaked Gelfoam pledget was placed in the defect.  The wound was copiously irrigated. The tube system was then removed under microscopic visualization and hemostasis was obtained with a bipolar.    After performing the decompression at L5-S1, the metrx tubes were sequentially advanced and confirmed in position at L4-5. An 1863my [id number] was locked in place to the bed side attachment.  Fluoroscopy was then removed from the field.  The microscope was then sterilely brought into the field and muscle creep was hemostased with a bipolar and  resected with a pituitary rongeur.  A Bovie extender  was then used to expose the spinous process and lamina.  Careful attention was placed to not violate the facet capsule. A 3 mm matchstick drill bit was then used to make a hemi-laminotomy trough until the ligamentum flavum was exposed.  This was extended to the base of the spinous process and to the contralateral side to remove all the central bone from each side.  Once this was complete and the underlying ligamentum flavum was visualized, it was dissected with a curette and resected with Kerrison rongeurs.  Extensive ligamentum hypertrophy was noted, requiring a substantial amount of time and care for removal.  The dura was identified and palpated. The kerrison rongeur was then used to remove the medial facet bilaterally until no compression was noted.  A balltip probe was used to confirm decompression of the ipsilateral L5 nerve root.  Additional attention was paid to completion of the contralateral foraminotomy until the contralateral L5 nerve root was completely free.  Once this was complete, L4-5 central decompression including medial facetectomy and foraminotomy was confirmed and decompression on both sides was confirmed. During decompression, a durotomy was noted on the right side above the L5 nerve root.  This was due to extensive scarring of the ligamentum flavum.  To augment dural closure, duragen and vistaseel were placed.   The wound was copiously irrigated. The tube system was then removed under microscopic visualization and hemostasis was obtained with a bipolar.     The fascial layer was reapproximated with the use of a 0 Vicryl suture.  Subcutaneous tissue layer was reapproximated using 2-0 Vicryl suture.  3-0 nylon was placed on the skin.  Patient was then rotated back to the preoperative bed awakened from anesthesia and taken to recovery all counts are correct in this case.  I performed the entire procedure with the assistance of Cooper Render PA as an Pensions consultant. An Civil engineer, contracting was required for this procedure to assist while dissecting the soft tissues and manipulating the neural elements, and due to the complexity of the case.   Ariz Terrones K. Izora Ribas MD

## 2022-01-19 NOTE — Anesthesia Postprocedure Evaluation (Signed)
Anesthesia Post Note  Patient: Lisa [name]  Procedure(s) Performed: L4-S1 DECOMPRESSION (Spine Lumbar)  Patient location during evaluation: PACU Anesthesia Type: General Level of consciousness: awake and alert, oriented and patient cooperative Pain management: pain level controlled Vital Signs Assessment: post-procedure vital signs reviewed and stable Respiratory status: spontaneous breathing, nonlabored ventilation and respiratory function stable Cardiovascular status: blood pressure returned to baseline and stable Postop Assessment: adequate PO intake Anesthetic complications: no   No notable events documented.   Last Vitals:  Vitals:   01/19/22 1235 01/19/22 1240  BP:    Pulse: 62 60  Resp: 14 13  Temp:    SpO2: 100% 100%    Last Pain:  Vitals:   01/19/22 1230  TempSrc:   PainSc: Asleep                 Darrin Nipper

## 2022-01-19 NOTE — Anesthesia Procedure Notes (Signed)
Procedure Name: Intubation Date/Time: 01/19/2022 10:18 AM  Performed by: Rolla Plate, CRNAPre-anesthesia Checklist: Patient identified, Patient being monitored, Timeout performed, Emergency Drugs available and Suction available Patient Re-evaluated:Patient Re-evaluated prior to induction Oxygen Delivery Method: Circle system utilized Preoxygenation: Pre-oxygenation with 100% oxygen Induction Type: IV induction Ventilation: Mask ventilation without difficulty Laryngoscope Size: 3 and McGraph Grade View: Grade I Tube type: Oral Tube size: 7.0 mm Number of attempts: 1 Airway Equipment and Method: Stylet and Video-laryngoscopy Placement Confirmation: ETT inserted through vocal cords under direct vision, positive ETCO2 and breath sounds checked- equal and bilateral Secured at: 21 cm Tube secured with: Tape Dental Injury: Teeth and Oropharynx as per pre-operative assessment

## 2022-01-20 ENCOUNTER — Encounter: Payer: Self-pay | Admitting: Neurosurgery

## 2022-01-20 MED ORDER — METHOCARBAMOL 1000 MG/10ML IJ SOLN
500.0000 mg | Freq: Four times a day (QID) | INTRAVENOUS | Status: DC
  Filled 2022-01-20: qty 5

## 2022-01-20 MED ORDER — METHOCARBAMOL 500 MG PO TABS
500.0000 mg | ORAL_TABLET | Freq: Four times a day (QID) | ORAL | Status: DC
  Administered 2022-01-20 – 2022-01-24 (×17): 500 mg via ORAL
  Filled 2022-01-20 (×17): qty 1

## 2022-01-20 MED ORDER — ZOLPIDEM TARTRATE 5 MG PO TABS
5.0000 mg | ORAL_TABLET | Freq: Once | ORAL | Status: AC
Start: 2022-01-20 — End: 2022-01-20
  Administered 2022-01-20: 5 mg via ORAL
  Filled 2022-01-20: qty 1

## 2022-01-20 NOTE — TOC Progression Note (Signed)
Transition of Care [facility]) - Progression Note    Patient Details  Name: Lisa [name] MRN: [id number] Date of Birth: 1970-12-26  Transition of Care [facility]) CM/SW [place], RN Phone Number: 01/20/2022, 8:50 AM  Clinical Narrative:    Patient is open with [facility] for [facility] services        Expected Discharge Plan and Services                                                 Social Determinants of Health (SDOH) Interventions    Readmission Risk Interventions     No data to display

## 2022-01-20 NOTE — Evaluation (Addendum)
Physical Therapy Evaluation Patient Details Name: Lisa [name] MRN: [id number] DOB: 1970/08/27 Today's Date: 01/20/2022  History of Present Illness  Pt is a 51 y.o. female s/p L4-S1 decompression (including central laminectomy and B medial facetectomies including foraminotomis) secondary to lumbar stenosis with neurogenic claudication.  PMH includes asthma, PNA, htn, HA's, anxiety, bipolar disorder, schizophrenia, anemia, blood dyscrasia.  Clinical Impression  Prior to surgery, pt was independent with functional mobility; lives with her son and fiance in a 2 level townhouse (bedroom on 2nd floor).  During session pt SBA to CGA with bed mobility via logrolling; CGA with transfers using RW; and CGA to ambulate 60 feet with RW use (narrow BOS noted).  Limited distance ambulating d/t low back pain, L LE pain, and c/o lightheadedness with all standing/ambulating activities (lightheadedness symptoms resolved with pt reclined in recliner with LE's elevated).  Pt's BP 115/74 reclined in recliner with LE's elevated; BP 108/65 in standing.  Pt's low back and L LE pain 7/10 at rest beginning of session; pain increased to 10/10 with activity/ambulation; and pain 9/10 at rest end of session in bed (nurse notified of pt's request for pain meds and pt's pain status).  Pt would benefit from skilled PT to address noted impairments and functional limitations (see below for any additional details).  Upon hospital discharge, pt would benefit from [place] and support at home.    Recommendations for follow up therapy are one component of a multi-disciplinary discharge planning process, led by the attending physician.  Recommendations may be updated based on patient status, additional functional criteria and insurance authorization.  Follow Up Recommendations Home health PT      Assistance Recommended at Discharge Intermittent Supervision/Assistance  Patient can return home with the following  A little help with  walking and/or transfers;A little help with bathing/dressing/bathroom;Assistance with cooking/housework;Assist for transportation;Help with stairs or ramp for entrance    Equipment Recommendations Rolling walker (2 wheels);BSC/3in1  Recommendations for Other Services       Functional Status Assessment Patient has had a recent decline in their functional status and demonstrates the ability to make significant improvements in function in a reasonable and predictable amount of time.     Precautions / Restrictions Precautions Precautions: Back;Fall Precaution Comments: No brace needed. Restrictions Weight Bearing Restrictions: No      Mobility  Bed Mobility Overal bed mobility: Needs Assistance Bed Mobility: Rolling, Sidelying to Sit, Sit to Sidelying Rolling: Supervision Sidelying to sit: Min guard     Sit to sidelying: Min guard General bed mobility comments: vc's for logrolling technique and spinal precautions (increased cueing/assist required sit to semi-supine in bed via logrolling)    Transfers Overall transfer level: Needs assistance Equipment used: Rolling walker (2 wheels) Transfers: Sit to/from Stand, Bed to chair/wheelchair/BSC Sit to Stand: Min guard Stand pivot transfers: Min guard Step pivot transfers: Min guard       General transfer comment: vc's for UE/LE placement; increased effort to stand and control descent sitting    Ambulation/Gait Ambulation/Gait assistance: Min guard Gait Distance (Feet): 60 Feet Assistive device: Rolling walker (2 wheels)   Gait velocity: decreased     General Gait Details: partial step through gait pattern; steady with RW use; pt intermittently closing her eyes d/t pain; narrow BOS  Stairs            Wheelchair Mobility    Modified Rankin (Stroke Patients Only)       Balance Overall balance assessment: Needs assistance Sitting-balance support: No upper extremity  supported, Feet supported Sitting  balance-Leahy Scale: Good Sitting balance - Comments: steady sitting reaching within BOS   Standing balance support: Bilateral upper extremity supported, During functional activity, Reliant on assistive device for balance Standing balance-Leahy Scale: Good Standing balance comment: steady ambulating with RW use                             Pertinent Vitals/Pain Pain Assessment Pain Assessment: 0-10 Pain Score: 9  Pain Location: back and down L LE Pain Descriptors / Indicators: Guarding, Constant, Discomfort, Grimacing, Operative site guarding Pain Intervention(s): Limited activity within patient's tolerance, Monitored during session, Premedicated before session, Repositioned, Patient requesting pain meds-RN notified HR and O2 on room air stable and WFL throughout treatment session.    Home Living Family/patient expects to be discharged to:: Private residence Living Arrangements: Children;Non-relatives/Friends (son and fiance) Available Help at Discharge: Family;Available 24 hours/day Type of Home: Apartment (Townhouse) Home Access: Stairs to enter Entrance Stairs-Rails: None Entrance Stairs-Number of Steps: 2 Alternate Level Stairs-Number of Steps: 15 Home Layout: Two level Home Equipment: None      Prior Function Prior Level of Function : Independent/Modified Independent;Driving             Mobility Comments: No AD for mobility; uses HHA from family/friends d/t back pain.  No recent falls. ADLs Comments: Independent with ADLs     Hand Dominance   Dominant Hand: Right    Extremity/Trunk Assessment   Upper Extremity Assessment Upper Extremity Assessment: Overall WFL for tasks assessed    Lower Extremity Assessment Lower Extremity Assessment: Generalized weakness    Cervical / Trunk Assessment Cervical / Trunk Assessment: Normal  Communication   Communication: No difficulties  Cognition Arousal/Alertness: Awake/alert Behavior During Therapy: WFL  for tasks assessed/performed Overall Cognitive Status: Within Functional Limits for tasks assessed                                          General Comments General comments (skin integrity, edema, etc.): mild drainage noted honeycomb dressing posterior low back.  Nursing cleared pt for participation in physical therapy.  Pt agreeable to PT session.    Exercises  Education on spinal precautions; also education on bed mobility, transfers, and ambulation technique.   Assessment/Plan    PT Assessment Patient needs continued PT services  PT Problem List Decreased strength;Decreased activity tolerance;Decreased balance;Decreased mobility;Decreased knowledge of use of DME;Decreased knowledge of precautions;Pain;Decreased skin integrity       PT Treatment Interventions DME instruction;Gait training;Stair training;Functional mobility training;Therapeutic activities;Therapeutic exercise;Balance training;Patient/family education    PT Goals (Current goals can be found in the Care Plan section)  Acute Rehab PT Goals Patient Stated Goal: to improve pain and mobility PT Goal Formulation: With patient Time For Goal Achievement: 02/03/22 Potential to Achieve Goals: Good    Frequency BID     Co-evaluation               AM-PAC PT "6 Clicks" Mobility  Outcome Measure Help needed turning from your back to your side while in a flat bed without using bedrails?: A Little Help needed moving from lying on your back to sitting on the side of a flat bed without using bedrails?: A Little Help needed moving to and from a bed to a chair (including a wheelchair)?: A Little Help needed standing up from a  chair using your arms (e.g., wheelchair or bedside chair)?: A Little Help needed to walk in hospital room?: A Little Help needed climbing 3-5 steps with a railing? : A Little 6 Click Score: 18    End of Session Equipment Utilized During Treatment: Gait belt (belt up high away from  incision) Activity Tolerance: Patient limited by pain Patient left: in bed;with call bell/phone within reach;with bed alarm set;with family/visitor present Nurse Communication: Mobility status;Precautions;Patient requests pain meds PT Visit Diagnosis: Other abnormalities of gait and mobility (R26.89);Muscle weakness (generalized) (M62.81);Pain Pain - Right/Left: Left Pain - part of body: Leg    Time: 9924-2683 PT Time Calculation (min) (ACUTE ONLY): 36 min   Charges:   PT Evaluation $PT Eval Low Complexity: 1 Low PT Treatments $Therapeutic Activity: 8-22 mins       Leitha Bleak, PT 01/20/22, 3:56 PM  Leitha Bleak, PT 01/20/22, 5:03 PM

## 2022-01-20 NOTE — Evaluation (Addendum)
Occupational Therapy Evaluation Patient Details Name: Lisa [name] MRN: [id number] DOB: 28-Jul-1970 Today's Date: 01/20/2022   History of Present Illness Pt is a 51 y.o s/p L4-S1 decompression on 01/19/22. PMH: DVT, HTN, asthma, and anemia.   Clinical Impression   Pt was seen for OT evaluation this date. Prior to hospital admission, pt was using no AD for mobility, but HHA from family 2/2 pain and independent with ADLs. Pt lives with son and fiance in a two level apartment with 2 steps to enter. Pt reports family is able to provide 24/7 assistance if needed. Pt presents to acute OT demonstrating impaired ADL performance and functional mobility 2/2 decreased activity tolerance and functional strength/ROM/balance deficits. Pt able to recall 2/3 spinal pcns. Pt reported 7/10 pain in back and L LE.   Pt currently requires MIN A for donning/doffing socks with reacher and sock aide while seated. CGA, with no AD, min vcs for safe hand placement and to maintaining spinal pcns for STS and stand pivot ADL t/f - pt reported unsteadiness upon standing, but no observed LOB. Mobility limited by pain at this time. CGA with min vcs for sequencing for log roll supine<>sit. Pt educated on spinal pcns, adaptive equipment, and safe ADL participation - provided handout. All questions from pt answered within scope of practice. Pt would benefit from skilled OT to address noted impairments and functional limitations (see below for any additional details). Upon hospital discharge, recommend no OT follow up.      Recommendations for follow up therapy are one component of a multi-disciplinary discharge planning process, led by the attending physician.  Recommendations may be updated based on patient status, additional functional criteria and insurance authorization.   Follow Up Recommendations  No OT follow up    Assistance Recommended at Discharge Set up Supervision/Assistance  Patient can return home with the  following A little help with walking and/or transfers;A lot of help with bathing/dressing/bathroom;Assistance with cooking/housework;Assist for transportation    Functional Status Assessment  Patient has had a recent decline in their functional status and demonstrates the ability to make significant improvements in function in a reasonable and predictable amount of time.  Equipment Recommendations  BSC/3in1    Recommendations for Other Services       Precautions / Restrictions Precautions Precautions: Back;Fall Restrictions Weight Bearing Restrictions: No      Mobility Bed Mobility Overal bed mobility: Needs Assistance Bed Mobility: Sidelying to Sit, Sit to Sidelying   Sidelying to sit: Min guard     Sit to sidelying: Min guard      Transfers Overall transfer level: Needs assistance Equipment used: None Transfers: Sit to/from Stand, Bed to chair/wheelchair/BSC Sit to Stand: Min guard Stand pivot transfers: Min guard                Balance Overall balance assessment: Needs assistance Sitting-balance support: No upper extremity supported, Feet supported Sitting balance-Leahy Scale: Good     Standing balance support: During functional activity, No upper extremity supported Standing balance-Leahy Scale: Fair                             ADL either performed or assessed with clinical judgement   ADL Overall ADL's : Needs assistance/impaired  General ADL Comments: MIN A for donning/doffing socks with reacher and sock aide while seated. CGA, with no AD, min vcs for safe hand placement and to maintaining spinal pcns for stand pivot ADL t/f.      Pertinent Vitals/Pain Pain Assessment Pain Assessment: 0-10 Pain Score: 7  Pain Location: Back and L LE Pain Descriptors / Indicators: Grimacing, Guarding, Discomfort, Sore Pain Intervention(s): Limited activity within patient's tolerance, Monitored  during session, Repositioned     Hand Dominance     Extremity/Trunk Assessment Upper Extremity Assessment Upper Extremity Assessment: Overall WFL for tasks assessed   Lower Extremity Assessment Lower Extremity Assessment: Overall WFL for tasks assessed       Communication Communication Communication: No difficulties   Cognition Arousal/Alertness: Awake/alert Behavior During Therapy: WFL for tasks assessed/performed Overall Cognitive Status: Within Functional Limits for tasks assessed                                                  Home Living Family/patient expects to be discharged to:: Private residence Living Arrangements: Children;Non-relatives/Friends (son and fiance) Available Help at Discharge: Family;Available 24 hours/day Type of Home: Apartment Home Access: Stairs to enter Entrance Stairs-Number of Steps: 2 Entrance Stairs-Rails: None Home Layout: Two level Alternate Level Stairs-Number of Steps: 15 Alternate Level Stairs-Rails: Right Bathroom Shower/Tub: Tub/shower unit (pt stated there is a half bath downstairs)   Bathroom Toilet: Standard     Home Equipment: None          Prior Functioning/Environment Prior Level of Function : Independent/Modified Independent;Driving             Mobility Comments: no AD for mobility, but pt reported they used HHA from family/friends 2/2 back pain. ADLs Comments: Independent with ADLs        OT Problem List: Decreased strength;Decreased range of motion;Decreased activity tolerance;Impaired balance (sitting and/or standing)      OT Treatment/Interventions: Self-care/ADL training;Therapeutic exercise;DME and/or AE instruction;Energy conservation;Therapeutic activities;Patient/family education;Balance training    OT Goals(Current goals can be found in the care plan section) Acute Rehab OT Goals Patient Stated Goal: to go home OT Goal Formulation: With patient Time For Goal Achievement:  02/03/22 Potential to Achieve Goals: Good ADL Goals Pt Will Perform Grooming: with modified independence;standing Pt Will Perform Lower Body Dressing: with modified independence;with adaptive equipment;sit to/from stand Pt Will Transfer to Toilet: with modified independence;regular height toilet;ambulating (with raised height and LRAD)  OT Frequency: Min 2X/week       AM-PAC OT "6 Clicks" Daily Activity     Outcome Measure Help from another person eating meals?: None Help from another person taking care of personal grooming?: A Little Help from another person toileting, which includes using toliet, bedpan, or urinal?: A Little Help from another person bathing (including washing, rinsing, drying)?: A Lot Help from another person to put on and taking off regular upper body clothing?: A Little Help from another person to put on and taking off regular lower body clothing?: A Lot 6 Click Score: 17   End of Session Equipment Utilized During Treatment: Other (comment) (none) Nurse Communication: Other (comment) (Pt reported pain in L LE)  Activity Tolerance: Patient tolerated treatment well Patient left: in bed;with call bell/phone within reach;with bed alarm set;with SCD's reapplied  OT Visit Diagnosis: Muscle weakness (generalized) (M62.81)  Time: 1007-1219 OT Time Calculation (min): 26 min Charges:  OT General Charges $OT Visit: 1 Visit OT Evaluation $OT Eval Low Complexity: 1 Low OT Treatments $Self Care/Home Management : 8-22 mins  [employer], OTDS  [employer] 01/20/2022, 12:56 PM

## 2022-01-20 NOTE — Progress Notes (Signed)
    Attending Progress Note  History: Sarayah Bacchi is a 51 y.o status post L4-S1 decompression.  POD1: She complains of back pain and nonpositional headache this morning. She did have some leg pain overnight but seems to be improved from pre-op  Physical Exam: Vitals:   01/20/22 0731 01/20/22 0812  BP:  103/66  Pulse:  (!) 53  Resp:  16  Temp:  97.9 F (36.6 C)  SpO2: 97% 100%    AA Ox3 CNI  Strength:3/5 throughout BLE 2/2 to effort.  Incision with lightly blood stained dressing in place  Data:  No results for input(s): "NA", "K", "CL", "CO2", "BUN", "CREATININE", "LABGLOM", "GLUCOSE", "CALCIUM" in the last 168 hours. No results for input(s): "AST", "ALT", "ALKPHOS" in the last 168 hours.  Invalid input(s): "TBILI"   No results for input(s): "WBC", "HGB", "HCT", "PLT" in the last 168 hours. Recent Labs  Lab 01/19/22 0905  INR 1.1         Other tests/results: none   Assessment/Plan:  Cali Hope is a 51 year old status post L4-S1 decompression on 01/20/22   - mobilize - pain control - DVT prophylaxis - PTOT; plan for [facility] at d/c  Cooper Render PA-C Department of Neurosurgery

## 2022-01-20 NOTE — Plan of Care (Signed)

## 2022-01-21 DIAGNOSIS — Z8051 Family history of malignant neoplasm of kidney: Secondary | ICD-10-CM | POA: Diagnosis not present

## 2022-01-21 DIAGNOSIS — Z803 Family history of malignant neoplasm of breast: Secondary | ICD-10-CM | POA: Diagnosis not present

## 2022-01-21 DIAGNOSIS — Z825 Family history of asthma and other chronic lower respiratory diseases: Secondary | ICD-10-CM | POA: Diagnosis not present

## 2022-01-21 DIAGNOSIS — Z811 Family history of alcohol abuse and dependence: Secondary | ICD-10-CM | POA: Diagnosis not present

## 2022-01-21 DIAGNOSIS — Z801 Family history of malignant neoplasm of trachea, bronchus and lung: Secondary | ICD-10-CM | POA: Diagnosis not present

## 2022-01-21 DIAGNOSIS — G4733 Obstructive sleep apnea (adult) (pediatric): Secondary | ICD-10-CM | POA: Diagnosis present

## 2022-01-21 DIAGNOSIS — Z86718 Personal history of other venous thrombosis and embolism: Secondary | ICD-10-CM | POA: Diagnosis not present

## 2022-01-21 DIAGNOSIS — K219 Gastro-esophageal reflux disease without esophagitis: Secondary | ICD-10-CM | POA: Diagnosis present

## 2022-01-21 DIAGNOSIS — J45909 Unspecified asthma, uncomplicated: Secondary | ICD-10-CM | POA: Diagnosis present

## 2022-01-21 DIAGNOSIS — Z7951 Long term (current) use of inhaled steroids: Secondary | ICD-10-CM | POA: Diagnosis not present

## 2022-01-21 DIAGNOSIS — I10 Essential (primary) hypertension: Secondary | ICD-10-CM | POA: Diagnosis present

## 2022-01-21 DIAGNOSIS — Z7901 Long term (current) use of anticoagulants: Secondary | ICD-10-CM | POA: Diagnosis not present

## 2022-01-21 DIAGNOSIS — G9741 Accidental puncture or laceration of dura during a procedure: Secondary | ICD-10-CM | POA: Diagnosis not present

## 2022-01-21 DIAGNOSIS — G43909 Migraine, unspecified, not intractable, without status migrainosus: Secondary | ICD-10-CM | POA: Diagnosis present

## 2022-01-21 DIAGNOSIS — M5416 Radiculopathy, lumbar region: Secondary | ICD-10-CM | POA: Diagnosis present

## 2022-01-21 DIAGNOSIS — M48062 Spinal stenosis, lumbar region with neurogenic claudication: Secondary | ICD-10-CM | POA: Diagnosis present

## 2022-01-21 DIAGNOSIS — Z823 Family history of stroke: Secondary | ICD-10-CM | POA: Diagnosis not present

## 2022-01-21 DIAGNOSIS — Z79899 Other long term (current) drug therapy: Secondary | ICD-10-CM | POA: Diagnosis not present

## 2022-01-21 DIAGNOSIS — E119 Type 2 diabetes mellitus without complications: Secondary | ICD-10-CM | POA: Diagnosis present

## 2022-01-21 DIAGNOSIS — Z833 Family history of diabetes mellitus: Secondary | ICD-10-CM | POA: Diagnosis not present

## 2022-01-21 DIAGNOSIS — Z800 Family history of malignant neoplasm of digestive organs: Secondary | ICD-10-CM | POA: Diagnosis not present

## 2022-01-21 DIAGNOSIS — Z8249 Family history of ischemic heart disease and other diseases of the circulatory system: Secondary | ICD-10-CM | POA: Diagnosis not present

## 2022-01-21 MED ORDER — GABAPENTIN 300 MG PO CAPS
300.0000 mg | ORAL_CAPSULE | Freq: Three times a day (TID) | ORAL | Status: DC
  Administered 2022-01-21 – 2022-01-24 (×8): 300 mg via ORAL
  Filled 2022-01-21 (×8): qty 1

## 2022-01-21 MED ORDER — DEXAMETHASONE SODIUM PHOSPHATE 4 MG/ML IJ SOLN
4.0000 mg | Freq: Four times a day (QID) | INTRAMUSCULAR | Status: DC
  Administered 2022-01-21: 4 mg via INTRAVENOUS
  Filled 2022-01-21 (×3): qty 1

## 2022-01-21 MED ORDER — OXYCODONE HCL 5 MG PO TABS
10.0000 mg | ORAL_TABLET | ORAL | Status: DC | PRN
  Administered 2022-01-21 – 2022-01-23 (×7): 10 mg via ORAL
  Filled 2022-01-21 (×6): qty 2

## 2022-01-21 MED ORDER — BUTALBITAL-APAP-CAFFEINE 50-325-40 MG PO TABS: 1.0000 | ORAL_TABLET | Freq: Four times a day (QID) | ORAL | Status: DC | PRN

## 2022-01-21 MED ORDER — NAPROXEN 500 MG PO TABS
500.0000 mg | ORAL_TABLET | Freq: Two times a day (BID) | ORAL | Status: DC
  Administered 2022-01-21 (×2): 500 mg via ORAL
  Filled 2022-01-21 (×2): qty 1

## 2022-01-21 MED ORDER — SODIUM CHLORIDE 0.9 % IV BOLUS
500.0000 mL | Freq: Once | INTRAVENOUS | Status: AC
  Administered 2022-01-21: 500 mL via INTRAVENOUS

## 2022-01-21 MED ORDER — DEXAMETHASONE 4 MG PO TABS
4.0000 mg | ORAL_TABLET | Freq: Four times a day (QID) | ORAL | Status: DC
  Administered 2022-01-21 – 2022-01-24 (×11): 4 mg via ORAL
  Filled 2022-01-21 (×11): qty 1

## 2022-01-21 MED ORDER — PROCHLORPERAZINE MALEATE 5 MG PO TABS: 5.0000 mg | ORAL_TABLET | Freq: Four times a day (QID) | ORAL | Status: DC | PRN

## 2022-01-21 MED ORDER — OXYCODONE HCL 5 MG PO TABS
5.0000 mg | ORAL_TABLET | ORAL | Status: DC | PRN
  Administered 2022-01-22 – 2022-01-24 (×5): 5 mg via ORAL
  Filled 2022-01-21 (×8): qty 1

## 2022-01-21 NOTE — Progress Notes (Signed)
I went in room to talk to patient regarding her fall. She was video chatting with her daughters. I notified Lisa [name] and Geologist, engineering at that time of her falling. They didn't have any further questions. Pt complained of right leg pain. I notified MD

## 2022-01-21 NOTE — Progress Notes (Cosign Needed)
Patient is not able to walk the distance required to go the bathroom, or he/she is unable to safely negotiate stairs required to access the bathroom.  A 3in1 BSC will alleviate this problem  

## 2022-01-21 NOTE — Progress Notes (Signed)
Met with the patient at the bedside She is set up with [facility] for [facility] Her mother will provide transportation She needs a rw and a 3 n1, [employer] will deliver to the room

## 2022-01-21 NOTE — Progress Notes (Signed)
Notifed Dr Izora Ribas pt fell on her knees and is complaining of right leg pain.

## 2022-01-21 NOTE — Progress Notes (Signed)
Physical Therapy Treatment Patient Details Name: Lisa [name] MRN: [id number] DOB: 12-21-1970 Today's Date: 01/21/2022   History of Present Illness Pt is a 51 y.o. female s/p L4-S1 decompression (including central laminectomy and B medial facetectomies including foraminotomis) secondary to lumbar stenosis with neurogenic claudication.  PMH includes asthma, PNA, htn, HA's, anxiety, bipolar disorder, schizophrenia, anemia, blood dyscrasia.    PT Comments    Pt agreeable to PT treatment this date. Pt stated she continues to experience constant lightheadedness, and pain in back at rest 7/10. Orthostatic BP assessed: Supine 103/64 HR 86, Sitting 109/63 HR 90, Standing 103/61 HR 92. Pt unable to stand for 3 minutes due to lightheadness/feeling hot BP taken upon sitting 99/70. Pt agreeable to ambulate in the hallway this date completing 60 feet before a seated rest break and then an additional 40 feet with RW and CGA. BP taken in standing after 2nd bout of ambulation of 40 feet 105/79 HR 104. Pt also reported lightheadedness symptoms at rest 7/10, seated rest break after ambulating 60 feet 8/10, and seated rest break after ambulating an additional 40 feet 9/10. Plan to assess stairs during PM session if pt is safe to do so, as pt bedroom/full bath is on the second level of her home with a flight of 15 stairs to ascend. Pt stated she does have a half bath and couch on the first floor of her townhouse. Continue to recommend [facility] PT at discharge.    Recommendations for follow up therapy are one component of a multi-disciplinary discharge planning process, led by the attending physician.  Recommendations may be updated based on patient status, additional functional criteria and insurance authorization.  Follow Up Recommendations  Home health PT     Assistance Recommended at Discharge Intermittent Supervision/Assistance  Patient can return home with the following A little help with walking and/or  transfers;A little help with bathing/dressing/bathroom;Assistance with cooking/housework;Assist for transportation;Help with stairs or ramp for entrance   Equipment Recommendations  Rolling walker (2 wheels);BSC/3in1    Recommendations for Other Services       Precautions / Restrictions Precautions Precautions: Back;Fall Precaution Comments: No brace needed. Restrictions Weight Bearing Restrictions: No     Mobility  Bed Mobility Overal bed mobility: Needs Assistance Bed Mobility: Rolling, Sidelying to Sit Rolling: Supervision Sidelying to sit: Supervision       General bed mobility comments: Pt transferred from supine with HOB elevated to sitting EOB with supervision.    Transfers Overall transfer level: Needs assistance Equipment used: Rolling walker (2 wheels) Transfers: Sit to/from Stand Sit to Stand: Min guard           General transfer comment: increased time/effort to complete; pt utilized good technique to stand and required verbal cues for maintaining precautions and utilizing core musculature to sit    Ambulation/Gait Ambulation/Gait assistance: Min guard Gait Distance (Feet): 100 Feet Assistive device: Rolling walker (2 wheels) Gait Pattern/deviations: Step-through pattern, Narrow base of support, Decreased step length - right, Decreased step length - left, Decreased stride length Gait velocity: decreased     General Gait Details: Steady gait pattern with RW; pt maintains NBOS. However, pt able to state the verbal cues from yesterday's session including: wider stance and appropriate walker distance. Pt continues to close eyes d/t pain  stating that it's how she manages the pain and concentrates. Pt able to maintain conversation during ambulation. Experienced constant lightheadedness.   Stairs             Emergency planning/management officer  Modified Rankin (Stroke Patients Only)       Balance Overall balance assessment: Needs assistance Sitting-balance  support: No upper extremity supported, Feet supported Sitting balance-Leahy Scale: Good Sitting balance - Comments: steady sitting EOB without UE support, able to reach for RW, no LOB   Standing balance support: Bilateral upper extremity supported, During functional activity, Reliant on assistive device for balance Standing balance-Leahy Scale: Good Standing balance comment: steady ambulating with RW; static standing balance at bedside with 1 UE supported while BP taken, no LOB                            Cognition Arousal/Alertness: Awake/alert Behavior During Therapy: WFL for tasks assessed/performed Overall Cognitive Status: Within Functional Limits for tasks assessed                                 General Comments: Pt able to state all back precautions accurately at start of session.        Exercises      General Comments General comments (skin integrity, edema, etc.): Orthostatic Blood Pressure Assessment: Supine 103/64 HR 86, Sitting 109/63 HR 90, Standing 103/61 HR 92, pt unable to stand for 3 minutes due to lightheadness/feeling hot BP taken upon sitting 99/70, BP taken in standing after ambulation of 40 feet after a seated rest break 105/79 HR 104. Pt also reported lightheadedness symptoms at rest 7/10, seated rest break after ambulating 60 feet 8/10, and seated rest break after ambulating an additional 40 feet 9/10.      Pertinent Vitals/Pain Pain Assessment Pain Assessment: 0-10 Pain Score: 7  (7/10 at rest at start of session, 7/10 standing at bedside) Pain Location: back and down L LE Pain Descriptors / Indicators: Guarding, Constant, Discomfort, Grimacing, Operative site guarding Pain Intervention(s): Limited activity within patient's tolerance, Monitored during session, Premedicated before session, Repositioned    Home Living                          Prior Function            PT Goals (current goals can now be found in the  care plan section) Acute Rehab PT Goals Patient Stated Goal: to go home PT Goal Formulation: With patient Time For Goal Achievement: 02/03/22 Potential to Achieve Goals: Good Progress towards PT goals: Progressing toward goals    Frequency    BID      PT Plan Current plan remains appropriate    Co-evaluation              AM-PAC PT "6 Clicks" Mobility   Outcome Measure  Help needed turning from your back to your side while in a flat bed without using bedrails?: A Little Help needed moving from lying on your back to sitting on the side of a flat bed without using bedrails?: A Little Help needed moving to and from a bed to a chair (including a wheelchair)?: A Little Help needed standing up from a chair using your arms (e.g., wheelchair or bedside chair)?: A Little Help needed to walk in hospital room?: A Little Help needed climbing 3-5 steps with a railing? : A Little 6 Click Score: 18    End of Session Equipment Utilized During Treatment: Gait belt Activity Tolerance: Patient limited by pain;Other (comment) (and lightheadedness) Patient left: in chair;with call bell/phone within reach  Nurse Communication: Mobility status PT Visit Diagnosis: Other abnormalities of gait and mobility (R26.89);Muscle weakness (generalized) (M62.81);Pain Pain - Right/Left: Left Pain - part of body: Leg     Time: 0820-0852 PT Time Calculation (min) (ACUTE ONLY): 32 min  Charges:  $Gait Training: 8-22 mins $Therapeutic Activity: 8-22 mins                     TRUE Garciamartinez, SPT   Jackolyn Geron 01/21/2022, 12:54 PM

## 2022-01-21 NOTE — Progress Notes (Signed)
Occupational Therapy Treatment Patient Details Name: Lisa [name] MRN: [id number] DOB: 1970-10-01 Today's Date: 01/21/2022   History of present illness Pt is a 51 y.o. female s/p L4-S1 decompression (including central laminectomy and B medial facetectomies including foraminotomis) secondary to lumbar stenosis with neurogenic claudication.  PMH includes asthma, PNA, htn, HA's, anxiety, bipolar disorder, schizophrenia, anemia, blood dyscrasia.   OT comments  Pt seen for OT treatment on this date. Upon arrival to room pt awake and alert, lying with HOB elevated and nursing present. Pt reporting 9/10 pain throughout session in back and R LE - nurse provided pain medication at beginning of session. Pt agreeable to tx focused on ADL t/f and LBD. Pt able to recall 3/3 spinal pcns. Pt required CGA for side lying>sit and STS. CGA for pulling pants/underpants in place in standing for balance- demonstrated recall of reacher use for LBD while seated. CGA + RW with min vcs to maintain pcns for toilet t/f. CGA for grooming tasks at the sink. Pt educated on safe ADL participation. Pt left with PT in room. Pt making good progress toward goals. Pt continues to benefit from skilled OT services to maximize return to PLOF and minimize risk of future falls, injury. Will continue to follow POC. Discharge recommendation remains appropriate.     Recommendations for follow up therapy are one component of a multi-disciplinary discharge planning process, led by the attending physician.  Recommendations may be updated based on patient status, additional functional criteria and insurance authorization.    Follow Up Recommendations  No OT follow up    Assistance Recommended at Discharge Set up Supervision/Assistance  Patient can return home with the following  A little help with walking and/or transfers;A lot of help with bathing/dressing/bathroom;Assistance with cooking/housework;Assist for transportation    Equipment Recommendations  BSC/3in1    Recommendations for Other Services      Precautions / Restrictions Precautions Precautions: Back;Fall Precaution Comments: No brace needed. Restrictions Weight Bearing Restrictions: No       Mobility Bed Mobility Overal bed mobility: Needs Assistance Bed Mobility: Sidelying to Sit   Sidelying to sit: Min guard            Transfers Overall transfer level: Needs assistance Equipment used: Rolling walker (2 wheels) Transfers: Sit to/from Stand Sit to Stand: Min guard                 Balance Overall balance assessment: Needs assistance Sitting-balance support: No upper extremity supported, Feet supported Sitting balance-Leahy Scale: Good     Standing balance support: Bilateral upper extremity supported, During functional activity, Reliant on assistive device for balance Standing balance-Leahy Scale: Good                             ADL either performed or assessed with clinical judgement   ADL Overall ADL's : Needs assistance/impaired                                       General ADL Comments: MOD I for threading pants with reacher while seated, CGA to pull pants/underpants into place in standing. CGA + RW with min vcs to maintain pcns for toilet t/f. CGA for grooming tasks at the sink.      Cognition Arousal/Alertness: Awake/alert Behavior During Therapy: WFL for tasks assessed/performed Overall Cognitive Status: Within Functional Limits for tasks assessed  General Comments: Pt able to state all back precautions accurately at start of session.                   Pertinent Vitals/ Pain       Pain Assessment Pain Assessment: 0-10 Pain Score: 9  Pain Location: back and R LE Pain Descriptors / Indicators: Discomfort, Grimacing, Guarding, Sore, Shooting Pain Intervention(s): Limited activity within patient's tolerance, Monitored during  session, Repositioned, RN gave pain meds during session   Frequency  Min 2X/week        Progress Toward Goals  OT Goals(current goals can now be found in the care plan section)  Progress towards OT goals: Progressing toward goals  Acute Rehab OT Goals Patient Stated Goal: to go home OT Goal Formulation: With patient Time For Goal Achievement: 02/03/22 Potential to Achieve Goals: Good ADL Goals Pt Will Perform Grooming: with modified independence;standing Pt Will Perform Lower Body Dressing: with modified independence;with adaptive equipment;sit to/from stand Pt Will Transfer to Toilet: with modified independence;regular height toilet;ambulating  Plan Discharge plan remains appropriate;Frequency remains appropriate       AM-PAC OT "6 Clicks" Daily Activity     Outcome Measure   Help from another person eating meals?: None Help from another person taking care of personal grooming?: A Little Help from another person toileting, which includes using toliet, bedpan, or urinal?: A Little Help from another person bathing (including washing, rinsing, drying)?: A Little Help from another person to put on and taking off regular upper body clothing?: A Little Help from another person to put on and taking off regular lower body clothing?: A Little 6 Click Score: 19    End of Session Equipment Utilized During Treatment: Gait belt;Rolling walker (2 wheels)  OT Visit Diagnosis: Muscle weakness (generalized) (M62.81)   Activity Tolerance Patient tolerated treatment well;Patient limited by pain   Patient Left Other (comment) (Left pt with PT in room)   Nurse Communication          Time: 9628-3662 OT Time Calculation (min): 19 min  Charges: OT General Charges $OT Visit: 1 Visit OT Treatments $Self Care/Home Management : 8-22 mins  [employer], OTDS  [employer] 01/21/2022, 4:26 PM

## 2022-01-21 NOTE — Progress Notes (Signed)
Patient noted kneeling on the floor in the room by the bedside by nursing,front wheel walker was in front of the Pt.Patient reported she attempted to transfer herself from the bed and ambulate to the BR w/o nursing assistance,and was unable to stand by herself.Pt was assessed per protocol. patient denied hitting her head on the floor or any object within the vicinity.neuro checks initiated w/o any changes in baseline,AROM on all extremities. patient was assisted by 2 nursing staff to transfer back into bed.MD notified per protocol,no new orders at this time.family was also notified per policy and expressed understanding.patient was educated to call for assistance by using call button. Bed is in the lowest position.CL within reach.will cont to monitor.VS entered into Epic and wnl.

## 2022-01-21 NOTE — Progress Notes (Signed)
PT Cancellation Note  Patient Details Name: Lisa [name] MRN: [id number] DOB: [birth year]   Cancelled Treatment:    Reason Eval/Treat Not Completed: Other (comment) Attempted to see pt this PM. Upon arrival, pt reported a recent fall in room. She said she was on the way to use the bathroom by herself. Pt reported 9/10 pain down R LE while lying supine in bed. Will re-attempt after pt has had time to rest.  SPT and Pt spoke with RN, pt vitals s/p fall WFLs, did not hit her head, not currently pending any imaging, to re-attempt as able.    Trysta Showman, SPT  Tillmon Kisling 01/21/2022, 2:07 PM

## 2022-01-21 NOTE — Progress Notes (Signed)
Physical Therapy Treatment Patient Details Name: Lisa [name] MRN: [id number] DOB: [birth year] Today's Date: 01/21/2022   History of Present Illness Pt is a 51 y.o. female s/p L4-S1 decompression (including central laminectomy and B medial facetectomies including foraminotomis) secondary to lumbar stenosis with neurogenic claudication.  PMH includes asthma, PNA, htn, HA's, anxiety, bipolar disorder, schizophrenia, anemia, blood dyscrasia.   PT Comments    Pt received in standing with transfer of care from OT. Pt ambulated 120 feet with RW and CGA. Observed inconsistent gait pattern with occasional difficulty advancing R LE and had one bout of her L knee buckling immediately followed by L LE stance with R LE elevated off the floor without LOB. Pt reported shooting pain from R buttocks down R LE to knee (9/10 at start of session, 8/10 seated in recliner at end of session) since unwitnessed fall earlier this afternoon. Pt reported lightheadedness symptom throughout duration of session at 8/10 standing after 20 feet of ambulation and 5/10 seated in recliner at end of session. BP monitored in standing after 20 feet of ambulation BP 98/64 HR 79 and after an additional 100 feet ambulation BP 102/54 HR 75. During BP check in standing, pt closed eyes and had posterior/lateral sway in static standing. MD and RN notified of patient's status and presentation.   Recommendations for follow up therapy are one component of a multi-disciplinary discharge planning process, led by the attending physician.  Recommendations may be updated based on patient status, additional functional criteria and insurance authorization.  Follow Up Recommendations  Home health PT     Assistance Recommended at Discharge Intermittent Supervision/Assistance  Patient can return home with the following A little help with walking and/or transfers;A little help with bathing/dressing/bathroom;Assistance with  cooking/housework;Assist for transportation;Help with stairs or ramp for entrance   Equipment Recommendations  Rolling walker (2 wheels);BSC/3in1    Recommendations for Other Services       Precautions / Restrictions Precautions Precautions: Back;Fall Precaution Comments: No brace needed. Restrictions Weight Bearing Restrictions: No     Mobility  Bed Mobility Overal bed mobility: Needs Assistance Bed Mobility: Rolling, Sidelying to Sit Rolling: Supervision Sidelying to sit: Supervision       General bed mobility comments: NT this session; pt received from OT in bathroom and left seated in recliner.    Transfers Overall transfer level: Needs assistance Equipment used: Rolling walker (2 wheels) Transfers: Sit to/from Stand Sit to Stand: Min guard           General transfer comment: increased time/effort to complete stand-to-sit in recliner; pt utilized good technique to stand and required verbal cues for maintaining precautions and utilizing core musculature to sit    Ambulation/Gait Ambulation/Gait assistance: Min guard Gait Distance (Feet): 120 Feet Assistive device: Rolling walker (2 wheels) Gait Pattern/deviations: Narrow base of support, Step-through pattern, Antalgic, Knees buckling, Decreased stride length Gait velocity: decreased     General Gait Details: Pts gait inconsistent. Steady with RW initially. Pt required standing rest breaks due to R LE shooting pain after an unwitnessed fall earlier this afternoon. Pt at times had difficulty progressing R LE. Pt staggered stating her knee gave out. Observed the L knee gave out prior immediately prior to pt static standing on LL LE with R LE elevated off the ground. Pt continues to close eyes at times during static standing/ambulation.   Stairs             Wheelchair Mobility    Modified Rankin (Stroke Patients Only)  Balance Overall balance assessment: Needs assistance Sitting-balance  support: No upper extremity supported, Feet supported Sitting balance-Leahy Scale: Good Sitting balance - Comments: steady sitting in recliner without UE support, no LOB   Standing balance support: Single extremity supported, Bilateral upper extremity supported, Reliant on assistive device for balance Standing balance-Leahy Scale: Fair Standing balance comment: steady ambulating with RW; static standing balance at bedside with 1 UE supported while BP taken, no LOB. However, pt swayed L/R/posteriorly in static standing during BP check with eyes closed.                            Cognition Arousal/Alertness: Awake/alert Behavior During Therapy: WFL for tasks assessed/performed Overall Cognitive Status: Within Functional Limits for tasks assessed                                 General Comments: Pt able to state all back precautions accurately at start of session.        Exercises      General Comments General comments (skin integrity, edema, etc.): Pt BP assessed in standing at doorway after ambulating 20 feet BP 98/64 with HR 79 bpm, after ambulating an additional 100 feet BP 102/54 HR 75 bpm. Pt reported lightheadedness throughout duration of session at 8/10 in standing after ambulating 20 feet and 5/10 seated at end of session.      Pertinent Vitals/Pain Pain Assessment Pain Assessment: 0-10 Pain Score: 9  (9/10 at start of session, 8/10 in recliner at end of session) Pain Location: R buttocks down R LE to knee Pain Descriptors / Indicators: Shooting Pain Intervention(s): Limited activity within patient's tolerance, Monitored during session, Premedicated before session, Repositioned    Home Living                          Prior Function            PT Goals (current goals can now be found in the care plan section) Acute Rehab PT Goals Patient Stated Goal: to go home PT Goal Formulation: With patient Time For Goal Achievement:  02/03/22 Potential to Achieve Goals: Good Progress towards PT goals: Progressing toward goals    Frequency    BID      PT Plan Current plan remains appropriate    Co-evaluation              AM-PAC PT "6 Clicks" Mobility   Outcome Measure  Help needed turning from your back to your side while in a flat bed without using bedrails?: A Little Help needed moving from lying on your back to sitting on the side of a flat bed without using bedrails?: A Little Help needed moving to and from a bed to a chair (including a wheelchair)?: A Little Help needed standing up from a chair using your arms (e.g., wheelchair or bedside chair)?: A Little Help needed to walk in hospital room?: A Little Help needed climbing 3-5 steps with a railing? : A Little 6 Click Score: 18    End of Session Equipment Utilized During Treatment: Gait belt Activity Tolerance: Patient limited by pain;Other (comment) Patient left: in chair;with call bell/phone within reach;with chair alarm set Nurse Communication: Mobility status PT Visit Diagnosis: Other abnormalities of gait and mobility (R26.89);Muscle weakness (generalized) (M62.81);Pain Pain - Right/Left: Right Pain - part of body: Leg  Time: 5973-3125 PT Time Calculation (min) (ACUTE ONLY): 24 min  Charges:                        Rella Larve, SPT  Marri Mcneff 01/21/2022, 4:22 PM

## 2022-01-21 NOTE — Progress Notes (Signed)
Secure chat with Dr. Izora Ribas regarding the PIV order. Pt likely to be discharged today with no IV fluids ordered, and 1 IVP. MD stated he was ok with not placing the PIV at this time. LPN made aware.

## 2022-01-21 NOTE — Progress Notes (Addendum)
    Attending Progress Note  History: Lisa [name] is a 51 y.o status post L4-S1 decompression.  POD2: improved left leg pain. Continues back pain and some nausea this morning.   POD1: She complains of back pain and nonpositional headache this morning. She did have some leg pain overnight but seems to be improved from pre-op  Physical Exam: Vitals:   01/20/22 2026 01/21/22 0313  BP:  101/66  Pulse:  (!) 51  Resp:  16  Temp:  98.6 F (37 C)  SpO2: 100% 98%    AA Ox3 CNI  Strength:3/5 throughout BLE 2/2 to effort.   Data:  No results for input(s): "NA", "K", "CL", "CO2", "BUN", "CREATININE", "LABGLOM", "GLUCOSE", "CALCIUM" in the last 168 hours. No results for input(s): "AST", "ALT", "ALKPHOS" in the last 168 hours.  Invalid input(s): "TBILI"   No results for input(s): "WBC", "HGB", "HCT", "PLT" in the last 168 hours. Recent Labs  Lab 01/19/22 0905  INR 1.1          Other tests/results: none   Assessment/Plan:  Lisa [name] is a 51 year old status post L4-S1 decompression on 01/20/22   - mobilize - pain control - DVT prophylaxis - PTOT; plan for [facility] at d/c  Cooper Render PA-C Department of Neurosurgery   This is an addendum to the note above.  We were made aware of a fall after therapy this afternoon.  She hit her knee but nothing else.  This was not witnessed, but occurred when she attempted to transfer without assistance.    We rounded and saw her about 1645.  She is having some radicular irritation on the right.  She has full stength and no sensory changes.  Based on her reassuring exam, we will monitor her condition, change some pain medications and start her on gabapentin for radicular irritation.  I am optimistic that her pain will improve in the coming days.

## 2022-01-22 LAB — CBC
HCT: 35.7 % — ABNORMAL LOW (ref 36.0–46.0)
Hemoglobin: 11.8 g/dL — ABNORMAL LOW (ref 12.0–15.0)
MCH: 28.9 pg (ref 26.0–34.0)
MCHC: 33.1 g/dL (ref 30.0–36.0)
MCV: 87.3 fL (ref 80.0–100.0)
Platelets: 201 10*3/uL (ref 150–400)
RBC: 4.09 MIL/uL (ref 3.87–5.11)
RDW: 13.8 % (ref 11.5–15.5)
WBC: 11.3 10*3/uL — ABNORMAL HIGH (ref 4.0–10.5)
nRBC: 0 % (ref 0.0–0.2)

## 2022-01-22 MED ORDER — ZOLPIDEM TARTRATE 5 MG PO TABS
5.0000 mg | ORAL_TABLET | Freq: Every evening | ORAL | Status: DC | PRN
  Administered 2022-01-22 – 2022-01-23 (×3): 5 mg via ORAL
  Filled 2022-01-22 (×3): qty 1

## 2022-01-22 NOTE — NC FL2 (Signed)
[place] LEVEL OF CARE SCREENING TOOL     IDENTIFICATION  Patient Name: Lisa [name] Birthdate: May 31, 1971 Sex: female Admission Date (Current Location): 01/19/2022  [place] and [place] Number:  Engineering geologist and Address:  [facility], [address] [place],  [address]      Provider Number: [id number]  Attending Physician Name and Address:  Meade Maw, MD  Relative Name and Phone Number:  Elby Showers [phone]    Current Level of Care: Hospital Recommended Level of Care: [place] Prior Approval Number:    Date Approved/Denied:   PASRR Number: [id number]  Discharge Plan: SNF    Current Diagnoses: Patient Active Problem List   Diagnosis Date Noted   Lumbar radiculopathy 01/19/2022   S/P hysterectomy 08/15/2019   S/P laparoscopic hysterectomy 08/15/2019   Chronic constipation 03/21/2019   Menorrhagia 03/21/2019   Migraine 03/21/2019   Vocal cord polyps 03/21/2019   Acute deep vein thrombosis (DVT) of popliteal vein of right lower extremity ([place]) 03/15/2019   Iron deficiency anemia due to chronic blood loss 03/15/2019   Symptomatic anemia 03/03/2019   Asthma without status asthmaticus 08/10/2017   Depression 08/10/2017   Obesity, Class III, BMI 40-49.9 (morbid obesity) ([place]) 08/10/2017   Prediabetes 04/19/2017   Essential hypertension 04/16/2017   DVT (deep venous thrombosis) ([place]) 12/13/2014   Pulmonary embolism ([place]) 12/13/2014   Nonspecific interstitial pneumonia ([place]) 08/22/2014   Cough, persistent 04/30/2014   Obstructive apnea 04/30/2014   Endometrial polyp 09/30/2013   High BMI 08/25/2013   Dysmenorrhea 08/25/2013   Abdominal bloating 08/25/2013   Schizoaffective disorder ([place]) 05/12/2013   PTSD (post-traumatic stress disorder) 05/12/2013   Bipolar I disorder, most recent episode (or current) depressed, unspecified 05/12/2013   Abdominal pain, generalized  05/28/2011   Esophageal reflux 05/28/2011    Orientation RESPIRATION BLADDER Height & Weight     Self, Time, Situation, Place  Normal Continent Weight: 92.9 kg Height:  5' 7.5" (171.5 cm)  BEHAVIORAL SYMPTOMS/MOOD NEUROLOGICAL BOWEL NUTRITION STATUS      Continent Diet (see DC summary)  AMBULATORY STATUS COMMUNICATION OF NEEDS Skin   Extensive Assist Verbally Normal, Surgical wounds                       Personal Care Assistance Level of Assistance  Bathing, Feeding, Dressing Bathing Assistance: Limited assistance Feeding assistance: Independent Dressing Assistance: Maximum assistance     Functional Limitations Info             SPECIAL CARE FACTORS FREQUENCY  PT (By licensed PT)     PT Frequency: 5 times per week              Contractures Contractures Info: Not present    Additional Factors Info  Code Status, Allergies Code Status Info: full code Allergies Info: Lisinopril, Morphine And Related           Current Medications (01/22/2022):  This is the current hospital active medication list Current Facility-Administered Medications  Medication Dose Route Frequency Provider Last Rate Last Admin   0.9 %  sodium chloride infusion  250 mL Intravenous Continuous Loleta Dicker, PA 250 mL/hr at 01/19/22 1442 250 mL at 01/19/22 1442   acetaminophen (TYLENOL) tablet 1,000 mg  1,000 mg Oral Q6H Loleta Dicker, PA   1,000 mg at 01/22/22 0019   albuterol (PROVENTIL) (2.5 MG/3ML) 0.083% nebulizer solution 3 mL  3 mL Nebulization Q6H PRN Loleta Dicker, PA  azaTHIOprine (IMURAN) tablet 50 mg  50 mg Oral BH-q7a Koch, Danielle Lee, [place]   50 mg at 01/22/22 1013   bisacodyl (DULCOLAX) suppository 10 mg  10 mg Rectal Daily PRN Loleta Dicker, PA       budesonide (PULMICORT) nebulizer solution 0.25 mg  0.25 mg Nebulization BID Loleta Dicker, PA   0.25 mg at 01/22/22 2993   butalbital-acetaminophen-caffeine (FIORICET) 50-325-40 MG per tablet 1 tablet   1 tablet Oral Q6H PRN Meade Maw, MD       dexamethasone (DECADRON) tablet 4 mg  4 mg Oral Q6H Loleta Dicker, PA   4 mg at 01/22/22 1013   docusate sodium (COLACE) capsule 100 mg  100 mg Oral BID Loleta Dicker, PA   100 mg at 01/22/22 1012   enoxaparin (LOVENOX) injection 40 mg  40 mg Subcutaneous Q24H Loleta Dicker, [place]   40 mg at 01/22/22 7169   ferrous sulfate tablet 325 mg  325 mg Oral Q breakfast Loleta Dicker, [place]   325 mg at 01/22/22 0818   gabapentin (NEURONTIN) capsule 300 mg  300 mg Oral TID Loleta Dicker, PA   300 mg at 01/22/22 1012   Ipratropium-Albuterol (COMBIVENT) respimat 1 puff  1 puff Inhalation Everlean Cherry, [place]   1 puff at 01/22/22 6789   lamoTRIgine (LAMICTAL) tablet 75 mg  75 mg Oral Everlean Cherry, [place]   75 mg at 01/22/22 1010   menthol-cetylpyridinium (CEPACOL) lozenge 3 mg  1 lozenge Oral PRN Loleta Dicker, PA       Or   phenol (CHLORASEPTIC) mouth spray 1 spray  1 spray Mouth/Throat PRN Loleta Dicker, PA       methocarbamol (ROBAXIN) tablet 500 mg  500 mg Oral Q6H Loleta Dicker, PA   500 mg at 01/22/22 1010   ondansetron (ZOFRAN) tablet 4 mg  4 mg Oral Q6H PRN Loleta Dicker, PA   4 mg at 01/21/22 3810   oxyCODONE (Oxy IR/ROXICODONE) immediate release tablet 10 mg  10 mg Oral Q4H PRN Meade Maw, MD   10 mg at 01/22/22 1010   oxyCODONE (Oxy IR/ROXICODONE) immediate release tablet 5 mg  5 mg Oral Q4H PRN Meade Maw, MD       pantoprazole (PROTONIX) EC tablet 40 mg  40 mg Oral BH-q7a Koch, Danielle Lee, [place]   40 mg at 01/22/22 1012   polyethylene glycol (MIRALAX / GLYCOLAX) packet 17 g  17 g Oral Daily PRN Loleta Dicker, PA       prochlorperazine (COMPAZINE) tablet 5 mg  5 mg Oral Q6H PRN Meade Maw, MD       senna (SENOKOT) tablet 8.6 mg  1 tablet Oral BID Loleta Dicker, PA   8.6 mg at 01/22/22 1011   sodium chloride flush (NS) 0.9 % injection 3 mL  3 mL Intravenous  Q12H Loleta Dicker, PA   3 mL at 01/21/22 2235   sodium chloride flush (NS) 0.9 % injection 3 mL  3 mL Intravenous PRN Loleta Dicker, PA       sodium phosphate (FLEET) 7-19 GM/118ML enema 1 enema  1 enema Rectal Once PRN Loleta Dicker, PA       SUMAtriptan (IMITREX) tablet 100 mg  100 mg Oral Q2H PRN Loleta Dicker, PA       valACYclovir (VALTREX) tablet 1,000 mg  1,000 mg Oral Daily Loleta Dicker, PA   1,000  mg at 01/22/22 1012   zolpidem (AMBIEN) tablet 5 mg  5 mg Oral QHS PRN Meade Maw, MD   5 mg at 01/22/22 0019     Discharge Medications: Please see discharge summary for a list of discharge medications.  Relevant Imaging Results:  Relevant Lab Results:   Additional Information SS# [id number]  Conception Oms, RN

## 2022-01-22 NOTE — Progress Notes (Signed)
Physical Therapy Treatment Patient Details Name: Lisa [name] MRN: [id number] DOB: Apr 16, 1971 Today's Date: 01/22/2022   History of Present Illness Pt is a 51 y.o. female s/p L4-S1 decompression (including central laminectomy and B medial facetectomies including foraminotomis) secondary to lumbar stenosis with neurogenic claudication.  PMH includes asthma, PNA, htn, HA's, anxiety, bipolar disorder, schizophrenia, anemia, blood dyscrasia.    PT Comments    Pt agreeable to PT treatment session this PM. Pt received seated in recliner and reported lightheadedness at 3/10 at start of session. Pt reported pain at 9.5/10 primarily in back at start of session. Pt transported to PT gym in recliner for pt education, demonstration and completion of steps. Pt completed x2 steps ascending backwards/descending forwards with RW and CGA with +1 assist for RW stabilization and +1 assist to side for safety. Pt also completed x2 bouts of 4 steps utilizing 1 rail. Pt demonstrated ability to ascend/descend stairs safely with assistance, but is limited by lightheadedness and pain. Pt then ambulated 200 feet with RW and CGA with standing rest breaks taken due to pain/lightheadedness. Discussed disposition with pt stating that from a mobility standpoint, pt able to ambulate and complete stairs necessary to go home, but is limited by lightheadedness and pain. Due to these deficits, recommend 24/7 supervision/assist at discharge to assist with mobility. Pt stated she would not have 24/7 support at home. Follow physician's recommendations for discharge plan and follow up therapies.    Recommendations for follow up therapy are one component of a multi-disciplinary discharge planning process, led by the attending physician.  Recommendations may be updated based on patient status, additional functional criteria and insurance authorization.  Follow Up Recommendations  Follow physician's recommendations for discharge  plan and follow up therapies Can patient physically be transported by private vehicle: Yes   Assistance Recommended at Discharge Frequent or constant Supervision/Assistance  Patient can return home with the following A little help with walking and/or transfers;A little help with bathing/dressing/bathroom;Assistance with cooking/housework;Assist for transportation;Help with stairs or ramp for entrance   Equipment Recommendations  Rolling walker (2 wheels);BSC/3in1    Recommendations for Other Services       Precautions / Restrictions Precautions Precautions: Back;Fall Precaution Comments: No brace needed. Restrictions Weight Bearing Restrictions: No     Mobility  Bed Mobility Overal bed mobility: Needs Assistance Bed Mobility: Sit to Sidelying, Rolling Rolling: Supervision     Sit to sidelying: Min assist General bed mobility comments: Pt required min A support at bilat LE to lift onto bed while maintaining back precautions.    Transfers Overall transfer level: Needs assistance Equipment used: Rolling walker (2 wheels) Transfers: Sit to/from Stand Sit to Stand: Min guard           General transfer comment: increased time/effort to perform. CGA for safety    Ambulation/Gait Ambulation/Gait assistance: Min guard Gait Distance (Feet): 200 Feet Assistive device: Rolling walker (2 wheels) Gait Pattern/deviations: Step-through pattern, Antalgic Gait velocity: decreased     General Gait Details: Pt ambulated 200 feet with a few standing rest breaks with RW, CGA, and chair follow. Verbal cues for continuous gait pattern with shorter/wider steps. Pt performs well when eyes remain open. Repeated verbal cues for maintaining eyes open throughout duration of session and ambulation.   Stairs Stairs: Yes Stairs assistance: Min guard, +2 safety/equipment Stair Management: One rail Right, Forwards, Backwards, With walker Number of Stairs: 10 General stair comments: Pt  education and demonstration provided for ascending/descending steps with RW with no rails  for 2 steps into home and for ascending with 1 R rail for flight of steps within home. Pt completed 2 steps with RW ascending backwards/descending forwards with CGA by 1 individual, +1 stabilizing RW, and +1 to side for safety due to pt lightheadedness. Pt then completed 2 bouts of 4 steps with 1 rail. Pt reported lightheadedness as 5-6/10 after stairs.   Wheelchair Mobility    Modified Rankin (Stroke Patients Only)       Balance Overall balance assessment: Needs assistance Sitting-balance support: No upper extremity supported, Feet supported Sitting balance-Leahy Scale: Good Sitting balance - Comments: steady sitting EOB and in recliner   Standing balance support: Bilateral upper extremity supported, During functional activity, Reliant on assistive device for balance Standing balance-Leahy Scale: Fair Standing balance comment: steady ambulating with RW; static standing balance pt has increased sway L/R/posteriorly especially when eyes are closed. Consistent verbal cueing to maintain eyes open.                            Cognition Arousal/Alertness: Awake/alert Behavior During Therapy: WFL for tasks assessed/performed Overall Cognitive Status: Within Functional Limits for tasks assessed                                          Exercises      General Comments      Pertinent Vitals/Pain Pain Assessment Pain Assessment: 0-10 Pain Score: 9  Pain Location: back and R LE Pain Descriptors / Indicators: Discomfort, Grimacing, Guarding, Sore, Shooting Pain Intervention(s): Limited activity within patient's tolerance, Monitored during session, Premedicated before session, Repositioned    Home Living                          Prior Function            PT Goals (current goals can now be found in the care plan section) Acute Rehab PT Goals Patient Stated  Goal: to go home PT Goal Formulation: With patient Time For Goal Achievement: 02/03/22 Potential to Achieve Goals: Good Progress towards PT goals: Progressing toward goals    Frequency    BID      PT Plan Discharge plan needs to be updated    Co-evaluation              AM-PAC PT "6 Clicks" Mobility   Outcome Measure  Help needed turning from your back to your side while in a flat bed without using bedrails?: A Little Help needed moving from lying on your back to sitting on the side of a flat bed without using bedrails?: A Little Help needed moving to and from a bed to a chair (including a wheelchair)?: A Little Help needed standing up from a chair using your arms (e.g., wheelchair or bedside chair)?: A Little Help needed to walk in hospital room?: A Little Help needed climbing 3-5 steps with a railing? : A Little 6 Click Score: 18    End of Session Equipment Utilized During Treatment: Gait belt Activity Tolerance: Patient limited by pain;Other (comment) (lightheadedness) Patient left: in bed;with call bell/phone within reach;with bed alarm set;with family/visitor present Nurse Communication: Mobility status PT Visit Diagnosis: Other abnormalities of gait and mobility (R26.89);Muscle weakness (generalized) (M62.81);Pain Pain - Right/Left: Right Pain - part of body: Leg     Time: 6812-7517 PT  Time Calculation (min) (ACUTE ONLY): 32 min  Charges:                       Rella Larve, SPT  Torrey Ballinas 01/22/2022, 4:32 PM

## 2022-01-22 NOTE — TOC Progression Note (Signed)
Transition of Care [facility]) - Progression Note    Patient Details  Name: Lisa [name] MRN: [id number] Date of Birth: 1971/05/16  Transition of Care [facility]) CM/SW [place], RN Phone Number: 01/22/2022, 11:48 AM  Clinical Narrative:    Patient agrees to a bedsearch, Bedsearch done, Fl2 complete, PASSR obtained   Expected Discharge Plan: [place] Barriers to Discharge: Barriers Resolved  Expected Discharge Plan and Services Expected Discharge Plan: [place]   Discharge Planning Services: CM Consult   Living arrangements for the past 2 months: Single Family Home                 DME Arranged: Walker rolling, 3-N-1 DME Agency: [employer] Date DME Agency Contacted: 01/21/22 Time DME Agency Contacted: 1107 Representative spoke with at DME Agency: [place]: PT [place]: [place] Date [place]: 01/21/22 Time [place]: 1059 Representative spoke with at [place]: Meg   Social Determinants of Health ([place]) Interventions    Readmission Risk Interventions     No data to display

## 2022-01-22 NOTE — TOC Progression Note (Signed)
Transition of Care [facility]) - Progression Note    Patient Details  Name: Lisa [name] MRN: [id number] Date of Birth: 1970-07-17  Transition of Care [facility]) CM/SW [place], RN Phone Number: 01/22/2022, 11:21 AM  Clinical Narrative:   Met with the patient and she is agreeable to a bedsearch    Expected Discharge Plan: [place] Barriers to Discharge: Barriers Resolved  Expected Discharge Plan and Services Expected Discharge Plan: [place]   Discharge Planning Services: CM Consult   Living arrangements for the past 2 months: Single Family Home                 DME Arranged: Walker rolling, 3-N-1 DME Agency: [employer] Date DME Agency Contacted: 01/21/22 Time DME Agency Contacted: 1107 Representative spoke with at DME Agency: [place]: PT [place]: [place] Date [place]: 01/21/22 Time [place]: 1059 Representative spoke with at [place]: Meg   Social Determinants of Health ([place]) Interventions    Readmission Risk Interventions     No data to display

## 2022-01-22 NOTE — Discharge Instructions (Addendum)
Your surgeon has performed an operation on your lumbar spine (low back) to relieve pressure on one or more nerves. Many times, patients feel better immediately after surgery and can "overdo it." Even if you feel well, it is important that you follow these activity guidelines. If you do not let your back heal properly from the surgery, you can increase the chance of a disc herniation and/or return of your symptoms. The following are instructions to help in your recovery once you have been discharged from the hospital.  *Please follow plans for Lovenox bridge discussed pre-op.   Activity    No bending, lifting, or twisting ("BLT"). Avoid lifting objects heavier than 10 pounds (gallon milk jug).  Where possible, avoid household activities that involve lifting, bending, pushing, or pulling such as laundry, vacuuming, grocery shopping, and childcare. Try to arrange for help from friends and family for these activities while your back heals.  Increase physical activity slowly as tolerated.  Taking short walks is encouraged, but avoid strenuous exercise. Do not jog, run, bicycle, lift weights, or participate in any other exercises unless specifically allowed by your doctor. Avoid prolonged sitting, including car rides.  Talk to your doctor before resuming sexual activity.  You should not drive until cleared by your doctor.  Until released by your doctor, you should not return to work or school.  You should rest at home and let your body heal.   You may shower two days after your surgery.  After showering, lightly dab your incision dry. Do not take a tub bath or go swimming for 3 weeks, or until approved by your doctor at your follow-up appointment.  If you smoke, we strongly recommend that you quit.  Smoking has been proven to interfere with normal healing in your back and will dramatically reduce the success rate of your surgery. Please contact [facility] (800-QUIT-NOW) and use the resources at  www.[facility].com for assistance in stopping smoking.  Surgical Incision   If you have a dressing on your incision, you may remove it three days after your surgery. Keep your incision area clean and dry.  If you have staples or stitches on your incision, you should have a follow up scheduled for removal. If you do not have staples or stitches, you will have steri-strips (small pieces of surgical tape) or Dermabond glue. The steri-strips/glue should begin to peel away within about a week (it is fine if the steri-strips fall off before then). If the strips are still in place one week after your surgery, you may gently remove them.  Diet            You may return to your usual diet. Be sure to stay hydrated.  When to Contact us  Although your surgery and recovery will likely be uneventful, you may have some residual numbness, aches, and pains in your back and/or legs. This is normal and should improve in the next few weeks.  However, should you experience any of the following, contact us immediately: New numbness or weakness Pain that is progressively getting worse, and is not relieved by your pain medications or rest Bleeding, redness, swelling, pain, or drainage from surgical incision Chills or flu-like symptoms Fever greater than 101.0 F (38.3 C) Problems with bowel or bladder functions Difficulty breathing or shortness of breath Warmth, tenderness, or swelling in your calf  Contact Information During office hours (Monday-Friday 9 am to 5 pm), please call your physician at [phone] After hours and weekends, please call [phone] and  speak with the answering service, who will contact the doctor on call.  If that fails, call the [place] Operator at [phone] and ask for the Neurosurgery Resident On Call  For a life-threatening emergency, call 911

## 2022-01-22 NOTE — Progress Notes (Signed)
Physical Therapy Treatment Patient Details Name: Lisa [name] MRN: [id number] DOB: 1971/06/29 Today's Date: 01/22/2022   History of Present Illness Pt is a 51 y.o. female s/p L4-S1 decompression (including central laminectomy and B medial facetectomies including foraminotomis) secondary to lumbar stenosis with neurogenic claudication.  PMH includes asthma, PNA, htn, HA's, anxiety, bipolar disorder, schizophrenia, anemia, blood dyscrasia.   PT Comments    Pt agreeable to PT this AM. Lightheadedness monitored throughout session: at rest at start of session 3/10 and increased to 6/10 during ambulation. Pt ambulated 140 feet with RW and CGA with seated rest break for approximately 5 minutes to see if lightheadedness would dissipate. However, symptoms continued with pt having continuous lightheadedness. Pt educated on 10/10 lightheadedness being passing out. Pt reports continued 7/10 --unsafe to attempt stairs this session. Pt then ambulated an additional 20 feet prior to having pt sit down due to pt closing her eyes and swaying in standing with decreased verbal response to questions. BP monitored: 117/71 HR 70 bpm sitting at rest at start of session, 110/71 HR 61 bpm after seated rest break after ambulating 140 feet. Pt transported back to room in recliner. Updated recommendation to STR based on pt symptoms limiting progress in PT. Pt has living needs on 2nd floor of townhome and needs to demonstrate safe performance ascending/descending a flight of stairs to discharge home safely.    Recommendations for follow up therapy are one component of a multi-disciplinary discharge planning process, led by the attending physician.  Recommendations may be updated based on patient status, additional functional criteria and insurance authorization.  Follow Up Recommendations  Skilled nursing-short term rehab (<3 hours/day) Can patient physically be transported by private vehicle: Yes   Assistance  Recommended at Discharge Intermittent Supervision/Assistance  Patient can return home with the following A little help with walking and/or transfers;A little help with bathing/dressing/bathroom;Assistance with cooking/housework;Assist for transportation;Help with stairs or ramp for entrance   Equipment Recommendations  Rolling walker (2 wheels);BSC/3in1    Recommendations for Other Services       Precautions / Restrictions Precautions Precautions: Back;Fall Precaution Comments: No brace needed. Restrictions Weight Bearing Restrictions: No     Mobility  Bed Mobility Overal bed mobility: Needs Assistance Bed Mobility: Rolling, Sidelying to Sit Rolling: Supervision Sidelying to sit: Supervision       General bed mobility comments: Supervision for maintenance of back precautions    Transfers Overall transfer level: Needs assistance Equipment used: Rolling walker (2 wheels) Transfers: Sit to/from Stand Sit to Stand: Min guard           General transfer comment: increased time/effort to complete sit-to-stand and stand-to-sit; pt utilized good technique to stand and required verbal cues for maintaining precautions and utilizing core musculature to sit.    Ambulation/Gait Ambulation/Gait assistance: Min guard Gait Distance (Feet): 140 Feet (pt ambulated 140 feet with standing rest breaks, took seated rest break, and then ambulated an additional 20 feet prior to having to sit down due to lightheadedness) Assistive device: Rolling walker (2 wheels) Gait Pattern/deviations: Narrow base of support, Antalgic, Step-through pattern Gait velocity: decreased     General Gait Details: Pts gait inconsistent. Steady with RW. Increased stance time bilaterally. Pt required standing rest breaks due to R LE shooting pain. Pt at times had difficulty advancing R LE during swing phase. Pt continues to close eyes at times during static standing/ambulation with increased sway  noted.   Stairs  Wheelchair Mobility    Modified Rankin (Stroke Patients Only)       Balance Overall balance assessment: Needs assistance Sitting-balance support: No upper extremity supported, Feet supported Sitting balance-Leahy Scale: Good Sitting balance - Comments: steady sitting EOB and in recliner   Standing balance support: Bilateral upper extremity supported, During functional activity, Reliant on assistive device for balance Standing balance-Leahy Scale: Fair Standing balance comment: steady ambulating with RW; static standing balance pt has increased sway L/R/posteriorly and tendency to close eyes                            Cognition Arousal/Alertness: Awake/alert Behavior During Therapy: WFL for tasks assessed/performed Overall Cognitive Status: Within Functional Limits for tasks assessed                                          Exercises      General Comments General comments (skin integrity, edema, etc.): BP monitored: 117/71 HR 70 bpm sitting at rest at start of session, 110/71 HR 61 bpm after seated rest break after ambulating 140 feet. Lightheadedness monitored throughout session: at rest seated 3/10, increased to 6/10 during ambulation, 7/10 upon sitting after ambulating 140 feet. Lightheadedness did not dissipate upon sitting. Pt educated on 10/10 lightheadedness being passing out. Pt reports continued 7/10 --unsafe to attempt stairs this session.      Pertinent Vitals/Pain Pain Assessment Pain Assessment: 0-10 Pain Score: 7  (Pt reported 7/10 pain at start, during ambulation, and after ambulation this date.) Pain Location: back and R LE Pain Descriptors / Indicators: Discomfort, Grimacing, Guarding, Sore, Shooting Pain Intervention(s): Limited activity within patient's tolerance, Monitored during session, Premedicated before session, Repositioned    Home Living                          Prior  Function            PT Goals (current goals can now be found in the care plan section) Acute Rehab PT Goals Patient Stated Goal: to go home PT Goal Formulation: With patient Time For Goal Achievement: 02/03/22 Potential to Achieve Goals: Good Progress towards PT goals: Progressing toward goals    Frequency    BID      PT Plan Discharge plan needs to be updated    Co-evaluation              AM-PAC PT "6 Clicks" Mobility   Outcome Measure  Help needed turning from your back to your side while in a flat bed without using bedrails?: A Little Help needed moving from lying on your back to sitting on the side of a flat bed without using bedrails?: A Little Help needed moving to and from a bed to a chair (including a wheelchair)?: A Little Help needed standing up from a chair using your arms (e.g., wheelchair or bedside chair)?: A Little Help needed to walk in hospital room?: A Little Help needed climbing 3-5 steps with a railing? : A Little 6 Click Score: 18    End of Session Equipment Utilized During Treatment: Gait belt Activity Tolerance: Patient limited by pain;Other (comment) (lightheadedness) Patient left: in chair;with call bell/phone within reach;with chair alarm set;with family/visitor present Nurse Communication: Mobility status PT Visit Diagnosis: Other abnormalities of gait and mobility (R26.89);Muscle weakness (generalized) (M62.81);Pain Pain -  Right/Left: Right Pain - part of body: Leg     Time: 0902-0939 PT Time Calculation (min) (ACUTE ONLY): 37 min  Charges:                        Benjaman Artman, SPT   Siah Steely 01/22/2022, 1:22 PM

## 2022-01-22 NOTE — Progress Notes (Signed)
    Attending Progress Note  History: Donisha Hoch is a 51 y.o status post L4-S1 decompression.  POD3: continued back pain overnight. Pt now complaining of throbbing pain down her right leg. Had a fall yesterday afternoon  POD2: improved left leg pain. Continues back pain and some nausea this morning.   POD1: She complains of back pain and nonpositional headache this morning. She did have some leg pain overnight but seems to be improved from pre-op  Physical Exam: Vitals:   01/22/22 0303 01/22/22 0726  BP: 101/61 110/67  Pulse: (!) 53 (!) 56  Resp: 16 17  Temp: 98.6 F (37 C) (!) 97.5 F (36.4 C)  SpO2: 96% 99%    AA Ox3 CNI  Strength:3/5 in bilateral HF and KE 5/5 distally. Exam limited due to effort and pain  Data:  No results for input(s): "NA", "K", "CL", "CO2", "BUN", "CREATININE", "LABGLOM", "GLUCOSE", "CALCIUM" in the last 168 hours. No results for input(s): "AST", "ALT", "ALKPHOS" in the last 168 hours.  Invalid input(s): "TBILI"   Recent Labs  Lab 01/22/22 0403  WBC 11.3*  HGB 11.8*  HCT 35.7*  PLT 201   Recent Labs  Lab 01/19/22 0905  INR 1.1          Other tests/results: none   Assessment/Plan:  Millie Forde is a 51 year old status post L4-S1 decompression on 01/20/22   - mobilize - pain control - DVT prophylaxis; will discharge home on lovenox bridge per pulm recommendation pre-op - PTOT; initially planned for [facility] at d/c, may need to reconsider inpatient placement given patient's continued difficulty with ambulation.   Cooper Render PA-C Department of Neurosurgery

## 2022-01-22 NOTE — Plan of Care (Signed)

## 2022-01-23 ENCOUNTER — Encounter: Payer: Self-pay | Admitting: Neurosurgery

## 2022-01-23 LAB — GLUCOSE, RANDOM: Glucose, Bld: 141 mg/dL — ABNORMAL HIGH (ref 70–99)

## 2022-01-23 LAB — GLUCOSE, CAPILLARY: Glucose-Capillary: 117 mg/dL — ABNORMAL HIGH (ref 70–99)

## 2022-01-23 NOTE — Progress Notes (Signed)
Physical Therapy Treatment Patient Details Name: Lisa [name] MRN: [id number] DOB: 1971/03/05 Today's Date: 01/23/2022   History of Present Illness Pt is a 51 y.o. female s/p L4-S1 decompression (including central laminectomy and B medial facetectomies including foraminotomis) secondary to lumbar stenosis with neurogenic claudication.  PMH includes asthma, PNA, htn, HA's, anxiety, bipolar disorder, schizophrenia, anemia, blood dyscrasia.   PT Comments    Pt agreeable to PT this date. Pt received and left in recliner. Pt performed sit-to-stand and stand-to-sit with RW, CGA, and increased time but able to maintain back precautions during functional mobility without verbal cues required this date. Pt reported pain in back and R LE at 7/10 at start of session and 8/10 after ambulation. Pt reported lightheadedness at 7/10 which remained consistent from start to end of session. Pt ambulated 230 feet continuously without rest breaks required with RW, CGA, and chair follow. Pt gait pattern improved today with continuous pattern and slightly increased speed compared to previous sessions. Pt required 1 verbal cue to maintain eyes open toward end of ambulation distance.   Recommendations for follow up therapy are one component of a multi-disciplinary discharge planning process, led by the attending physician.  Recommendations may be updated based on patient status, additional functional criteria and insurance authorization.  Follow Up Recommendations  Follow physician's recommendations for discharge plan and follow up therapies Can patient physically be transported by private vehicle: Yes   Assistance Recommended at Discharge Frequent or constant Supervision/Assistance  Patient can return home with the following A little help with walking and/or transfers;A little help with bathing/dressing/bathroom;Assistance with cooking/housework;Assist for transportation;Help with stairs or ramp for  entrance   Equipment Recommendations  Rolling walker (2 wheels);BSC/3in1    Recommendations for Other Services       Precautions / Restrictions Precautions Precautions: Back;Fall Precaution Comments: No brace needed. Restrictions Weight Bearing Restrictions: No     Mobility  Bed Mobility               General bed mobility comments: NT; pt received and left in chair    Transfers Overall transfer level: Needs assistance Equipment used: Rolling walker (2 wheels) Transfers: Sit to/from Stand Sit to Stand: Min guard           General transfer comment: increased time/effort to perform. CGA for safety    Ambulation/Gait Ambulation/Gait assistance: Min guard Gait Distance (Feet): 230 Feet Assistive device: Rolling walker (2 wheels) Gait Pattern/deviations: Step-through pattern, Antalgic Gait velocity: decreased     General Gait Details: Pt ambulated 230 feet with RW, CGA, and chair follow. Pt did not require rest breaks this date. Pt utilized continuous gait pattern with improved speed this date. Pt performs well when eyes remain open. 1 verbal cue for maintaining eyes open toward end of ambulation distance.   Stairs             Wheelchair Mobility    Modified Rankin (Stroke Patients Only)       Balance Overall balance assessment: Needs assistance Sitting-balance support: No upper extremity supported, Feet supported Sitting balance-Leahy Scale: Good Sitting balance - Comments: steady sitting in recliner able to reach outside BoS   Standing balance support: Bilateral upper extremity supported, Reliant on assistive device for balance Standing balance-Leahy Scale: Fair Standing balance comment: steady ambulating with RW. Maintains steady static balance if eyes remain open.  Cognition Arousal/Alertness: Awake/alert Behavior During Therapy: WFL for tasks assessed/performed Overall Cognitive Status: Within  Functional Limits for tasks assessed                                          Exercises      General Comments        Pertinent Vitals/Pain Pain Assessment Pain Assessment: 0-10 Pain Score: 7  (7/10 at start of session; 8/10 at end of session) Pain Location: back and R LE Pain Descriptors / Indicators: Discomfort, Grimacing, Guarding, Sore, Shooting Pain Intervention(s): Limited activity within patient's tolerance, Monitored during session, Premedicated before session, Repositioned    Home Living                          Prior Function            PT Goals (current goals can now be found in the care plan section) Acute Rehab PT Goals Patient Stated Goal: to go home PT Goal Formulation: With patient Time For Goal Achievement: 02/03/22 Potential to Achieve Goals: Good Progress towards PT goals: Progressing toward goals    Frequency    BID      PT Plan Current plan remains appropriate    Co-evaluation              AM-PAC PT "6 Clicks" Mobility   Outcome Measure  Help needed turning from your back to your side while in a flat bed without using bedrails?: A Little Help needed moving from lying on your back to sitting on the side of a flat bed without using bedrails?: A Little Help needed moving to and from a bed to a chair (including a wheelchair)?: A Little Help needed standing up from a chair using your arms (e.g., wheelchair or bedside chair)?: A Little Help needed to walk in hospital room?: A Little Help needed climbing 3-5 steps with a railing? : A Little 6 Click Score: 18    End of Session Equipment Utilized During Treatment: Gait belt Activity Tolerance: Patient limited by pain;Other (comment) (lightheadedness) Patient left: in chair;with call bell/phone within reach;with chair alarm set;with family/visitor present;with SCD's reapplied Nurse Communication: Mobility status PT Visit Diagnosis: Other abnormalities of gait and  mobility (R26.89);Muscle weakness (generalized) (M62.81);Pain Pain - Right/Left: Right Pain - part of body: Leg     Time: 0626-9485 PT Time Calculation (min) (ACUTE ONLY): 16 min  Charges:                        Temitope Flammer, SPT  Leoncio Hansen 01/23/2022, 11:22 AM

## 2022-01-23 NOTE — Progress Notes (Signed)
Physical Therapy Treatment Patient Details Name: Lisa [name] MRN: [id number] DOB: 1970-09-09 Today's Date: 01/23/2022   History of Present Illness Pt is a 51 y.o. female s/p L4-S1 decompression (including central laminectomy and B medial facetectomies including foraminotomis) secondary to lumbar stenosis with neurogenic claudication.  PMH includes asthma, PNA, htn, HA's, anxiety, bipolar disorder, schizophrenia, anemia, blood dyscrasia.   PT Comments    Pt agreeable to PT session this PM. Pt received and left supine in bed. Pt performed all bed mobility mod I, no longer requiring assistance to lift bilat LEs to transfer sit to sidelying. Pt was transported in recliner to steps. Pt able to verbally instruct on proper technique for ascending/descending stairs prior to demonstration. Pt completed 4 steps with RW ascending backwards/descending forwards with CGA and +1 for RW stabilization. Pt then completed 16 steps with 1 rail and CGA without rest breaks. Pts demonstration of steps was recorded on patients personal device to show her significant other the technique to use at home. Pt demonstrated safe technique for entering/exiting home.   Recommendations for follow up therapy are one component of a multi-disciplinary discharge planning process, led by the attending physician.  Recommendations may be updated based on patient status, additional functional criteria and insurance authorization.  Follow Up Recommendations  Follow physician's recommendations for discharge plan and follow up therapies Can patient physically be transported by private vehicle: Yes   Assistance Recommended at Discharge Frequent or constant Supervision/Assistance  Patient can return home with the following A little help with walking and/or transfers;A little help with bathing/dressing/bathroom;Assistance with cooking/housework;Assist for transportation;Help with stairs or ramp for entrance   Equipment  Recommendations  Rolling walker (2 wheels);BSC/3in1    Recommendations for Other Services       Precautions / Restrictions Precautions Precautions: Back;Fall Precaution Comments: No brace needed. Restrictions Weight Bearing Restrictions: No     Mobility  Bed Mobility Overal bed mobility: Modified Independent Bed Mobility: Rolling, Sit to Sidelying, Sidelying to Sit Rolling: Modified independent (Device/Increase time) Sidelying to sit: Modified independent (Device/Increase time)     Sit to sidelying: Modified independent (Device/Increase time) General bed mobility comments: Pt able to complete all bed mobility with Mod I for increased time, effort. Pt able to lift her LEs independently from sit to sidelying. Followed all back precautions.    Transfers Overall transfer level: Needs assistance Equipment used: Rolling walker (2 wheels) Transfers: Sit to/from Stand, Bed to chair/wheelchair/BSC Sit to Stand: Min guard   Step pivot transfers: Min guard       General transfer comment: increased time/effort to perform. CGA for safety    Ambulation/Gait Ambulation/Gait assistance: Min guard Gait Distance (Feet): 20 Feet Assistive device: Rolling walker (2 wheels) Gait Pattern/deviations: Step-through pattern, Antalgic Gait velocity: decreased     General Gait Details: Pt ambulated 20 feet to/from chair to steps with continuous antalgic gait pattern.   Stairs Stairs: Yes Stairs assistance: Min guard, +2 safety/equipment Stair Management: One rail Right, Forwards, Backwards, With walker Number of Stairs: 20 General stair comments: Pt able to verbally review proper technique for completing stairs prior to demonstrating. Pt completed 4 steps with RW ascending backward/descending forward with CGA and +1 for RW stabilization. Pt then completed 16 steps with 1 rail and CGA.   Wheelchair Mobility    Modified Rankin (Stroke Patients Only)       Balance Overall balance  assessment: Needs assistance Sitting-balance support: No upper extremity supported, Feet supported Sitting balance-Leahy Scale: Good Sitting balance - Comments:  steady sitting in EOB and in recliner   Standing balance support: Bilateral upper extremity supported, Reliant on assistive device for balance Standing balance-Leahy Scale: Fair Standing balance comment: steady static standing and ambulating with RW                            Cognition Arousal/Alertness: Awake/alert Behavior During Therapy: WFL for tasks assessed/performed Overall Cognitive Status: Within Functional Limits for tasks assessed                                          Exercises      General Comments        Pertinent Vitals/Pain Pain Assessment Pain Assessment: 0-10 Pain Score: 8  Pain Location: back and R LE Pain Descriptors / Indicators: Discomfort, Grimacing, Guarding, Sore, Shooting Pain Intervention(s): Limited activity within patient's tolerance, Monitored during session, Premedicated before session, Repositioned    Home Living                          Prior Function            PT Goals (current goals can now be found in the care plan section) Acute Rehab PT Goals Patient Stated Goal: to go home PT Goal Formulation: With patient Time For Goal Achievement: 02/03/22 Potential to Achieve Goals: Good Progress towards PT goals: Progressing toward goals    Frequency    BID      PT Plan Current plan remains appropriate    Co-evaluation              AM-PAC PT "6 Clicks" Mobility   Outcome Measure  Help needed turning from your back to your side while in a flat bed without using bedrails?: None Help needed moving from lying on your back to sitting on the side of a flat bed without using bedrails?: None Help needed moving to and from a bed to a chair (including a wheelchair)?: A Little Help needed standing up from a chair using your arms  (e.g., wheelchair or bedside chair)?: A Little Help needed to walk in hospital room?: A Little Help needed climbing 3-5 steps with a railing? : A Little 6 Click Score: 20    End of Session Equipment Utilized During Treatment: Gait belt Activity Tolerance: Patient tolerated treatment well;Other (comment) (although has constant pain and lightheadedness) Patient left: in bed;with bed alarm set;with call bell/phone within reach;with SCD's reapplied Nurse Communication: Mobility status PT Visit Diagnosis: Other abnormalities of gait and mobility (R26.89);Muscle weakness (generalized) (M62.81);Pain Pain - Right/Left: Right Pain - part of body: Leg     Time: 7588-3254 PT Time Calculation (min) (ACUTE ONLY): 29 min  Charges:                      Rella Larve, SPT  Kemper Hochman 01/23/2022, 3:11 PM

## 2022-01-23 NOTE — Progress Notes (Signed)
    Attending Progress Note  History: August Gosser is a 51 y.o status post L4-S1 decompression.  POD4: continued right leg pain this morning. Pt states is largely unchanged.   POD3: continued back pain overnight. Pt now complaining of throbbing pain down her right leg. Had a fall yesterday afternoon  POD2: improved left leg pain. Continues back pain and some nausea this morning.   POD1: She complains of back pain and nonpositional headache this morning. She did have some leg pain overnight but seems to be improved from pre-op  Physical Exam: Vitals:   01/23/22 0307 01/23/22 0753  BP: 108/61   Pulse: 92   Resp: 16   Temp: 97.9 F (36.6 C)   SpO2: 96% 99%    AA Ox3 CNI  Strength:4/5 in bilateral HF and KE 5/5 distally. Exam limited due to effort and pain Incision c/d/I with sutures in place  Data:  No results for input(s): "NA", "K", "CL", "CO2", "BUN", "CREATININE", "LABGLOM", "GLUCOSE", "CALCIUM" in the last 168 hours. No results for input(s): "AST", "ALT", "ALKPHOS" in the last 168 hours.  Invalid input(s): "TBILI"   Recent Labs  Lab 01/22/22 0403  WBC 11.3*  HGB 11.8*  HCT 35.7*  PLT 201    Recent Labs  Lab 01/19/22 0905  INR 1.1          Other tests/results: none   Assessment/Plan:  Kristia Jupiter is a 51 year old status post L4-S1 decompression on 01/20/22   - mobilize - pain control - DVT prophylaxis; will discharge home on lovenox bridge per pulm recommendation pre-op - continue steroids. Will d/c home on MDP - PTOT; initially planned for [facility] at d/c.  -Dispo planning underway  Cooper Render PA-C Department of Neurosurgery

## 2022-01-23 NOTE — Plan of Care (Signed)
  Problem: Education: Goal: Knowledge of General Education information will improve Description: Including pain rating scale, medication(s)/side effects and non-pharmacologic comfort measures Outcome: Progressing   Problem: Health Behavior/Discharge Planning: Goal: Ability to manage health-related needs will improve Outcome: Progressing   Problem: Clinical Measurements: Goal: Ability to maintain clinical measurements within normal limits will improve Outcome: Progressing Goal: Will remain free from infection Outcome: Progressing Goal: Diagnostic test results will improve Outcome: Progressing   Problem: Activity: Goal: Risk for activity intolerance will decrease Outcome: Progressing   Problem: Nutrition: Goal: Adequate nutrition will be maintained Outcome: Progressing   Problem: Elimination: Goal: Will not experience complications related to bowel motility Outcome: Progressing   Problem: Pain Managment: Goal: General experience of comfort will improve Outcome: Progressing   Problem: Safety: Goal: Ability to remain free from injury will improve Outcome: Progressing

## 2022-01-23 NOTE — Care Management Important Message (Signed)
Important Message  Patient Details  Name: Lisa [name] MRN: [id number] Date of Birth: [birth year]   Medicare Important Message Given:  N/A - LOS <3 / Initial given by admissions     [name] 01/23/2022, 9:31 AM

## 2022-01-23 NOTE — Progress Notes (Signed)
Occupational Therapy Treatment Patient Details Name: Lisa [name] MRN: [id number] DOB: 17-Mar-1971 Today's Date: 01/23/2022   History of present illness Pt is a 51 y.o. female s/p L4-S1 decompression (including central laminectomy and B medial facetectomies including foraminotomis) secondary to lumbar stenosis with neurogenic claudication.  PMH includes asthma, PNA, htn, HA's, anxiety, bipolar disorder, schizophrenia, anemia, blood dyscrasia.   OT comments  Upon entering the room, pt supine in bed and agreeable to OT intervention. Pt performs bed mobility without physical assistance. Pt discussed home set up and feels confident about discharge home. Pt is able to demonstrate figure four position with increased time this session to don/doff B socks. OT discussed how to utilize this technique to increase LB dressing while maintaining precautions and pt verbalized understanding. Pt stands with supervision and use of RW to ambulate 100' this session before returning to room. Pt does report increased pain of 7/10 in lower back and RN called for pain medication. Pt sitting on EOB with lunch tray and bed alarm activated. All needs within reach.    Recommendations for follow up therapy are one component of a multi-disciplinary discharge planning process, led by the attending physician.  Recommendations may be updated based on patient status, additional functional criteria and insurance authorization.    Follow Up Recommendations  No OT follow up    Assistance Recommended at Discharge Set up Supervision/Assistance  Patient can return home with the following  A little help with walking and/or transfers;Assistance with cooking/housework;A little help with bathing/dressing/bathroom;Help with stairs or ramp for entrance   Equipment Recommendations  BSC/3in1       Precautions / Restrictions Precautions Precautions: Back;Fall Precaution Comments: No brace needed. Restrictions Weight Bearing  Restrictions: No       Mobility Bed Mobility Overal bed mobility: Modified Independent             General bed mobility comments: no physical assistance provided    Transfers Overall transfer level: Needs assistance Equipment used: Rolling walker (2 wheels) Transfers: Sit to/from Stand Sit to Stand: Supervision     Step pivot transfers: Supervision           Balance Overall balance assessment: Needs assistance Sitting-balance support: No upper extremity supported, Feet supported Sitting balance-Leahy Scale: Good Sitting balance - Comments: steady sitting in recliner able to reach outside BoS   Standing balance support: Bilateral upper extremity supported, Reliant on assistive device for balance Standing balance-Leahy Scale: Fair Standing balance comment: steady ambulating with RW. Maintains steady static balance if eyes remain open.                           ADL either performed or assessed with clinical judgement   ADL Overall ADL's : Needs assistance/impaired                     Lower Body Dressing: Supervision/safety Lower Body Dressing Details (indicate cue type and reason): Pt able to demonstrate figure four position to don/doff B socks for LB dressing with min cuing for technique                    Extremity/Trunk Assessment Upper Extremity Assessment Upper Extremity Assessment: Overall WFL for tasks assessed;Generalized weakness   Lower Extremity Assessment Lower Extremity Assessment: Generalized weakness;Overall [facility] for tasks assessed        Vision Patient Visual Report: No change from baseline  Cognition Arousal/Alertness: Awake/alert Behavior During Therapy: WFL for tasks assessed/performed Overall Cognitive Status: Within Functional Limits for tasks assessed                                                     Pertinent Vitals/ Pain       Pain Assessment Pain Assessment:  0-10 Pain Score: 7  Pain Location: back Pain Descriptors / Indicators: Discomfort, Grimacing, Guarding, Sore, Shooting Pain Intervention(s): Limited activity within patient's tolerance, Monitored during session, Patient requesting pain meds-RN notified, Repositioned         Frequency  Min 2X/week        Progress Toward Goals  OT Goals(current goals can now be found in the care plan section)  Progress towards OT goals: Progressing toward goals  Acute Rehab OT Goals Patient Stated Goal: to go home OT Goal Formulation: With patient Time For Goal Achievement: 02/03/22 Potential to Achieve Goals: Good  Plan Discharge plan remains appropriate;Frequency remains appropriate       AM-PAC OT "6 Clicks" Daily Activity     Outcome Measure   Help from another person eating meals?: None Help from another person taking care of personal grooming?: None Help from another person toileting, which includes using toliet, bedpan, or urinal?: A Little Help from another person bathing (including washing, rinsing, drying)?: A Little Help from another person to put on and taking off regular upper body clothing?: None Help from another person to put on and taking off regular lower body clothing?: A Little 6 Click Score: 21    End of Session Equipment Utilized During Treatment: Gait belt;Rolling walker (2 wheels)  OT Visit Diagnosis: Muscle weakness (generalized) (M62.81)   Activity Tolerance Patient tolerated treatment well;Patient limited by pain   Patient Left Other (comment);with call bell/phone within reach;in bed;with bed alarm set (seated EOB with lunch tray in front of her to eat)   Nurse Communication          Time: 1345-1400 OT Time Calculation (min): 15 min  Charges: OT General Charges $OT Visit: 1 Visit OT Treatments $Self Care/Home Management : 8-22 mins Darleen Crocker, MS, OTR/L , CBIS ascom [phone]  01/23/22, 2:42 PM

## 2022-01-24 ENCOUNTER — Other Ambulatory Visit: Payer: Self-pay | Admitting: Neurological Surgery

## 2022-01-24 MED ORDER — OXYCODONE HCL 5 MG PO TABS: 5.0000 mg | ORAL_TABLET | ORAL | 0 refills | Status: DC | PRN

## 2022-01-24 MED ORDER — GABAPENTIN 300 MG PO CAPS: 300.0000 mg | ORAL_CAPSULE | Freq: Three times a day (TID) | ORAL | 0 refills | Status: DC

## 2022-01-24 MED ORDER — PANTOPRAZOLE SODIUM 40 MG PO TBEC: 40.0000 mg | DELAYED_RELEASE_TABLET | ORAL | 11 refills | Status: AC

## 2022-01-24 MED ORDER — SENNA 8.6 MG PO TABS: 1.0000 | ORAL_TABLET | Freq: Two times a day (BID) | ORAL | 0 refills | Status: DC | PRN

## 2022-01-24 MED ORDER — ENOXAPARIN SODIUM 30 MG/0.3ML IJ SOSY
30.0000 mg | PREFILLED_SYRINGE | INTRAMUSCULAR | 0 refills | Status: DC
Start: 2022-01-24 — End: 2022-02-04

## 2022-01-24 MED ORDER — ZOLPIDEM TARTRATE 5 MG PO TABS: 5.0000 mg | ORAL_TABLET | Freq: Every evening | ORAL | 0 refills | Status: DC | PRN

## 2022-01-24 MED ORDER — METHOCARBAMOL 500 MG PO TABS: 500.0000 mg | ORAL_TABLET | Freq: Four times a day (QID) | ORAL | 0 refills | Status: DC

## 2022-01-24 MED ORDER — METHYLPREDNISOLONE 4 MG PO TBPK: ORAL_TABLET | ORAL | 0 refills | Status: DC

## 2022-01-24 NOTE — Plan of Care (Signed)
  Problem: Clinical Measurements: Goal: Ability to maintain clinical measurements within normal limits will improve Outcome: Progressing Goal: Will remain free from infection Outcome: Progressing   Problem: Activity: Goal: Risk for activity intolerance will decrease Outcome: Progressing   Problem: Pain Managment: Goal: General experience of comfort will improve Outcome: Progressing   Problem: Safety: Goal: Ability to remain free from injury will improve Outcome: Progressing   Problem: Education: Goal: Knowledge of General Education information will improve Description: Including pain rating scale, medication(s)/side effects and non-pharmacologic comfort measures Outcome: Adequate for Discharge   Problem: Health Behavior/Discharge Planning: Goal: Ability to manage health-related needs will improve Outcome: Adequate for Discharge   Problem: Nutrition: Goal: Adequate nutrition will be maintained Outcome: Adequate for Discharge   Problem: Elimination: Goal: Will not experience complications related to bowel motility Outcome: Adequate for Discharge Goal: Will not experience complications related to urinary retention Outcome: Adequate for Discharge   Problem: Skin Integrity: Goal: Risk for impaired skin integrity will decrease Outcome: Adequate for Discharge

## 2022-01-24 NOTE — Plan of Care (Signed)

## 2022-01-24 NOTE — Discharge Summary (Signed)
Attending Progress Note   History: Miara Emminger is a 51 y.o status post L4-S1 decompression.  POD5:  Pain better   POD4: continued right leg pain this morning. Pt states is largely unchanged.    POD3: continued back pain overnight. Pt now complaining of throbbing pain down her right leg. Had a fall yesterday afternoon   POD2: improved left leg pain. Continues back pain and some nausea this morning.    POD1: She complains of back pain and nonpositional headache this morning. She did have some leg pain overnight but seems to be improved from pre-op   Physical Exam:     Vitals:    01/23/22 0307 01/23/22 0753  BP: 108/61    Pulse: 92    Resp: 16    Temp: 97.9 F (36.6 C)    SpO2: 96% 99%      AA Ox3 CNI   Strength:4/5 in bilateral HF and KE 5/5 distally. Exam limited due to effort and pain Incision c/d/I with sutures in place   Data:   Last Labs   No results for input(s): "NA", "K", "CL", "CO2", "BUN", "CREATININE", "LABGLOM", "GLUCOSE", "CALCIUM" in the last 168 hours.    Last Labs   No results for input(s): "AST", "ALT", "ALKPHOS" in the last 168 hours.   Invalid input(s): "TBILI"      Last Labs      Recent Labs  Lab 01/22/22 0403  WBC 11.3*  HGB 11.8*  HCT 35.7*  PLT 201       Last Labs      Recent Labs  Lab 01/19/22 0905  INR 1.1                 Other tests/results: none    Assessment/Plan:   Temprance Wyre is a 51 year old status post L4-S1 decompression on 01/20/22    - mobilize - pain control - DVT prophylaxis; will discharge home on lovenox bridge per pulm recommendation pre-op - continue steroids. Will d/c home on MDP - PTOT; initially planned for [facility] at d/c.

## 2022-01-26 ENCOUNTER — Other Ambulatory Visit: Payer: Self-pay | Admitting: Neurosurgery

## 2022-01-26 ENCOUNTER — Telehealth: Payer: Self-pay

## 2022-01-26 MED ORDER — OXYCODONE HCL 5 MG PO TABS
5.0000 mg | ORAL_TABLET | ORAL | 0 refills | Status: DC | PRN
Start: 2022-01-26 — End: 2022-01-29

## 2022-01-26 NOTE — Telephone Encounter (Signed)
Patient notified via voicemail.

## 2022-01-26 NOTE — Telephone Encounter (Signed)
-----   Message from Peggyann Shoals sent at 01/26/2022  8:54 AM EDT ----- Regarding: postop pain Contact: [phone] postop L4-S1 decompression on 01/19/2022 When she was discharged on Saturday, the doctor that discharged her gave her the hardcopy but it was not signed. Oxycodone  [facility] [address]

## 2022-01-29 ENCOUNTER — Other Ambulatory Visit: Payer: Self-pay | Admitting: Neurosurgery

## 2022-01-29 ENCOUNTER — Telehealth: Payer: Self-pay

## 2022-01-29 MED ORDER — GABAPENTIN 300 MG PO CAPS: 300.0000 mg | ORAL_CAPSULE | Freq: Every day | ORAL | 0 refills | Status: DC

## 2022-01-29 MED ORDER — OXYCODONE HCL 5 MG PO TABS: 5.0000 mg | ORAL_TABLET | Freq: Four times a day (QID) | ORAL | 0 refills | Status: DC | PRN

## 2022-01-29 NOTE — Telephone Encounter (Signed)
-----   Message from Peggyann Shoals sent at 01/29/2022 10:06 AM EDT ----- Regarding: post op pain Contact: [phone] L4-S1 decompression on 01/19/2022 Stacy from [place] with patient now, reporting that patient is complaining of 9/10 pain, more on the right side. She is not able to sleep. She is doing everything that she was told to do; moving around, doing the ice pack, and taking medications has instructed. When Marzetta Board was there on Tuesday patient pain was 7/10. Is there anything else you can recommend. Call pt's fiance Antony Contras first if not answer then try her home phone [phone].

## 2022-01-30 ENCOUNTER — Other Ambulatory Visit: Payer: Self-pay | Admitting: Neurosurgery

## 2022-01-30 ENCOUNTER — Other Ambulatory Visit: Payer: Self-pay

## 2022-01-30 ENCOUNTER — Encounter
Admission: EM | Disposition: A | Discharge status: Home / Self Care | Payer: Self-pay | Source: Home / Self Care | Attending: Neurosurgery

## 2022-01-30 ENCOUNTER — Emergency Department: Payer: Medicare Other

## 2022-01-30 ENCOUNTER — Telehealth: Payer: Self-pay | Admitting: Neurosurgery

## 2022-01-30 ENCOUNTER — Encounter: Payer: Medicare Other | Admitting: Neurosurgery

## 2022-01-30 ENCOUNTER — Inpatient Hospital Stay: Payer: Medicare Other | Admitting: Anesthesiology

## 2022-01-30 ENCOUNTER — Inpatient Hospital Stay
Admission: EM | Admit: 2022-01-30 | Discharge: 2022-02-04 | DRG: 908 | Disposition: A | Discharge status: Home / Self Care | Payer: Medicare Other | Attending: Neurosurgery | Admitting: Neurosurgery

## 2022-01-30 DIAGNOSIS — G9762 Postprocedural hematoma of a nervous system organ or structure following other procedure: Principal | ICD-10-CM | POA: Diagnosis present

## 2022-01-30 DIAGNOSIS — G8918 Other acute postprocedural pain: Secondary | ICD-10-CM | POA: Diagnosis present

## 2022-01-30 DIAGNOSIS — Z90710 Acquired absence of both cervix and uterus: Secondary | ICD-10-CM | POA: Diagnosis not present

## 2022-01-30 DIAGNOSIS — K219 Gastro-esophageal reflux disease without esophagitis: Secondary | ICD-10-CM | POA: Diagnosis present

## 2022-01-30 DIAGNOSIS — Z885 Allergy status to narcotic agent status: Secondary | ICD-10-CM

## 2022-01-30 DIAGNOSIS — Z8614 Personal history of Methicillin resistant Staphylococcus aureus infection: Secondary | ICD-10-CM | POA: Diagnosis not present

## 2022-01-30 DIAGNOSIS — Z86711 Personal history of pulmonary embolism: Secondary | ICD-10-CM | POA: Diagnosis not present

## 2022-01-30 DIAGNOSIS — Z9884 Bariatric surgery status: Secondary | ICD-10-CM

## 2022-01-30 DIAGNOSIS — Z87442 Personal history of urinary calculi: Secondary | ICD-10-CM

## 2022-01-30 DIAGNOSIS — Z833 Family history of diabetes mellitus: Secondary | ICD-10-CM | POA: Diagnosis not present

## 2022-01-30 DIAGNOSIS — G9763 Postprocedural seroma of a nervous system organ or structure following a nervous system procedure: Secondary | ICD-10-CM | POA: Diagnosis present

## 2022-01-30 DIAGNOSIS — F319 Bipolar disorder, unspecified: Secondary | ICD-10-CM | POA: Diagnosis present

## 2022-01-30 DIAGNOSIS — Z888 Allergy status to other drugs, medicaments and biological substances status: Secondary | ICD-10-CM

## 2022-01-30 DIAGNOSIS — G834 Cauda equina syndrome: Secondary | ICD-10-CM | POA: Diagnosis present

## 2022-01-30 DIAGNOSIS — I10 Essential (primary) hypertension: Secondary | ICD-10-CM | POA: Diagnosis present

## 2022-01-30 DIAGNOSIS — Y838 Other surgical procedures as the cause of abnormal reaction of the patient, or of later complication, without mention of misadventure at the time of the procedure: Secondary | ICD-10-CM | POA: Diagnosis present

## 2022-01-30 DIAGNOSIS — M5441 Lumbago with sciatica, right side: Secondary | ICD-10-CM

## 2022-01-30 DIAGNOSIS — Z800 Family history of malignant neoplasm of digestive organs: Secondary | ICD-10-CM | POA: Diagnosis not present

## 2022-01-30 DIAGNOSIS — F4310 Post-traumatic stress disorder, unspecified: Secondary | ICD-10-CM | POA: Diagnosis present

## 2022-01-30 DIAGNOSIS — G4733 Obstructive sleep apnea (adult) (pediatric): Secondary | ICD-10-CM | POA: Diagnosis present

## 2022-01-30 DIAGNOSIS — Z801 Family history of malignant neoplasm of trachea, bronchus and lung: Secondary | ICD-10-CM

## 2022-01-30 HISTORY — PX: LUMBAR LAMINECTOMY FOR EPIDURAL ABSCESS: SHX5956

## 2022-01-30 LAB — URINALYSIS, COMPLETE (UACMP) WITH MICROSCOPIC
Bacteria, UA: NONE SEEN
Bilirubin Urine: NEGATIVE
Glucose, UA: NEGATIVE mg/dL
Hgb urine dipstick: NEGATIVE
Ketones, ur: NEGATIVE mg/dL
Leukocytes,Ua: NEGATIVE
Nitrite: NEGATIVE
Protein, ur: NEGATIVE mg/dL
Specific Gravity, Urine: 1.01 (ref 1.005–1.030)
pH: 8 (ref 5.0–8.0)

## 2022-01-30 LAB — CBC WITH DIFFERENTIAL/PLATELET
Abs Immature Granulocytes: 0.11 10*3/uL — ABNORMAL HIGH (ref 0.00–0.07)
Basophils Absolute: 0 10*3/uL (ref 0.0–0.1)
Basophils Relative: 0 %
Eosinophils Absolute: 0.1 10*3/uL (ref 0.0–0.5)
Eosinophils Relative: 0 %
HCT: 37.1 % (ref 36.0–46.0)
Hemoglobin: 12.3 g/dL (ref 12.0–15.0)
Immature Granulocytes: 1 %
Lymphocytes Relative: 11 %
Lymphs Abs: 1.8 10*3/uL (ref 0.7–4.0)
MCH: 29 pg (ref 26.0–34.0)
MCHC: 33.2 g/dL (ref 30.0–36.0)
MCV: 87.5 fL (ref 80.0–100.0)
Monocytes Absolute: 0.9 10*3/uL (ref 0.1–1.0)
Monocytes Relative: 5 %
Neutro Abs: 14 10*3/uL — ABNORMAL HIGH (ref 1.7–7.7)
Neutrophils Relative %: 83 %
Platelets: 291 10*3/uL (ref 150–400)
RBC: 4.24 MIL/uL (ref 3.87–5.11)
RDW: 14.5 % (ref 11.5–15.5)
WBC: 16.8 10*3/uL — ABNORMAL HIGH (ref 4.0–10.5)
nRBC: 0 % (ref 0.0–0.2)

## 2022-01-30 LAB — PROCALCITONIN: Procalcitonin: <0.1 ng/mL

## 2022-01-30 LAB — HCG, QUANTITATIVE, PREGNANCY: hCG, Beta Chain, Quant, S: 1 m[IU]/mL (ref <5)

## 2022-01-30 LAB — BASIC METABOLIC PANEL
Anion gap: 6 (ref 5–15)
BUN: 14 mg/dL (ref 6–20)
CO2: 23 mmol/L (ref 22–32)
Calcium: 8.5 mg/dL — ABNORMAL LOW (ref 8.9–10.3)
Chloride: 106 mmol/L (ref 98–111)
Creatinine, Ser: 0.67 mg/dL (ref 0.44–1.00)
GFR, Estimated: >60 mL/min (ref >60)
Glucose, Bld: 148 mg/dL — ABNORMAL HIGH (ref 70–99)
Potassium: 3.8 mmol/L (ref 3.5–5.1)
Sodium: 135 mmol/L (ref 135–145)

## 2022-01-30 LAB — PROTIME-INR
INR: 1 (ref 0.8–1.2)
Prothrombin Time: 13.5 seconds (ref 11.4–15.2)

## 2022-01-30 LAB — APTT: aPTT: 20 seconds — ABNORMAL LOW (ref 24–36)

## 2022-01-30 SURGERY — LUMBAR LAMINECTOMY FOR EPIDURAL ABSCESS
Anesthesia: General

## 2022-01-30 MED ORDER — FENTANYL CITRATE (PF) 100 MCG/2ML IJ SOLN
INTRAMUSCULAR | Status: AC
  Filled 2022-01-30: qty 2

## 2022-01-30 MED ORDER — SENNA 8.6 MG PO TABS
1.0000 | ORAL_TABLET | Freq: Two times a day (BID) | ORAL | Status: DC | PRN
Start: 2022-01-30 — End: 2022-02-04

## 2022-01-30 MED ORDER — ONDANSETRON HCL 4 MG/2ML IJ SOLN
INTRAMUSCULAR | Status: DC | PRN
  Administered 2022-01-30: 4 mg via INTRAVENOUS

## 2022-01-30 MED ORDER — BUDESONIDE 0.5 MG/2ML IN SUSP
2.0000 mL | Freq: Two times a day (BID) | RESPIRATORY_TRACT | Status: DC
  Administered 2022-01-31 – 2022-02-04 (×9): 0.5 mg via RESPIRATORY_TRACT
  Filled 2022-01-30 (×10): qty 2

## 2022-01-30 MED ORDER — ZOLPIDEM TARTRATE 5 MG PO TABS
5.0000 mg | ORAL_TABLET | Freq: Every evening | ORAL | Status: DC | PRN
Start: 2022-01-30 — End: 2022-02-04
  Administered 2022-02-03: 5 mg via ORAL
  Filled 2022-01-30: qty 1

## 2022-01-30 MED ORDER — GADOBUTROL 1 MMOL/ML IV SOLN
9.0000 mL | Freq: Once | INTRAVENOUS | Status: AC | PRN
  Administered 2022-01-30: 9 mL via INTRAVENOUS

## 2022-01-30 MED ORDER — PANTOPRAZOLE SODIUM 40 MG PO TBEC
40.0000 mg | DELAYED_RELEASE_TABLET | ORAL | Status: DC
  Administered 2022-01-31 – 2022-02-04 (×5): 40 mg via ORAL
  Filled 2022-01-30 (×5): qty 1

## 2022-01-30 MED ORDER — BUPIVACAINE-EPINEPHRINE (PF) 0.5% -1:200000 IJ SOLN
INTRAMUSCULAR | Status: AC
  Filled 2022-01-30: qty 30

## 2022-01-30 MED ORDER — MIDAZOLAM HCL 2 MG/2ML IJ SOLN
INTRAMUSCULAR | Status: DC | PRN
  Administered 2022-01-30: 2 mg via INTRAVENOUS

## 2022-01-30 MED ORDER — CEFAZOLIN SODIUM-DEXTROSE 2-3 GM-%(50ML) IV SOLR
INTRAVENOUS | Status: DC | PRN
  Administered 2022-01-30: 2 g via INTRAVENOUS

## 2022-01-30 MED ORDER — LAMOTRIGINE 25 MG PO TABS
75.0000 mg | ORAL_TABLET | ORAL | Status: DC
  Administered 2022-01-31 – 2022-02-04 (×5): 75 mg via ORAL
  Filled 2022-01-30 (×5): qty 3

## 2022-01-30 MED ORDER — METHYLPREDNISOLONE ACETATE 40 MG/ML IJ SUSP
INTRAMUSCULAR | Status: AC
  Filled 2022-01-30: qty 1

## 2022-01-30 MED ORDER — IPRATROPIUM-ALBUTEROL 20-100 MCG/ACT IN AERS
1.0000 | INHALATION_SPRAY | RESPIRATORY_TRACT | Status: DC
Start: 2022-01-31 — End: 2022-02-04
  Administered 2022-01-31 – 2022-02-04 (×5): 1 via RESPIRATORY_TRACT
  Filled 2022-01-30 (×2): qty 4

## 2022-01-30 MED ORDER — DEXAMETHASONE SODIUM PHOSPHATE 10 MG/ML IJ SOLN
INTRAMUSCULAR | Status: DC | PRN
  Administered 2022-01-30: 10 mg via INTRAVENOUS

## 2022-01-30 MED ORDER — LACTATED RINGERS IV SOLN: INTRAVENOUS | Status: DC | PRN

## 2022-01-30 MED ORDER — PHENYLEPHRINE 80 MCG/ML (10ML) SYRINGE FOR IV PUSH (FOR BLOOD PRESSURE SUPPORT)
PREFILLED_SYRINGE | INTRAVENOUS | Status: DC | PRN
  Administered 2022-01-30: 80 ug via INTRAVENOUS

## 2022-01-30 MED ORDER — MIDAZOLAM HCL 2 MG/2ML IJ SOLN
INTRAMUSCULAR | Status: AC
  Filled 2022-01-30: qty 2

## 2022-01-30 MED ORDER — SODIUM CHLORIDE FLUSH 0.9 % IV SOLN
INTRAVENOUS | Status: AC
  Filled 2022-01-30: qty 20

## 2022-01-30 MED ORDER — ENOXAPARIN SODIUM 30 MG/0.3ML IJ SOSY: 30.0000 mg | PREFILLED_SYRINGE | INTRAMUSCULAR | Status: DC

## 2022-01-30 MED ORDER — BUPIVACAINE HCL (PF) 0.5 % IJ SOLN
INTRAMUSCULAR | Status: AC
  Filled 2022-01-30: qty 30

## 2022-01-30 MED ORDER — LACTATED RINGERS IV BOLUS
1000.0000 mL | Freq: Once | INTRAVENOUS | Status: AC
  Administered 2022-01-30: 1000 mL via INTRAVENOUS

## 2022-01-30 MED ORDER — GABAPENTIN 300 MG PO CAPS
300.0000 mg | ORAL_CAPSULE | Freq: Every day | ORAL | Status: DC
  Administered 2022-01-31 – 2022-02-03 (×4): 300 mg via ORAL
  Filled 2022-01-30 (×4): qty 1

## 2022-01-30 MED ORDER — SURGIPHOR WOUND IRRIGATION SYSTEM - OPTIME
TOPICAL | Status: DC | PRN
  Administered 2022-01-30: 450 mL via TOPICAL

## 2022-01-30 MED ORDER — PROPOFOL 10 MG/ML IV BOLUS
INTRAVENOUS | Status: AC
  Filled 2022-01-30: qty 20

## 2022-01-30 MED ORDER — HYDROMORPHONE HCL 1 MG/ML IJ SOLN
0.5000 mg | Freq: Once | INTRAMUSCULAR | Status: AC
  Administered 2022-01-30: 0.5 mg via INTRAVENOUS
  Filled 2022-01-30: qty 0.5

## 2022-01-30 MED ORDER — METHOCARBAMOL 500 MG PO TABS
500.0000 mg | ORAL_TABLET | Freq: Four times a day (QID) | ORAL | Status: DC
Start: 2022-01-30 — End: 2022-02-04
  Administered 2022-01-31 – 2022-02-04 (×17): 500 mg via ORAL
  Filled 2022-01-30 (×17): qty 1

## 2022-01-30 MED ORDER — FENTANYL CITRATE (PF) 100 MCG/2ML IJ SOLN
INTRAMUSCULAR | Status: DC | PRN
  Administered 2022-01-30 – 2022-01-31 (×3): 50 ug via INTRAVENOUS

## 2022-01-30 MED ORDER — ROCURONIUM BROMIDE 100 MG/10ML IV SOLN
INTRAVENOUS | Status: DC | PRN
  Administered 2022-01-30: 50 mg via INTRAVENOUS
  Administered 2022-01-30: 10 mg via INTRAVENOUS
  Administered 2022-01-30: 40 mg via INTRAVENOUS

## 2022-01-30 MED ORDER — BUPIVACAINE LIPOSOME 1.3 % IJ SUSP
INTRAMUSCULAR | Status: AC
  Filled 2022-01-30: qty 20

## 2022-01-30 MED ORDER — HYDROMORPHONE HCL 1 MG/ML IJ SOLN
1.0000 mg | Freq: Once | INTRAMUSCULAR | Status: AC
  Administered 2022-01-30: 1 mg via INTRAVENOUS
  Filled 2022-01-30: qty 1

## 2022-01-30 MED ORDER — ONDANSETRON HCL 4 MG/2ML IJ SOLN
4.0000 mg | Freq: Once | INTRAMUSCULAR | Status: AC
  Administered 2022-01-30: 4 mg via INTRAVENOUS
  Filled 2022-01-30: qty 2

## 2022-01-30 MED ORDER — GABAPENTIN 300 MG PO CAPS
300.0000 mg | ORAL_CAPSULE | Freq: Three times a day (TID) | ORAL | Status: DC
  Administered 2022-01-31 – 2022-02-04 (×13): 300 mg via ORAL
  Filled 2022-01-30 (×13): qty 1

## 2022-01-30 MED ORDER — LIDOCAINE HCL (CARDIAC) PF 100 MG/5ML IV SOSY
PREFILLED_SYRINGE | INTRAVENOUS | Status: DC | PRN
  Administered 2022-01-30: 100 mg via INTRAVENOUS

## 2022-01-30 MED ORDER — PROPOFOL 10 MG/ML IV BOLUS
INTRAVENOUS | Status: DC | PRN
  Administered 2022-01-30: 150 mg via INTRAVENOUS
  Administered 2022-01-30: 50 mg via INTRAVENOUS

## 2022-01-30 MED ORDER — ADULT MULTIVITAMIN W/MINERALS CH
1.0000 | ORAL_TABLET | Freq: Every day | ORAL | Status: DC
  Administered 2022-01-31 – 2022-02-04 (×5): 1 via ORAL
  Filled 2022-01-30 (×5): qty 1

## 2022-01-30 MED ORDER — ALBUTEROL SULFATE HFA 108 (90 BASE) MCG/ACT IN AERS: 2.0000 | INHALATION_SPRAY | Freq: Four times a day (QID) | RESPIRATORY_TRACT | Status: DC | PRN

## 2022-01-30 MED ORDER — ALBUTEROL SULFATE (2.5 MG/3ML) 0.083% IN NEBU
2.5000 mg | INHALATION_SOLUTION | Freq: Four times a day (QID) | RESPIRATORY_TRACT | Status: DC | PRN
  Administered 2022-01-31 – 2022-02-01 (×2): 2.5 mg via RESPIRATORY_TRACT
  Filled 2022-01-30 (×2): qty 3

## 2022-01-30 MED ORDER — SURGIFLO WITH THROMBIN (HEMOSTATIC MATRIX KIT) OPTIME
TOPICAL | Status: DC | PRN
  Administered 2022-01-30: 1 via TOPICAL

## 2022-01-30 MED ORDER — OXYCODONE HCL 5 MG PO TABS
5.0000 mg | ORAL_TABLET | Freq: Four times a day (QID) | ORAL | 0 refills | Status: DC | PRN
Start: 2022-01-30 — End: 2022-01-30

## 2022-01-30 SURGICAL SUPPLY — 66 items
ADH SKN CLS APL DERMABOND .7 (GAUZE/BANDAGES/DRESSINGS) ×1
AGENT HMST KT MTR STRL THRMB (HEMOSTASIS) ×1
APL PRP STRL LF DISP 70% ISPRP (MISCELLANEOUS)
BASIN KIT SINGLE STR (MISCELLANEOUS) ×1 IMPLANT
BUR NEURO DRILL SOFT 3.0X3.8M (BURR) ×1 IMPLANT
BUR SABER DIAMOND 3.0 (BURR) ×1 IMPLANT
CHLORAPREP W/TINT 26 (MISCELLANEOUS) ×3 IMPLANT
CNTNR SPEC 2.5X3XGRAD LEK (MISCELLANEOUS)
CONT SPEC 4OZ STER OR WHT (MISCELLANEOUS)
CONT SPEC 4OZ STRL OR WHT (MISCELLANEOUS)
CONTAINER SPEC 2.5X3XGRAD LEK (MISCELLANEOUS) ×1 IMPLANT
CORD BIP STRL DISP 12FT (MISCELLANEOUS) ×1 IMPLANT
COUNTER NEEDLE 20/40 LG (NEEDLE) ×3 IMPLANT
CUP MEDICINE 2OZ PLAST GRAD ST (MISCELLANEOUS) ×6 IMPLANT
DERMABOND ADVANCED (GAUZE/BANDAGES/DRESSINGS) ×1
DERMABOND ADVANCED .7 DNX12 (GAUZE/BANDAGES/DRESSINGS) ×1 IMPLANT
DRAPE C ARM PK CFD 31 SPINE (DRAPES) ×1 IMPLANT
DRAPE LAPAROTOMY 100X77 ABD (DRAPES) ×2 IMPLANT
DRAPE MICROSCOPE SPINE 48X150 (DRAPES) ×2 IMPLANT
DRAPE SURG 17X11 SM STRL (DRAPES) ×2 IMPLANT
DRSG OPSITE POSTOP 4X6 (GAUZE/BANDAGES/DRESSINGS) ×1 IMPLANT
ELECT CAUTERY BLADE TIP 2.5 (TIP)
ELECT EZSTD 165MM 6.5IN (MISCELLANEOUS) ×2
ELECT REM PT RETURN 9FT ADLT (ELECTROSURGICAL) ×2
ELECTRODE CAUTERY BLDE TIP 2.5 (TIP) ×2 IMPLANT
ELECTRODE EZSTD 165MM 6.5IN (MISCELLANEOUS) IMPLANT
ELECTRODE REM PT RTRN 9FT ADLT (ELECTROSURGICAL) ×1 IMPLANT
GLOVE BIOGEL PI IND STRL 6.5 (GLOVE) ×1 IMPLANT
GLOVE BIOGEL PI IND STRL 8.5 (GLOVE) ×1 IMPLANT
GLOVE BIOGEL PI INDICATOR 6.5 (GLOVE) ×1
GLOVE BIOGEL PI INDICATOR 8.5 (GLOVE) ×1
GLOVE SURG SYN 6.5 ES PF (GLOVE) ×4 IMPLANT
GLOVE SURG SYN 6.5 PF PI (GLOVE) ×2 IMPLANT
GLOVE SURG SYN 8.5  E (GLOVE) ×6
GLOVE SURG SYN 8.5 E (GLOVE) ×3 IMPLANT
GLOVE SURG SYN 8.5 PF PI (GLOVE) ×3 IMPLANT
GOWN SRG LRG LVL 4 IMPRV REINF (GOWNS) ×1 IMPLANT
GOWN SRG XL LVL 3 NONREINFORCE (GOWNS) ×1 IMPLANT
GOWN STRL NON-REIN TWL XL LVL3 (GOWNS) ×2
GOWN STRL REIN LRG LVL4 (GOWNS) ×2
GRADUATE 1200CC STRL 31836 (MISCELLANEOUS) ×4 IMPLANT
GRAFT DURAGEN MATRIX 1WX1L (Tissue) ×1 IMPLANT
KIT SPINAL PRONEVIEW (KITS) ×2 IMPLANT
MANIFOLD NEPTUNE II (INSTRUMENTS) ×2 IMPLANT
MARKER SKIN DUAL TIP RULER LAB (MISCELLANEOUS) ×5 IMPLANT
NDL SAFETY ECLIPSE 18X1.5 (NEEDLE) ×1 IMPLANT
NEEDLE HYPO 18GX1.5 SHARP (NEEDLE) ×2
NS IRRIG 1000ML POUR BTL (IV SOLUTION) ×2 IMPLANT
PACK LAMINECTOMY NEURO (CUSTOM PROCEDURE TRAY) ×2 IMPLANT
PAD ARMBOARD 7.5X6 YLW CONV (MISCELLANEOUS) ×1 IMPLANT
SOLUTION IRRIG SURGIPHOR (IV SOLUTION) ×2 IMPLANT
SPIKE FLUID TRANSFER (MISCELLANEOUS) ×1 IMPLANT
SURGIFLO W/THROMBIN 8M KIT (HEMOSTASIS) ×2 IMPLANT
SUT DVC VLOC 3-0 CL 6 P-12 (SUTURE) ×2 IMPLANT
SUT ETHILON 3-0 FS-10 30 BLK (SUTURE) ×2
SUT PROLENE 6 0 PC 1 (SUTURE) ×2 IMPLANT
SUT VIC AB 0 CT1 27 (SUTURE) ×4
SUT VIC AB 0 CT1 27XCR 8 STRN (SUTURE) ×1 IMPLANT
SUT VIC AB 2-0 CT1 18 (SUTURE) ×2 IMPLANT
SUTURE EHLN 3-0 FS-10 30 BLK (SUTURE) IMPLANT
SYR 10ML LL (SYRINGE) ×2 IMPLANT
SYR 30ML LL (SYRINGE) ×4 IMPLANT
SYR 3ML LL SCALE MARK (SYRINGE) ×2 IMPLANT
TOWEL OR 17X26 4PK STRL BLUE (TOWEL DISPOSABLE) ×8 IMPLANT
TUBING CONNECTING 10 (TUBING) ×4 IMPLANT
WATER STERILE IRR 1000ML POUR (IV SOLUTION) ×3 IMPLANT

## 2022-01-30 NOTE — Interval H&P Note (Signed)
History and Physical Interval Note:  01/30/2022 9:53 PM  [place]  has presented today for surgery, with the diagnosis of cauda equina syndrome.  The various methods of treatment have been discussed with the patient and family. After consideration of risks, benefits and other options for treatment, the patient has consented to wound exploration and evacuation of epidural fluid collection as a surgical intervention.  The patient's history has been reviewed, patient examined, no change in status, stable for surgery.  I have reviewed the patient's chart and labs.  Questions were answered to the patient's satisfaction.    Heart sounds normal no MRG. Chest Clear to Auscultation Bilaterally.   Caroleena Paolini

## 2022-01-30 NOTE — ED Provider Notes (Signed)
[facility] Provider Note    Event Date/Time   First MD Initiated Contact with Patient 01/30/22 1436     (approximate)   History   Back Pain   HPI  Lisa [name] is a 51 y.o. female with past medical history of arthritis, asthma, bipolar disorder, DVT/PE, GERD, kidney stones, PTSD, pancreatitis, OSA and recent admission discharged on 7/15 s/p L4-S1 decompression who presents for evaluation of worsening pain weakness paresthesias extending from her lumbar region down the right leg.  She states she is taking her pain medications prescribed but they are not helping.  No falls since she was discharged.  No fevers, cough, chest pain, urinary symptoms, diarrhea, constipation or left lower extremity symptoms.  No specific pain in the ankle or knee although she states it radiates from her lower back down the entire backside of her leg.  She has been given herself Lovenox and plans to start Coumadin tomorrow.      Past Medical History:  Diagnosis Date   Abdominal pain    d/t ongoing menstrual bleeding   Anemia    iron deficiency, receiving Feraheme transfusions   Arthritis    Asthma    nonspecific interstitial pneumonia.[place] Kitchendiagnosed 6 years ago   Bipolar 1 disorder ([place])    Blood in stool    happens often but unsure of etiology.  will have colonoscopy   Change in voice    Chest pain    has often but is due to lung disease   Constipation    Depression    Diverticulitis    DVT (deep venous thrombosis) ([place]) 12/13/2014   Dysrhythmia    Fatigue    Gallstones 04/10/2011   with biliary pancreatitis   GERD (gastroesophageal reflux disease)    History of blood transfusion 02/2019   one unit d/t hgb being low   History of hiatal hernia    small   History of kidney stones    History of methicillin resistant staphylococcus aureus (MRSA)    Hypertension    Idiopathic non-specific interstitial pneumonitis (HCC)    Migraines    MRSA carrier    many  years ago.   Nausea    Pancreatitis    Pre-diabetes    PTSD (post-traumatic stress disorder)    Pulmonary embolism ([place]) 12/13/2014   Sleep apnea    does not use cpap now.  will be retested soon   Takes iron supplements    FERAHEME weekly transfusions as well as ferrous sulfate PO   Trouble swallowing    Vocal cord polyps      Physical Exam  Triage Vital Signs: ED Triage Vitals  Enc Vitals Group     BP 01/30/22 1336 107/66     Pulse Rate 01/30/22 1336 67     Resp 01/30/22 1336 17     Temp 01/30/22 1336 98.4 F (36.9 C)     Temp Source 01/30/22 1336 Oral     SpO2 01/30/22 1336 100 %     Weight --      Height --      Head Circumference --      Peak Flow --      Pain Score 01/30/22 1322 10     Pain Loc --      Pain Edu? --      Excl. in [place]? --     Most recent vital signs: Vitals:   01/30/22 1336  BP: 107/66  Pulse: 67  Resp: 17  Temp:  98.4 F (36.9 C)  SpO2: 100%    General: Awake, appears very uncomfortable. CV:  Good peripheral perfusion.  2+ radial pulses.  1+ bilateral DP pulses. Resp:  Normal effort.  Abd:  No distention.  Other:  Patient is very tender or slightly indurated but nonerythematous area of her lumbar spine.  There is no purulence or bleeding.  She is very weak throughout the right lower extremity but is able to move her toes.  She reports sensation is decreased to light touch throughout.  She seems to have full strength of the left lower extremity.   ED Results / Procedures / Treatments  Labs (all labs ordered are listed, but only abnormal results are displayed) Labs Reviewed  CBC WITH DIFFERENTIAL/PLATELET - Abnormal; Notable for the following components:      Result Value   WBC 16.8 (*)    Neutro Abs 14.0 (*)    Abs Immature Granulocytes 0.11 (*)    All other components within normal limits  BASIC METABOLIC PANEL - Abnormal; Notable for the following components:   Glucose, Bld 148 (*)    Calcium 8.5 (*)    All other components within  normal limits  APTT - Abnormal; Notable for the following components:   aPTT 20 (*)    All other components within normal limits  PROTIME-INR  HCG, QUANTITATIVE, PREGNANCY  URINALYSIS, COMPLETE (UACMP) WITH MICROSCOPIC     EKG   RADIOLOGY  X-ray of the L-spine my interpretation without evidence of acute fracture or dislocation.  I also reviewed radiology's interpretation.  MRI L-spine my interpretation without evidence of clear fracture.  I reviewed radiology's interpretation and agree with the findings of patient being status post L5-S1 postlaminectomy with superimposed broad-based disc protrusion, facet arthrosis, and nonenhancing fluid collection at this level extending superiorly into the posterior aspect of the spinal canal to L5.  I agree with notation of fluid collection present at the laminectomy site 2.3 x 1.9 cm contributing to some canal effacement.  I also agree with findings of small bilateral facet effusions and grade 1 anterolisthesis as well as disc bulging and fluid collection at the laminectomy site 4.1 x 1.1 cm.   PROCEDURES:  Critical Care performed: Yes, see critical care procedure note(s)  Procedures    MEDICATIONS ORDERED IN ED: Medications  HYDROmorphone (DILAUDID) injection 1 mg (1 mg Intravenous Given 01/30/22 1546)  ondansetron (ZOFRAN) injection 4 mg (4 mg Intravenous Given 01/30/22 1556)  gadobutrol (GADAVIST) 1 MMOL/ML injection 9 mL (9 mLs Intravenous Contrast Given 01/30/22 1647)  lactated ringers bolus 1,000 mL (1,000 mLs Intravenous New Bag/Given 01/30/22 1723)     IMPRESSION / MDM / ASSESSMENT AND PLAN / ED COURSE  I reviewed the triage vital signs and the nursing notes. Patient's presentation is most consistent with acute presentation with potential threat to life or bodily function.                               Differential diagnosis includes, but is not limited to expected postop pain, epidural abscess, discitis, and hematoma.  BMP with  a glucose of 148 without any other significant derangements.  hCG is negative.  CBC with WBC count of 16.8, normal hemoglobin and platelets.  PTT is 20.  INR is 1.  UA is unremarkable.  X-ray of the L-spine my interpretation without evidence of acute fracture or dislocation.  I also reviewed radiology's interpretation.  MRI L-spine my interpretation  without evidence of clear fracture.  I reviewed radiology's interpretation and agree with the findings of patient being status post L5-S1 postlaminectomy with superimposed broad-based disc protrusion, facet arthrosis, and nonenhancing fluid collection at this level extending superiorly into the posterior aspect of the spinal canal to L5.  I agree with notation of fluid collection present at the laminectomy site 2.3 x 1.9 cm contributing to some canal effacement.  I also agree with findings of small bilateral facet effusions and grade 1 anterolisthesis as well as disc bulging and fluid collection at the laminectomy site 4.1 x 1.1 cm.  Given patient's presentation work-up and the fact she is recent.  I reached out to on-call neurosurgeon and spoke with Dr. Cari Caraway who will come evaluate patient.  After evaluation he will admit to neurosurgery service with plans to go back to the OR.      FINAL CLINICAL IMPRESSION(S) / ED DIAGNOSES   Final diagnoses:  Post-op pain  Acute right-sided low back pain with right-sided sciatica     Rx / DC Orders   ED Discharge Orders     None        Note:  This document was prepared using Dragon voice recognition software and may include unintentional dictation errors.   Lucrezia Starch, MD 01/30/22 Lurena Nida

## 2022-01-30 NOTE — Anesthesia Procedure Notes (Signed)
Procedure Name: Intubation Date/Time: 01/30/2022 10:17 PM  Performed by: Aline Brochure, CRNAPre-anesthesia Checklist: Patient identified, Patient being monitored, Timeout performed, Emergency Drugs available and Suction available Patient Re-evaluated:Patient Re-evaluated prior to induction Oxygen Delivery Method: Circle system utilized Preoxygenation: Pre-oxygenation with 100% oxygen Induction Type: IV induction Ventilation: Mask ventilation without difficulty Laryngoscope Size: 3 and McGraph Grade View: Grade I Tube type: Oral Tube size: 7.0 mm Number of attempts: 1 Airway Equipment and Method: Stylet and Video-laryngoscopy Placement Confirmation: ETT inserted through vocal cords under direct vision, positive ETCO2 and breath sounds checked- equal and bilateral Secured at: 21 cm Tube secured with: Tape Dental Injury: Teeth and Oropharynx as per pre-operative assessment

## 2022-01-30 NOTE — Consult Note (Signed)
Referring Physician:  No referring provider defined for this encounter.  Primary Physician:  [facility]  History of Present Illness: 01/30/2022 Lisa [name] is here today with a chief complaint of worsening pain.  She underwent right-sided L4-5 and L5-S1 decompression on Monday, July 10.  She was discharged home and was working with home health physical therapy.  Over the last 48 hours, she has had worsening pain down her right leg and also developed weakness in her right leg.  She is having trouble walking.  She also reports numbness in her private area particular on the right side.  She is able to urinate, but is developed some increasing effort to do so.  She came back to the emergency department for reevaluation.     Review of Systems:  A 10 point review of systems is negative, except for the pertinent positives and negatives detailed in the HPI.  Past Medical History: Past Medical History:  Diagnosis Date   Abdominal pain    d/t ongoing menstrual bleeding   Anemia    iron deficiency, receiving Feraheme transfusions   Arthritis    Asthma    nonspecific interstitial pneumonia.Marland Kitchendiagnosed 6 years ago   Bipolar 1 disorder ([place])    Blood in stool    happens often but unsure of etiology.  will have colonoscopy   Change in voice    Chest pain    has often but is due to lung disease   Constipation    Depression    Diverticulitis    DVT (deep venous thrombosis) ([place]) 12/13/2014   Dysrhythmia    Fatigue    Gallstones 04/10/2011   with biliary pancreatitis   GERD (gastroesophageal reflux disease)    History of blood transfusion 02/2019   one unit d/t hgb being low   History of hiatal hernia    small   History of kidney stones    History of methicillin resistant staphylococcus aureus (MRSA)    Hypertension    Idiopathic non-specific interstitial pneumonitis (HCC)    Migraines    MRSA carrier    many years ago.   Nausea    Pancreatitis     Pre-diabetes    PTSD (post-traumatic stress disorder)    Pulmonary embolism ([place]) 12/13/2014   Sleep apnea    does not use cpap now.  will be retested soon   Takes iron supplements    FERAHEME weekly transfusions as well as ferrous sulfate PO   Trouble swallowing    Vocal cord polyps     Past Surgical History: Past Surgical History:  Procedure Laterality Date   BRAVO [facility] STUDY  06/02/2011   Procedure: BRAVO [place];  Surgeon: Inda Castle, MD;  Location: [facility];  Service: Endoscopy;  Laterality: N/A;   COLONOSCOPY     CYSTOSCOPY  08/15/2019   Procedure: CYSTOSCOPY;  Surgeon: Malachy Mood, MD;  Location: [facility] ORS;  Service: Gynecology;;   DILATION AND CURETTAGE OF UTERUS     ENDOMETRIAL ABLATION N/A 03/30/2019   Procedure: ENDOMETRIAL ABLATION;  Surgeon: Malachy Mood, MD;  Location: [facility] ORS;  Service: Gynecology;  Laterality: N/A;   ESOPHAGOGASTRODUODENOSCOPY  06/02/2011   Procedure: ESOPHAGOGASTRODUODENOSCOPY (EGD);  Surgeon: Inda Castle, MD;  Location: [facility];  Service: Endoscopy;  Laterality: N/A;   GASTRIC BYPASS  2021   HYSTEROSCOPY  03/30/2019   Procedure: HYSTEROSCOPY;  Surgeon: Malachy Mood, MD;  Location: [facility] ORS;  Service: Gynecology;;   LAPAROSCOPIC CHOLECYSTECTOMY W/ CHOLANGIOGRAPHY  04/25/2011  Dr Margot Chimes   LUMBAR LAMINECTOMY/DECOMPRESSION MICRODISCECTOMY N/A 01/19/2022   Procedure: L4-S1 DECOMPRESSION;  Surgeon: Meade Maw, MD;  Location: [facility] ORS;  Service: Neurosurgery;  Laterality: N/A;   NASAL POLYP SURGERY     polyp from vocal cords   TOTAL LAPAROSCOPIC HYSTERECTOMY WITH SALPINGECTOMY Bilateral 08/15/2019   Procedure: TOTAL LAPAROSCOPIC HYSTERECTOMY WITH SALPINGECTOMY;  Surgeon: Malachy Mood, MD;  Location: [facility] ORS;  Service: Gynecology;  Laterality: Bilateral;   TUBAL LIGATION  1996   WISDOM TOOTH EXTRACTION      Allergies: Allergies as of 01/30/2022 - Review Complete 01/30/2022  Allergen Reaction Noted    Lisinopril Anaphylaxis 08/10/2017   Morphine and related Other (See Comments) 08/04/2019    Medications: No outpatient medications have been marked as taking for the 01/30/22 encounter [facility] Encounter).    Social History: Social History   Tobacco Use   Smoking status: Never   Smokeless tobacco: Never  Vaping Use   Vaping Use: Never used  Substance Use Topics   Alcohol use: Yes    Comment: rare   Drug use: No    Family Medical History: Family History  Problem Relation Age of Onset   Diabetes Mother    Pancreatic cancer Mother    Lung cancer Father    Cancer Paternal Aunt breast   Anesthesia problems Neg Hx    Hypotension Neg Hx    Malignant hyperthermia Neg Hx    Pseudochol deficiency Neg Hx     Physical Examination: Vitals:   01/30/22 1336  BP: 107/66  Pulse: 67  Resp: 17  Temp: 98.4 F (36.9 C)  SpO2: 100%    General: Patient is well developed, well nourished, but in obvious pain.  Attention to examination is appropriate.  Neck:   Supple.  Full range of motion.  Respiratory: Patient is breathing without any difficulty.   NEUROLOGICAL:     Awake, alert, oriented to person, place, and time.  Speech is clear and fluent. Fund of knowledge is appropriate.   Cranial Nerves: Pupils equal round and reactive to light.  Facial tone is symmetric.  Facial sensation is symmetric. Shoulder shrug is symmetric. Tongue protrusion is midline.     Strength: Side Biceps Triceps Deltoid Interossei Grip Wrist Ext. Wrist Flex.  R '5 5 5 5 5 5 5  '$ L '5 5 5 5 5 5 5   '$ Side Iliopsoas Quads Hamstring PF DF EHL  R 5 4+ '4 1 2 3  '$ L '5 5 5 5 5 5   '$ Clonus is not present.  Bilateral upper extremity sensation is intact to light touch.   In her right leg, she has diminished sensation particularly in the L5 and S1 distributions, but also at L4. No evidence of dysmetria noted.  Gait is untested.  Medical Decision Making  Imaging: MRI L spine 01/30/22 IMPRESSION: Comparison is  made to the prior lumbar spine MRI of 11/27/2021.   Lumbar spondylosis, postoperative changes and fluid collections, as detailed and most notably as follows.   At L5-S1, post laminectomy changes are new from the prior MRI. Disc bulge. Superimposed broad-based left center/subarticular disc protrusion (at site of posterior annular fissure). Facet arthrosis (mild right, moderate left). Apparent non-enhancing subdural fluid collection at the disc level and extending more superiorly within the posterior aspect of the spinal canal to the level of the upper L5 vertebral body. A fluid collection is also present at the laminectomy site, measuring 2.3 x 1.9 cm in transaxial dimensions. These factors contribute to severe central  canal and bilateral subarticular stenosis at the disc level, progressed from the prior MRI. Additionally, there is apparent severe effacement of the thecal sac at the L5 vertebral body level due to the subdural fluid collection. Mild left neural foraminal narrowing is unchanged.   At L4-L5, post laminectomy changes are new from the prior MRI. Slight grade 1 anterolisthesis. Disc bulge. Advanced facet arthrosis. Small bilateral facet joint effusions. Fluid collection at the laminectomy site, measuring 4.1 x 1.1 cm in transaxial dimensions. These factors contribute to persistent severe central canal and bilateral subarticular stenosis. Moderate bilateral neural foraminal narrowing is unchanged.   At L3-L4, there is multifactorial mild to moderate bilateral subarticular stenosis, moderate central canal stenosis and mild right neural foraminal narrowing. Findings at this level are unchanged.     Electronically Signed   By: Kellie Simmering D.O.   On: 01/30/2022 17:32  I have personally reviewed the images and agree with the above interpretation.  Assessment and Plan: Lisa [name] is a pleasant 51 y.o. female with worsening symptoms of right-sided lumbar  radiculopathy with some increased effort to urinate, though she is currently able to urinate.  Repeat imaging at the findings above which is concerning for cauda equina compression due to epidural component that is either hematoma, inflammatory fluid collection, CSF collection, seroma, or some combination of the above.  I do not feel that she has an infection, but I am concerned about her progressive progression of symptoms.  Due to this, I recommended proceeding to explore her wound and evacuate what ever is compressing her epidural space.  After reviewing this with her, she agreed with my recommendation.  We reviewed her imaging together.  I discussed the planned procedure at length with the patient, including the risks, benefits, alternatives, and indications. The risks discussed include but are not limited to bleeding, infection, need for reoperation, spinal fluid leak, stroke, vision loss, anesthetic complication, coma, paralysis, and even death.   The patient expressed understanding of these risks, and asked that we proceed with surgery. I recommended that we proceed this evening on an emergency basis.    Ruthvik Barnaby K. Izora Ribas MD, [facility] Neurosurgery

## 2022-01-30 NOTE — Telephone Encounter (Signed)
Dr.Yarbrough asked that I call the patient and offer her an appointment to be worked in today at 12:30pm before he went into surgery.  I called the patient and she agreed to the appointment.  The new office address was confirmed with the patient. Patient no showed visit. Dr.Yarbrough waited until 1pm to see if patient showed up but she never did.

## 2022-01-30 NOTE — ED Triage Notes (Signed)
Pt comes into the ED via EMS from home, pt had L4-S1 decompression surgery on 7/10 and had increased pain in her back and right leg and was unable to do PT  Fentanyl 34mg IV Zofran '4mg'$  IV #[id number] 5034mNS infused [id number] 122/73 prior to meds 100/59 HR94 97%RA

## 2022-01-30 NOTE — Anesthesia Preprocedure Evaluation (Signed)
Anesthesia Evaluation  Patient identified by MRN, date of birth, ID band Patient awake    Reviewed: Allergy & Precautions, NPO status , Patient's Chart, lab work & pertinent test results  History of Anesthesia Complications Negative for: history of anesthetic complications  Airway Mallampati: I   Neck ROM: Full    Dental  (+) Dental Advidsory Given, Poor Dentition   Pulmonary neg shortness of breath, asthma , pneumonia (nonspecific interstitial pneumonia on 2L home O2 at night), neg recent URI,    Pulmonary exam normal breath sounds clear to auscultation       Cardiovascular hypertension, (-) anginaNormal cardiovascular exam Rhythm:Regular Rate:Normal  Hx PE 2016 on warfarin, bridging with lovenox  ECG 01/07/22:  Sinus bradycardia (HR 55) Otherwise normal ECG When compared with ECG of 02-Mar-2019 23:07, No significant change was found   Neuro/Psych  Headaches, PSYCHIATRIC DISORDERS (PTSD) Anxiety Depression Bipolar Disorder Schizophrenia    GI/Hepatic hiatal hernia, GERD  ,S/p gastric bypass   Endo/Other  Obesity   Renal/GU negative Renal ROS     Musculoskeletal   Abdominal   Peds  Hematology  (+) Blood dyscrasia, anemia ,   Anesthesia Other Findings Past Medical History: No date: Abdominal pain     Comment:  d/t ongoing menstrual bleeding No date: Anemia     Comment:  iron deficiency, receiving Feraheme transfusions No date: Arthritis No date: Asthma     Comment:  nonspecific interstitial pneumonia.Marland Kitchendiagnosed 6 years               ago No date: Bipolar 1 disorder ([place]) No date: Blood in stool     Comment:  happens often but unsure of etiology.  will have               colonoscopy No date: Change in voice No date: Chest pain     Comment:  has often but is due to lung disease No date: Constipation No date: Depression No date: Diverticulitis 12/13/2014: DVT (deep venous thrombosis) ([place]) No date:  Dysrhythmia No date: Fatigue 04/10/2011: Gallstones     Comment:  with biliary pancreatitis No date: GERD (gastroesophageal reflux disease) 02/2019: History of blood transfusion     Comment:  one unit d/t hgb being low No date: History of hiatal hernia     Comment:  small No date: History of kidney stones No date: History of methicillin resistant staphylococcus aureus (MRSA) No date: Hypertension No date: Idiopathic non-specific interstitial pneumonitis (HCC) No date: Migraines No date: MRSA carrier     Comment:  many years ago. No date: Nausea No date: Pancreatitis No date: Pre-diabetes No date: PTSD (post-traumatic stress disorder) 12/13/2014: Pulmonary embolism (HCC) No date: Sleep apnea     Comment:  does not use cpap now.  will be retested soon No date: Takes iron supplements     Comment:  FERAHEME weekly transfusions as well as ferrous sulfate               PO No date: Trouble swallowing No date: Vocal cord polyps   Reproductive/Obstetrics                             Anesthesia Physical  Anesthesia Plan  ASA: 3  Anesthesia Plan: General   Post-op Pain Management:    Induction: Intravenous  PONV Risk Score and Plan: 3 and Ondansetron, Dexamethasone and Treatment may vary due to age or medical condition  Airway Management Planned: Oral ETT  Additional Equipment:   Intra-op Plan:   Post-operative Plan: Extubation in OR  Informed Consent: I have reviewed the patients History and Physical, chart, labs and discussed the procedure including the risks, benefits and alternatives for the proposed anesthesia with the patient or authorized representative who has indicated his/her understanding and acceptance.     Dental advisory given  Plan Discussed with: CRNA  Anesthesia Plan Comments: (Patient consented for risks of anesthesia including but not limited to:  - adverse reactions to medications - damage to eyes, teeth, lips or other  oral mucosa - nerve damage due to positioning  - sore throat or hoarseness - damage to heart, brain, nerves, lungs, other parts of body or loss of life  Informed patient about role of CRNA in peri- and intra-operative care.  Patient voiced understanding.)        Anesthesia Quick Evaluation

## 2022-01-30 NOTE — Progress Notes (Signed)
Checked messages

## 2022-01-30 NOTE — Telephone Encounter (Signed)
-----   Message from Peggyann Shoals sent at 01/30/2022  1:32 PM EDT -----  ----- Message ----- From: Peggyann Shoals Sent: 01/30/2022  11:01 AM EDT To: Meade Maw, MD  I called patient to notify her that oxycodone was sent to the pharmacy. She said that this medication is not helping with the "F" pain, why does she need to take it. She can not move when she is having a spasm. She has shooting pain down her right side. All she wants are answers and some one needs to give them to her.  ----- Message ----- From: Rae Lips, CMA Sent: 01/30/2022  10:28 AM EDT To: Peggyann Shoals  Can you please let the patient know that Dr. Izora Ribas has sent in another prescription of Oxycodone.

## 2022-01-30 NOTE — H&P (View-Only) (Signed)
Referring Physician:  No referring provider defined for this encounter.  Primary Physician:  [name]  History of Present Illness: 01/30/2022 [name] is here today with a chief complaint of worsening pain.  She underwent right-sided L4-5 and L5-S1 decompression on Monday, July 10.  She was discharged home and was working with home health physical therapy.  Over the last 48 hours, she has had worsening pain down her right leg and also developed weakness in her right leg.  She is having trouble walking.  She also reports numbness in her private area particular on the right side.  She is able to urinate, but is developed some increasing effort to do so.  She came back to the emergency department for reevaluation.     Review of Systems:  A 10 point review of systems is negative, except for the pertinent positives and negatives detailed in the HPI.  Past Medical History: Past Medical History:  Diagnosis Date   Abdominal pain    d/t ongoing menstrual bleeding   Anemia    iron deficiency, receiving Feraheme transfusions   Arthritis    Asthma    nonspecific interstitial pneumonia.Marland Kitchendiagnosed 6 years ago   Bipolar 1 disorder ([place])    Blood in stool    happens often but unsure of etiology.  will have colonoscopy   Change in voice    Chest pain    has often but is due to lung disease   Constipation    Depression    Diverticulitis    DVT (deep venous thrombosis) ([place]) 12/13/2014   Dysrhythmia    Fatigue    Gallstones 04/10/2011   with biliary pancreatitis   GERD (gastroesophageal reflux disease)    History of blood transfusion 02/2019   one unit d/t hgb being low   History of hiatal hernia    small   History of kidney stones    History of methicillin resistant staphylococcus aureus (MRSA)    Hypertension    Idiopathic non-specific interstitial pneumonitis (HCC)    Migraines    MRSA carrier    many years ago.   Nausea    Pancreatitis     Pre-diabetes    PTSD (post-traumatic stress disorder)    Pulmonary embolism ([place]) 12/13/2014   Sleep apnea    does not use cpap now.  will be retested soon   Takes iron supplements    FERAHEME weekly transfusions as well as ferrous sulfate PO   Trouble swallowing    Vocal cord polyps     Past Surgical History: Past Surgical History:  Procedure Laterality Date   BRAVO [facility] STUDY  06/02/2011   Procedure: BRAVO [place];  Surgeon: Inda Castle, MD;  [facility]: [facility];  Service: Endoscopy;  Laterality: N/A;   COLONOSCOPY     CYSTOSCOPY  08/15/2019   Procedure: CYSTOSCOPY;  Surgeon: Malachy Mood, MD;  Location: [facility] ORS;  Service: Gynecology;;   DILATION AND CURETTAGE OF UTERUS     ENDOMETRIAL ABLATION N/A 03/30/2019   Procedure: ENDOMETRIAL ABLATION;  Surgeon: Malachy Mood, MD;  Location: [facility] ORS;  Service: Gynecology;  Laterality: N/A;   ESOPHAGOGASTRODUODENOSCOPY  06/02/2011   Procedure: ESOPHAGOGASTRODUODENOSCOPY (EGD);  Surgeon: Inda Castle, MD;  Location[facility];  Service: Endoscopy;  Laterality: N/A;   GASTRIC BYPASS  2021   HYSTEROSCOPY  03/30/2019   Procedure: HYSTEROSCOPY;  Surgeon: Malachy Mood, MD;  Location: [facility] ORS;  Service: Gynecology;;   LAPAROSCOPIC CHOLECYSTECTOMY W/ CHOLANGIOGRAPHY  04/25/2011  Dr Margot Chimes   LUMBAR LAMINECTOMY/DECOMPRESSION MICRODISCECTOMY N/A 01/19/2022   Procedure: L4-S1 DECOMPRESSION;  Surgeon: Meade Maw, MD;  Location: [facility] ORS;  Service: Neurosurgery;  Laterality: N/A;   NASAL POLYP SURGERY     polyp from vocal cords   TOTAL LAPAROSCOPIC HYSTERECTOMY WITH SALPINGECTOMY Bilateral 08/15/2019   Procedure: TOTAL LAPAROSCOPIC HYSTERECTOMY WITH SALPINGECTOMY;  Surgeon: Malachy Mood, MD;  Location: [facility] ORS;  Service: Gynecology;  Laterality: Bilateral;   TUBAL LIGATION  1996   WISDOM TOOTH EXTRACTION      Allergies: Allergies as of 01/30/2022 - Review Complete 01/30/2022  Allergen Reaction Noted    Lisinopril Anaphylaxis 08/10/2017   Morphine and related Other (See Comments) 08/04/2019    Medications: No outpatient medications have been marked as taking for the 01/30/22 encounter [facility] Encounter).    Social History: Social History   Tobacco Use   Smoking status: Never   Smokeless tobacco: Never  Vaping Use   Vaping Use: Never used  Substance Use Topics   Alcohol use: Yes    Comment: rare   Drug use: No    Family Medical History: Family History  Problem Relation Age of Onset   Diabetes Mother    Pancreatic cancer Mother    Lung cancer Father    Cancer Paternal Aunt breast   Anesthesia problems Neg Hx    Hypotension Neg Hx    Malignant hyperthermia Neg Hx    Pseudochol deficiency Neg Hx     Physical Examination: Vitals:   01/30/22 1336  BP: 107/66  Pulse: 67  Resp: 17  Temp: 98.4 F (36.9 C)  SpO2: 100%    General: Patient is well developed, well nourished, but in obvious pain.  Attention to examination is appropriate.  Neck:   Supple.  Full range of motion.  Respiratory: Patient is breathing without any difficulty.   NEUROLOGICAL:     Awake, alert, oriented to person, place, and time.  Speech is clear and fluent. Fund of knowledge is appropriate.   Cranial Nerves: Pupils equal round and reactive to light.  Facial tone is symmetric.  Facial sensation is symmetric. Shoulder shrug is symmetric. Tongue protrusion is midline.     Strength: Side Biceps Triceps Deltoid Interossei Grip Wrist Ext. Wrist Flex.  R '5 5 5 5 5 5 5  '$ L '5 5 5 5 5 5 5   '$ Side Iliopsoas Quads Hamstring PF DF EHL  R 5 4+ '4 1 2 3  '$ L '5 5 5 5 5 5   '$ Clonus is not present.  Bilateral upper extremity sensation is intact to light touch.   In her right leg, she has diminished sensation particularly in the L5 and S1 distributions, but also at L4. No evidence of dysmetria noted.  Gait is untested.  Medical Decision Making  Imaging: MRI L spine 01/30/22 IMPRESSION: Comparison is  made to the prior lumbar spine MRI of 11/27/2021.   Lumbar spondylosis, postoperative changes and fluid collections, as detailed and most notably as follows.   At L5-S1, post laminectomy changes are new from the prior MRI. Disc bulge. Superimposed broad-based left center/subarticular disc protrusion (at site of posterior annular fissure). Facet arthrosis (mild right, moderate left). Apparent non-enhancing subdural fluid collection at the disc level and extending more superiorly within the posterior aspect of the spinal canal to the level of the upper L5 vertebral body. A fluid collection is also present at the laminectomy site, measuring 2.3 x 1.9 cm in transaxial dimensions. These factors contribute to severe central  canal and bilateral subarticular stenosis at the disc level, progressed from the prior MRI. Additionally, there is apparent severe effacement of the thecal sac at the L5 vertebral body level due to the subdural fluid collection. Mild left neural foraminal narrowing is unchanged.   At L4-L5, post laminectomy changes are new from the prior MRI. Slight grade 1 anterolisthesis. Disc bulge. Advanced facet arthrosis. Small bilateral facet joint effusions. Fluid collection at the laminectomy site, measuring 4.1 x 1.1 cm in transaxial dimensions. These factors contribute to persistent severe central canal and bilateral subarticular stenosis. Moderate bilateral neural foraminal narrowing is unchanged.   At L3-L4, there is multifactorial mild to moderate bilateral subarticular stenosis, moderate central canal stenosis and mild right neural foraminal narrowing. Findings at this level are unchanged.     Electronically Signed   By: Kellie Simmering D.O.   On: 01/30/2022 17:32  I have personally reviewed the images and agree with the above interpretation.  Assessment and Plan: Lisa [name] is a pleasant 51 y.o. female with worsening symptoms of right-sided lumbar  radiculopathy with some increased effort to urinate, though she is currently able to urinate.  Repeat imaging at the findings above which is concerning for cauda equina compression due to epidural component that is either hematoma, inflammatory fluid collection, CSF collection, seroma, or some combination of the above.  I do not feel that she has an infection, but I am concerned about her progressive progression of symptoms.  Due to this, I recommended proceeding to explore her wound and evacuate what ever is compressing her epidural space.  After reviewing this with her, she agreed with my recommendation.  We reviewed her imaging together.  I discussed the planned procedure at length with the patient, including the risks, benefits, alternatives, and indications. The risks discussed include but are not limited to bleeding, infection, need for reoperation, spinal fluid leak, stroke, vision loss, anesthetic complication, coma, paralysis, and even death.   The patient expressed understanding of these risks, and asked that we proceed with surgery. I recommended that we proceed this evening on an emergency basis.    Lisa [name] K. Izora Ribas MD, [facility] Neurosurgery

## 2022-01-30 NOTE — Progress Notes (Signed)
Patient transferred to OR

## 2022-01-31 ENCOUNTER — Other Ambulatory Visit: Payer: Self-pay

## 2022-01-31 LAB — CREATININE, SERUM
Creatinine, Ser: 0.59 mg/dL (ref 0.44–1.00)
GFR, Estimated: >60 mL/min (ref >60)

## 2022-01-31 LAB — CBC
HCT: 35.5 % — ABNORMAL LOW (ref 36.0–46.0)
Hemoglobin: 12.1 g/dL (ref 12.0–15.0)
MCH: 29.2 pg (ref 26.0–34.0)
MCHC: 34.1 g/dL (ref 30.0–36.0)
MCV: 85.5 fL (ref 80.0–100.0)
Platelets: 228 10*3/uL (ref 150–400)
RBC: 4.15 MIL/uL (ref 3.87–5.11)
RDW: 14.4 % (ref 11.5–15.5)
WBC: 18.7 10*3/uL — ABNORMAL HIGH (ref 4.0–10.5)
nRBC: 0 % (ref 0.0–0.2)

## 2022-01-31 MED ORDER — HYDROMORPHONE HCL 1 MG/ML IJ SOLN
0.5000 mg | INTRAMUSCULAR | Status: AC | PRN
  Administered 2022-01-31 (×3): 0.5 mg via INTRAVENOUS
  Filled 2022-01-31 (×3): qty 1

## 2022-01-31 MED ORDER — ONDANSETRON HCL 4 MG/2ML IJ SOLN: 4.0000 mg | Freq: Four times a day (QID) | INTRAMUSCULAR | Status: DC | PRN

## 2022-01-31 MED ORDER — SODIUM CHLORIDE 0.9% FLUSH
3.0000 mL | Freq: Two times a day (BID) | INTRAVENOUS | Status: DC
  Administered 2022-01-31 – 2022-02-03 (×7): 3 mL via INTRAVENOUS

## 2022-01-31 MED ORDER — ENOXAPARIN SODIUM 40 MG/0.4ML IJ SOSY
40.0000 mg | PREFILLED_SYRINGE | INTRAMUSCULAR | Status: DC
  Administered 2022-02-01 – 2022-02-04 (×4): 40 mg via SUBCUTANEOUS
  Filled 2022-01-31 (×4): qty 0.4

## 2022-01-31 MED ORDER — FENTANYL CITRATE (PF) 100 MCG/2ML IJ SOLN
50.0000 ug | Freq: Once | INTRAMUSCULAR | Status: AC
  Administered 2022-01-31: 50 ug via INTRAVENOUS

## 2022-01-31 MED ORDER — FENTANYL CITRATE (PF) 100 MCG/2ML IJ SOLN
INTRAMUSCULAR | Status: AC
  Filled 2022-01-31: qty 2

## 2022-01-31 MED ORDER — KETOROLAC TROMETHAMINE 15 MG/ML IJ SOLN
INTRAMUSCULAR | Status: AC
  Administered 2022-01-31: 15 mg via INTRAVENOUS
  Filled 2022-01-31: qty 1

## 2022-01-31 MED ORDER — POTASSIUM CHLORIDE IN NACL 20-0.9 MEQ/L-% IV SOLN
INTRAVENOUS | Status: DC
Start: 2022-01-31 — End: 2022-02-04
  Filled 2022-01-31 (×9): qty 1000

## 2022-01-31 MED ORDER — KETOROLAC TROMETHAMINE 15 MG/ML IJ SOLN
15.0000 mg | Freq: Four times a day (QID) | INTRAMUSCULAR | Status: AC
  Administered 2022-01-31 (×3): 15 mg via INTRAVENOUS
  Filled 2022-01-31 (×3): qty 1

## 2022-01-31 MED ORDER — OXYCODONE HCL 5 MG PO TABS
10.0000 mg | ORAL_TABLET | ORAL | Status: DC | PRN
  Administered 2022-01-31 – 2022-02-04 (×20): 10 mg via ORAL
  Filled 2022-01-31 (×20): qty 2

## 2022-01-31 MED ORDER — FENTANYL CITRATE (PF) 100 MCG/2ML IJ SOLN
25.0000 ug | INTRAMUSCULAR | Status: DC | PRN
  Administered 2022-01-31: 50 ug via INTRAVENOUS

## 2022-01-31 MED ORDER — SODIUM CHLORIDE 0.9 % IV SOLN: 250.0000 mL | INTRAVENOUS | Status: DC

## 2022-01-31 MED ORDER — MENTHOL 3 MG MT LOZG: 1.0000 | LOZENGE | OROMUCOSAL | Status: DC | PRN

## 2022-01-31 MED ORDER — PROMETHAZINE HCL 25 MG/ML IJ SOLN: 6.2500 mg | INTRAMUSCULAR | Status: DC | PRN

## 2022-01-31 MED ORDER — OXYCODONE HCL 5 MG PO TABS: 5.0000 mg | ORAL_TABLET | ORAL | Status: DC | PRN

## 2022-01-31 MED ORDER — FENTANYL CITRATE (PF) 100 MCG/2ML IJ SOLN
INTRAMUSCULAR | Status: AC
  Administered 2022-01-31: 50 ug via INTRAVENOUS
  Filled 2022-01-31: qty 2

## 2022-01-31 MED ORDER — PHENOL 1.4 % MT LIQD: 1.0000 | OROMUCOSAL | Status: DC | PRN

## 2022-01-31 MED ORDER — SODIUM CHLORIDE 0.9% FLUSH
3.0000 mL | INTRAVENOUS | Status: DC | PRN
Start: 2022-01-31 — End: 2022-02-04

## 2022-01-31 MED ORDER — ONDANSETRON HCL 4 MG PO TABS: 4.0000 mg | ORAL_TABLET | Freq: Four times a day (QID) | ORAL | Status: DC | PRN

## 2022-01-31 MED ORDER — SUGAMMADEX SODIUM 200 MG/2ML IV SOLN
INTRAVENOUS | Status: DC | PRN
  Administered 2022-01-31: 200 mg via INTRAVENOUS

## 2022-01-31 NOTE — Plan of Care (Signed)

## 2022-01-31 NOTE — Transfer of Care (Signed)
Immediate Anesthesia Transfer of Care Note  Patient: Lisa [name]  Procedure(s) Performed: LUMBAR LAMINECTOMY FOR EPIDURAL ABSCESS  Patient Location: PACU  Anesthesia Type:General  Level of Consciousness: awake  Airway & Oxygen Therapy: Patient Spontanous Breathing and Patient connected to face mask oxygen  Post-op Assessment: Report given to RN and Post -op Vital signs reviewed and stable  Post vital signs: Reviewed and stable  Last Vitals:  Vitals Value Taken Time  BP 126/89 01/31/22 0038  Temp 36.8 C 01/31/22 0038  Pulse 85 01/31/22 0039  Resp 13 01/31/22 0039  SpO2 100 % 01/31/22 0039  Vitals shown include unvalidated device data.  Last Pain:  Vitals:   01/30/22 2025  TempSrc: Oral  PainSc: 8          Complications: No notable events documented.

## 2022-01-31 NOTE — Evaluation (Signed)
Occupational Therapy Evaluation Patient Details Name: Lisa [name] MRN: [id number] DOB: 1971-06-21 Today's Date: 01/31/2022   History of Present Illness Pt is a 51 year old female admitted with worsening symptoms of right-sided lumbar radiculopathy with some increased effort to urinate. Repeat imaging at the findings above which is concerning for cauda equina compression. Pt is s/pL4-5 and L5-S1 decompression on Monday, July 10. Pt is now s/p exploration and evauation of seroma and hematoma 01/30/22. PMH significant for asthma, PNA, htn, HA's, anxiety, bipolar disorder, schizophrenia, anemia, blood dyscrasia.   Clinical Impression   Chart reviewed, RN cleared pt for participation in OT evaluation. Pt is alert and oriented x4, demonstrates good adherence to precautions during mobility. PTA pt was working with HHPT, amb with RW and assist with LB dressing as needed from fiance. Pt performs bed mobility with supervision, STS with supervision with RW, amb to bathroom with RW with supervision, toilet transfer with supervision. Pt is limited by decreased endurance and pain on this date. OT will follow acutely to address functional deficits,no OT needs anticipated following discharge. Pt is left as received, NAD, all needs met.      Recommendations for follow up therapy are one component of a multi-disciplinary discharge planning process, led by the attending physician.  Recommendations may be updated based on patient status, additional functional criteria and insurance authorization.   Follow Up Recommendations  No OT follow up    Assistance Recommended at Discharge Intermittent Supervision/Assistance  Patient can return home with the following A little help with walking and/or transfers;Assistance with cooking/housework;A little help with bathing/dressing/bathroom;Help with stairs or ramp for entrance    Functional Status Assessment  Patient has had a recent decline in their functional  status and demonstrates the ability to make significant improvements in function in a reasonable and predictable amount of time.  Equipment Recommendations  None recommended by OT;Other (comment) (pt has bsc)    Recommendations for Other Services       Precautions / Restrictions Precautions Precautions: Back;Fall Precaution Comments: No brace needed. Restrictions Weight Bearing Restrictions: No      Mobility Bed Mobility Overal bed mobility: Needs Assistance Bed Mobility: Sidelying to Sit, Sit to Sidelying   Sidelying to sit: Supervision, HOB elevated     Sit to sidelying: Supervision, HOB elevated      Transfers Overall transfer level: Needs assistance Equipment used: Rolling walker (2 wheels) Transfers: Sit to/from Stand Sit to Stand: Supervision                  Balance Overall balance assessment: Needs assistance Sitting-balance support: No upper extremity supported, Feet supported       Standing balance support: Bilateral upper extremity supported, Reliant on assistive device for balance Standing balance-Leahy Scale: Fair                             ADL either performed or assessed with clinical judgement   ADL Overall ADL's : Needs assistance/impaired                         Toilet Transfer: Supervision/safety;Ambulation;Rolling walker (2 wheels)           Functional mobility during ADLs: Supervision/safety;Rolling walker (2 wheels)       Vision Patient Visual Report: No change from baseline       Perception     Praxis      Pertinent Vitals/Pain Pain  Assessment Pain Assessment: 0-10 Pain Score: 10-Worst pain ever Pain Location: back Pain Descriptors / Indicators: Discomfort, Grimacing, Guarding, Sore, Shooting Pain Intervention(s): Limited activity within patient's tolerance, Monitored during session, Premedicated before session, Repositioned     Hand Dominance Right   Extremity/Trunk Assessment Upper  Extremity Assessment Upper Extremity Assessment: Overall WFL for tasks assessed   Lower Extremity Assessment Lower Extremity Assessment: Generalized weakness;LLE deficits/detail;RLE deficits/detail RLE Deficits / Details: diminished sensation throughout RLE, pt reports improvements since sx   Cervical / Trunk Assessment Cervical / Trunk Assessment: Normal   Communication Communication Communication: No difficulties   Cognition Arousal/Alertness: Awake/alert Behavior During Therapy: WFL for tasks assessed/performed Overall Cognitive Status: Within Functional Limits for tasks assessed                                       General Comments       Exercises Other Exercises Other Exercises: edu re: use of DME at home, falls prevention, home safety   Shoulder Instructions      Home Living Family/patient expects to be discharged to:: Private residence Living Arrangements: Children;Non-relatives/Friends Available Help at Discharge: Family;Available 24 hours/day Type of Home: Apartment Home Access: Stairs to enter Entrance Stairs-Number of Steps: 2 Entrance Stairs-Rails: None Home Layout: Two level Alternate Level Stairs-Number of Steps: 15 Alternate Level Stairs-Rails: Right Bathroom Shower/Tub: Teacher, early years/pre: Standard     Home Equipment: Public relations account executive (2 wheels)   Additional Comments: from previous sx      Prior Functioning/Environment Prior Level of Function : Independent/Modified Independent;Driving             Mobility Comments: amb with RW after surgery, typically no use of AD prior to first surgery ADLs Comments: indep prior to prior surgery, assist for LB dressing from fiance PTA        OT Problem List: Decreased strength;Decreased range of motion;Decreased activity tolerance;Impaired balance (sitting and/or standing)      OT Treatment/Interventions: Self-care/ADL training;Therapeutic exercise;DME and/or AE  instruction;Energy conservation;Therapeutic activities;Patient/family education;Balance training    OT Goals(Current goals can be found in the care plan section) Acute Rehab OT Goals Patient Stated Goal: go home OT Goal Formulation: With patient Time For Goal Achievement: 02/14/22 Potential to Achieve Goals: Good ADL Goals Pt Will Perform Grooming: with modified independence;standing Pt Will Perform Toileting - Clothing Manipulation and hygiene: with modified independence;sit to/from stand Pt Will Perform Tub/Shower Transfer: ambulating;with modified independence  OT Frequency: Min 2X/week    Co-evaluation              AM-PAC OT "6 Clicks" Daily Activity     Outcome Measure Help from another person eating meals?: None Help from another person taking care of personal grooming?: None Help from another person toileting, which includes using toliet, bedpan, or urinal?: A Little Help from another person bathing (including washing, rinsing, drying)?: A Little Help from another person to put on and taking off regular upper body clothing?: None Help from another person to put on and taking off regular lower body clothing?: A Little 6 Click Score: 21   End of Session Nurse Communication: Mobility status  Activity Tolerance: Patient tolerated treatment well Patient left: in bed;with call bell/phone within reach;with bed alarm set  OT Visit Diagnosis: Muscle weakness (generalized) (M62.81)                Time: 4540-9811 OT Time Calculation (  min): 15 min Charges:  OT General Charges $OT Visit: 1 Visit OT Evaluation $OT Eval Low Complexity: 1 Low  Shanon Payor, OTD OTR/L  01/31/22, 2:55 PM

## 2022-01-31 NOTE — Progress Notes (Signed)
    Attending Progress Note  History: Lisa [name] is here for cauda equina compression.  She underwent her index surgery on January 19, 2022.  POD1: Doing much better.  Pain improved  Physical Exam: Vitals:   01/31/22 0615 01/31/22 0734  BP:  (!) 112/55  Pulse:  89  Resp:  16  Temp:  98.5 F (36.9 C)  SpO2: 100% 100%    AA Ox3 CNI  Strength:5/5 throughout BLE with exception of R IP and DF, 4/5 Sensation is slightly diminished but much improved  Data:  Recent Labs  Lab 01/30/22 1554 01/31/22 1020  NA 135  --   K 3.8  --   CL 106  --   CO2 23  --   BUN 14  --   CREATININE 0.67 0.59  GLUCOSE 148*  --   CALCIUM 8.5*  --    No results for input(s): "AST", "ALT", "ALKPHOS" in the last 168 hours.  Invalid input(s): "TBILI"   Recent Labs  Lab 01/30/22 1554 01/31/22 1020  WBC 16.8* 18.7*  HGB 12.3 12.1  HCT 37.1 35.5*  PLT 291 228   Recent Labs  Lab 01/30/22 1554  APTT 20*  INR 1.0         Other tests/results: n/a  Assessment/Plan:  Lisa [name] is improved after exploration and evacuation of epidural collection.  - mobilize today - pain control - DVT prophylaxis - PTOT today   Meade Maw MD, [facility] Department of Neurosurgery

## 2022-01-31 NOTE — Plan of Care (Signed)

## 2022-01-31 NOTE — Evaluation (Signed)
Physical Therapy Evaluation Patient Details Name: Lisa [name] MRN: [id number] DOB: 1970/10/25 Today's Date: 01/31/2022  History of Present Illness  Pt is a 51 year old female admitted with worsening symptoms of right-sided lumbar radiculopathy with some increased effort to urinate. Repeat imaging at the findings above which is concerning for cauda equina compression. Pt is s/pL4-5 and L5-S1 decompression on Monday, July 10. Pt is now s/p exploration and evauation of seroma and hematoma 01/30/22. PMH significant for asthma, PNA, htn, HA's, anxiety, bipolar disorder, schizophrenia, anemia, blood dyscrasia.   Clinical Impression  Patient received in bed and is agreeable to PT eval, although she states she was about to go to sleep. She reports 10/10 pain in her low back and right glute and that she just received muscle relaxer and gabapentin. Patient drowsy but appears oriented x4. She reports she lives with her family in a 2 story home with 3 steps to enter with no handrail and 15 steps with R handrail to the second floor where her bed is. She states there is no good place for her to sleep on the first floor. Prior to to her first back surgery on 01/19/22 she was I with ambulation and ADLs/IADLs. Since that surgery she has been ambulating with RW and I with ADLs except LE dressing for which she has had help. Upon PT evaluation, patient was mod I with bed mobility with head of bed elevated. She needed supervision for transfers from slightly elevated bed (per her request) and supervision for ambulation ~ 150 feet with RW. She remembered her precautions well and reminded herself to stand up tall and keep eyes open. However, she was very slow with ambulation and reported feeling lightheaded with standing and walking activities. Stairs deferred due to drowsiness, lightheadedness, and pain. Patient is expected to improve rapidly in functional mobility once she has less pain and drowsiness and will likely  be able to progress to navigating stairs tomorrow. Currently recommend discharge home with HHPT and no new DME. Patient would benefit from skilled physical therapy to address impairments and functional limitations (see PT Problem List below) to work towards stated goals and return to PLOF or maximal functional independence.     Recommendations for follow up therapy are one component of a multi-disciplinary discharge planning process, led by the attending physician.  Recommendations may be updated based on patient status, additional functional criteria and insurance authorization.  Follow Up Recommendations Home health PT Can patient physically be transported by private vehicle: Yes    Assistance Recommended at Discharge Intermittent Supervision/Assistance  Patient can return home with the following  A little help with walking and/or transfers;A little help with bathing/dressing/bathroom;Assistance with cooking/housework;Assist for transportation;Help with stairs or ramp for entrance    Equipment Recommendations None recommended by PT  Recommendations for Other Services       Functional Status Assessment Patient has had a recent decline in their functional status and demonstrates the ability to make significant improvements in function in a reasonable and predictable amount of time.     Precautions / Restrictions Precautions Precautions: Back;Fall Precaution Comments: No brace needed. Restrictions Weight Bearing Restrictions: No      Mobility  Bed Mobility Overal bed mobility: Needs Assistance Bed Mobility: Sit to Sidelying, Sidelying to Sit, Rolling Rolling: Modified independent (Device/Increase time) Sidelying to sit: HOB elevated, Modified independent (Device/Increase time)     Sit to sidelying: HOB elevated, Modified independent (Device/Increase time) General bed mobility comments: very slow and effortful/painful  Transfers Overall transfer level: Needs  assistance Equipment used: Rolling walker (2 wheels) Transfers: Sit to/from Stand Sit to Stand: Supervision, From elevated surface           General transfer comment: increased time/effort/pain. Patient requested elevated bed a few inches prior to attempting stand. Reports dizziness upon standing that improves slightly but does not resolve.    Ambulation/Gait Ambulation/Gait assistance: Supervision Gait Distance (Feet): 150 Feet Assistive device: Rolling walker (2 wheels) Gait Pattern/deviations: Step-through pattern, Decreased stride length Gait velocity: slow     General Gait Details: Patient ambulated ~ 150 feet with RW and SBA. Occasionally stopped and verbally reminded herself to open her eyes and stand up tall before continuing. Appears very sleepy. Reports dizziness that does not improve or worsen.  Stairs         General stair comments: Patient declined attempting stairs today and PT agrees due to drowsiness and dizziness.  Wheelchair Mobility    Modified Rankin (Stroke Patients Only)       Balance Overall balance assessment: Needs assistance Sitting-balance support: No upper extremity supported, Feet supported Sitting balance-Leahy Scale: Good     Standing balance support: Bilateral upper extremity supported, Reliant on assistive device for balance Standing balance-Leahy Scale: Fair Standing balance comment: steady ambulating with RW                             Pertinent Vitals/Pain Pain Assessment Pain Assessment: 0-10 Pain Score: 10-Worst pain ever Pain Location: back to right glute, states better than prior to surgery today Pain Descriptors / Indicators: Grimacing, Guarding, Shooting Pain Intervention(s): Limited activity within patient's tolerance, Monitored during session, Premedicated before session    Home Living Family/patient expects to be discharged to:: Private residence Living Arrangements:  Children;Non-relatives/Friends Available Help at Discharge: Family;Available 24 hours/day Type of Home: Apartment Home Access: Stairs to enter Entrance Stairs-Rails: None Entrance Stairs-Number of Steps: 2 Alternate Level Stairs-Number of Steps: 15 Home Layout: Two level;Bed/bath upstairs Home Equipment: BSC/3in1;Rolling Walker (2 wheels) Additional Comments: from previous sx    Prior Function Prior Level of Function : Independent/Modified Independent;Driving             Mobility Comments: amb with RW after surgery, typically no use of AD prior to first surgery ADLs Comments: indep prior to prior surgery, assist for LB dressing from fiance PTA     Hand Dominance   Dominant Hand: Right    Extremity/Trunk Assessment   Upper Extremity Assessment Upper Extremity Assessment: Overall WFL for tasks assessed    Lower Extremity Assessment Lower Extremity Assessment: Generalized weakness;LLE deficits/detail;RLE deficits/detail RLE Deficits / Details: diminished sensation throughout R ankle compared to left, R ankle and great toe weaker than left. LLE Deficits / Details: L ankle DF approx 4/5 givingway weakness, with MMT    Cervical / Trunk Assessment Cervical / Trunk Assessment: Normal  Communication   Communication: No difficulties  Cognition Arousal/Alertness: Awake/alert Behavior During Therapy: WFL for tasks assessed/performed Overall Cognitive Status: Within Functional Limits for tasks assessed                                          General Comments General comments (skin integrity, edema, etc.): reports lightheadedness in sititng and standing and appears drowsy. Lightheadedness does not improve or worsen with ambulation.    Exercises Other Exercises Other Exercises: educated  on discharge reccomendations   Assessment/Plan    PT Assessment Patient needs continued PT services  PT Problem List Decreased strength;Decreased activity  tolerance;Decreased balance;Decreased mobility;Decreased knowledge of use of DME;Decreased knowledge of precautions;Pain;Decreased skin integrity       PT Treatment Interventions DME instruction;Gait training;Stair training;Functional mobility training;Therapeutic activities;Therapeutic exercise;Balance training;Patient/family education;Neuromuscular re-education    PT Goals (Current goals can be found in the Care Plan section)  Acute Rehab PT Goals Patient Stated Goal: to go home PT Goal Formulation: With patient Time For Goal Achievement: 02/14/22 Potential to Achieve Goals: Good    Frequency BID     Co-evaluation               AM-PAC PT "6 Clicks" Mobility  Outcome Measure Help needed turning from your back to your side while in a flat bed without using bedrails?: A Little Help needed moving from lying on your back to sitting on the side of a flat bed without using bedrails?: A Little Help needed moving to and from a bed to a chair (including a wheelchair)?: A Little Help needed standing up from a chair using your arms (e.g., wheelchair or bedside chair)?: A Little Help needed to walk in hospital room?: A Little Help needed climbing 3-5 steps with a railing? : Total 6 Click Score: 16    End of Session   Activity Tolerance: Patient limited by lethargy;Patient limited by pain (patient limited by pain, lightheadedness, and drowsiness) Patient left: in bed;with bed alarm set;with call bell/phone within reach;with SCD's reapplied Nurse Communication: Mobility status PT Visit Diagnosis: Other abnormalities of gait and mobility (R26.89);Muscle weakness (generalized) (M62.81);Pain Pain - Right/Left: Right Pain - part of body: Leg    Time: 3474-2595 PT Time Calculation (min) (ACUTE ONLY): 19 min   Charges:   PT Evaluation $PT Eval Low Complexity: 1 Low         Jerilee Space R. Graylon Good, PT, DPT 01/31/22, 4:39 PM

## 2022-01-31 NOTE — Op Note (Signed)
Indications: Lisa [name] is a 51 yo female who presented with worsening R leg weakness and urinary hesitancy concerning for cauda equina syndrome.  Imaging was worrisome for cauda equina compression, so exploration was recommended.  Please note that she had surgery previously on January 19, 2022.  Findings: compressive seroma/hematoma at L4-5  Preoperative Diagnosis: cauda equina syndrome/compression Postoperative Diagnosis: same   EBL: 10 ml IVF: see AR ml Drains: none Disposition: Extubated and Stable to PACU Complications: none  No foley catheter was placed.   Preoperative Note:   Risks of surgery discussed include: infection, bleeding, stroke, coma, death, paralysis, CSF leak, nerve/spinal cord injury, numbness, tingling, weakness, complex regional pain syndrome, recurrent stenosis and/or disc herniation, vascular injury, development of instability, neck/back pain, need for further surgery, persistent symptoms, development of deformity, and the risks of anesthesia. The patient understood these risks and agreed to proceed.  Operative Note:   1. Exploration of prior lumbar wound for evacuatin of epidural compressive fluid collection  The patient was then brought from the preoperative center with intravenous access established.  The patient underwent general anesthesia and endotracheal tube intubation, and was then rotated on the [place] rail top where all pressure points were appropriately padded.  The prior incision was identified and stitches removed. The skin was then thoroughly cleansed.  Perioperative antibiotic prophylaxis was administered.  Sterile prep and drapes were then applied and a timeout was then observed.  The prior incision was opened.  The stitches were removed.  The fascial opening was identified and the tube tracts noted at L4-5 and L5-S1 from the prior surgeries.  Each was inspected.    The metrx tubes were sequentially advanced and confirmed in position at  L5-S1. An 100m by 659mtube was locked in place to the bed side attachment.  The microscope was then sterilely brought into the field. No compression or bleeding was noted.  The area was irrigated, then the tubes removed.    We then advanced the tubes in at L4/5, where a combination of seroma and hematoma seemed to be compressive the thecal sac.  This was carefully removed.  The durotomy at L4-5 had enlarged and some nerve rootlets were herniating.  These were carefully reduced and a stitch placed to reapproximate the dura.  Duragen was then placed over the defect.   The tube system was then removed under microscopic visualization and hemostasis was obtained with a bipolar.    The entire incision was profusely irrigated.    The fascial layer was reapproximated with the use of a 0 Vicryl suture.  Subcutaneous tissue layer was reapproximated using 2-0 Vicryl suture.  3-0 monocryl was placed in subcuticular fashion, then running nylon on the skin.   Patient was then rotated back to the preoperative bed awakened from anesthesia and taken to recovery all counts are correct in this case.    Lisa [name]

## 2022-01-31 NOTE — TOC Progression Note (Signed)
Transition of Care [facility]) - Progression Note    Patient Details  Name: Nicol Herbig MRN: [id number] Date of Birth: December 30, 1970  Transition of [facility] CM/SW Contact  Izola Price, RN Phone Number: 01/31/2022, 10:00 AM  Clinical Narrative: 7/22: Harriett Rush consult acknowledged for PT/OT/SLP/DME/Medication assistance. Patient has Medicare A/B insurance. POD#1 OR for findings of compressive seroma/hematoma per op progress notes. Previous surgery on 01/19/22. Dx of cauda equina compression with leg weakness and urinary issues. TOC to follow. Simmie Davies RN CM          Expected Discharge Plan and Services                                                 Social Determinants of Health (SDOH) Interventions    Readmission Risk Interventions     No data to display

## 2022-02-01 MED ORDER — CELECOXIB 200 MG PO CAPS
200.0000 mg | ORAL_CAPSULE | Freq: Two times a day (BID) | ORAL | Status: DC
  Administered 2022-02-01 – 2022-02-04 (×7): 200 mg via ORAL
  Filled 2022-02-01 (×7): qty 1

## 2022-02-01 NOTE — Plan of Care (Signed)

## 2022-02-01 NOTE — Progress Notes (Signed)
    Attending Progress Note  History: Lisa [name] is here for cauda equina compression.  She underwent her index surgery on January 19, 2022.  POD2: She is having some pain down the back of her right leg with ambulation, but was able to walk 150' with PT yesterday.    POD1: Doing much better.  Pain improved  Physical Exam: Vitals:   02/01/22 0334 02/01/22 0739  BP: (!) 107/48 (!) 100/57  Pulse: 87 63  Resp: 16 17  Temp: 97.9 F (36.6 C) 98.3 F (36.8 C)  SpO2: 100% 100%    AA Ox3 CNI  Strength:5/5 throughout BLE with exception of R IP and DF, 4/5 Sensation is diminished but much improved She has some limitation in exam due to guarding  Data:  Recent Labs  Lab 01/30/22 1554 01/31/22 1020  NA 135  --   K 3.8  --   CL 106  --   CO2 23  --   BUN 14  --   CREATININE 0.67 0.59  GLUCOSE 148*  --   CALCIUM 8.5*  --    No results for input(s): "AST", "ALT", "ALKPHOS" in the last 168 hours.  Invalid input(s): "TBILI"   Recent Labs  Lab 01/30/22 1554 01/31/22 1020  WBC 16.8* 18.7*  HGB 12.3 12.1  HCT 37.1 35.5*  PLT 291 228   Recent Labs  Lab 01/30/22 1554  APTT 20*  INR 1.0         Other tests/results: n/a  Assessment/Plan:  Lisa [name] is improved after exploration and evacuation of epidural collection.  - mobilize  - pain control - start celebrex today - DVT prophylaxis start this AM - PTOT today   Meade Maw MD, [facility] Department of Neurosurgery

## 2022-02-01 NOTE — Progress Notes (Signed)
Physical Therapy Treatment Patient Details Name: Lisa [name] MRN: [id number] DOB: April 06, 1971 Today's Date: 02/01/2022   History of Present Illness Pt is a 51 year old female admitted with worsening symptoms of right-sided lumbar radiculopathy with some increased effort to urinate. Repeat imaging at the findings above which is concerning for cauda equina compression. Pt is s/pL4-5 and L5-S1 decompression on Monday, July 10. Pt is now s/p exploration and evauation of seroma and hematoma 01/30/22. PMH significant for asthma, PNA, htn, HA's, anxiety, bipolar disorder, schizophrenia, anemia, blood dyscrasia.    PT Comments    Potassium is elevated at 5.3 this am.  Pt in room wanting to walk.  She is able to walk 200' on unit with RW and min guard.  She declined stair training and requested to try tomorrow.  Remained EOB and given ice pack per her request for pain.  No LOB or reports of chest pain/inc HR with mobility.   Recommendations for follow up therapy are one component of a multi-disciplinary discharge planning process, led by the attending physician.  Recommendations may be updated based on patient status, additional functional criteria and insurance authorization.  Follow Up Recommendations  Home health PT     Assistance Recommended at Discharge Intermittent Supervision/Assistance  Patient can return home with the following A little help with walking and/or transfers;A little help with bathing/dressing/bathroom;Assistance with cooking/housework;Assist for transportation;Help with stairs or ramp for entrance   Equipment Recommendations  None recommended by PT    Recommendations for Other Services       Precautions / Restrictions Precautions Precautions: Back;Fall Precaution Comments: No brace needed. Restrictions Weight Bearing Restrictions: No     Mobility  Bed Mobility Overal bed mobility: Modified Independent Bed Mobility: Supine to Sit   Sidelying to sit:  HOB elevated, Modified independent (Device/Increase time)       General bed mobility comments: very slow and effortful/painful but no assist besides rails and HOB elevated    Transfers Overall transfer level: Needs assistance Equipment used: Rolling walker (2 wheels) Transfers: Sit to/from Stand Sit to Stand: Supervision, From elevated surface                Ambulation/Gait Ambulation/Gait assistance: Supervision, Min guard Gait Distance (Feet): 200 Feet Assistive device: Rolling walker (2 wheels) Gait Pattern/deviations: Step-through pattern, Decreased stride length Gait velocity: slow     General Gait Details: slow but steady gait with occasional self initiated rest breaks   Stairs         General stair comments: requests to do them tomorrow.   Wheelchair Mobility    Modified Rankin (Stroke Patients Only)       Balance Overall balance assessment: Needs assistance Sitting-balance support: No upper extremity supported, Feet supported Sitting balance-Leahy Scale: Good     Standing balance support: Bilateral upper extremity supported, Reliant on assistive device for balance Standing balance-Leahy Scale: Fair Standing balance comment: steady ambulating with RW                            Cognition Arousal/Alertness: Awake/alert Behavior During Therapy: WFL for tasks assessed/performed Overall Cognitive Status: Within Functional Limits for tasks assessed                                          Exercises      General Comments  Pertinent Vitals/Pain Pain Assessment Pain Assessment: Faces Faces Pain Scale: Hurts even more Pain Location: back to right glute, states it does feel increased today. Pain Descriptors / Indicators: Grimacing, Guarding, Shooting Pain Intervention(s): Limited activity within patient's tolerance, Monitored during session, Repositioned, Ice applied    Home Living                           Prior Function            PT Goals (current goals can now be found in the care plan section) Progress towards PT goals: Progressing toward goals    Frequency    BID      PT Plan      Co-evaluation              AM-PAC PT "6 Clicks" Mobility   Outcome Measure  Help needed turning from your back to your side while in a flat bed without using bedrails?: A Little Help needed moving from lying on your back to sitting on the side of a flat bed without using bedrails?: A Little Help needed moving to and from a bed to a chair (including a wheelchair)?: A Little Help needed standing up from a chair using your arms (e.g., wheelchair or bedside chair)?: A Little Help needed to walk in hospital room?: A Little Help needed climbing 3-5 steps with a railing? : A Lot 6 Click Score: 17    End of Session Equipment Utilized During Treatment: Gait belt Activity Tolerance: Patient limited by lethargy;Patient limited by pain (patient limited by pain, lightheadedness, and drowsiness) Patient left: in bed;with bed alarm set;with call bell/phone within reach;with SCD's reapplied Nurse Communication: Mobility status PT Visit Diagnosis: Other abnormalities of gait and mobility (R26.89);Muscle weakness (generalized) (M62.81);Pain Pain - Right/Left: Right Pain - part of body: Leg     Time: 6546-5035 PT Time Calculation (min) (ACUTE ONLY): 15 min  Charges:  $Gait Training: 8-22 mins                   Chesley Noon, PTA 02/01/22, 11:04 AM

## 2022-02-02 ENCOUNTER — Encounter: Payer: Self-pay | Admitting: Neurosurgery

## 2022-02-02 NOTE — Progress Notes (Signed)
Patient BP 84/43, VS re-checked thrice. NP K. Foust notified.

## 2022-02-02 NOTE — Plan of Care (Signed)
  Problem: Health Behavior/Discharge Planning: Goal: Ability to manage health-related needs will improve Outcome: Progressing   Problem: Clinical Measurements: Goal: Will remain free from infection Outcome: Progressing Goal: Cardiovascular complication will be avoided Outcome: Progressing   Problem: Activity: Goal: Risk for activity intolerance will decrease Outcome: Progressing   Problem: Nutrition: Goal: Adequate nutrition will be maintained Outcome: Progressing   Problem: Elimination: Goal: Will not experience complications related to urinary retention Outcome: Progressing

## 2022-02-02 NOTE — TOC Progression Note (Signed)
Transition of Care [facility]) - Progression Note    Patient Details  Name: Lisa [name] MRN: [id number] Date of Birth: Nov 30, 1970  Transition of Care [facility]) CM/SW [place], RN Phone Number: 02/02/2022, 8:40 AM  Clinical Narrative:     Patient has a 3 in 1 and a Rolling walker from previous admission and is open with [facility] for [facility] services       Expected Discharge Plan and Services                                                 Social Determinants of Health (SDOH) Interventions    Readmission Risk Interventions     No data to display

## 2022-02-02 NOTE — Progress Notes (Signed)
Dr. Izora Ribas notified on patient latest blood pressure.

## 2022-02-02 NOTE — Discharge Summary (Signed)
Physician Discharge Summary  Patient ID: Lisa [name] MRN: [id number] DOB/AGE: 49/28/1972 49 y.o.  Admit date: 01/30/2022 Discharge date: 02/02/2022  Admission Diagnoses:  Discharge Diagnoses:  Principal Problem:   Cauda equina compression [facility])   Discharged Condition: {condition:18240}  Hospital Course: ***  Consults: {consultation:18241}  Significant Diagnostic Studies: {diagnostics:18242}  Treatments: {Tx:18249}  Discharge Exam: Blood pressure (!) 104/54, pulse 62, temperature 98.2 F (36.8 C), resp. rate 17, last menstrual period 07/17/2019, SpO2 100 %. {physical TDVV:6160737}  Disposition:  There are no questions and answers to display.        Discharge Instructions     Incentive spirometry RT   Complete by: As directed       Allergies as of 02/02/2022       Reactions   Lisinopril Anaphylaxis   Facial swelling, difficulty breathing   Morphine And Related Other (See Comments)   migraine     Med Rec must be completed prior to using this [facility]***       Follow-up Information     Loleta Dicker, PA Follow up in 2 week(s).   Specialty: Neurosurgery Contact information: [address] [place] [address] [phone]                 Signed: Loleta Dicker 02/02/2022, 1:43 PM

## 2022-02-02 NOTE — Anesthesia Postprocedure Evaluation (Signed)
Anesthesia Post Note  Patient: Lisa [name]  Procedure(s) Performed: LUMBAR LAMINECTOMY FOR EPIDURAL ABSCESS  Patient location during evaluation: PACU Anesthesia Type: General Level of consciousness: awake and alert Pain management: pain level controlled Vital Signs Assessment: post-procedure vital signs reviewed and stable Respiratory status: spontaneous breathing, nonlabored ventilation, respiratory function stable and patient connected to nasal cannula oxygen Cardiovascular status: blood pressure returned to baseline and stable Postop Assessment: no apparent nausea or vomiting Anesthetic complications: no   No notable events documented.   Last Vitals:  Vitals:   02/02/22 1713 02/02/22 1720  BP: (!) 94/46 (!) 95/54  Pulse: 61 62  Resp:    Temp: 36.7 C 37 C  SpO2: 100% 100%    Last Pain:  Vitals:   02/02/22 1419  TempSrc:   PainSc: 4                  Martha Clan

## 2022-02-02 NOTE — Progress Notes (Signed)
    Attending Progress Note  History: Brenisha Tsui is here for cauda equina compression.  She underwent her index surgery on January 19, 2022.  POD3: continued right leg pain this morning. BP has been soft but patient denies any lightheadedness or dizziness.   POD2: She is having some pain down the back of her right leg with ambulation, but was able to walk 150' with PT yesterday.    POD1: Doing much better.  Pain improved  Physical Exam: Vitals:   02/02/22 0526 02/02/22 0540  BP: (!) 84/48 (!) 84/43  Pulse: 63 63  Resp: 16 17  Temp: 97.7 F (36.5 C)   SpO2: 99% 99%    AA Ox3 CNI  Strength:5/5 throughout BLE with exception of R IP and DF, 4-/5 Sensation is diminished. Exam limited due to pain.   Data:  Recent Labs  Lab 01/30/22 1554 01/31/22 1020  NA 135  --   K 3.8  --   CL 106  --   CO2 23  --   BUN 14  --   CREATININE 0.67 0.59  GLUCOSE 148*  --   CALCIUM 8.5*  --     No results for input(s): "AST", "ALT", "ALKPHOS" in the last 168 hours.  Invalid input(s): "TBILI"   Recent Labs  Lab 01/30/22 1554 01/31/22 1020  WBC 16.8* 18.7*  HGB 12.3 12.1  HCT 37.1 35.5*  PLT 291 228    Recent Labs  Lab 01/30/22 1554  APTT 20*  INR 1.0          Other tests/results: n/a  Assessment/Plan:  Shyvonne Chastang is improved after exploration and evacuation of epidural collection.  - mobilize  - pain control  - pt having asymptomatic hypotension which is largely stable since admission. Will continue to montior - DVT prophylaxis - PTOT today - dispo planning underway  Cooper Render PA-C Department of Neurosurgery

## 2022-02-02 NOTE — Plan of Care (Signed)
  Problem: Clinical Measurements: Goal: Will remain free from infection Outcome: Progressing   Problem: Clinical Measurements: Goal: Diagnostic test results will improve Outcome: Progressing   Problem: Clinical Measurements: Goal: Respiratory complications will improve Outcome: Progressing   Problem: Clinical Measurements: Goal: Cardiovascular complication will be avoided Outcome: Progressing

## 2022-02-02 NOTE — Discharge Instructions (Signed)
Your surgeon has performed an operation on your lumbar spine (low back) to relieve pressure on one or more nerves. Many times, patients feel better immediately after surgery and can "overdo it." Even if you feel well, it is important that you follow these activity guidelines. If you do not let your back heal properly from the surgery, you can increase the chance of a disc herniation and/or return of your symptoms. The following are instructions to help in your recovery once you have been discharged from the hospital.   Activity    No bending, lifting, or twisting ("BLT"). Avoid lifting objects heavier than 10 pounds (gallon milk jug).  Where possible, avoid household activities that involve lifting, bending, pushing, or pulling such as laundry, vacuuming, grocery shopping, and childcare. Try to arrange for help from friends and family for these activities while your back heals.  Increase physical activity slowly as tolerated.  Taking short walks is encouraged, but avoid strenuous exercise. Do not jog, run, bicycle, lift weights, or participate in any other exercises unless specifically allowed by your doctor. Avoid prolonged sitting, including car rides.  Talk to your doctor before resuming sexual activity.  You should not drive until cleared by your doctor.  Until released by your doctor, you should not return to work or school.  You should rest at home and let your body heal.   You may shower two days after your surgery.  After showering, lightly dab your incision dry. Do not take a tub bath or go swimming for 3 weeks, or until approved by your doctor at your follow-up appointment.  If you smoke, we strongly recommend that you quit.  Smoking has been proven to interfere with normal healing in your back and will dramatically reduce the success rate of your surgery. Please contact [facility] (800-QUIT-NOW) and use the resources at www.[facility].com for assistance in stopping smoking.  Surgical  Incision   If you have a dressing on your incision, you may remove it three days after your surgery. Keep your incision area clean and dry.  If you have staples or stitches on your incision, you should have a follow up scheduled for removal. If you do not have staples or stitches, you will have steri-strips (small pieces of surgical tape) or Dermabond glue. The steri-strips/glue should begin to peel away within about a week (it is fine if the steri-strips fall off before then). If the strips are still in place one week after your surgery, you may gently remove them.  Diet            You may return to your usual diet. Be sure to stay hydrated.  When to Contact us  Although your surgery and recovery will likely be uneventful, you may have some residual numbness, aches, and pains in your back and/or legs. This is normal and should improve in the next few weeks.  However, should you experience any of the following, contact us immediately: New numbness or weakness Pain that is progressively getting worse, and is not relieved by your pain medications or rest Bleeding, redness, swelling, pain, or drainage from surgical incision Chills or flu-like symptoms Fever greater than 101.0 F (38.3 C) Problems with bowel or bladder functions Difficulty breathing or shortness of breath Warmth, tenderness, or swelling in your calf  Contact Information During office hours (Monday-Friday 9 am to 5 pm), please call your physician at [phone] After hours and weekends, please call [phone] and speak with the answering service, who will contact the  doctor on call.  If that fails, call the Micro Operator at [phone] and ask for the Neurosurgery Resident On Call  For a life-threatening emergency, call 911

## 2022-02-02 NOTE — Progress Notes (Signed)
Physical Therapy Treatment Patient Details Name: Lisa [name] MRN: [id number] DOB: 1971-06-17 Today's Date: 02/02/2022   History of Present Illness Pt is a 51 year old female admitted with worsening symptoms of right-sided lumbar radiculopathy with some increased effort to urinate. Repeat imaging at the findings above which is concerning for cauda equina compression. Pt is s/pL4-5 and L5-S1 decompression on Monday, July 10. Pt is now s/p exploration and evauation of seroma and hematoma 01/30/22. PMH significant for asthma, PNA, htn, HA's, anxiety, bipolar disorder, schizophrenia, anemia, blood dyscrasia.    PT Comments    Pt ready for session.  OOB slow with rails but no assist.  She is able to stand and walk on unit and complete stair training.  Increased pain today 9/10 and pt self selects to turn back and not complete full lap today.  Light assist to get LE's back into bed.  BP orthostatics checked and remain stable.  See flow sheets if needed.  She does c/o lightheadedness 5/10 throughout session but it does not limit mobility and does not seem to be BP related.   Recommendations for follow up therapy are one component of a multi-disciplinary discharge planning process, led by the attending physician.  Recommendations may be updated based on patient status, additional functional criteria and insurance authorization.  Follow Up Recommendations  Home health PT     Assistance Recommended at Discharge Intermittent Supervision/Assistance  Patient can return home with the following A little help with walking and/or transfers;A little help with bathing/dressing/bathroom;Assistance with cooking/housework;Assist for transportation;Help with stairs or ramp for entrance   Equipment Recommendations  None recommended by PT    Recommendations for Other Services       Precautions / Restrictions Precautions Precautions: Back;Fall Precaution Comments: No brace  needed. Restrictions Weight Bearing Restrictions: No     Mobility  Bed Mobility Overal bed mobility: Needs Assistance Bed Mobility: Supine to Sit, Sit to Supine   Sidelying to sit: HOB elevated, Modified independent (Device/Increase time)   Sit to supine: Min assist   General bed mobility comments: light assist to get LE's back onto bed    Transfers Overall transfer level: Needs assistance Equipment used: Rolling walker (2 wheels) Transfers: Sit to/from Stand Sit to Stand: Supervision, From elevated surface                Ambulation/Gait Ambulation/Gait assistance: Supervision, Min guard Gait Distance (Feet): 150 Feet Assistive device: Rolling walker (2 wheels) Gait Pattern/deviations: Step-through pattern, Decreased stride length Gait velocity: slow     General Gait Details: slow but steady gait with occasional self initiated rest breaks   Stairs Stairs: Yes Stairs assistance: Min guard, Supervision Stair Management: One rail Right, Forwards, Backwards, With walker Number of Stairs: 8     Wheelchair Mobility    Modified Rankin (Stroke Patients Only)       Balance Overall balance assessment: Needs assistance Sitting-balance support: No upper extremity supported, Feet supported Sitting balance-Leahy Scale: Good     Standing balance support: Bilateral upper extremity supported, Reliant on assistive device for balance Standing balance-Leahy Scale: Fair Standing balance comment: steady ambulating with RW                            Cognition Arousal/Alertness: Awake/alert Behavior During Therapy: WFL for tasks assessed/performed Overall Cognitive Status: Within Functional Limits for tasks assessed  Exercises      General Comments        Pertinent Vitals/Pain Pain Assessment Pain Assessment: 0-10 Pain Score: 9  Pain Descriptors / Indicators: Grimacing, Guarding,  Shooting Pain Intervention(s): Patient requesting pain meds-RN notified, Limited activity within patient's tolerance, Monitored during session, Repositioned    Home Living                          Prior Function            PT Goals (current goals can now be found in the care plan section) Progress towards PT goals: Progressing toward goals    Frequency    BID      PT Plan      Co-evaluation              AM-PAC PT "6 Clicks" Mobility   Outcome Measure  Help needed turning from your back to your side while in a flat bed without using bedrails?: None Help needed moving from lying on your back to sitting on the side of a flat bed without using bedrails?: A Little Help needed moving to and from a bed to a chair (including a wheelchair)?: A Little Help needed standing up from a chair using your arms (e.g., wheelchair or bedside chair)?: None Help needed to walk in hospital room?: A Little Help needed climbing 3-5 steps with a railing? : A Little 6 Click Score: 20    End of Session Equipment Utilized During Treatment: Gait belt Activity Tolerance: Patient limited by lethargy;Patient limited by pain (patient limited by pain, lightheadedness, and drowsiness) Patient left: in bed;with bed alarm set;with call bell/phone within reach;with SCD's reapplied Nurse Communication: Mobility status PT Visit Diagnosis: Other abnormalities of gait and mobility (R26.89);Muscle weakness (generalized) (M62.81);Pain Pain - Right/Left: Right Pain - part of body: Leg     Time: 3704-8889 PT Time Calculation (min) (ACUTE ONLY): 24 min  Charges:  $Gait Training: 23-37 mins                   Chesley Noon, PTA 02/02/22, 12:38 PM

## 2022-02-02 NOTE — Care Management Important Message (Signed)
Important Message  Patient Details  Name: Lisa [name] MRN: [id number] Date of Birth: Apr 14, 1971   Medicare Important Message Given:  N/A - LOS <3 / Initial given by admissions     Juliann Pulse A Beckie Viscardi 02/02/2022, 10:14 AM

## 2022-02-02 NOTE — Progress Notes (Signed)
Physical Therapy Treatment Patient Details Name: Lisa [name] MRN: [id number] DOB: 05-Jun-1971 Today's Date: 02/02/2022   History of Present Illness Pt is a 51 year old female admitted with worsening symptoms of right-sided lumbar radiculopathy with some increased effort to urinate. Repeat imaging at the findings above which is concerning for cauda equina compression. Pt is s/pL4-5 and L5-S1 decompression on Monday, July 10. Pt is now s/p exploration and evauation of seroma and hematoma 01/30/22. PMH significant for asthma, PNA, htn, HA's, anxiety, bipolar disorder, schizophrenia, anemia, blood dyscrasia.    PT Comments    OOB and completed 2 laps on unit with RW - slow but steady gait.  In and out of bed with increased time and rails but no assist.  Recommendations for follow up therapy are one component of a multi-disciplinary discharge planning process, led by the attending physician.  Recommendations may be updated based on patient status, additional functional criteria and insurance authorization.  Follow Up Recommendations  Home health PT     Assistance Recommended at Discharge Intermittent Supervision/Assistance  Patient can return home with the following A little help with walking and/or transfers;A little help with bathing/dressing/bathroom;Assistance with cooking/housework;Assist for transportation;Help with stairs or ramp for entrance   Equipment Recommendations  None recommended by PT    Recommendations for Other Services       Precautions / Restrictions Precautions Precautions: Back;Fall Precaution Comments: No brace needed. Restrictions Weight Bearing Restrictions: No     Mobility  Bed Mobility Overal bed mobility: Modified Independent Bed Mobility: Supine to Sit, Sit to Supine   Sidelying to sit: HOB elevated, Modified independent (Device/Increase time)   Sit to supine: Min assist   General bed mobility comments: no assist this pm.  slow but on her  own with rails    Transfers Overall transfer level: Needs assistance Equipment used: Rolling walker (2 wheels) Transfers: Sit to/from Stand Sit to Stand: Supervision, From elevated surface                Ambulation/Gait Ambulation/Gait assistance: Supervision, Min guard Gait Distance (Feet): 300 Feet Assistive device: Rolling walker (2 wheels) Gait Pattern/deviations: Step-through pattern, Decreased stride length Gait velocity: slow     General Gait Details: slow but steady gait with occasional self initiated rest breaks   Stairs Stairs: Yes Stairs assistance: Min guard, Supervision Stair Management: One rail Right, Forwards, Backwards, With walker Number of Stairs: 8     Wheelchair Mobility    Modified Rankin (Stroke Patients Only)       Balance Overall balance assessment: Needs assistance Sitting-balance support: No upper extremity supported, Feet supported Sitting balance-Leahy Scale: Good     Standing balance support: Bilateral upper extremity supported, Reliant on assistive device for balance Standing balance-Leahy Scale: Fair Standing balance comment: steady ambulating with RW                            Cognition Arousal/Alertness: Awake/alert Behavior During Therapy: WFL for tasks assessed/performed Overall Cognitive Status: Within Functional Limits for tasks assessed                                          Exercises      General Comments        Pertinent Vitals/Pain Pain Assessment Pain Assessment: Faces Pain Score: 9  Faces Pain Scale: Hurts even more Pain  Location: back to right glute, states it does feel increased today. Pain Descriptors / Indicators: Grimacing, Guarding, Shooting Pain Intervention(s): Repositioned, Monitored during session, Limited activity within patient's tolerance    Home Living                          Prior Function            PT Goals (current goals can now be  found in the care plan section) Progress towards PT goals: Progressing toward goals    Frequency    BID      PT Plan      Co-evaluation              AM-PAC PT "6 Clicks" Mobility   Outcome Measure  Help needed turning from your back to your side while in a flat bed without using bedrails?: None Help needed moving from lying on your back to sitting on the side of a flat bed without using bedrails?: None Help needed moving to and from a bed to a chair (including a wheelchair)?: A Little Help needed standing up from a chair using your arms (e.g., wheelchair or bedside chair)?: None Help needed to walk in hospital room?: A Little Help needed climbing 3-5 steps with a railing? : A Little 6 Click Score: 21    End of Session Equipment Utilized During Treatment: Gait belt Activity Tolerance: Patient limited by lethargy;Patient limited by pain (patient limited by pain, lightheadedness, and drowsiness) Patient left: in bed;with bed alarm set;with call bell/phone within reach;with SCD's reapplied Nurse Communication: Mobility status PT Visit Diagnosis: Other abnormalities of gait and mobility (R26.89);Muscle weakness (generalized) (M62.81);Pain Pain - Right/Left: Right Pain - part of body: Leg     Time: 1350-1404 PT Time Calculation (min) (ACUTE ONLY): 14 min  Charges:  $Gait Training: 8-22 mins                   Chesley Noon, PTA 02/02/22, 2:34 PM

## 2022-02-03 ENCOUNTER — Encounter: Payer: Medicare Other | Admitting: Neurosurgery

## 2022-02-03 LAB — URINALYSIS, ROUTINE W REFLEX MICROSCOPIC
Bilirubin Urine: NEGATIVE
Glucose, UA: 150 mg/dL — AB
Hgb urine dipstick: NEGATIVE
Ketones, ur: NEGATIVE mg/dL
Leukocytes,Ua: NEGATIVE
Nitrite: NEGATIVE
Protein, ur: NEGATIVE mg/dL
Specific Gravity, Urine: 1.014 (ref 1.005–1.030)
pH: 6 (ref 5.0–8.0)

## 2022-02-03 NOTE — Progress Notes (Signed)
Physical Therapy Treatment Patient Details Name: Lisa [name] MRN: [id number] DOB: June 03, 1971 Today's Date: 02/03/2022   History of Present Illness Pt is a 51 year old female admitted with worsening symptoms of right-sided lumbar radiculopathy with some increased effort to urinate. Repeat imaging at the findings above which is concerning for cauda equina compression. Pt is s/pL4-5 and L5-S1 decompression on Monday, July 10. Pt is now s/p exploration and evauation of seroma and hematoma 01/30/22. PMH significant for asthma, PNA, htn, HA's, anxiety, bipolar disorder, schizophrenia, anemia, blood dyscrasia.   PT Comments    Patient is agreeable to PT and had pain medication recently. She was able to stand and ambulate over 2 laps in the hallway with rolling walker with only supervision required. Occasional standing rest breaks required due to intermittent increased right leg pain during walking. The patient is mobilizing well enough for discharge home with intermittent assistance from family. She is hesitant about discharge home today as her family is not available to help until tomorrow evening. She also has concerns about her ongoing right leg pain. Recommend to continue PT to maximize independence in preparation for discharge home with HHPT follow up.    Recommendations for follow up therapy are one component of a multi-disciplinary discharge planning process, led by the attending physician.  Recommendations may be updated based on patient status, additional functional criteria and insurance authorization.  Follow Up Recommendations  Home health PT Can patient physically be transported by private vehicle: Yes   Assistance Recommended at Discharge Intermittent Supervision/Assistance  Patient can return home with the following A little help with walking and/or transfers;A little help with bathing/dressing/bathroom;Assistance with cooking/housework;Assist for transportation;Help with  stairs or ramp for entrance   Equipment Recommendations  None recommended by PT    Recommendations for Other Services       Precautions / Restrictions Precautions Precautions: Back;Fall Restrictions Weight Bearing Restrictions: Yes     Mobility  Bed Mobility               General bed mobility comments: not observed    Transfers Overall transfer level: Needs assistance Equipment used: Rolling walker (2 wheels) Transfers: Sit to/from Stand Sit to Stand: Supervision           General transfer comment: verbal cues for technique, no physical assistance required    Ambulation/Gait Ambulation/Gait assistance: Supervision Gait Distance (Feet): 380 Feet Assistive device: Rolling walker (2 wheels) Gait Pattern/deviations: Step-through pattern, Narrow base of support       General Gait Details: very slow cadence. intermittent rest breaks required with intermittent bouts of shooting pain in right leg. no loss of balance noted. encouraged patient to continue using rolling walker for safety with ambulation   Stairs         General stair comments:  (stair training completed yesterdy and patient reports feeling good about stair negotiation)   Wheelchair Mobility    Modified Rankin (Stroke Patients Only)       Balance                                            Cognition Arousal/Alertness: Awake/alert Behavior During Therapy: WFL for tasks assessed/performed Overall Cognitive Status: Within Functional Limits for tasks assessed  Exercises      General Comments        Pertinent Vitals/Pain Pain Assessment Pain Assessment: 0-10 Pain Score: 9  Pain Location: right leg Pain Descriptors / Indicators: Grimacing, Guarding, Shooting Pain Intervention(s): Limited activity within patient's tolerance, Premedicated before session, Repositioned    Home Living                           Prior Function            PT Goals (current goals can now be found in the care plan section) Acute Rehab PT Goals Patient Stated Goal: to return home when she feels ready PT Goal Formulation: With patient Time For Goal Achievement: 02/14/22 Potential to Achieve Goals: Good Progress towards PT goals: Progressing toward goals    Frequency    BID      PT Plan Current plan remains appropriate    Co-evaluation              AM-PAC PT "6 Clicks" Mobility   Outcome Measure  Help needed turning from your back to your side while in a flat bed without using bedrails?: None Help needed moving from lying on your back to sitting on the side of a flat bed without using bedrails?: None Help needed moving to and from a bed to a chair (including a wheelchair)?: A Little Help needed standing up from a chair using your arms (e.g., wheelchair or bedside chair)?: None Help needed to walk in hospital room?: A Little Help needed climbing 3-5 steps with a railing? : A Little 6 Click Score: 21    End of Session Equipment Utilized During Treatment: Gait belt Activity Tolerance: Patient tolerated treatment well Patient left: in chair;with call bell/phone within reach Nurse Communication: Mobility status PT Visit Diagnosis: Other abnormalities of gait and mobility (R26.89);Muscle weakness (generalized) (M62.81);Pain Pain - Right/Left: Right Pain - part of body: Leg     Time: 1059-1130 PT Time Calculation (min) (ACUTE ONLY): 31 min  Charges:  $Gait Training: 23-37 mins                     Minna Merritts, PT, MPT    [name] 02/03/2022, 12:24 PM

## 2022-02-03 NOTE — Progress Notes (Signed)
Physical Therapy Treatment Patient Details Name: Lisa [name] MRN: [id number] DOB: Jun 08, 1971 Today's Date: 02/03/2022   History of Present Illness Pt is a 51 year old female admitted with worsening symptoms of right-sided lumbar radiculopathy with some increased effort to urinate. Repeat imaging at the findings above which is concerning for cauda equina compression. Pt is s/pL4-5 and L5-S1 decompression on Monday, July 10. Pt is now s/p exploration and evauation of seroma and hematoma 01/30/22. PMH significant for asthma, PNA, htn, HA's, anxiety, bipolar disorder, schizophrenia, anemia, blood dyscrasia.    PT Comments    Second PT session completed. Patient ambulated in the room and a lap in the hallway using rolling walker with good safety awareness (this is her 3rd lap of the day). Occasional breaks required due to intermittent increased right leg pain. Patient demonstrated excellent understanding of logroll technique without physical assistance. She reports 7/10 pain with mobility. PT will continue to follow to maximize independence and facilitate readiness for discharge home.    Recommendations for follow up therapy are one component of a multi-disciplinary discharge planning process, led by the attending physician.  Recommendations may be updated based on patient status, additional functional criteria and insurance authorization.  Follow Up Recommendations  Home health PT Can patient physically be transported by private vehicle: Yes   Assistance Recommended at Discharge Intermittent Supervision/Assistance  Patient can return home with the following A little help with walking and/or transfers;A little help with bathing/dressing/bathroom;Assistance with cooking/housework;Assist for transportation;Help with stairs or ramp for entrance   Equipment Recommendations  None recommended by PT    Recommendations for Other Services       Precautions / Restrictions  Precautions Precautions: Back;Fall Precaution Comments: No brace needed. Restrictions Weight Bearing Restrictions: No     Mobility  Bed Mobility Overal bed mobility: Needs Assistance Bed Mobility: Sit to Supine       Sit to supine: Supervision   General bed mobility comments: encouraged patient to continue using logroll technique. with head of bed flat, patient demonstrated excellent understanding.    Transfers Overall transfer level: Needs assistance Equipment used: Rolling walker (2 wheels) Transfers: Sit to/from Stand Sit to Stand: Supervision           General transfer comment: no physical assistance required. good safety awareness with transfers demonstrated. cues for positioning for sitting on the bed    Ambulation/Gait Ambulation/Gait assistance: Supervision Gait Distance (Feet): 175 Feet Assistive device: Rolling walker (2 wheels) Gait Pattern/deviations: Step-through pattern, Decreased stride length Gait velocity: decreased     General Gait Details: very slow cadence. intermittent rest breaks required with intermittent bouts of shooting pain in right leg. no loss of balance noted. encouraged patient to continue using rolling walker for safety with ambulation. gait distance limited by pain   Stairs         General stair comments:  (stair training completed yesterdy and patient reports feeling good about stair negotiation)   Wheelchair Mobility    Modified Rankin (Stroke Patients Only)       Balance Overall balance assessment: Needs assistance Sitting-balance support: No upper extremity supported Sitting balance-Leahy Scale: Good     Standing balance support: Bilateral upper extremity supported, Reliant on assistive device for balance Standing balance-Leahy Scale: Fair                              Cognition Arousal/Alertness: Awake/alert Behavior During Therapy: WFL for tasks assessed/performed Overall Cognitive Status:  Within  Functional Limits for tasks assessed                                          Exercises      General Comments        Pertinent Vitals/Pain Pain Assessment Pain Assessment: 0-10 Pain Score: 7  Pain Location: right leg Pain Descriptors / Indicators: Grimacing, Guarding, Shooting Pain Intervention(s): Limited activity within patient's tolerance    Home Living                          Prior Function            PT Goals (current goals can now be found in the care plan section) Acute Rehab PT Goals Patient Stated Goal: to return home when she feels ready PT Goal Formulation: With patient Time For Goal Achievement: 02/14/22 Potential to Achieve Goals: Good Progress towards PT goals: Progressing toward goals    Frequency    BID      PT Plan Current plan remains appropriate    Co-evaluation              AM-PAC PT "6 Clicks" Mobility   Outcome Measure  Help needed turning from your back to your side while in a flat bed without using bedrails?: None Help needed moving from lying on your back to sitting on the side of a flat bed without using bedrails?: None Help needed moving to and from a bed to a chair (including a wheelchair)?: A Little Help needed standing up from a chair using your arms (e.g., wheelchair or bedside chair)?: None Help needed to walk in hospital room?: A Little Help needed climbing 3-5 steps with a railing? : A Little 6 Click Score: 21    End of Session Equipment Utilized During Treatment: Gait belt Activity Tolerance: Patient tolerated treatment well Patient left: in bed;with call bell/phone within reach;with family/visitor present Nurse Communication: Mobility status PT Visit Diagnosis: Other abnormalities of gait and mobility (R26.89);Muscle weakness (generalized) (M62.81);Pain Pain - Right/Left: Right Pain - part of body: Leg     Time: 4656-8127 PT Time Calculation (min) (ACUTE ONLY): 15 min  Charges:   $Gait Training: 8-22 mins                     Lisa [name], PT, MPT    [name] 02/03/2022, 3:37 PM

## 2022-02-03 NOTE — Plan of Care (Signed)
  Problem: Education: Goal: Knowledge of General Education information will improve Description: Including pain rating scale, medication(s)/side effects and non-pharmacologic comfort measures 02/03/2022 1146 by Bhavik Cabiness Bet, LPN Outcome: Progressing 02/03/2022 1146 by Jonavon Trieu Bet, LPN Outcome: Progressing   Problem: Health Behavior/Discharge Planning: Goal: Ability to manage health-related needs will improve 02/03/2022 1146 by Temara Lanum Bet, LPN Outcome: Progressing 02/03/2022 1146 by Zamiyah Resendes Bet, LPN Outcome: Progressing   Problem: Clinical Measurements: Goal: Ability to maintain clinical measurements within normal limits will improve 02/03/2022 1146 by Keydi Giel Bet, LPN Outcome: Progressing 02/03/2022 1146 by Tyna Huertas Bet, LPN Outcome: Progressing Goal: Will remain free from infection 02/03/2022 1146 by Odette Watanabe Bet, LPN Outcome: Progressing 02/03/2022 1146 by Chief Walkup Bet, LPN Outcome: Progressing Goal: Diagnostic test results will improve 02/03/2022 1146 by Octavia Velador Bet, LPN Outcome: Progressing 02/03/2022 1146 by Anias Bartol Bet, LPN Outcome: Progressing Goal: Respiratory complications will improve 02/03/2022 1146 by Violette Morneault Bet, LPN Outcome: Progressing 02/03/2022 1146 by Stepheni Cameron Bet, LPN Outcome: Progressing Goal: Cardiovascular complication will be avoided 02/03/2022 1146 by Cherisa Brucker Bet, LPN Outcome: Progressing 02/03/2022 1146 by Jyoti Harju Bet, LPN Outcome: Progressing   Problem: Activity: Goal: Risk for activity intolerance will decrease 02/03/2022 1146 by Vada Yellen Bet, LPN Outcome: Progressing 02/03/2022 1146 by Cherl Gorney Bet, LPN Outcome: Progressing   Problem: Nutrition: Goal: Adequate nutrition will be maintained 02/03/2022 1146 by Phoua Hoadley Bet, LPN Outcome: Progressing 02/03/2022 1146 by Lynnette Pote Bet, LPN Outcome: Progressing   Problem: Coping: Goal: Level of anxiety will decrease 02/03/2022 1146 by Caidence Kaseman Bet, LPN Outcome:  Progressing 02/03/2022 1146 by Melat Wrisley Bet, LPN Outcome: Progressing   Problem: Elimination: Goal: Will not experience complications related to bowel motility 02/03/2022 1146 by Ryeleigh Santore Bet, LPN Outcome: Progressing 02/03/2022 1146 by Shon Indelicato Bet, LPN Outcome: Progressing Goal: Will not experience complications related to urinary retention 02/03/2022 1146 by Saree Krogh Bet, LPN Outcome: Progressing 02/03/2022 1146 by Sakia Schrimpf Bet, LPN Outcome: Progressing   Problem: Pain Managment: Goal: General experience of comfort will improve 02/03/2022 1146 by Jissell Trafton Bet, LPN Outcome: Progressing 02/03/2022 1146 by Radwan Cowley Bet, LPN Outcome: Progressing   Problem: Safety: Goal: Ability to remain free from injury will improve 02/03/2022 1146 by Dorella Laster Bet, LPN Outcome: Progressing 02/03/2022 1146 by Collette Pescador Bet, LPN Outcome: Progressing   Problem: Skin Integrity: Goal: Risk for impaired skin integrity will decrease 02/03/2022 1146 by Marlee Armenteros Bet, LPN Outcome: Progressing 02/03/2022 1146 by Junior Huezo Bet, LPN Outcome: Progressing

## 2022-02-03 NOTE — Care Management Important Message (Signed)
Important Message  Patient Details  Name: Lisa [name] MRN: [id number] Date of Birth: Dec 27, 1970   Medicare Important Message Given:  N/A - LOS <3 / Initial given by admissions     Lisa [name] 02/03/2022, 11:49 AM

## 2022-02-03 NOTE — Progress Notes (Addendum)
    Attending Progress Note  History: Lisa [name] is here for cauda equina compression.  She underwent her index surgery on January 19, 2022.  POD4: Continued right leg pain with associated numbness. Largely unchanged from yesterday per patient   POD3: continued right leg pain this morning. BP has been soft but patient denies any lightheadedness or dizziness.   POD2: She is having some pain down the back of her right leg with ambulation, but was able to walk 150' with PT yesterday.    POD1: Doing much better.  Pain improved  Physical Exam: Vitals:   02/03/22 0355 02/03/22 0736  BP: (!) 101/50 (!) 94/53  Pulse: 71 60  Resp: 18 16  Temp: 98.1 F (36.7 C) (!) 97.5 F (36.4 C)  SpO2: 100% 100%    AA Ox3 CNI  Strength:5/5 throughout BLE with exception of R IP and DF, 4-/5 Sensation is diminished. Incision with clean dressing in place.   Data:  Recent Labs  Lab 01/30/22 1554 01/31/22 1020  NA 135  --   K 3.8  --   CL 106  --   CO2 23  --   BUN 14  --   CREATININE 0.67 0.59  GLUCOSE 148*  --   CALCIUM 8.5*  --     No results for input(s): "AST", "ALT", "ALKPHOS" in the last 168 hours.  Invalid input(s): "TBILI"   Recent Labs  Lab 01/30/22 1554 01/31/22 1020  WBC 16.8* 18.7*  HGB 12.3 12.1  HCT 37.1 35.5*  PLT 291 228    Recent Labs  Lab 01/30/22 1554  APTT 20*  INR 1.0          Other tests/results: n/a  Assessment/Plan:  Lisa [name] is improved after exploration and evacuation of epidural collection.  - mobilize  - pain control  - pt having asymptomatic hypotension which is largely stable since admission. Will continue to montior - DVT prophylaxis - PTOT; will re-evaluate dispo plan after therapy today  Lisa Render PA-C Department of Neurosurgery

## 2022-02-03 NOTE — Plan of Care (Signed)

## 2022-02-03 NOTE — Progress Notes (Signed)
Occupational Therapy Treatment Patient Details Name: Lisa [name] MRN: [id number] DOB: [birth year] Today's Date: 02/03/2022   History of present illness Pt is a 51 year old female admitted with worsening symptoms of right-sided lumbar radiculopathy with some increased effort to urinate. Repeat imaging at the findings above which is concerning for cauda equina compression. Pt is s/pL4-5 and L5-S1 decompression on Monday, July 10. Pt is now s/p exploration and evauation of seroma and hematoma 01/30/22. PMH significant for asthma, PNA, htn, HA's, anxiety, bipolar disorder, schizophrenia, anemia, blood dyscrasia.   OT comments  Pt seen for OT tx following PT session. Pt endorsing   9/10 RLE pain despite recent pain medication. RN notified per pt's request. Pt instructed in home/routines modifications for ADL and IADL including AE/DME recommendations to maximize safety and independence while minimizing RLE pain with hip flexion/bending. Pt verbalized understanding, appreciative of instruction/education. Family member in room at end of session. Pt continues to benefit from skilled OT services while hospitalized.    Recommendations for follow up therapy are one component of a multi-disciplinary discharge planning process, led by the attending physician.  Recommendations may be updated based on patient status, additional functional criteria and insurance authorization.    Follow Up Recommendations  No OT follow up    Assistance Recommended at Discharge Intermittent Supervision/Assistance  Patient can return home with the following  A little help with walking and/or transfers;Assistance with cooking/housework;A little help with bathing/dressing/bathroom;Help with stairs or ramp for entrance   Equipment Recommendations  Tub/shower seat;Other (comment) (reacher, suction handheld showerhead holder)    Recommendations for Other Services      Precautions / Restrictions Precautions Precautions:  Back;Fall Precaution Comments: No brace needed. Restrictions Weight Bearing Restrictions: Yes       Mobility Bed Mobility               General bed mobility comments: not observed    Transfers Overall transfer level: Needs assistance Equipment used: Rolling walker (2 wheels) Transfers: Sit to/from Stand Sit to Stand: Supervision                 Balance Overall balance assessment: Needs assistance Sitting-balance support: No upper extremity supported, Feet supported Sitting balance-Leahy Scale: Good     Standing balance support: Bilateral upper extremity supported, Reliant on assistive device for balance Standing balance-Leahy Scale: Fair                             ADL either performed or assessed with clinical judgement   ADL Overall ADL's : Needs assistance/impaired                                       General ADL Comments: Pt requires assist for donning socks, PRN assist for other LB dressing.    Extremity/Trunk Assessment              Vision       Perception     Praxis      Cognition Arousal/Alertness: Awake/alert Behavior During Therapy: WFL for tasks assessed/performed Overall Cognitive Status: Within Functional Limits for tasks assessed                                          Exercises Other Exercises Other  Exercises: pt instructed in home/routines modifications for ADL and IADL including AE/DME recommendations to maximize safety and independence while minimizing RLE pain with hip flexion/bending.    Shoulder Instructions       General Comments      Pertinent Vitals/ Pain       Pain Assessment Pain Assessment: 0-10 Pain Score: 9  Pain Location: right leg Pain Descriptors / Indicators: Grimacing, Guarding, Shooting Pain Intervention(s): Limited activity within patient's tolerance, Monitored during session, Premedicated before session, Repositioned, Patient requesting pain  meds-RN notified  Home Living                                          Prior Functioning/Environment              Frequency  Min 2X/week        Progress Toward Goals  OT Goals(current goals can now be found in the care plan section)  Progress towards OT goals: Progressing toward goals  Acute Rehab OT Goals Patient Stated Goal: go home OT Goal Formulation: With patient Time For Goal Achievement: 02/14/22 Potential to Achieve Goals: Good  Plan Discharge plan remains appropriate;Frequency remains appropriate    Co-evaluation                 AM-PAC OT "6 Clicks" Daily Activity     Outcome Measure   Help from another person eating meals?: None Help from another person taking care of personal grooming?: None Help from another person toileting, which includes using toliet, bedpan, or urinal?: A Little Help from another person bathing (including washing, rinsing, drying)?: A Little Help from another person to put on and taking off regular upper body clothing?: None Help from another person to put on and taking off regular lower body clothing?: A Little 6 Click Score: 21    End of Session    OT Visit Diagnosis: Muscle weakness (generalized) (M62.81)   Activity Tolerance Patient tolerated treatment well   Patient Left in chair;with call bell/phone within reach;with family/visitor present   Nurse Communication Mobility status;Patient requests pain meds        Time: 1139-1150 OT Time Calculation (min): 11 min  Charges: OT General Charges $OT Visit: 1 Visit OT Treatments $Self Care/Home Management : 8-22 mins  Ardeth Perfect., MPH, MS, OTR/L ascom [phone] 02/03/22, 1:14 PM

## 2022-02-04 MED ORDER — CELECOXIB 200 MG PO CAPS: 200.0000 mg | ORAL_CAPSULE | Freq: Two times a day (BID) | ORAL | 0 refills | Status: DC

## 2022-02-04 MED ORDER — ENOXAPARIN SODIUM 40 MG/0.4ML IJ SOSY: 40.0000 mg | PREFILLED_SYRINGE | INTRAMUSCULAR | 0 refills | Status: DC

## 2022-02-04 MED ORDER — OXYCODONE HCL 5 MG PO TABS
5.0000 mg | ORAL_TABLET | ORAL | 0 refills | Status: DC | PRN
Start: 2022-02-04 — End: 2022-02-13

## 2022-02-04 NOTE — Care Management Important Message (Signed)
Important Message  Patient Details  Name: Lisa [name] MRN: [id number] Date of Birth: Mar 30, 1971   Medicare Important Message Given:  Yes     Juliann Pulse A Aundrea Horace 02/04/2022, 10:14 AM

## 2022-02-04 NOTE — Progress Notes (Signed)
PT Cancellation Note  Patient Details Name: Lisa [name] MRN: [id number] DOB: 1971/05/04   Cancelled Treatment:    Reason Eval/Treat Not Completed:  (patient politely declined as she was ambulating laps around hallway last night and anticipated to d/c later today. recommend HHPT follow up once discharged.)  Minna Merritts, PT, MPT  [name] 02/04/2022, 12:15 PM

## 2022-02-04 NOTE — Progress Notes (Signed)
    Attending Progress Note  History: Lisa [name] is here for cauda equina compression.  She underwent her index surgery on January 19, 2022.  POD5: NAEO  POD4: Continued right leg pain with associated numbness. Largely unchanged from yesterday per patient   POD3: continued right leg pain this morning. BP has been soft but patient denies any lightheadedness or dizziness.   POD2: She is having some pain down the back of her right leg with ambulation, but was able to walk 150' with PT yesterday.    POD1: Doing much better.  Pain improved  Physical Exam: Vitals:   02/03/22 2049 02/04/22 0505  BP:  97/62  Pulse:  63  Resp:  20  Temp:  98.3 F (36.8 C)  SpO2: 97% 100%    AA Ox3 CNI  Strength:5/5 throughout BLE with exception of R IP and DF, 4-/5 Sensation is diminished in RLE Incision with clean dressing in place.   Data:  Recent Labs  Lab 01/30/22 1554 01/31/22 1020  NA 135  --   K 3.8  --   CL 106  --   CO2 23  --   BUN 14  --   CREATININE 0.67 0.59  GLUCOSE 148*  --   CALCIUM 8.5*  --     No results for input(s): "AST", "ALT", "ALKPHOS" in the last 168 hours.  Invalid input(s): "TBILI"   Recent Labs  Lab 01/30/22 1554 01/31/22 1020  WBC 16.8* 18.7*  HGB 12.3 12.1  HCT 37.1 35.5*  PLT 291 228    Recent Labs  Lab 01/30/22 1554  APTT 20*  INR 1.0          Other tests/results: n/a  Assessment/Plan:  Lisa [name] is improved after exploration and evacuation of epidural collection.  - mobilize  - pain control  - pt having asymptomatic hypotension which is largely stable since admission. Will continue to montior - DVT prophylaxis - PTOT; dispo planning underway  Cooper Render PA-C Department of Neurosurgery

## 2022-02-04 NOTE — Plan of Care (Signed)
Reviewed discharge Instructions with pt. Pt verbalized understanding.   Problem: Education: Goal: Knowledge of General Education information will improve Description: Including pain rating scale, medication(s)/side effects and non-pharmacologic comfort measures 02/04/2022 1220 by Felma Pfefferle Bet, LPN Outcome: Adequate for Discharge 02/04/2022 1220 by Daven Montz Bet, LPN Outcome: Progressing   Problem: Health Behavior/Discharge Planning: Goal: Ability to manage health-related needs will improve 02/04/2022 1220 by Patrycja Mumpower Bet, LPN Outcome: Adequate for Discharge 02/04/2022 1220 by Layna Roeper Bet, LPN Outcome: Progressing   Problem: Clinical Measurements: Goal: Ability to maintain clinical measurements within normal limits will improve 02/04/2022 1220 by Babygirl Trager Bet, LPN Outcome: Adequate for Discharge 02/04/2022 1220 by Shondell Fabel Bet, LPN Outcome: Progressing Goal: Will remain free from infection 02/04/2022 1220 by Tamzin Bertling Bet, LPN Outcome: Adequate for Discharge 02/04/2022 1220 by Loras Grieshop Bet, LPN Outcome: Progressing Goal: Diagnostic test results will improve 02/04/2022 1220 by Adell Koval Bet, LPN Outcome: Adequate for Discharge 02/04/2022 1220 by Miosha Behe Bet, LPN Outcome: Progressing Goal: Respiratory complications will improve 02/04/2022 1220 by Marlina Cataldi Bet, LPN Outcome: Adequate for Discharge 02/04/2022 1220 by Eran Windish Bet, LPN Outcome: Progressing Goal: Cardiovascular complication will be avoided 02/04/2022 1220 by Sharline Lehane Bet, LPN Outcome: Adequate for Discharge 02/04/2022 1220 by Mylinda Brook Bet, LPN Outcome: Progressing   Problem: Activity: Goal: Risk for activity intolerance will decrease 02/04/2022 1220 by Muneeb Veras Bet, LPN Outcome: Adequate for Discharge 02/04/2022 1220 by Jacia Sickman Bet, LPN Outcome: Progressing   Problem: Nutrition: Goal: Adequate nutrition will be maintained 02/04/2022 1220 by Devyn Sheerin Bet, LPN Outcome: Adequate for  Discharge 02/04/2022 1220 by Demontray Franta Bet, LPN Outcome: Progressing   Problem: Coping: Goal: Level of anxiety will decrease 02/04/2022 1220 by Shawnelle Spoerl Bet, LPN Outcome: Adequate for Discharge 02/04/2022 1220 by Zohan Shiflet Bet, LPN Outcome: Progressing   Problem: Elimination: Goal: Will not experience complications related to bowel motility 02/04/2022 1220 by Kiowa Peifer Bet, LPN Outcome: Adequate for Discharge 02/04/2022 1220 by Townsend Cudworth Bet, LPN Outcome: Progressing Goal: Will not experience complications related to urinary retention 02/04/2022 1220 by Chekesha Behlke Bet, LPN Outcome: Adequate for Discharge 02/04/2022 1220 by Madisen Ludvigsen Bet, LPN Outcome: Progressing   Problem: Pain Managment: Goal: General experience of comfort will improve 02/04/2022 1220 by Naphtali Zywicki Bet, LPN Outcome: Adequate for Discharge 02/04/2022 1220 by Lynel Forester Bet, LPN Outcome: Progressing   Problem: Safety: Goal: Ability to remain free from injury will improve 02/04/2022 1220 by Darrin Koman Bet, LPN Outcome: Adequate for Discharge 02/04/2022 1220 by Jodie Leiner Bet, LPN Outcome: Progressing   Problem: Skin Integrity: Goal: Risk for impaired skin integrity will decrease 02/04/2022 1220 by Miki Blank Bet, LPN Outcome: Adequate for Discharge 02/04/2022 1220 by Jordanna Hendrie Bet, LPN Outcome: Progressing   Problem: Acute Rehab OT Goals (only OT should resolve) Goal: Pt. Will Perform Grooming Outcome: Adequate for Discharge Goal: Pt. Will Perform Toileting-Clothing Manipulation Outcome: Adequate for Discharge Goal: Pt. Will Perform Tub/Shower Transfer Outcome: Adequate for Discharge   Problem: Acute Rehab PT Goals(only PT should resolve) Goal: Pt Will Go Supine/Side To Sit Outcome: Adequate for Discharge Goal: Pt Will Go Sit To Supine/Side Outcome: Adequate for Discharge Goal: Patient Will Transfer Sit To/From Stand Outcome: Adequate for Discharge Goal: Pt Will Ambulate Outcome: Adequate for  Discharge Goal: Pt Will Go Up/Down Stairs Outcome: Adequate for Discharge

## 2022-02-04 NOTE — Plan of Care (Signed)

## 2022-02-05 ENCOUNTER — Telehealth: Payer: Self-pay

## 2022-02-05 DIAGNOSIS — G834 Cauda equina syndrome: Secondary | ICD-10-CM

## 2022-02-05 NOTE — Telephone Encounter (Signed)
Yes to [facility] - she was already set up with them prior to this surgery and this is to resume care. Order placed. Can you fax the order?

## 2022-02-05 NOTE — Telephone Encounter (Signed)
-----   Message from Peggyann Shoals sent at 02/05/2022  1:45 PM EDT ----- Regarding: [facility] orders Contact: [phone]  L4-S1 decompression on 01/19/2022, exploration and evacuation of epidural collection 01/30/22   Raquel Sarna from [facility]  Verbal order for PT  1 x 1 3 x 1 2 x 2  Can Dr.Yarbrough send an order for a tub bench to [facility].

## 2022-02-05 NOTE — Telephone Encounter (Signed)
Raquel Sarna notified of orders.

## 2022-02-06 ENCOUNTER — Other Ambulatory Visit: Payer: Self-pay

## 2022-02-11 ENCOUNTER — Encounter: Payer: Self-pay | Admitting: Internal Medicine

## 2022-02-13 ENCOUNTER — Ambulatory Visit (INDEPENDENT_AMBULATORY_CARE_PROVIDER_SITE_OTHER): Payer: Medicare Other | Admitting: Neurosurgery

## 2022-02-13 ENCOUNTER — Other Ambulatory Visit: Payer: Self-pay | Admitting: Neurosurgery

## 2022-02-13 VITALS — BP 122/78 | Temp 98.7°F | Ht 67.5 in | Wt 204.0 lb

## 2022-02-13 DIAGNOSIS — G834 Cauda equina syndrome: Secondary | ICD-10-CM

## 2022-02-13 DIAGNOSIS — Z9889 Other specified postprocedural states: Secondary | ICD-10-CM

## 2022-02-13 DIAGNOSIS — Z09 Encounter for follow-up examination after completed treatment for conditions other than malignant neoplasm: Secondary | ICD-10-CM

## 2022-02-13 MED ORDER — OXYCODONE HCL 5 MG PO TABS: 5.0000 mg | ORAL_TABLET | Freq: Four times a day (QID) | ORAL | 0 refills | Status: AC | PRN

## 2022-02-13 NOTE — Progress Notes (Signed)
   REFERRING PHYSICIAN:  [facility] [address],  [place] [id number]  DOS:  01/19/22 L4-S1 decompression 01/30/22 exploration and evacuation of epidural fluid collection  HISTORY OF PRESENT ILLNESS: Lisa [name] is status post the above surgical procedures. she is doing pretty well postoperatively.  She states she does continue to have some pain down her right leg but that it has improved significantly since her hospitalization.  She continues to take oxycodone every 6 hours as needed as well as gabapentin and Robaxin.  She denies any incisional concerns or new neurologic symptoms.  PHYSICAL EXAMINATION:  General: Patient is well developed, well nourished, calm, collected, and in no apparent distress.   NEUROLOGICAL:  General: In no acute distress.   Awake, alert, oriented to person, place, and time.  Pupils equal round and reactive to light.  Facial tone is symmetric.  Tongue protrusion is midline.  There is no pronator drift.   Strength:            Side Iliopsoas Quads Hamstring PF DF EHL  R '5 5 5 5 '$ 4+ 5  L '5 5 5 5 5 5   '$ Incision c/d/I and healing well. There is some scabbing but no signs of infection or drainage.   ROS (Neurologic):  Negative except as noted above  IMAGING: No interval imaging to review  ASSESSMENT/PLAN:  Lisa [name] is doing well approximately lumbar decompression and subsequent fluid evacuation.  She states that she is taking more than 300 mg of gabapentin 3 times a day and will need a refill of this medication.  She is not sure of the exact dosage but will let us know and we will send in a refill at that time.  She is also requested a refill of her oxycodone.  We discussed activity escalation and I have advised the patient to lift up to 10 pounds until 6 weeks after surgery, then increase up to 25 pounds until 12 weeks after surgery.  After 12 weeks post-op, the patient advised to increase activity as tolerated. she  will follow up with Dr. Izora Ribas in 4 weeks Advised to contact the office if any questions or concerns arise.  Cooper Render PA-C Department of neurosurgery

## 2022-02-16 ENCOUNTER — Other Ambulatory Visit: Payer: Self-pay | Admitting: Neurosurgery

## 2022-02-16 ENCOUNTER — Telehealth: Payer: Self-pay

## 2022-02-16 MED ORDER — GABAPENTIN 600 MG PO TABS: 600.0000 mg | ORAL_TABLET | Freq: Three times a day (TID) | ORAL | 1 refills | Status: DC

## 2022-02-16 NOTE — Telephone Encounter (Signed)
-----   Message from Peggyann Shoals sent at 02/16/2022 10:38 AM EDT ----- Regarding: postop BM issues L4-S1 decpmpression on 01/19/2022, exploration and evacuation of epidural collection 01/30/22 - ok per Clarisa Kindred from [facility] calling, she is with the patient  and she is complaining of not having a bowel movement since her last surgery. She has taken Murelax and that did nothing. What else can you recommend? Call [facility] at [phone].

## 2022-02-18 ENCOUNTER — Encounter: Payer: Self-pay | Admitting: Psychiatry

## 2022-02-18 ENCOUNTER — Ambulatory Visit (INDEPENDENT_AMBULATORY_CARE_PROVIDER_SITE_OTHER): Payer: Medicare Other | Admitting: Psychiatry

## 2022-02-18 VITALS — BP 115/77 | HR 65 | Temp 98.3°F | Wt 197.4 lb

## 2022-02-18 DIAGNOSIS — F3132 Bipolar disorder, current episode depressed, moderate: Secondary | ICD-10-CM | POA: Diagnosis not present

## 2022-02-18 DIAGNOSIS — F411 Generalized anxiety disorder: Secondary | ICD-10-CM

## 2022-02-18 DIAGNOSIS — F439 Reaction to severe stress, unspecified: Secondary | ICD-10-CM

## 2022-02-18 MED ORDER — LAMOTRIGINE 25 MG PO TABS: 25.0000 mg | ORAL_TABLET | Freq: Every day | ORAL | 0 refills | Status: DC

## 2022-02-18 MED ORDER — HYDROXYZINE HCL 25 MG PO TABS: 25.0000 mg | ORAL_TABLET | Freq: Two times a day (BID) | ORAL | 1 refills | Status: DC | PRN

## 2022-02-18 MED ORDER — LAMOTRIGINE 150 MG PO TABS: 75.0000 mg | ORAL_TABLET | Freq: Two times a day (BID) | ORAL | 0 refills | Status: DC

## 2022-02-18 NOTE — Patient Instructions (Signed)
[email or web address]  [email or web address]   [facility] [place] [phone]  [facility] [phone]   [facility]-  call or text [phone] or visit [place] at [address] [place], [place] [address].    Lamotrigine Tablets What is this medication? LAMOTRIGINE (la MOE Hendricks Limes) prevents and controls seizures in people with epilepsy. It may also be used to treat bipolar disorder. It works by calming overactive nerves in your body. This medicine may be used for other purposes; ask your health care provider or pharmacist if you have questions. COMMON BRAND NAME(S): Lamictal, Subvenite What should I tell my care team before I take this medication? They need to know if you have any of these conditions: Heart disease History of irregular heartbeat Immune system problems Kidney disease Liver disease Low levels of folic acid in the blood Lupus Mental illness Suicidal thoughts, plans, or attempt; a previous suicide attempt by you or a family member An unusual or allergic reaction to lamotrigine or other seizure medications, other medications, foods, dyes, or preservatives Pregnant or trying to get pregnant Breast-feeding How should I use this medication? Take this medication by mouth with a glass of water. Follow the directions on the prescription label. Do not chew these tablets. If this medication upsets your stomach, take it with food or milk. Take your doses at regular intervals. Do not take your medication more often than directed. A special MedGuide will be given to you by the pharmacist with each new prescription and refill. Be sure to read this information carefully each time. Talk to your care team about the use of this medication in children. While this medication may be prescribed for children as young as 2 years for selected conditions, precautions do apply. Overdosage: If you think you have taken too much of this  medicine contact a poison control center or emergency room at once. NOTE: This medicine is only for you. Do not share this medicine with others. What if I miss a dose? If you miss a dose, take it as soon as you can. If it is almost time for your next dose, take only that dose. Do not take double or extra doses. What may interact with this medication? Atazanavir Birth control pills Certain medications for irregular heartbeat Certain medications for seizures like carbamazepine, phenobarbital, phenytoin, primidone, valproic acid Lopinavir Rifampin Ritonavir This list may not describe all possible interactions. Give your health care provider a list of all the medicines, herbs, non-prescription drugs, or dietary supplements you use. Also tell them if you smoke, drink alcohol, or use illegal drugs. Some items may interact with your medicine. What should I watch for while using this medication? Visit your care team for regular checks on your progress. If you take this medication for seizures, wear a Medic Alert bracelet or necklace. Carry an identification card with information about your condition, medications, and care team. It is important to take this medication exactly as directed. When first starting treatment, your dose will need to be adjusted slowly. It may take weeks or months before your dose is stable. You should contact your care team if your seizures get worse or if you have any new types of seizures. Do not stop taking this medication unless instructed by your care team. Stopping your medication suddenly can increase your seizures or their severity. This medication may cause serious skin reactions. They can happen weeks to months after starting the medication.  Contact your care team right away if you notice fevers or flu-like symptoms with a rash. The rash may be red or purple and then turn into blisters or peeling of the skin. Or, you might notice a red rash with swelling of the face, lips  or lymph nodes in your neck or under your arms. You may get drowsy, dizzy, or have blurred vision. Do not drive, use machinery, or do anything that needs mental alertness until you know how this medication affects you. To reduce dizzy or fainting spells, do not sit or stand up quickly, especially if you are an older patient. Alcohol can increase drowsiness and dizziness. Avoid alcoholic drinks. If you are taking this medication for bipolar disorder, it is important to report any changes in your mood to your care team. If your condition gets worse, you get mentally depressed, feel very hyperactive or manic, have difficulty sleeping, or have thoughts of hurting yourself or committing suicide, you need to get help from your care team right away. If you are a caregiver for someone taking this medication for bipolar disorder, you should also report these behavioral changes right away. The use of this medication may increase the chance of suicidal thoughts or actions. Pay special attention to how you are responding while on this medication. Your mouth may get dry. Chewing sugarless gum or sucking hard candy, and drinking plenty of water may help. Contact your care team if the problem does not go away or is severe. Women who become pregnant while using this medication may enroll in the [place] Pregnancy Registry by calling [phone]. This registry collects information about the safety of antiepileptic medication use during pregnancy. This medication may cause a decrease in folic acid. You should make sure that you get enough folic acid while you are taking this medication. Discuss the foods you eat and the vitamins you take with your care team. What side effects may I notice from receiving this medication? Side effects that you should report to your care team as soon as possible: Allergic reactions--skin rash, itching, hives, swelling of the face, lips, tongue, or throat Change in  vision Fever, neck pain or stiffness, sensitivity to light, headache, nausea, vomiting, confusion Heart rhythm changes--fast or irregular heartbeat, dizziness, feeling faint or lightheaded, chest pain, trouble breathing Infection--fever, chills, cough, or sore throat Liver injury--right upper belly pain, loss of appetite, nausea, light-colored stool, dark yellow or brown urine, yellowing skin or eyes, unusual weakness or fatigue Low red blood cell count--unusual weakness or fatigue, dizziness, headache, trouble breathing Rash, fever, and swollen lymph nodes Redness, blistering, peeling or loosening of the skin, including inside the mouth Thoughts of suicide or self-harm, worsening mood, or feelings of depression Unusual bruising or bleeding Side effects that usually do not require medical attention (report to your care team if they continue or are bothersome): Diarrhea Dizziness Drowsiness Headache Nausea Stomach pain Tremors or shaking This list may not describe all possible side effects. Call your doctor for medical advice about side effects. You may report side effects to FDA at 1-800-FDA-1088. Where should I keep my medication? Keep out of the reach of children and pets. Store at [employer] C (77 degrees F). Protect from light. Get rid of any unused medication after the expiration date. To get rid of medications that are no longer needed or have expired: Take the medication to a medication take-back program. Check with your pharmacy or law enforcement to find a location. If you cannot  return the medication, check the label or package insert to see if the medication should be thrown out in the garbage or flushed down the toilet. If you are not sure, ask your care team. If it is safe to put it in the trash, empty the medication out of the container. Mix the medication with cat litter, dirt, coffee grounds, or other unwanted substance. Seal the mixture in a bag or container. Put it in the  trash. NOTE: This sheet is a summary. It may not cover all possible information. If you have questions about this medicine, talk to your doctor, pharmacist, or health care provider.  2023 Elsevier/Gold Standard (2020-10-11 00:00:00) Hydroxyzine Capsules or Tablets What is this medication?   HYDROXYZINE (hye [place] i zeen) treats the symptoms of allergies and allergic reactions. It may also be used to treat anxiety or cause drowsiness before a procedure. It works by blocking histamine, a substance released by the body during an allergic reaction. It belongs to a group of medications called antihistamines. This medicine may be used for other purposes; ask your health care provider or pharmacist if you have questions. COMMON BRAND NAME(S): ANX, Atarax, Rezine, Vistaril What should I tell my care team before I take this medication? They need to know if you have any of these conditions: Glaucoma Heart disease History of irregular heartbeat Kidney disease Liver disease Lung or breathing disease, like asthma Stomach or intestine problems Thyroid disease Trouble passing urine An unusual or allergic reaction to hydroxyzine, cetirizine, other medications, foods, dyes or preservatives Pregnant or trying to get pregnant Breast-feeding How should I use this medication? Take this medication by mouth with a full glass of water. Follow the directions on the prescription label. You may take this medication with food or on an empty stomach. Take your medication at regular intervals. Do not take your medication more often than directed. Talk to your care team regarding the use of this medication in children. Special care may be needed. While this medication may be prescribed for children as young as 58 years of age for selected conditions, precautions do apply. Patients over 21 years old may have a stronger reaction and need a smaller dose. Overdosage: If you think you have taken too much of this medicine  contact a poison control center or emergency room at once. NOTE: This medicine is only for you. Do not share this medicine with others. What if I miss a dose? If you miss a dose, take it as soon as you can. If it is almost time for your next dose, take only that dose. Do not take double or extra doses. What may interact with this medication? Do not take this medication with any of the following: Cisapride Dronedarone Pimozide Thioridazine This medication may also interact with the following: Alcohol Antihistamines for allergy, cough, and cold Atropine Barbiturate medications for sleep or seizures, like phenobarbital Certain antibiotics like erythromycin or clarithromycin Certain medications for anxiety or sleep Certain medications for bladder problems like oxybutynin, tolterodine Certain medications for depression or psychotic disturbances Certain medications for irregular heart beat Certain medications for Parkinson's disease like benztropine, trihexyphenidyl Certain medications for seizures like phenobarbital, primidone Certain medications for stomach problems like dicyclomine, hyoscyamine Certain medications for travel sickness like scopolamine Ipratropium Narcotic medications for pain Other medications that prolong the QT interval (which can cause an abnormal heart rhythm) like dofetilide This list may not describe all possible interactions. Give your health care provider a list of all the medicines, herbs, non-prescription  drugs, or dietary supplements you use. Also tell them if you smoke, drink alcohol, or use illegal drugs. Some items may interact with your medicine. What should I watch for while using this medication? Tell your care team if your symptoms do not improve. You may get drowsy or dizzy. Do not drive, use machinery, or do anything that needs mental alertness until you know how this medication affects you. Do not stand or sit up quickly, especially if you are an older  patient. This reduces the risk of dizzy or fainting spells. Alcohol may interfere with the effect of this medication. Avoid alcoholic drinks. Your mouth may get dry. Chewing sugarless gum or sucking hard candy, and drinking plenty of water may help. Contact your care team if the problem does not go away or is severe. This medication may cause dry eyes and blurred vision. If you wear contact lenses you may feel some discomfort. Lubricating drops may help. See your eye care specialist if the problem does not go away or is severe. If you are receiving skin tests for allergies, tell your care team you are using this medication. What side effects may I notice from receiving this medication? Side effects that you should report to your care team as soon as possible: Allergic reactions--skin rash, itching, hives, swelling of the face, lips, tongue, or throat Heart rhythm changes--fast or irregular heartbeat, dizziness, feeling faint or lightheaded, chest pain, trouble breathing Side effects that usually do not require medical attention (report to your care team if they continue or are bothersome): Confusion Drowsiness Dry mouth Hallucinations Headache This list may not describe all possible side effects. Call your doctor for medical advice about side effects. You may report side effects to FDA at 1-800-FDA-1088. Where should I keep my medication? Keep out of the reach of children and pets. Store at room temperature between 15 and 30 degrees C (59 and 86 degrees F). Keep container tightly closed. Throw away any unused medication after the expiration date. NOTE: This sheet is a summary. It may not cover all possible information. If you have questions about this medicine, talk to your doctor, pharmacist, or health care provider.  2023 Elsevier/Gold Standard (2020-08-01 00:00:00)
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
## 2022-03-03 ENCOUNTER — Encounter: Payer: Self-pay | Admitting: Neurosurgery

## 2022-03-03 ENCOUNTER — Ambulatory Visit (INDEPENDENT_AMBULATORY_CARE_PROVIDER_SITE_OTHER): Payer: Medicare Other | Admitting: Neurosurgery

## 2022-03-03 VITALS — BP 112/72 | Temp 98.2°F | Ht 67.5 in | Wt 195.4 lb

## 2022-03-03 DIAGNOSIS — M5416 Radiculopathy, lumbar region: Secondary | ICD-10-CM

## 2022-03-03 NOTE — Progress Notes (Signed)
   DOS: 01/19/22 (L4-S1 decompression) 01/30/22 (evacuation of hematoma)  HISTORY OF PRESENT ILLNESS: 03/03/2022 [name] is status post the above surgeries.  She has no in her right leg, but her pain is much improved compared to preop.   PHYSICAL EXAMINATION:   Vitals:   03/03/22 0946  BP: 112/72  Temp: 98.2 F (36.8 C)   General: Patient is well developed, well nourished, calm, collected, and in no apparent distress.  NEUROLOGICAL:  General: In no acute distress.  Awake, alert, oriented to person, place, and time. Pupils equal round and reactive to light.   Strength:  Side Iliopsoas Quads Hamstring PF DF EHL  R '5 5 5 5 5 5  '$ L '5 5 5 5 5 4   '$ Incision c/d/i  She has diminished sensation on the right side in the S1 distribution  ROS (Neurologic): Negative except as noted above  IMAGING: No interval imaging to review   ASSESSMENT/PLAN:  Lisa [name] is doing well after lumbar decompression that required a takeback for evacuation of the hematoma.  She is much improved compared to her condition before her initial surgery.  She still has some persistent numbness.  Will not know for 6 to 12 months whether this will improve.  I would like to start her on outpatient physical therapy.  I will see her back in 6 weeks.  Meade Maw MD, [facility] Department of Neurosurgery

## 2022-04-02 ENCOUNTER — Ambulatory Visit (INDEPENDENT_AMBULATORY_CARE_PROVIDER_SITE_OTHER): Payer: Medicare Other | Admitting: Psychiatry

## 2022-04-02 ENCOUNTER — Encounter: Payer: Self-pay | Admitting: Psychiatry

## 2022-04-02 VITALS — BP 116/79 | HR 61 | Temp 97.8°F | Ht 67.0 in | Wt 193.0 lb

## 2022-04-02 DIAGNOSIS — F411 Generalized anxiety disorder: Secondary | ICD-10-CM

## 2022-04-02 DIAGNOSIS — F439 Reaction to severe stress, unspecified: Secondary | ICD-10-CM | POA: Diagnosis not present

## 2022-04-02 DIAGNOSIS — F3132 Bipolar disorder, current episode depressed, moderate: Secondary | ICD-10-CM

## 2022-04-02 MED ORDER — HYDROXYZINE HCL 25 MG PO TABS: 25.0000 mg | ORAL_TABLET | Freq: Two times a day (BID) | ORAL | 1 refills | Status: DC | PRN

## 2022-04-02 MED ORDER — LAMOTRIGINE 25 MG PO TABS: 25.0000 mg | ORAL_TABLET | Freq: Every day | ORAL | 0 refills | Status: DC

## 2022-04-02 MED ORDER — ARIPIPRAZOLE 2 MG PO TABS: 2.0000 mg | ORAL_TABLET | Freq: Every morning | ORAL | 1 refills | Status: DC

## 2022-04-02 NOTE — Progress Notes (Signed)
[facility] OP Progress Note  04/02/2022 9:39 AM Lisa [name]  MRN:  [id number]  Chief Complaint:  Chief Complaint  Patient presents with   Follow-up: 51 year old African-American female with history of bipolar disorder, anxiety, trauma related symptoms, and migraine headaches, chronic pain presented with worsening mood symptoms.   HPI: Lisa [name] is a 51 year old African-American female with history of bipolar disorder, GAD, trauma and stress related disorder, chronic migraine, obstructive sleep apnea on oxygen, generalized sensory motor polyneuropathy, chronic back pain with radiculopathy, hypovitaminosis-vitamin D and vitamin B6 deficiency, history of gastric bypass, status post L4-5 and L5-S1 decompression on 01/19/2022 status post exploration and evacuation of seroma, hematoma-01/30/2022-presented for medication management.  Patient today reports she continues to struggle with mood symptoms, sadness, low motivation, low energy, anhedonia, generalized anxiety about different things, worrying, being overwhelmed and so on.  Patient reports the Lamictal higher dosage did not help at all with her mood symptoms.  Patient continues to struggle with chronic pain as well as migraine headaches.  Patient was recently started on Nurtec, continues to follow-up with her provider.  Patient does struggle with sleep, likely multifactorial given her chronic pain and headaches.  Patient currently takes hydroxyzine as well as gabapentin at night which does not seem to help much.  Patient agreeable to work on sleep hygiene.  Patient denies any suicidality, homicidality or perceptual disturbances.  Patient denies side effects to Lamictal or the hydroxyzine.  Patient was unable to establish care with a therapist.  Denies any other concerns today.  Visit Diagnosis:    ICD-10-CM   1. Bipolar 1 disorder, depressed, moderate (HCC)  F31.32 ARIPiprazole (ABILIFY) 2 MG tablet    hydrOXYzine  (ATARAX) 25 MG tablet    lamoTRIgine (LAMICTAL) 25 MG tablet    2. GAD (generalized anxiety disorder)  F41.1 hydrOXYzine (ATARAX) 25 MG tablet    3. Trauma and stressor-related disorder  F43.9    unspecified , R/O PTSD      Past Psychiatric History: I have reviewed past psychiatric history from progress note on 02/18/2022.  Past trials of medications like risperidone, Invega, Celexa, lithium, Topamax, hydroxyzine, trazodone.  Past Medical History:  Past Medical History:  Diagnosis Date   Abdominal pain    d/t ongoing menstrual bleeding   Anemia    iron deficiency, receiving Feraheme transfusions   Arthritis    Asthma    nonspecific interstitial pneumonia.Marland Kitchendiagnosed 6 years ago   Bipolar 1 disorder ([place])    Blood in stool    happens often but unsure of etiology.  will have colonoscopy   Change in voice    Chest pain    has often but is due to lung disease   Constipation    Depression    Diverticulitis    DVT (deep venous thrombosis) ([place]) 12/13/2014   Dysrhythmia    Fatigue    Gallstones 04/10/2011   with biliary pancreatitis   GERD (gastroesophageal reflux disease)    History of blood transfusion 02/2019   one unit d/t hgb being low   History of hiatal hernia    small   History of kidney stones    History of methicillin resistant staphylococcus aureus (MRSA)    Hypertension    Idiopathic non-specific interstitial pneumonitis (HCC)    Migraines    MRSA carrier    many years ago.   Nausea    Pancreatitis    Pre-diabetes    PTSD (post-traumatic stress disorder)    Pulmonary embolism ([place]) 12/13/2014  Sleep apnea    does not use cpap now.  will be retested soon   Takes iron supplements    FERAHEME weekly transfusions as well as ferrous sulfate PO   Trouble swallowing    Vocal cord polyps     Past Surgical History:  Procedure Laterality Date   BACK SURGERY     BRAVO [place] STUDY  06/02/2011   Procedure: BRAVO [place];  Surgeon: Inda Castle, MD;  Location: [facility]  ENDOSCOPY;  Service: Endoscopy;  Laterality: N/A;   COLONOSCOPY     CYSTOSCOPY  08/15/2019   Procedure: CYSTOSCOPY;  Surgeon: Malachy Mood, MD;  Location: [facility] ORS;  Service: Gynecology;;   DILATION AND CURETTAGE OF UTERUS     ENDOMETRIAL ABLATION N/A 03/30/2019   Procedure: ENDOMETRIAL ABLATION;  Surgeon: Malachy Mood, MD;  Location: [facility] ORS;  Service: Gynecology;  Laterality: N/A;   ESOPHAGOGASTRODUODENOSCOPY  06/02/2011   Procedure: ESOPHAGOGASTRODUODENOSCOPY (EGD);  Surgeon: Inda Castle, MD;  Location: [facility];  Service: Endoscopy;  Laterality: N/A;   GASTRIC BYPASS  2021   HYSTEROSCOPY  03/30/2019   Procedure: HYSTEROSCOPY;  Surgeon: Malachy Mood, MD;  Location: [facility] ORS;  Service: Gynecology;;   LAPAROSCOPIC CHOLECYSTECTOMY W/ CHOLANGIOGRAPHY  04/25/2011   Dr Margot Chimes   LUMBAR LAMINECTOMY FOR EPIDURAL ABSCESS N/A 01/30/2022   Procedure: LUMBAR LAMINECTOMY FOR EPIDURAL ABSCESS;  Surgeon: Meade Maw, MD;  Location: [facility] ORS;  Service: Neurosurgery;  Laterality: N/A;  Per consent and verbal confirmation with the surgeon, the procedure performed was wound exploration, evacuation of epidural fluid collection   LUMBAR LAMINECTOMY/DECOMPRESSION MICRODISCECTOMY N/A 01/19/2022   Procedure: L4-S1 DECOMPRESSION;  Surgeon: Meade Maw, MD;  Location: [facility] ORS;  Service: Neurosurgery;  Laterality: N/A;   NASAL POLYP SURGERY     polyp from vocal cords   TOTAL LAPAROSCOPIC HYSTERECTOMY WITH SALPINGECTOMY Bilateral 08/15/2019   Procedure: TOTAL LAPAROSCOPIC HYSTERECTOMY WITH SALPINGECTOMY;  Surgeon: Malachy Mood, MD;  Location: [facility] ORS;  Service: Gynecology;  Laterality: Bilateral;   TUBAL LIGATION  1996   WISDOM TOOTH EXTRACTION      Family Psychiatric History: Reviewed family psychiatric history from progress note on 02/18/2022.  Family History:  Family History  Problem Relation Age of Onset   Bipolar disorder Mother    Diabetes Mother    Pancreatic  cancer Mother    Alcohol abuse Father    Lung cancer Father    Bipolar disorder Sister    Alcohol abuse Paternal Aunt    Cancer Paternal Aunt 0   Alcohol abuse Paternal Uncle    Alcohol abuse Paternal Grandfather    Bipolar disorder Niece    Anesthesia problems Neg Hx    Hypotension Neg Hx    Malignant hyperthermia Neg Hx    Pseudochol deficiency Neg Hx     Social History: Reviewed social history from progress note on 02/18/2022. Social History   Socioeconomic History   Marital status: Divorced    Spouse name: Not on file   Number of children: 7   Years of education: Not on file   Highest education level: Some college, no degree  Occupational History    Comment: disability  Tobacco Use   Smoking status: Never   Smokeless tobacco: Never  Vaping Use   Vaping Use: Never used  Substance and Sexual Activity   Alcohol use: Not Currently    Comment: rare   Drug use: No   Sexual activity: Yes    Partners: Male    Birth control/protection: Surgical  Other Topics  Concern   Not on file  Social History Narrative   In [place]; with 2 sons; used to work in hospitality. Never smoked; ocassional alcohol.    Social Determinants of Health   Financial Resource Strain: Not on file  Food Insecurity: Not on file  Transportation Needs: Not on file  Physical Activity: Not on file  Stress: Not on file  Social Connections: Not on file    Allergies:  Allergies  Allergen Reactions   Lisinopril Anaphylaxis    Facial swelling, difficulty breathing   Morphine And Related Other (See Comments)    migraine    Metabolic Disorder Labs: Lab Results  Component Value Date   HGBA1C 5.6 03/18/2014   Lab Results  Component Value Date   PROLACTIN 24.2 (H) 03/03/2019   No results found for: "CHOL", "TRIG", "HDL", "CHOLHDL", "VLDL", "LDLCALC" Lab Results  Component Value Date   TSH 1.888 03/02/2019    Therapeutic Level Labs: No results found for: "LITHIUM" No results found for:  "VALPROATE" No results found for: "CBMZ"  Current Medications: Current Outpatient Medications  Medication Sig Dispense Refill   AIMOVIG 140 MG/ML SOAJ Inject 140 mg into the skin every 30 (thirty) days.     albuterol (PROVENTIL HFA;VENTOLIN HFA) 108 (90 Base) MCG/ACT inhaler Inhale 2 puffs into the lungs every 6 (six) hours as needed for wheezing or shortness of breath. 1 Inhaler 2   ARIPiprazole (ABILIFY) 2 MG tablet Take 1 tablet (2 mg total) by mouth in the morning. 30 tablet 1   azaTHIOprine (IMURAN) 50 MG tablet Take 50 mg by mouth every morning.     budesonide (PULMICORT) 180 MCG/ACT inhaler Inhale 2 puffs into the lungs 2 (two) times daily.     celecoxib (CELEBREX) 200 MG capsule Take 1 capsule (200 mg total) by mouth 2 (two) times daily. 60 capsule 0   gabapentin (NEURONTIN) 600 MG tablet Take 1 tablet (600 mg total) by mouth 3 (three) times daily. 90 tablet 1   Ipratropium-Albuterol (COMBIVENT) 20-100 MCG/ACT AERS respimat Inhale 1 puff into the lungs every morning.     lamoTRIgine (LAMICTAL) 150 MG tablet Take 0.5 tablets (75 mg total) by mouth 2 (two) times daily. Take along with 25 mg daily - total of 175 mg daily 90 tablet 0   methocarbamol (ROBAXIN) 500 MG tablet Take 1 tablet (500 mg total) by mouth every 6 (six) hours. 120 tablet 0   Multiple Vitamin (MULTIVITAMIN) tablet Take 1 tablet by mouth daily.     ondansetron (ZOFRAN-ODT) 4 MG disintegrating tablet Take 4-8 mg by mouth every 8 (eight) hours as needed.     pantoprazole (PROTONIX) 40 MG tablet Take 1 tablet (40 mg total) by mouth every morning. 30 tablet 11   Rimegepant Sulfate 75 MG TBDP Take by mouth.     rizatriptan (MAXALT) 10 MG tablet Take by mouth.     SUMAtriptan (IMITREX) 100 MG tablet Take 100 mg by mouth every 2 (two) hours as needed for migraine.      valACYclovir (VALTREX) 1000 MG tablet Take 1,000 mg by mouth 2 (two) times daily.     hydrOXYzine (ATARAX) 25 MG tablet Take 1 tablet (25 mg total) by mouth 2  (two) times daily as needed for anxiety. And sleep 60 tablet 1   lamoTRIgine (LAMICTAL) 25 MG tablet Take 1 tablet (25 mg total) by mouth daily. Take along with 150 mg daily 90 tablet 0   No current facility-administered medications for this visit.  Musculoskeletal: Strength & Muscle Tone: within normal limits Gait & Station: normal Patient leans: N/A  Psychiatric Specialty Exam: Review of Systems  Musculoskeletal:  Positive for back pain.  Neurological:  Positive for headaches (chronic).  Psychiatric/Behavioral:  Positive for decreased concentration, dysphoric mood and sleep disturbance. The patient is nervous/anxious.   All other systems reviewed and are negative.   Blood pressure 116/79, pulse 61, temperature 97.8 F (36.6 C), temperature source Temporal, height '5\' 7"'$  (1.702 m), weight 193 lb (87.5 kg), last menstrual period 07/17/2019, SpO2 96 %.Body mass index is 30.23 kg/m.  General Appearance: Casual  Eye Contact:  Fair  Speech:  Clear and Coherent  Volume:  Normal  Mood:  Anxious and Depressed  Affect:  Restricted  Thought Process:  Goal Directed and Descriptions of Associations: Intact  Orientation:  Full (Time, Place, and Person)  Thought Content: Logical   Suicidal Thoughts:  No  Homicidal Thoughts:  No  Memory:  Immediate;   Fair Recent;   Fair Remote;   Fair  Judgement:  Fair  Insight:  Fair  Psychomotor Activity:  Normal  Concentration:  Concentration: Fair and Attention Span: Fair  Recall:  [employer] of Knowledge: Fair  Language: Fair  Akathisia:  No  Handed:  Right  AIMS (if indicated): not done  Assets:  Communication Skills Desire for Improvement Housing Social Support  ADL's:  Intact  Cognition: WNL  Sleep:  Poor   Screenings: AUDIT    Flowsheet Row Admission (Discharged) from 05/11/2013 in [facility]  Alcohol Use Disorder Identification Test Final Score (AUDIT) 0      GAD-7    Flowsheet Row  Office Visit from 04/02/2022 in [facility] Office Visit from 02/18/2022 in [facility]  Total GAD-7 Score 19 21      PHQ2-9    [place] Office Visit from 04/02/2022 in [place] Office Visit from 02/18/2022 in [facility] from [facility]/10/2017 in [facility]  PHQ-2 Total Score '6 6 5  '$ PHQ-9 Total Score '19 18 22      '$ [place] Office Visit from 04/02/2022 in [facility] Office Visit from 02/18/2022 in [facility]-Admission (Discharged) from 01/30/2022 in [facility] (1A)  C-SSRS RISK CATEGORY No Risk No Risk No Risk        Assessment and Plan: Ashleynicole Mcclees is a 51 year old African-American female on disability, history of bipolar disorder, GAD, trauma and stress related disorder-rule out PTSD, presented for medication management.  Patient continues to struggle with mood symptoms, sleep problems and has comorbid chronic pain, will benefit from the following plan.  Plan Bipolar disorder type I most recent depressed moderate-unstable Continue lamotrigine 175 mg p.o. daily Patient is also on gabapentin for neuropathy which is also a mood stabilizer. Start Abilify 2 mg p.o. daily in the morning Provided medication education including weight gain side effects, metabolic syndrome, cardiac effect, tardive dyskinesia.  GAD-unstable Patient advised to establish care with therapist We will consider an SSRI in the future.  Trauma and stress related disorder-rule out PTSD-unstable Referred for CBT Continue hydroxyzine 25 mg p.o. twice daily as needed Patient to keep a log of her sleep, will consider a sleep aid as needed.   I have reviewed EKG dated 01/26/2022-409-QTc.  Follow-up in clinic in 4 to 6 weeks or sooner if  needed.   This note  was generated in part or whole with voice recognition software. Voice recognition is usually quite accurate but there are transcription errors that can and very often do occur. I apologize for any typographical errors that were not detected and corrected.      Ursula Alert, MD 04/03/2022, 8:06 AM

## 2022-04-02 NOTE — Patient Instructions (Signed)
Aripiprazole Tablets What is this medication? ARIPIPRAZOLE (ay ri PIP ray zole) treats schizophrenia, bipolar I disorder, autism spectrum disorder, and Tourette disorder. It may also be used with antidepressant medications to treat depression. It works by balancing the levels of dopamine and serotonin in the brain, hormones that help regulate mood, behaviors, and thoughts. It belongs to a group of medications called antipsychotics. Antipsychotics can be used to treat several kinds of mental health conditions. This medicine may be used for other purposes; ask your health care provider or pharmacist if you have questions. COMMON BRAND NAME(S): Abilify What should I tell my care team before I take this medication? They need to know if you have any of these conditions: Dementia Diabetes Difficulty swallowing Have trouble controlling your muscles Have urges you are unable to control (for example, gambling, spending money, or eating) Heart disease History of irregular heartbeat History of stroke Low blood counts, like low white cell, platelet, or red cell counts Low blood pressure Parkinson disease Seizures Suicidal thoughts, plans or attempt; a previous suicide attempt by you or a family member An unusual or allergic reaction to aripiprazole, other medications, foods, dyes, or preservatives Pregnant or trying to get pregnant Breast-feeding How should I use this medication? Take this medication by mouth with a glass of water. Follow the directions on the prescription label. You can take this medication with or without food. Take your doses at regular intervals. Do not take your medication more often than directed. Do not stop taking except on the advice of your care team. A special MedGuide will be given to you by the pharmacist with each prescription and refill. Be sure to read this information carefully each time. Talk to your care team regarding the use of this medication in children. While  this medication may be prescribed for children as young as 6 years of age for selected conditions, precautions do apply. Overdosage: If you think you have taken too much of this medicine contact a poison control center or emergency room at once. NOTE: This medicine is only for you. Do not share this medicine with others. What if I miss a dose? If you miss a dose, take it as soon as you can. If it is almost time for your next dose, take only that dose. Do not take double or extra doses. What may interact with this medication? Do not take this medication with any of the following: Brexpiprazole Cisapride Dextromethorphan; quinidine Dronedarone Metoclopramide Pimozide Quinidine Thioridazine This medication may also interact with the following: Antihistamines for allergy, cough, and cold Carbamazepine Certain medications for anxiety or sleep Certain medications for depression like amitriptyline, fluoxetine, paroxetine, sertraline Certain medications for fungal infections like fluconazole, itraconazole, ketoconazole, posaconazole, voriconazole Clarithromycin General anesthetics like halothane, isoflurane, methoxyflurane, propofol Levodopa or other medications for Parkinson's disease Medications for blood pressure Medications for seizures Medications that relax muscles for surgery Narcotic medications for pain Other medications that prolong the QT interval (cause an abnormal heart rhythm) Phenothiazines like chlorpromazine, prochlorperazine Rifampin This list may not describe all possible interactions. Give your health care provider a list of all the medicines, herbs, non-prescription drugs, or dietary supplements you use. Also tell them if you smoke, drink alcohol, or use illegal drugs. Some items may interact with your medicine. What should I watch for while using this medication? Visit your care team for regular checks on your progress. Tell your care team if symptoms do not start to  get better or if they get worse. Do not   stop taking except on your care team's advice. You may develop a severe reaction. Your care team will tell you how much medication to take. Patients and their families should watch out for new or worsening depression or thoughts of suicide. Also watch out for sudden changes in feelings such as feeling anxious, agitated, panicky, irritable, hostile, aggressive, impulsive, severely restless, overly excited and hyperactive, or not being able to sleep. If this happens, especially at the beginning of antidepressant treatment or after a change in dose, call your care team. You may get dizzy or drowsy. Do not drive, use machinery, or do anything that needs mental alertness until you know how this medication affects you. Do not stand or sit up quickly, especially if you are an older patient. This reduces the risk of dizzy or fainting spells. Alcohol may interfere with the effect of this medication. Avoid alcoholic drinks. This medication can cause problems with controlling your body temperature. It can lower the response of your body to cold temperatures. If possible, stay indoors during cold weather. If you must go outdoors, wear warm clothes. It can also lower the response of your body to heat. Do not overheat. Do not over-exercise. Stay out of the sun when possible. If you must be in the sun, wear cool clothing. Drink plenty of water. If you have trouble controlling your body temperature, call your care team right away. This medication may cause dry eyes and blurred vision. If you wear contact lenses, you may feel some discomfort. Lubricating drops may help. See your eye care specialist if the problem does not go away or is severe. This medication may increase blood sugar. Ask your care team if changes in diet or medications are needed if you have diabetes. There have been reports of increased sexual urges or other strong urges such as gambling while taking this medication.  If you experience any of these while taking this medication, you should report this to your care team as soon as possible. What side effects may I notice from receiving this medication? Side effects that you should report to your care team as soon as possible: Allergic reactions--skin rash, itching, hives, swelling of the face, lips, tongue, or throat High blood sugar (hyperglycemia)--increased thirst or amount of urine, unusual weakness or fatigue, blurry vision High fever, stiff muscles, increased sweating, fast or irregular heartbeat, and confusion, which may be signs of neuroleptic malignant syndrome Low blood pressure--dizziness, feeling faint or lightheaded, blurry vision Pain or trouble swallowing Prolonged or painful erection Seizures Stroke--sudden numbness or weakness of the face, arm, or leg, trouble speaking, confusion, trouble walking, loss of balance or coordination, dizziness, severe headache, change in vision Uncontrolled and repetitive body movements, muscle stiffness or spasms, tremors or shaking, loss of balance or coordination, restlessness, shuffling walk, which may be signs of extrapyramidal symptoms (EPS) Thoughts of suicide or self-harm, worsening mood, feelings of depression Urges to engage in impulsive behaviors such as gambling, binge eating, sexual activity, or shopping in ways that are unusual for you Side effects that usually do not require medical attention (report these to your care team if they continue or are bothersome): Constipation Drowsiness Weight gain This list may not describe all possible side effects. Call your doctor for medical advice about side effects. You may report side effects to FDA at 1-800-FDA-1088. Where should I keep my medication? Keep out of the reach of children and pets. Store at room temperature between 15 and 30 degrees C (59 and 86 degrees F).   Throw away any unused medication after the expiration date. NOTE: This sheet is a summary.  It may not cover all possible information. If you have questions about this medicine, talk to your doctor, pharmacist, or health care provider.  2023 Elsevier/Gold Standard (2020-08-01 00:00:00)

## 2022-04-02 NOTE — Progress Notes (Signed)
116/79- BP

## 2022-04-07 ENCOUNTER — Encounter: Payer: Medicare Other | Admitting: Neurosurgery

## 2022-04-13 ENCOUNTER — Telehealth: Payer: Self-pay

## 2022-04-13 DIAGNOSIS — Z9889 Other specified postprocedural states: Secondary | ICD-10-CM

## 2022-04-13 MED ORDER — GABAPENTIN 600 MG PO TABS: 600.0000 mg | ORAL_TABLET | Freq: Three times a day (TID) | ORAL | 2 refills | Status: DC

## 2022-04-13 NOTE — Telephone Encounter (Signed)
Patient requesting a refill of Gabapentin. Refill sent in.

## 2022-04-16 DIAGNOSIS — Z7960 Long term (current) use of unspecified immunomodulators and immunosuppressants: Secondary | ICD-10-CM | POA: Insufficient documentation

## 2022-04-16 DIAGNOSIS — D84821 Immunodeficiency due to drugs: Secondary | ICD-10-CM | POA: Insufficient documentation

## 2022-05-01 ENCOUNTER — Other Ambulatory Visit: Payer: Self-pay | Admitting: Family Medicine

## 2022-05-01 DIAGNOSIS — N644 Mastodynia: Secondary | ICD-10-CM

## 2022-05-11 ENCOUNTER — Encounter (INDEPENDENT_AMBULATORY_CARE_PROVIDER_SITE_OTHER): Payer: Self-pay

## 2022-05-14 ENCOUNTER — Ambulatory Visit (INDEPENDENT_AMBULATORY_CARE_PROVIDER_SITE_OTHER): Payer: Medicare Other | Admitting: Psychiatry

## 2022-05-14 ENCOUNTER — Encounter: Payer: Self-pay | Admitting: Psychiatry

## 2022-05-14 VITALS — BP 109/75 | HR 59 | Temp 99.2°F | Ht 67.0 in | Wt 207.2 lb

## 2022-05-14 DIAGNOSIS — F439 Reaction to severe stress, unspecified: Secondary | ICD-10-CM | POA: Diagnosis not present

## 2022-05-14 DIAGNOSIS — F411 Generalized anxiety disorder: Secondary | ICD-10-CM

## 2022-05-14 DIAGNOSIS — F3132 Bipolar disorder, current episode depressed, moderate: Secondary | ICD-10-CM

## 2022-05-14 MED ORDER — TRAZODONE HCL 100 MG PO TABS: 100.0000 mg | ORAL_TABLET | Freq: Every day | ORAL | 0 refills | Status: DC

## 2022-05-14 MED ORDER — ARIPIPRAZOLE 2 MG PO TABS: 2.0000 mg | ORAL_TABLET | Freq: Every morning | ORAL | 0 refills | Status: DC

## 2022-05-14 NOTE — Patient Instructions (Signed)
Insomnia Insomnia is a sleep disorder that makes it difficult to fall asleep or stay asleep. Insomnia can cause fatigue, low energy, difficulty concentrating, mood swings, and poor performance at work or school. There are three different ways to classify insomnia: Difficulty falling asleep. Difficulty staying asleep. Waking up too early in the morning. Any type of insomnia can be long-term (chronic) or short-term (acute). Both are common. Short-term insomnia usually lasts for 3 months or less. Chronic insomnia occurs at least three times a week for longer than 3 months. What are the causes? Insomnia may be caused by another condition, situation, or substance, such as: Having certain mental health conditions, such as anxiety and depression. Using caffeine, alcohol, tobacco, or drugs. Having gastrointestinal conditions, such as gastroesophageal reflux disease (GERD). Having certain medical conditions. These include: Asthma. Alzheimer's disease. Stroke. Chronic pain. An overactive thyroid gland (hyperthyroidism). Other sleep disorders, such as restless legs syndrome and sleep apnea. Menopause. Sometimes, the cause of insomnia may not be known. What increases the risk? Risk factors for insomnia include: Gender. Females are affected more often than males. Age. Insomnia is more common as people get older. Stress and certain medical and mental health conditions. Lack of exercise. Having an irregular work schedule. This may include working night shifts and traveling between different time zones. What are the signs or symptoms? If you have insomnia, the main symptom is having trouble falling asleep or having trouble staying asleep. This may lead to other symptoms, such as: Feeling tired or having low energy. Feeling nervous about going to sleep. Not feeling rested in the morning. Having trouble concentrating. Feeling irritable, anxious, or depressed. How is this diagnosed? This condition  may be diagnosed based on: Your symptoms and medical history. Your health care provider may ask about: Your sleep habits. Any medical conditions you have. Your mental health. A physical exam. How is this treated? Treatment for insomnia depends on the cause. Treatment may focus on treating an underlying condition that is causing the insomnia. Treatment may also include: Medicines to help you sleep. Counseling or therapy. Lifestyle adjustments to help you sleep better. Follow these instructions at home: Eating and drinking  Limit or avoid alcohol, caffeinated beverages, and products that contain nicotine and tobacco, especially close to bedtime. These can disrupt your sleep. Do not eat a large meal or eat spicy foods right before bedtime. This can lead to digestive discomfort that can make it hard for you to sleep. Sleep habits  Keep a sleep diary to help you and your health care provider figure out what could be causing your insomnia. Write down: When you sleep. When you wake up during the night. How well you sleep and how rested you feel the next day. Any side effects of medicines you are taking. What you eat and drink. Make your bedroom a dark, comfortable place where it is easy to fall asleep. Put up shades or blackout curtains to block light from outside. Use a white noise machine to block noise. Keep the temperature cool. Limit screen use before bedtime. This includes: Not watching TV. Not using your smartphone, tablet, or computer. Stick to a routine that includes going to bed and waking up at the same times every day and night. This can help you fall asleep faster. Consider making a quiet activity, such as reading, part of your nighttime routine. Try to avoid taking naps during the day so that you sleep better at night. Get out of bed if you are still awake after   15 minutes of trying to sleep. Keep the lights down, but try reading or doing a quiet activity. When you feel  sleepy, go back to bed. General instructions Take over-the-counter and prescription medicines only as told by your health care provider. Exercise regularly as told by your health care provider. However, avoid exercising in the hours right before bedtime. Use relaxation techniques to manage stress. Ask your health care provider to suggest some techniques that may work well for you. These may include: Breathing exercises. Routines to release muscle tension. Visualizing peaceful scenes. Make sure that you drive carefully. Do not drive if you feel very sleepy. Keep all follow-up visits. This is important. Contact a health care provider if: You are tired throughout the day. You have trouble in your daily routine due to sleepiness. You continue to have sleep problems, or your sleep problems get worse. Get help right away if: You have thoughts about hurting yourself or someone else. Get help right away if you feel like you may hurt yourself or others, or have thoughts about taking your own life. Go to your nearest emergency room or: Call 911. Call the [facility] at [phone] or 988. This is open 24 hours a day. Text the Crisis Text Line at 539-665-1324. Summary Insomnia is a sleep disorder that makes it difficult to fall asleep or stay asleep. Insomnia can be long-term (chronic) or short-term (acute). Treatment for insomnia depends on the cause. Treatment may focus on treating an underlying condition that is causing the insomnia. Keep a sleep diary to help you and your health care provider figure out what could be causing your insomnia. This information is not intended to replace advice given to you by your health care provider. Make sure you discuss any questions you have with your health care provider. Document Revised: 06/09/2021 Document Reviewed: 06/09/2021 Elsevier Patient Education  [place].   Trazodone Tablets What is this medication? TRAZODONE  (TRAZ oh done) treats depression. It increases the amount of serotonin in the brain, a hormone that helps regulate mood. This medicine may be used for other purposes; ask your health care provider or pharmacist if you have questions. COMMON BRAND NAME(S): Desyrel What should I tell my care team before I take this medication? They need to know if you have any of these conditions: Attempted suicide or thinking about it Bipolar disorder Bleeding problems Glaucoma Heart disease, or previous heart attack Irregular heart beat Kidney or liver disease Low levels of sodium in the blood An unusual or allergic reaction to trazodone, other medications, foods, dyes or preservatives Pregnant or trying to get pregnant Breast-feeding How should I use this medication? Take this medication by mouth with a glass of water. Follow the directions on the prescription label. Take this medication shortly after a meal or a light snack. Take your medication at regular intervals. Do not take your medication more often than directed. Do not stop taking this medication suddenly except upon the advice of your care team. Stopping this medication too quickly may cause serious side effects or your condition may worsen. A special MedGuide will be given to you by the pharmacist with each prescription and refill. Be sure to read this information carefully each time. Talk to your care team regarding the use of this medication in children. Special care may be needed. Overdosage: If you think you have taken too much of this medicine contact a poison control center or emergency room at once. NOTE: This medicine is only  for you. Do not share this medicine with others. What if I miss a dose? If you miss a dose, take it as soon as you can. If it is almost time for your next dose, take only that dose. Do not take double or extra doses. What may interact with this medication? Do not take this medication with any of the  following: Certain medications for fungal infections like fluconazole, itraconazole, ketoconazole, posaconazole, voriconazole Cisapride Dronedarone Linezolid MAOIs like Carbex, Eldepryl, Marplan, Nardil, and Parnate Mesoridazine Methylene blue (injected into a vein) Pimozide Saquinavir Thioridazine This medication may also interact with the following: Alcohol Antiviral medications for HIV or AIDS Aspirin and aspirin-like medications Barbiturates like phenobarbital Certain medications for blood pressure, heart disease, irregular heart beat Certain medications for depression, anxiety, or psychotic disturbances Certain medications for migraine headache like almotriptan, eletriptan, frovatriptan, naratriptan, rizatriptan, sumatriptan, zolmitriptan Certain medications for seizures like carbamazepine and phenytoin Certain medications for sleep Certain medications that treat or prevent blood clots like dalteparin, enoxaparin, warfarin Digoxin Fentanyl Lithium NSAIDS, medications for pain and inflammation, like ibuprofen or naproxen Other medications that prolong the QT interval (cause an abnormal heart rhythm) like dofetilide Rasagiline Supplements like St. John's wort, kava kava, valerian Tramadol Tryptophan This list may not describe all possible interactions. Give your health care provider a list of all the medicines, herbs, non-prescription drugs, or dietary supplements you use. Also tell them if you smoke, drink alcohol, or use illegal drugs. Some items may interact with your medicine. What should I watch for while using this medication? Tell your care team if your symptoms do not get better or if they get worse. Visit your care team for regular checks on your progress. Because it may take several weeks to see the full effects of this medication, it is important to continue your treatment as prescribed by your care team. Watch for new or worsening thoughts of suicide or depression.  This includes sudden changes in mood, behaviors, or thoughts. These changes can happen at any time but are more common in the beginning of treatment or after a change in dose. Call your care team right away if you experience these thoughts or worsening depression. Manic episodes may happen in patients with bipolar disorder who take this medication. Watch for changes in feelings or behaviors such as feeling anxious, nervous, agitated, panicky, irritable, hostile, aggressive, impulsive, severely restless, overly excited and hyperactive, or trouble sleeping. These changes can happen at any time but are more common in the beginning of treatment or after a change in dose. Call your care team right away if you notice any of these symptoms. You may get drowsy or dizzy. Do not drive, use machinery, or do anything that needs mental alertness until you know how this medication affects you. Do not stand or sit up quickly, especially if you are an older patient. This reduces the risk of dizzy or fainting spells. Alcohol may interfere with the effect of this medication. Avoid alcoholic drinks. This medication may cause dry eyes and blurred vision. If you wear contact lenses you may feel some discomfort. Lubricating drops may help. See your eye doctor if the problem does not go away or is severe. Your mouth may get dry. Chewing sugarless gum, sucking hard candy and drinking plenty of water may help. Contact your care team if the problem does not go away or is severe. What side effects may I notice from receiving this medication? Side effects that you should report to your care team  as soon as possible: Allergic reactions--skin rash, itching, hives, swelling of the face, lips, tongue, or throat Bleeding--bloody or black, tar-like stools, red or dark brown urine, vomiting blood or brown material that looks like coffee grounds, small, red or purple spots on skin, unusual bleeding or bruising Heart rhythm changes--fast or  irregular heartbeat, dizziness, feeling faint or lightheaded, chest pain, trouble breathing Low blood pressure--dizziness, feeling faint or lightheaded, blurry vision Low sodium level--muscle weakness, fatigue, dizziness, headache, confusion Prolonged or painful erection Serotonin syndrome--irritability, confusion, fast or irregular heartbeat, muscle stiffness, twitching muscles, sweating, high fever, seizures, chills, vomiting, diarrhea Sudden eye pain or change in vision such as blurry vision, seeing halos around lights, vision loss Thoughts of suicide or self-harm, worsening mood, feelings of depression Side effects that usually do not require medical attention (report to your care team if they continue or are bothersome): Change in sex drive or performance Constipation Dizziness Drowsiness Dry mouth This list may not describe all possible side effects. Call your doctor for medical advice about side effects. You may report side effects to FDA at 1-800-FDA-1088. Where should I keep my medication? Keep out of the reach of children and pets. Store at room temperature between 15 and 30 degrees C (59 to 86 degrees F). Protect from light. Keep container tightly closed. Throw away any unused medication after the expiration date. NOTE: This sheet is a summary. It may not cover all possible information. If you have questions about this medicine, talk to your doctor, pharmacist, or health care provider.  2023 Elsevier/Gold Standard (2020-06-19 00:00:00)

## 2022-05-14 NOTE — Progress Notes (Signed)
[facility] OP Progress Note  05/14/2022 12:49 PM Lisa [name]  MRN:  [id number]  Chief Complaint:  Chief Complaint  Patient presents with   Follow-up   Anxiety   Depression   Medication Refill   HPI: Lisa [name] is a 51 year old African-American female with history of bipolar disorder, GAD, trauma and stress related disorder, chronic migraine, obstructive sleep apnea on oxygen, generalized sensory motor polyneuropathy, chronic back pain with radiculopathy, hypovitaminosis, vitamin D and vitamin B6 deficiency, history of gastric bypass, status post L4-5 and L5-S1 decompression on 01/19/2022 status post exploration, evacuation of seroma, hematoma-01/30/2022, presented for medication management.  Patient today to be pleasant, smiling, quite different from her previous presentation.  Reports however she has not noticed much benefit in her mood symptoms with the Abilify although she is tolerating it well.  Denies any side effects.  Continues to have low motivation, low energy, concentration problems, sadness, likely also exacerbated by her sleep issues.  Patient continues to be in significant pain of her back as well as has chronic headaches.  Patient is currently compliant on medications.  Denies side effects.  Denies any suicidality, homicidality or perceptual disturbances.  Denies any other concerns today.  Visit Diagnosis:    ICD-10-CM   1. Bipolar 1 disorder, depressed, moderate (HCC)  F31.32 traZODone (DESYREL) 100 MG tablet    ARIPiprazole (ABILIFY) 2 MG tablet    2. GAD (generalized anxiety disorder)  F41.1 traZODone (DESYREL) 100 MG tablet    3. Trauma and stressor-related disorder  F43.9    R/O PTSD      Past Psychiatric History: Reviewed past psychiatric history from progress note on 02/19/2019.  Past trials of medications like risperidone, Invega, Celexa, lithium, Topamax, hydroxyzine, trazodone.  Past Medical History:  Past Medical History:   Diagnosis Date   Abdominal pain    d/t ongoing menstrual bleeding   Anemia    iron deficiency, receiving Feraheme transfusions   Arthritis    Asthma    nonspecific interstitial pneumonia.Marland Kitchendiagnosed 6 years ago   Bipolar 1 disorder ([place])    Blood in stool    happens often but unsure of etiology.  will have colonoscopy   Change in voice    Chest pain    has often but is due to lung disease   Constipation    Depression    Diverticulitis    DVT (deep venous thrombosis) ([place]) 12/13/2014   Dysrhythmia    Fatigue    Gallstones 04/10/2011   with biliary pancreatitis   GERD (gastroesophageal reflux disease)    History of blood transfusion 02/2019   one unit d/t hgb being low   History of hiatal hernia    small   History of kidney stones    History of methicillin resistant staphylococcus aureus (MRSA)    Hypertension    Idiopathic non-specific interstitial pneumonitis (HCC)    Migraines    MRSA carrier    many years ago.   Nausea    Pancreatitis    Pre-diabetes    PTSD (post-traumatic stress disorder)    Pulmonary embolism ([place]) 12/13/2014   Sleep apnea    does not use cpap now.  will be retested soon   Takes iron supplements    FERAHEME weekly transfusions as well as ferrous sulfate PO   Trouble swallowing    Vocal cord polyps     Past Surgical History:  Procedure Laterality Date   BACK SURGERY     BRAVO [facility] STUDY  06/02/2011  Procedure: BRAVO [place] STUDY;  Surgeon: Inda Castle, MD;  Location: [facility];  Service: Endoscopy;  Laterality: N/A;   COLONOSCOPY     CYSTOSCOPY  08/15/2019   Procedure: CYSTOSCOPY;  Surgeon: Malachy Mood, MD;  Location: [facility] ORS;  Service: Gynecology;;   DILATION AND CURETTAGE OF UTERUS     ENDOMETRIAL ABLATION N/A 03/30/2019   Procedure: ENDOMETRIAL ABLATION;  Surgeon: Malachy Mood, MD;  Location: [facility] ORS;  Service: Gynecology;  Laterality: N/A;   ESOPHAGOGASTRODUODENOSCOPY  06/02/2011   Procedure: ESOPHAGOGASTRODUODENOSCOPY  (EGD);  Surgeon: Inda Castle, MD;  Location: [facility];  Service: Endoscopy;  Laterality: N/A;   GASTRIC BYPASS  2021   HYSTEROSCOPY  03/30/2019   Procedure: HYSTEROSCOPY;  Surgeon: Malachy Mood, MD;  Location: [facility] OR[facility];  Service: Gynecology;;   LAPAROSCOPIC CHOLECYSTECTOMY W/ CHOLANGIOGRAPHY  04/25/2011   Dr Margot Chimes   LUMBAR LAMINECTOMY FOR EPIDURAL ABSCESS N/A 01/30/2022   Procedure: LUMBAR LAMINECTOMY FOR EPIDURAL ABSCESS;  Surgeon: Meade Maw, MD;  Location: [facility] ORS;  Service: Neurosurgery;  Laterality: N/A;  Per consent and verbal confirmation with the surgeon, the procedure performed was wound exploration, evacuation of epidural fluid collection   LUMBAR LAMINECTOMY/DECOMPRESSION MICRODISCECTOMY N/A 01/19/2022   Procedure: L4-S1 DECOMPRESSION;  Surgeon: Meade Maw, MD;  Location: [facility];  Service: Neurosurgery;  Laterality: N/A;   NASAL POLYP SURGERY     polyp from vocal cords   TOTAL LAPAROSCOPIC HYSTERECTOMY WITH SALPINGECTOMY Bilateral 08/15/2019   Procedure: TOTAL LAPAROSCOPIC HYSTERECTOMY WITH SALPINGECTOMY;  Surgeon: Malachy Mood, MD;  Location: [facility] OR[facility];  Service: Gynecology;  Laterality: Bilateral;   TUBAL LIGATION  1996   WISDOM TOOTH EXTRACTION      Family Psychiatric History: Reviewed family psychiatric history from progress note on 02/18/2022.  Family History:  Family History  Problem Relation Age of Onset   Bipolar disorder Mother    Diabetes Mother    Pancreatic cancer Mother    Alcohol abuse Father    Lung cancer Father    Bipolar disorder Sister    Alcohol abuse Paternal Aunt    Cancer Paternal Aunt 0   Alcohol abuse Paternal Uncle    Alcohol abuse Paternal Grandfather    Bipolar disorder Niece    Anesthesia problems Neg Hx    Hypotension Neg Hx    Malignant hyperthermia Neg Hx    Pseudochol deficiency Neg Hx     Social History: Reviewed social history from progress note on 02/18/2022. Social History   Socioeconomic  History   Marital status: Divorced    Spouse name: Not on file   Number of children: 7   Years of education: Not on file   Highest education level: Some college, no degree  Occupational History    Comment: disability  Tobacco Use   Smoking status: Never   Smokeless tobacco: Never  Vaping Use   Vaping Use: Never used  Substance and Sexual Activity   Alcohol use: Not Currently    Comment: rare   Drug use: No   Sexual activity: Yes    Partners: Male    Birth control/protection: Surgical  Other Topics Concern   Not on file  Social History Narrative   In [place]; with 2 sons; 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 Never smoked; ocassional alcohol.    Social Determinants of Health   Financial Resource Strain: Not on file  Food Insecurity: Not on file  Transportation Needs: Not on file  Physical Activity: Not on file  Stress: Not on file  Social Connections: Not  on file    Allergies:  Allergies  Allergen Reactions   Lisinopril Anaphylaxis    Facial swelling, difficulty breathing   Morphine And Related Other (See Comments)    migraine    Metabolic Disorder Labs: Lab Results  Component Value Date   HGBA1C 5.6 03/18/2014   Lab Results  Component Value Date   PROLACTIN 24.2 (H) 03/03/2019   No results found for: "CHOL", "TRIG", "HDL", "CHOLHDL", "VLDL", "LDLCALC" Lab Results  Component Value Date   TSH 1.888 03/02/2019    Therapeutic Level Labs: No results found for: "LITHIUM" No results found for: "VALPROATE" No results found for: "CBMZ"  Current Medications: Current Outpatient Medications  Medication Sig Dispense Refill   AIMOVIG 140 MG/ML SOAJ Inject 140 mg into the skin every 30 (thirty) days.     albuterol (PROVENTIL HFA;VENTOLIN HFA) 108 (90 Base) MCG/ACT inhaler Inhale 2 puffs into the lungs every 6 (six) hours as needed for wheezing or shortness of breath. 1 Inhaler 2   azaTHIOprine (IMURAN) 50 MG tablet Take 50 mg by mouth every morning.     budesonide  (PULMICORT) 180 MCG/ACT inhaler Inhale 2 puffs into the lungs 2 (two) times daily.     celecoxib (CELEBREX) 200 MG capsule Take 1 capsule (200 mg total) by mouth 2 (two) times daily. 60 capsule 0   gabapentin (NEURONTIN) 600 MG tablet Take 1 tablet (600 mg total) by mouth 3 (three) times daily. 90 tablet 2   hydrOXYzine (ATARAX) 25 MG tablet Take 1 tablet (25 mg total) by mouth 2 (two) times daily as needed for anxiety. And sleep 60 tablet 1   Ipratropium-Albuterol (COMBIVENT) 20-100 MCG/ACT AERS respimat Inhale 1 puff into the lungs every morning.     lamoTRIgine (LAMICTAL) 150 MG tablet Take 0.5 tablets (75 mg total) by mouth 2 (two) times daily. Take along with 25 mg daily - total of 175 mg daily 90 tablet 0   lamoTRIgine (LAMICTAL) 25 MG tablet Take 1 tablet (25 mg total) by mouth daily. Take along with 150 mg daily 90 tablet 0   methocarbamol (ROBAXIN) 500 MG tablet Take 1 tablet (500 mg total) by mouth every 6 (six) hours. 120 tablet 0   Multiple Vitamin (MULTIVITAMIN) tablet Take 1 tablet by mouth daily.     ondansetron (ZOFRAN-ODT) 4 MG disintegrating tablet Take 4-8 mg by mouth every 8 (eight) hours as needed.     pantoprazole (PROTONIX) 40 MG tablet Take 1 tablet (40 mg total) by mouth every morning. 30 tablet 11   Rimegepant Sulfate 75 MG TBDP Take by mouth.     rizatriptan (MAXALT) 10 MG tablet Take by mouth.     SUMAtriptan (IMITREX) 100 MG tablet Take 100 mg by mouth every 2 (two) hours as needed for migraine.      traZODone (DESYREL) 100 MG tablet Take 1 tablet (100 mg total) by mouth at bedtime. 90 tablet 0   valACYclovir (VALTREX) 1000 MG tablet Take 1,000 mg by mouth 2 (two) times daily.     ARIPiprazole (ABILIFY) 2 MG tablet Take 1 tablet (2 mg total) by mouth in the morning. 90 tablet 0   No current facility-administered medications for this visit.     Musculoskeletal: Strength & Muscle Tone: within normal limits Gait & Station:  walks with cane Patient leans:  N/A  Psychiatric Specialty Exam: Review of Systems  Constitutional:  Positive for fatigue.  Musculoskeletal:  Positive for back pain.  Neurological:  Positive for headaches.  Psychiatric/Behavioral:  Positive  for decreased concentration, dysphoric mood and sleep disturbance. The patient is nervous/anxious.   All other systems reviewed and are negative.   Blood pressure 109/75, pulse (!) 59, temperature 99.2 F (37.3 C), temperature source Oral, height '5\' 7"'$  (1.702 m), weight 207 lb 3.2 oz (94 kg), last menstrual period 07/17/2019.Body mass index is 32.45 kg/m.  General Appearance: Casual  Eye Contact:  Fair  Speech:  Normal Rate  Volume:  Normal  Mood:  Anxious and Depressed  Affect:  Appropriate  Thought Process:  Goal Directed and Descriptions of Associations: Intact  Orientation:  Full (Time, Place, and Person)  Thought Content: WDL   Suicidal Thoughts:  No  Homicidal Thoughts:  No  Memory:  Immediate;   Fair Recent;   Fair Remote;   Fair  Judgement:  Fair  Insight:  Fair  Psychomotor Activity:  Normal  Concentration:  Concentration: Fair and Attention Span: Fair  Recall:  [employer] of Knowledge: Fair  Language: Fair  Akathisia:  No  Handed:  Right  AIMS (if indicated): done  Assets:  Communication Skills Desire for Improvement Housing Social Support  ADL's:  Intact  Cognition: WNL  Sleep:  Poor   Screenings: [place] Office Visit from 05/14/2022 in [facility] Total Score 0      AUDIT    Flowsheet Row Admission (Discharged) from 05/11/2013 in [facility]  Alcohol Use Disorder Identification Test Final Score (AUDIT) 0      GAD-7    Flowsheet Row Office Visit from 05/14/2022 in [place] Office Visit from 04/02/2022 in [facility] Office Visit from 02/18/2022 in [place]  Total GAD-7  Score '20 19 21      '$ PHQ2-9    [place] Office Visit from 05/14/2022 in [facility] Office Visit from 04/02/2022 in [place] Office Visit from 02/18/2022 in [facility] from 08/10/2017 in [facility]  PHQ-2 Total Score '6 6 6 5  '$ PHQ-9 Total Score '21 19 18 22      '$ [place] Office Visit from 05/14/2022 in [place] Office Visit from 04/02/2022 in [place] Office Visit from 02/18/2022 in [place] [facility] No Risk No Risk No Risk        Assessment and Plan: Lisa [name] is a 51 year old African-American female on disability, history of bipolar disorder, GAD, trauma and stress related disorder rule out PTSD, presented for medication management.  Patient is currently tolerating the Abilify well although she continues to struggle with chronic pain, chronic headaches, sleep problems which likely contributing to her continued depressive symptoms, will benefit from the following plan.  Plan Bipolar disorder type I most recent episode depressed moderate-unstable Continue lamotrigine 175 mg p.o. daily Continue gabapentin for neuropathy which is also a mood stabilizer. Continue Abilify 2 mg p.o. daily.  GAD-unstable Patient was advised to establish care with a therapist-pending We will consider an SSRI in the future. Hydroxyzine 25 mg p.o. twice daily as needed  Trauma and stress related disorder rule out PTSD-unstable Referred for CBT Start trazodone 100 mg p.o. nightly Discussed sleep hygiene techniques. Patient will also need sufficient pain management.   Follow-up in clinic in 8 weeks or sooner if needed.   This note was generated in part or whole with voice recognition software. Voice  recognition is usually quite accurate but there are  transcription errors that can and very often do occur. I apologize for any typographical errors that were not detected and corrected.      Ursula Alert, MD 05/14/2022, 12:49 PM

## 2022-05-20 ENCOUNTER — Other Ambulatory Visit: Payer: Medicare Other

## 2022-05-27 ENCOUNTER — Ambulatory Visit: Payer: Medicare Other | Admitting: Psychiatry

## 2022-05-29 ENCOUNTER — Other Ambulatory Visit: Payer: Self-pay | Admitting: Psychiatry

## 2022-05-29 DIAGNOSIS — F3132 Bipolar disorder, current episode depressed, moderate: Secondary | ICD-10-CM

## 2022-05-29 DIAGNOSIS — F411 Generalized anxiety disorder: Secondary | ICD-10-CM

## 2022-06-11 ENCOUNTER — Ambulatory Visit
Admission: RE | Admit: 2022-06-11 | Discharge: 2022-06-11 | Disposition: A | Discharge status: Home / Self Care | Payer: Medicare Other | Source: Ambulatory Visit | Attending: Family Medicine | Admitting: Family Medicine

## 2022-06-11 DIAGNOSIS — N644 Mastodynia: Secondary | ICD-10-CM

## 2022-07-15 ENCOUNTER — Ambulatory Visit: Payer: Medicare Other | Admitting: Psychiatry

## 2022-07-16 ENCOUNTER — Ambulatory Visit: Payer: Medicare Other | Admitting: Psychiatry

## 2022-07-16 ENCOUNTER — Telehealth: Payer: Self-pay | Admitting: Psychiatry

## 2022-07-16 DIAGNOSIS — F411 Generalized anxiety disorder: Secondary | ICD-10-CM

## 2022-07-16 DIAGNOSIS — F3132 Bipolar disorder, current episode depressed, moderate: Secondary | ICD-10-CM

## 2022-07-16 MED ORDER — ARIPIPRAZOLE 2 MG PO TABS: 2.0000 mg | ORAL_TABLET | Freq: Every morning | ORAL | 0 refills | Status: DC

## 2022-07-16 MED ORDER — TRAZODONE HCL 100 MG PO TABS: 100.0000 mg | ORAL_TABLET | Freq: Every day | ORAL | 0 refills | Status: DC

## 2022-07-16 MED ORDER — LAMOTRIGINE 25 MG PO TABS: 25.0000 mg | ORAL_TABLET | Freq: Every day | ORAL | 0 refills | Status: DC

## 2022-07-16 MED ORDER — LAMOTRIGINE 150 MG PO TABS: 75.0000 mg | ORAL_TABLET | Freq: Two times a day (BID) | ORAL | 0 refills | Status: DC

## 2022-07-16 NOTE — Telephone Encounter (Signed)
I have sent Abilify, Lamictal and trazodone to [facility].

## 2022-07-16 NOTE — Telephone Encounter (Signed)
Patient had appt on 07-16-22 but did not show. Lvm at 8:10 am morning of stating that she had RSV and needed to reschedule. Coming in 08-26-22. Also stated that she is out of the Lamictal 150 mg. She will be out of the abilify by next appointment in Feb. Please send to [facility] on [address].

## 2022-07-28 ENCOUNTER — Ambulatory Visit (INDEPENDENT_AMBULATORY_CARE_PROVIDER_SITE_OTHER): Payer: Medicare Other | Admitting: Neurosurgery

## 2022-07-28 DIAGNOSIS — M5416 Radiculopathy, lumbar region: Secondary | ICD-10-CM

## 2022-07-28 MED ORDER — GABAPENTIN 300 MG PO CAPS: 300.0000 mg | ORAL_CAPSULE | Freq: Two times a day (BID) | ORAL | 0 refills | Status: DC

## 2022-07-28 NOTE — Progress Notes (Signed)
   DOS: 01/19/22 (L4-S1 decompression) 01/30/22 (evacuation of hematoma)  HISTORY OF PRESENT ILLNESS: 07/28/22 [name] is a 52 y.o presenting today for follow-up.  She states that she has had recurrence of right posterior radiating leg pain over the last month with persistent numbness.  She is currently taking Tylenol with minimal relief.   03/03/22 Ms. Tacara Hadlock is status post the above surgeries.  She has no in her right leg, but her pain is much improved compared to preop.   PHYSICAL EXAMINATION:   There were no vitals filed for this visit.  General: Patient is well developed, well nourished, calm, collected, and in no apparent distress.  NEUROLOGICAL:  General: In no acute distress.  Awake, alert, oriented to person, place, and time. Pupils equal round and reactive to light.   Strength:  Side Iliopsoas Quads Hamstring PF DF EHL  R '5 5 5 5 5 5  '$ L '5 5 5 5 5 4   '$ Incision c/d/i  She has diminished sensation on the right side in the S1 distribution  ROS (Neurologic): Negative except as noted above  IMAGING: No interval imaging to review   ASSESSMENT/PLAN:  Malaka Ruffner is doing well after lumbar decompression that required a takeback for evacuation of the hematoma.  While she states she has never completely recovered from surgery, she has had worsening right radiating leg pain over the last month.  We will update an MRI for further evaluation.  Should this be largely normal we will obtain an EMG nerve conduction study for further evaluation. we did briefly discussed a spinal cord stimulator should her MRI be normal and we will discuss this further should we need to.  In the interim I have started her on 300 mg of gabapentin twice daily.  I will contact her with the results of her MRI via telephone visit once completed.  She was encouraged to call the office in the interim should she have any questions or concerns.  She expressed understanding was in  agreement with this plan.  I spent a total of 33 minutes in both face-to-face and non-face-to-face activities for this visit on the date of this encounter records, review of symptoms, physical exam, discussion of plan of care, and documentation, and order placement.   Cooper Render PA-C Department of Neurosurgery

## 2022-08-04 ENCOUNTER — Encounter: Payer: Self-pay | Admitting: Internal Medicine

## 2022-08-07 ENCOUNTER — Other Ambulatory Visit: Payer: Self-pay | Admitting: Student

## 2022-08-07 DIAGNOSIS — G43109 Migraine with aura, not intractable, without status migrainosus: Secondary | ICD-10-CM

## 2022-08-11 ENCOUNTER — Ambulatory Visit
Admission: RE | Admit: 2022-08-11 | Discharge: 2022-08-11 | Disposition: A | Discharge status: Home / Self Care | Payer: Medicare Other | Source: Ambulatory Visit | Attending: Neurosurgery | Admitting: Neurosurgery

## 2022-08-11 DIAGNOSIS — M5416 Radiculopathy, lumbar region: Secondary | ICD-10-CM | POA: Insufficient documentation

## 2022-08-19 ENCOUNTER — Telehealth: Payer: Self-pay | Admitting: Neurosurgery

## 2022-08-19 ENCOUNTER — Ambulatory Visit (INDEPENDENT_AMBULATORY_CARE_PROVIDER_SITE_OTHER): Payer: Medicare Other | Admitting: Neurosurgery

## 2022-08-19 DIAGNOSIS — Z9889 Other specified postprocedural states: Secondary | ICD-10-CM

## 2022-08-19 DIAGNOSIS — M5416 Radiculopathy, lumbar region: Secondary | ICD-10-CM

## 2022-08-19 NOTE — Telephone Encounter (Signed)
Referral faxed to [facility]. 

## 2022-08-19 NOTE — Progress Notes (Signed)
Neurosurgery Telephone (Audio-Only) Note  Requesting Provider     [place], [facility] [place],  [place] [address] T: [phone] F: [phone]  Primary Care Provider [place], [facility] [place] [address] T: [phone] F: [phone]  Telehealth visit was conducted with Lisa [name], a 52 y.o. female via telephone.  History of Present Illness: Lisa [name] is a 52 y.o presenting today via telephone visit to review her MRI results.  She continues to have right radiating leg pain.  She has been taking gabapentin 300 mg twice daily without significant relief  07/28/22 Lisa [name] is a 52 y.o presenting today for follow-up.  She states that she has had recurrence of right posterior radiating leg pain over the last month with persistent numbness.  She is currently taking Tylenol with minimal relief.     03/03/22 Lisa [name] is status post the above surgeries.  She has no in her right leg, but her pain is much improved compared to preop.   General Review of Systems:  A ROS was performed including pertinent positive and negatives as documented.  All other systems are negative.  Prior to Admission medications   Medication Sig Start Date End Date Taking? Authorizing Provider  AIMOVIG 140 MG/ML SOAJ Inject 140 mg into the skin every 30 (thirty) days. 02/19/19   [provider]  albuterol (PROVENTIL HFA;VENTOLIN HFA) 108 (90 Base) MCG/ACT inhaler Inhale 2 puffs into the lungs every 6 (six) hours as needed for wheezing or shortness of breath. 02/28/17   Rudene Re, MD  ARIPiprazole (ABILIFY) 2 MG tablet Take 1 tablet (2 mg total) by mouth in the morning. 07/16/22   Ursula Alert, MD  azaTHIOprine (IMURAN) 50 MG tablet Take 50 mg by mouth every morning. 02/02/19   [provider]  gabapentin (NEURONTIN) 300 MG capsule Take 1 capsule (300 mg total) by mouth 2 (two) times daily. 07/28/22    Loleta Dicker, PA  Ipratropium-Albuterol (COMBIVENT) 20-100 MCG/ACT AERS respimat Inhale 1 puff into the lungs every morning.    [provider]  lamoTRIgine (LAMICTAL) 150 MG tablet Take 0.5 tablets (75 mg total) by mouth 2 (two) times daily. Take along with 25 mg daily - total of 175 mg daily 07/16/22   Ursula Alert, MD  lamoTRIgine (LAMICTAL) 25 MG tablet Take 1 tablet (25 mg total) by mouth daily. Take along with 150 mg daily 07/16/22   Ursula Alert, MD  methocarbamol (ROBAXIN) 500 MG tablet Take 1 tablet (500 mg total) by mouth every 6 (six) hours. 01/24/22   Abd-El-Barr, Rogue Jury, MD  Multiple Vitamin (MULTIVITAMIN) tablet Take 1 tablet by mouth daily.    [provider]  pantoprazole (PROTONIX) 40 MG tablet Take 1 tablet (40 mg total) by mouth every morning. 01/24/22 01/24/23  Abd-El-Barr, Rogue Jury, MD  Rimegepant Sulfate 75 MG TBDP Take by mouth. 03/31/22   [provider]  rizatriptan (MAXALT) 10 MG tablet Take by mouth. 03/31/22 03/31/23  [provider]  SUMAtriptan (IMITREX) 100 MG tablet Take 100 mg by mouth every 2 (two) hours as needed for migraine.  02/10/19   [provider]  traZODone (DESYREL) 100 MG tablet Take 1 tablet (100 mg total) by mouth at bedtime. 07/16/22   Ursula Alert, MD  valACYclovir (VALTREX) 1000 MG tablet Take 1,000 mg by mouth 2 (two) times daily.    [provider]    DATA REVIEWED    Imaging Studies  MRI 08/11/22  Narrative & Impression  CLINICAL DATA:  Provided history: Lumbar radiculopathy. Lumbar radiculopathy, prior surgery, new symptoms. Additional history provided by the scanning technologist: The patient reports suffering a fall 1 month ago, pain radiating into right leg with numbness.   EXAM: MRI LUMBAR SPINE WITHOUT CONTRAST   TECHNIQUE: Multiplanar, multisequence MR imaging of the lumbar spine was performed. No intravenous contrast was administered.   COMPARISON:  Lumbar spine MRI  01/30/2022. Lumbar spine radiographs 01/30/2022.   FINDINGS: Segmentation: Five lumbar vertebrae. The caudal-most well-formed intervertebral disc space is designated L5-S1.   Alignment:  Slight L4-L5 grade 1 anterolisthesis.   Vertebrae: Vertebral body height is maintained. Edema within the posterior elements bilaterally at L4-L5, likely degenerative and related to facet arthrosis. Elsewhere, no significant marrow edema or focal suspicious osseous lesion is identified.   Conus medullaris and cauda equina: Conus extends to the L1-L2 level. No signal abnormality identified within the visualized distal spinal cord.   Paraspinal and other soft tissues: No acute findings within included portions of the abdomen/retroperitoneum. No paraspinal mass or collection.   Disc levels:   Unless otherwise stated, the level by level findings below have not significantly changed from the prior MRI of 01/30/2022.   Mild multilevel disc degeneration, greatest at L5-S1.   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   T12-L1: Mild facet arthrosis and ligamentum flavum hypertrophy. No significant disc herniation or stenosis.   L1-L2: Small left foraminal disc protrusion. Moderate facet arthrosis with ligamentum flavum hypertrophy, progressed. Mild relative left subarticular narrowing, new from the prior MRI, and without nerve root impingement at this site. No significant central canal or foraminal stenosis.   L2-L3: Disc bulge. Moderate facet arthrosis with ligamentum flavum hypertrophy. Mild left subarticular narrowing (without nerve root impingement). Mild relative narrowing of the central canal. Mild bilateral neural foraminal narrowing.   L3-L4: Disc bulge. Moderate facet arthrosis with ligamentum flavum hypertrophy. Mild bilateral subarticular narrowing (without nerve root impingement). Mild narrowing of the central canal. Mild right neural foraminal narrowing.    L4-L5: Slight grade 1 anterolisthesis. Post laminectomy changes on the right. Slight disc uncovering with disc bulge. Advanced facet arthrosis, progressed. Ligamentum flavum hypertrophy. A previously demonstrated fluid collection at the laminectomy site is no longer present. Central canal stenosis has improved, now moderate to moderately severe (previously severe). Persistent bilateral subarticular stenosis with potential to affect either descending L5 nerve root. Mild-to-moderate bilateral neural foraminal narrowing, unchanged.   L5-S1: Post laminectomy changes on the right. Disc bulge. Superimposed broad-based central/left subarticular disc protrusion (at site of posterior annular fissure). Previously demonstrated fluid collections within the subdural space and at the laminectomy site are no longer present. Moderate facet arthrosis. Although improved from the prior examination, the disc protrusion contributes to persistent moderate left subarticular stenosis with potential to affect the descending left S1 nerve root. Mild central canal stenosis, improved (previously severe). Mild-to-moderate left neural foraminal narrowing, unchanged.   IMPRESSION: 1. Comparison is made to the prior lumbar spine MRI of 01/30/2022. 2. Lumbar spondylosis and postoperative changes, as outlined and with findings most notably as follows. 3. At L5-S1, post laminectomy changes are again noted on the right. Previously demonstrated fluid collections within the subdural space and at the laminectomy site are no longer present. Although improved from the prior exam, a disc protrusion contributes to moderate left subarticular stenosis with potential to affect the descending left S1 nerve root. Mild central canal stenosis has also improved (previously severe). Mild-to-moderate left neural foraminal narrowing, unchanged.  4. At L4-L5, post laminectomy changes are again noted on the right. A previously  demonstrated fluid collection at the laminectomy site is no longer present. Moderate to moderately severe central canal stenosis has improved (previously severe). Persistent bilateral subarticular stenosis with potential to affect either descending L5 nerve root. Mild-to-moderate bilateral neural foraminal narrowing, unchanged. Advanced facet arthrosis at this level with posterior element marrow edema, small right facet joint effusion and slight grade 1 anterolisthesis. 5. No more than mild spinal canal or neural foraminal narrowing at the remaining     Electronically Signed   By: Kellie Simmering D.O.   On: 08/12/2022 08:49    IMPRESSION  Lisa [name] is a 52 y.o. female who I performed a telephone encounter today for evaluation and management of: Lumbar radiculopathy  PLAN  Lisa [name] is a 52 year old with a history of previous L4-S1 decompression presenting with continued right rating leg pain.  Her MRI does show some ongoing neuroforaminal stenosis particularly at L4-5 bilaterally.  This may correlate with her symptoms however it is moderate at best.  I would like for her to undergo an EMG for evaluation of chronic radiculitis as well as an ESI.  I referred her to Dr. Manuella Ghazi for EMG and Dr. Alba Destine for injection.  I will have her follow-up with Dr. Cari Caraway after completion of both of these to discuss further options.  I instructed her to increase her gabapentin to 300 mg 3 times daily.  She was encouraged to call the office in the interim with any questions or concerns.   DISPOSITION  Follow up: In person appointment in  After EMG and ESI  Loleta Dicker, PA   TELEPHONE DOCUMENTATION   This visit was performed via telephone.  Patient location: home Provider location: office  I spent a total of 10 minutes non-face-to-face activities for this visit on the date of this encounter including review of current clinical condition and response to treatment.  The  patient is aware of and accepts the limits of this telehealth visit.

## 2022-08-19 NOTE — Telephone Encounter (Signed)
EMG order for lower extremities. Please send to Dr. Manuella Ghazi

## 2022-08-20 ENCOUNTER — Ambulatory Visit
Admission: RE | Admit: 2022-08-20 | Discharge: 2022-08-20 | Disposition: A | Discharge status: Home / Self Care | Payer: Medicare Other | Source: Ambulatory Visit | Attending: Student | Admitting: Student

## 2022-08-20 DIAGNOSIS — G43109 Migraine with aura, not intractable, without status migrainosus: Secondary | ICD-10-CM

## 2022-08-20 MED ORDER — GADOPICLENOL 0.5 MMOL/ML IV SOLN
9.0000 mL | Freq: Once | INTRAVENOUS | Status: AC | PRN
  Administered 2022-08-20: 9 mL via INTRAVENOUS

## 2022-08-26 ENCOUNTER — Encounter: Payer: Self-pay | Admitting: Psychiatry

## 2022-08-26 ENCOUNTER — Ambulatory Visit (INDEPENDENT_AMBULATORY_CARE_PROVIDER_SITE_OTHER): Payer: Medicare Other | Admitting: Psychiatry

## 2022-08-26 VITALS — BP 116/78 | HR 83 | Temp 98.5°F | Ht 67.0 in | Wt 223.4 lb

## 2022-08-26 DIAGNOSIS — F3132 Bipolar disorder, current episode depressed, moderate: Secondary | ICD-10-CM | POA: Diagnosis not present

## 2022-08-26 DIAGNOSIS — F411 Generalized anxiety disorder: Secondary | ICD-10-CM

## 2022-08-26 DIAGNOSIS — F4310 Post-traumatic stress disorder, unspecified: Secondary | ICD-10-CM

## 2022-08-26 MED ORDER — ARIPIPRAZOLE 2 MG PO TABS: 1.0000 mg | ORAL_TABLET | Freq: Every morning | ORAL | 0 refills | Status: DC

## 2022-08-26 NOTE — Patient Instructions (Signed)
[email or web address] [address] [place], [place] [address]  [phone]

## 2022-08-26 NOTE — Telephone Encounter (Signed)
Per Dr.Shah's office they will contact the patient to schedule EMG but they are booked out to the end of March early April. She has an appt with them on 2/29 with PA for a follow-up, not the same issue.
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
## 2022-08-31 ENCOUNTER — Other Ambulatory Visit: Payer: Self-pay | Admitting: Orthopedic Surgery

## 2022-08-31 DIAGNOSIS — Z9889 Other specified postprocedural states: Secondary | ICD-10-CM

## 2022-08-31 DIAGNOSIS — M5416 Radiculopathy, lumbar region: Secondary | ICD-10-CM

## 2022-08-31 MED ORDER — GABAPENTIN 300 MG PO CAPS: 300.0000 mg | ORAL_CAPSULE | Freq: Three times a day (TID) | ORAL | 0 refills | Status: DC

## 2022-08-31 NOTE — Telephone Encounter (Signed)
Per Danielle's last note, neurontin was increased to tid at her last visit.   Refill sent to her pharmacy with change in directions noted.  Please let her know.

## 2022-08-31 NOTE — Telephone Encounter (Signed)
From: Otis Dials [place] To: Office of Lisa Cutter [place], [place] Sent: 08/28/2022 9:55 PM EST Subject: Medication Renewal Request  Refills have been requested for the following medications:   gabapentin (NEURONTIN) 300 MG capsule Lisa [name]]  Patient Comment: 3 times daily as discussed is needed  Preferred pharmacy: [facility] [place] - [address] Delivery method: [employer]

## 2022-08-31 NOTE — Telephone Encounter (Signed)
Patient notified of refill.

## 2022-09-16 NOTE — Telephone Encounter (Signed)
Per Neurology-she has not scheduled her EMG yet but she has an appt with Dr.Shah on 12/10/2022. She recently saw the PA.

## 2022-09-21 ENCOUNTER — Encounter: Payer: Self-pay | Admitting: Internal Medicine

## 2022-09-30 NOTE — Addendum Note (Signed)
Encounter addended by: Kae Heller on: 09/30/2022 8:47 AM  Actions taken: Imaging Exam ended

## 2022-10-05 ENCOUNTER — Telehealth: Payer: Self-pay | Admitting: Psychiatry

## 2022-10-05 NOTE — Telephone Encounter (Signed)
Returned call to patient to discuss her concern about weight loss medication which was sent to [place] through my chart.  Discussed with patient that if she starts topiramate we will try to taper her off of the Lamictal.  Topiramate is also a mood stabilizer.  Also discussed adverse side effects of Wellbutrin naltrexone combination as well as phentermine given her history of generalized anxiety disorder, insomnia and bipolar disorder.  Provided medication education.

## 2022-10-07 ENCOUNTER — Other Ambulatory Visit: Payer: Self-pay | Admitting: Physical Medicine & Rehabilitation

## 2022-10-07 ENCOUNTER — Encounter: Payer: Self-pay | Admitting: Internal Medicine

## 2022-10-07 DIAGNOSIS — M546 Pain in thoracic spine: Secondary | ICD-10-CM

## 2022-10-14 ENCOUNTER — Ambulatory Visit
Admission: RE | Admit: 2022-10-14 | Discharge: 2022-10-14 | Disposition: A | Discharge status: Home / Self Care | Payer: Medicare PPO | Source: Ambulatory Visit | Attending: Physical Medicine & Rehabilitation | Admitting: Physical Medicine & Rehabilitation

## 2022-10-14 ENCOUNTER — Encounter: Payer: Self-pay | Admitting: Internal Medicine

## 2022-10-14 DIAGNOSIS — M546 Pain in thoracic spine: Secondary | ICD-10-CM

## 2022-10-19 ENCOUNTER — Other Ambulatory Visit: Payer: Medicare Other

## 2022-10-21 ENCOUNTER — Ambulatory Visit (INDEPENDENT_AMBULATORY_CARE_PROVIDER_SITE_OTHER): Payer: Medicare PPO | Admitting: Psychiatry

## 2022-10-21 ENCOUNTER — Encounter: Payer: Self-pay | Admitting: Psychiatry

## 2022-10-21 VITALS — BP 103/67 | HR 68 | Temp 98.3°F | Ht 67.0 in | Wt 221.2 lb

## 2022-10-21 DIAGNOSIS — G4701 Insomnia due to medical condition: Secondary | ICD-10-CM | POA: Insufficient documentation

## 2022-10-21 DIAGNOSIS — F3132 Bipolar disorder, current episode depressed, moderate: Secondary | ICD-10-CM | POA: Diagnosis not present

## 2022-10-21 DIAGNOSIS — F411 Generalized anxiety disorder: Secondary | ICD-10-CM

## 2022-10-21 DIAGNOSIS — F4310 Post-traumatic stress disorder, unspecified: Secondary | ICD-10-CM

## 2022-10-21 MED ORDER — TOPIRAMATE 25 MG PO TABS: 25.0000 mg | ORAL_TABLET | ORAL | 0 refills | Status: DC

## 2022-10-21 MED ORDER — LAMOTRIGINE 25 MG PO TABS: 25.0000 mg | ORAL_TABLET | Freq: Every day | ORAL | 0 refills | Status: DC

## 2022-10-21 NOTE — Patient Instructions (Signed)
Topiramate Tablets What is this medication? TOPIRAMATE (toe PYRE a mate) prevents and controls seizures in people with epilepsy. It may also be used to prevent migraine headaches. It works by calming overactive nerves in your body. This medicine may be used for other purposes; ask your health care provider or pharmacist if you have questions. COMMON BRAND NAME(S): Topamax, Topiragen What should I tell my care team before I take this medication? They need to know if you have any of these conditions: Bleeding disorder Kidney disease Lung disease Suicidal thoughts, plans, or attempt by you or a family member An unusual or allergic reaction to topiramate, other medications, foods, dyes, or preservatives Pregnant or trying to get pregnant Breast-feeding How should I use this medication? Take this medication by mouth with water. Take it as directed on the prescription label at the same time every day. Do not cut, crush or chew this medicine. Swallow the tablets whole. You can take it with or without food. If it upsets your stomach, take it with food. Keep taking it unless your care team tells you to stop. A special MedGuide will be given to you by the pharmacist with each prescription and refill. Be sure to read this information carefully each time. Talk to your care team about the use of this medication in children. While it may be prescribed for children as young as 2 years for selected conditions, precautions do apply. Overdosage: If you think you have taken too much of this medicine contact a poison control center or emergency room at once. NOTE: This medicine is only for you. Do not share this medicine with others. What if I miss a dose? If you miss a dose, take it as soon as you can unless it is within 6 hours of the next dose. If it is within 6 hours of the next dose, skip the missed dose. Take the next dose at the normal time. Do not take double or extra doses. What may interact with this  medication? Acetazolamide Alcohol Antihistamines for allergy, cough, and cold Aspirin and aspirin-like medications Atropine Certain medications for anxiety or sleep Certain medications for bladder problems, such as oxybutynin, tolterodine Certain medications for depression, such as amitriptyline, fluoxetine, sertraline Certain medications for Parkinson disease, such as benztropine, trihexyphenidyl Certain medications for seizures, such as carbamazepine, lamotrigine, phenobarbital, phenytoin, primidone, valproic acid, zonisamide Certain medications for stomach problems, such as dicyclomine, hyoscyamine Certain medications for travel sickness, such as scopolamine Certain medications that treat or prevent blood clots, such as warfarin, enoxaparin, dalteparin, apixaban, dabigatran, rivaroxaban Digoxin Diltiazem Estrogen and progestin hormones General anesthetics, such as halothane, isoflurane, methoxyflurane, propofol Glyburide Hydrochlorothiazide Ipratropium Lithium Medications that relax muscles Metformin NSAIDs, medications for pain and inflammation, such as ibuprofen or naproxen Opioid medications for pain Phenothiazines, such as chlorpromazine, mesoridazine, prochlorperazine, thioridazine Pioglitazone This list may not describe all possible interactions. Give your health care provider a list of all the medicines, herbs, non-prescription drugs, or dietary supplements you use. Also tell them if you smoke, drink alcohol, or use illegal drugs. Some items may interact with your medicine. What should I watch for while using this medication? Visit your care team for regular checks on your progress. Tell your care team if your symptoms do not start to get better or if they get worse. Do not suddenly stop taking this medication. You may develop a severe reaction. Your care team will tell you how much medication to take. If your care team wants you to stop the medication,   the dose may be slowly  lowered over time to avoid any side effects. Wear a medical ID bracelet or chain. Carry a card that describes your condition. List the medications and doses you take on the card. This medication may affect your coordination, reaction time, or judgment. Do not drive or operate machinery until you know how this medication affects you. Sit up or stand slowly to reduce the risk of dizzy or fainting spells. Drinking alcohol with this medication can increase the risk of these side effects. This medication may cause serious skin reactions. They can happen weeks to months after starting the medication. Contact your care team right away if you notice fevers or flu-like symptoms with a rash. The rash may be red or purple and then turn into blisters or peeling of the skin. You may also notice a red rash with swelling of the face, lips, or lymph nodes in your neck or under your arms. This medication may cause thoughts of suicide or depression. This includes sudden changes in mood, behaviors, or thoughts. These changes can happen at any time but are more common in the beginning of treatment or after a change in dose. Call your care team right away if you experience these thoughts or worsening depression. This medication may slow your child's growth if it is taken for a long time at high doses. Your child's care team will monitor your child's growth. Using this medication for a long time may weaken your bones. The risk of bone fractures may be increased. Talk to your care team about your bone health. Discuss this medication with your care team if you may be pregnant. Serious birth defects can occur if you take this medication during pregnancy. There are benefits and risks to taking medications during pregnancy. Your care team can help you find the option that works for you. Contraception is recommended while taking this medication. Estrogen and progestin hormones may not work as well while you are taking this medication.  Your care team can help you find the option that works for you. Talk to your care team before breastfeeding. Changes to your treatment plan may be needed. What side effects may I notice from receiving this medication? Side effects that you should report to your care team as soon as possible: Allergic reactions--skin rash, itching, hives, swelling of the face, lips, tongue, or throat High acid level--trouble breathing, unusual weakness or fatigue, confusion, headache, fast or irregular heartbeat, nausea, vomiting High ammonia level--unusual weakness or fatigue, confusion, loss of appetite, nausea, vomiting, seizures Fever that does not go away, decrease in sweat Kidney stones--blood in the urine, pain or trouble passing urine, pain in the lower back or sides Redness, blistering, peeling or loosening of the skin, including inside the mouth Sudden eye pain or change in vision such as blurry vision, seeing halos around lights, vision loss Thoughts of suicide or self-harm, worsening mood, feelings of depression Side effects that usually do not require medical attention (report to your care team if they continue or are bothersome): Burning or tingling sensation in hands or feet Difficulty with paying attention, memory, or speech Dizziness Drowsiness Fatigue Loss of appetite with weight loss Slow or sluggish movements of the body This list may not describe all possible side effects. Call your doctor for medical advice about side effects. You may report side effects to FDA at 1-800-FDA-1088. Where should I keep my medication? Keep out of the reach of children and pets. Store between 15 and 30 degrees C (  59 and 86 degrees F). Protect from moisture. Keep the container tightly closed. Get rid of any unused medication after the expiration date. To get rid of medications that are no longer needed or have expired: Take the medication to a medication take-back program. Check with your pharmacy or law  enforcement to find a location. If you cannot return the medication, check the label or package insert to see if the medication should be thrown out in the garbage or flushed down the toilet. If you are not sure, ask your care team. If it is safe to put it in the trash, empty the medication out of the container. Mix the medication with cat litter, dirt, coffee grounds, or other unwanted substance. Seal the mixture in a bag or container. Put it in the trash. NOTE: This sheet is a summary. It may not cover all possible information. If you have questions about this medicine, talk to your doctor, pharmacist, or health care provider.  2023 [employer]vier/Gold Standard (2021-11-18 00:00:00)  

## 2022-10-21 NOTE — Progress Notes (Unsigned)
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  Patient today reports she is currently tired, likely because of sleep issues.  Gets only around 3 hours of sleep at night.  Patient reports pain does have an impact on her sleep.  She reports she is getting ready to get her fourth round of injection for her back pain.  Looks forward to that.  Patient also reports she recently had a sleep study and is ready to pick up her CPAP.  That likely will also help with her sleep.  She does have trazodone available and when she takes it she sleeps better.  She however does not use it often.  She does have teenagers who needs her support in the morning.  She hence does not want to sleep excessively after taking a sleep medication, that worries her.  Patient reports overall mood symptoms as improving, does not have any significant mood swings at this time.  Tolerated being tapered off of the Abilify well.  Patient reports she is interested in weight loss and her provider recommended Topamax.  Wonders whether the Lamictal has interaction with the Topamax.  She is interested in changing her mood stabilizer to Topamax to help with her mood as well as weight loss.  Patient is on warfarin, receptive to counseling regarding interaction between warfarin and Topamax as well as interaction  between Lamictal and Topamax.  Patient denies any suicidality, homicidality or perceptual disturbances.  Patient denies any other concerns today.  Visit Diagnosis:    ICD-10-CM   1. Bipolar 1 disorder, depressed, moderate  F31.32 lamoTRIgine (LAMICTAL) 25 MG tablet    topiramate (TOPAMAX) 25 MG tablet    2. GAD (generalized anxiety disorder)  F41.1 topiramate (TOPAMAX) 25 MG tablet    3. PTSD (post-traumatic stress disorder)  F43.10 topiramate (TOPAMAX) 25 MG tablet    4. Insomnia due to medical condition  G47.01    Pain, sleep apnea      Past Psychiatric History: I have reviewed past psychiatric history from progress note on 02/19/2019.  Past trials of medications like risperidone, Invega, Celexa, lithium, Topamax, hydroxyzine, trazodone.  Past Medical History:  Past Medical History:  Diagnosis Date   Abdominal pain    d/t ongoing menstrual bleeding   Anemia    iron deficiency, receiving Feraheme transfusions   Arthritis    Asthma    nonspecific interstitial pneumonia.Marland Kitchendiagnosed 6 years ago   Bipolar 1 disorder    Blood in stool    happens often but unsure of etiology.  will have colonoscopy   Change in voice    Chest pain    has often but is due to lung disease   Constipation    Depression    Diverticulitis    DVT (deep venous thrombosis) 12/13/2014   Dysrhythmia    Fatigue    Gallstones 04/10/2011   with biliary pancreatitis   GERD (gastroesophageal reflux disease)    History of blood transfusion  02/2019   one unit d/t hgb being low   History of hiatal hernia    small   History of kidney stones    History of methicillin resistant staphylococcus aureus (MRSA)    Hypertension    Idiopathic non-specific interstitial pneumonitis    Migraines    MRSA carrier    many years ago.   Nausea    Pancreatitis    Pre-diabetes    PTSD (post-traumatic stress disorder)    Pulmonary embolism 12/13/2014   Sleep apnea    does not use cpap now.  will be retested soon    Takes iron supplements    FERAHEME weekly transfusions as well as ferrous sulfate PO   Trouble swallowing    Vocal cord polyps     Past Surgical History:  Procedure Laterality Date   BACK SURGERY     BRAVO PH STUDY  06/02/2011   Procedure: BRAVO PH STUDY;  Surgeon: Louis Meckel, MD;  Location: [facility];  Service: Endoscopy;  Laterality: N/A;   COLONOSCOPY     CYSTOSCOPY  08/15/2019   Procedure: CYSTOSCOPY;  Surgeon: Vena Austria, MD;  Location: [facility];  Service: Gynecology;;   DILATION AND CURETTAGE OF UTERUS     ENDOMETRIAL ABLATION N/A 03/30/2019   Procedure: ENDOMETRIAL ABLATION;  Surgeon: Vena Austria, MD;  Location: [facility];  Service: Gynecology;  Laterality: N/A;   ESOPHAGOGASTRODUODENOSCOPY  06/02/2011   Procedure: ESOPHAGOGASTRODUODENOSCOPY (EGD);  Surgeon: Louis Meckel, MD;  Location[facility];  Service: Endoscopy;  Laterality: N/A;   GASTRIC BYPASS  2021   HYSTEROSCOPY  03/30/2019   Procedure: HYSTEROSCOPY;  Surgeon: Vena Austria, MD;  Location: [facility];  Service: Gynecology;;   LAPAROSCOPIC CHOLECYSTECTOMY W/ CHOLANGIOGRAPHY  04/25/2011   Dr Jamey Ripa   LUMBAR LAMINECTOMY FOR EPIDURAL ABSCESS N/A 01/30/2022   Procedure: LUMBAR LAMINECTOMY FOR EPIDURAL ABSCESS;  Surgeon: Venetia Night, MD;  Location: [facility];  Service: Neurosurgery;  Laterality: N/A;  Per consent and verbal confirmation with the surgeon, the procedure performed was wound exploration, evacuation of epidural fluid collection   LUMBAR LAMINECTOMY/DECOMPRESSION MICRODISCECTOMY N/A 01/19/2022   Procedure: L4-S1 DECOMPRESSION;  Surgeon: Venetia Night, MD;  Location: [facility];  Service: Neurosurgery;  Laterality: N/A;   NASAL POLYP SURGERY     polyp from vocal cords   TOTAL LAPAROSCOPIC HYSTERECTOMY WITH SALPINGECTOMY Bilateral 08/15/2019   Procedure: TOTAL LAPAROSCOPIC HYSTERECTOMY WITH SALPINGECTOMY;  Surgeon: Vena Austria, MD;  Location: [facility];  Service: Gynecology;   Laterality: Bilateral;   TUBAL LIGATION  1996   WISDOM TOOTH EXTRACTION      Family Psychiatric History: Reviewed family psychiatric history from progress note on 02/19/2019.  Family History:  Family History  Problem Relation Age of Onset   Bipolar disorder Mother    Diabetes Mother    Pancreatic cancer Mother    Alcohol abuse Father    Lung cancer Father    Bipolar disorder Sister    Alcohol abuse Paternal Aunt    Cancer Paternal Aunt 0   Alcohol abuse Paternal Uncle    Alcohol abuse Paternal Grandfather    Bipolar disorder Niece    Anesthesia problems Neg Hx    Hypotension Neg Hx    Malignant hyperthermia Neg Hx    Pseudochol deficiency Neg Hx     Social History: Reviewed social history from progress note on 02/19/2019. Social History   Socioeconomic History   Marital status: Divorced    Spouse name: Not on file   Number of children: 7  Years of education: Not on file   Highest education level: Some college, no degree  Occupational History    Comment: disability  Tobacco Use   Smoking status: Never   Smokeless tobacco: Never  Vaping Use   Vaping Use: Never used  Substance and Sexual Activity   Alcohol use: Not Currently    Comment: rare   Drug use: No   Sexual activity: Yes    Partners: Male    Birth control/protection: Surgical  Other Topics Concern   Not on file  Social History Narrative   In [place]; with 2 sons; used to work in Physicist, medical. Never smoked; ocassional alcohol.    Social Determinants of Health   Financial Resource Strain: Not on file  Food Insecurity: Not on file  Transportation Needs: Not on file  Physical Activity: Not on file  Stress: Not on file  Social Connections: Not on file    Allergies:  Allergies  Allergen Reactions   Lisinopril Anaphylaxis    Facial swelling, difficulty breathing   Morphine And Related Other (See Comments)    migraine    Metabolic Disorder Labs: Lab Results  Component Value Date   HGBA1C 5.6  03/18/2014   Lab Results  Component Value Date   PROLACTIN 24.2 (H) 03/03/2019   No results found for: "CHOL", "TRIG", "HDL", "CHOLHDL", "VLDL", "LDLCALC" Lab Results  Component Value Date   TSH 1.888 03/02/2019    Therapeutic Level Labs: No results found for: "LITHIUM" No results found for: "VALPROATE" No results found for: "CBMZ"  Current Medications: Current Outpatient Medications  Medication Sig Dispense Refill   AIMOVIG 140 MG/ML SOAJ Inject 140 mg into the skin every 30 (thirty) days.     albuterol (PROVENTIL HFA;VENTOLIN HFA) 108 (90 Base) MCG/ACT inhaler Inhale 2 puffs into the lungs every 6 (six) hours as needed for wheezing or shortness of breath. 1 Inhaler 2   azaTHIOprine (IMURAN) 50 MG tablet Take 50 mg by mouth every morning.     gabapentin (NEURONTIN) 300 MG capsule Take 1 capsule (300 mg total) by mouth 3 (three) times daily. 90 capsule 0   Ipratropium-Albuterol (COMBIVENT) 20-100 MCG/ACT AERS respimat Inhale 1 puff into the lungs every morning.     Multiple Vitamin (MULTIVITAMIN) tablet Take 1 tablet by mouth daily.     pantoprazole (PROTONIX) 40 MG tablet Take 1 tablet (40 mg total) by mouth every morning. 30 tablet 11   phentermine 15 MG capsule Take 15 mg by mouth every morning.     Rimegepant Sulfate 75 MG TBDP Take by mouth.     rizatriptan (MAXALT) 10 MG tablet Take by mouth.     SUMAtriptan (IMITREX) 100 MG tablet Take 100 mg by mouth every 2 (two) hours as needed for migraine.      topiramate (TOPAMAX) 25 MG tablet Take 1-2 tablets (25-50 mg total) by mouth as directed. Start taking 25 mg daily for 15 days and then increase to 50 mg daily after that. 45 tablet 0   traZODone (DESYREL) 100 MG tablet Take 1 tablet (100 mg total) by mouth at bedtime. 90 tablet 0   valACYclovir (VALTREX) 1000 MG tablet Take 1,000 mg by mouth 2 (two) times daily.     warfarin (COUMADIN) 1 MG tablet TAKE 1 TABLET BY MOUTH NIGHTLY ALONG WITH 5MG  TABLET FOR A TOTAL DAILY DOSE OF 6MG       warfarin (COUMADIN) 5 MG tablet TAKE 1 TABLET BY MOUTH NIGHTLY ALONG WITH 1MG  TABLET FOR A TOTAL OF 6MG   DAILY     lamoTRIgine (LAMICTAL) 25 MG tablet Take 1 tablet (25 mg total) by mouth daily for 14 days. Stop taking after 2 weeks 14 tablet 0   No current facility-administered medications for this visit.     Musculoskeletal: Strength & Muscle Tone: within normal limits Gait & Station: normal Patient leans: N/A  Psychiatric Specialty Exam: Review of Systems  Musculoskeletal:  Positive for back pain.  Psychiatric/Behavioral:  Positive for sleep disturbance.        Mood swings-improving  All other systems reviewed and are negative.   Blood pressure 103/67, pulse 68, temperature 98.3 F (36.8 C), temperature source Skin, height 5\' 7"  (1.702 m), weight 221 lb 3.2 oz (100.3 kg), last menstrual period 07/17/2019.Body mass index is 34.64 kg/m.  General Appearance: Casual  Eye Contact:  Fair  Speech:  Clear and Coherent  Volume:  Normal  Mood:   mood swings improving  Affect:  Full Range  Thought Process:  Goal Directed and Descriptions of Associations: Intact  Orientation:  Full (Time, Place, and Person)  Thought Content: Logical   Suicidal Thoughts:  No  Homicidal Thoughts:  No  Memory:  Immediate;   Fair Recent;   Fair Remote;   Fair  Judgement:  Fair  Insight:  Fair  Psychomotor Activity:  Normal  Concentration:  Concentration: Fair and Attention Span: Fair  Recall:  FiservFair  Fund of Knowledge: Fair  Language: Fair  Akathisia:  No  Handed:  Right  AIMS (if indicated): not done  Assets:  Communication Skills Desire for Improvement Housing Social Support  ADL's:  Intact  Cognition: WNL  Sleep:  Poor   Screenings: Geneticist, molecularAIMS    Flowsheet Row [facility]  AIMS Total Score 0 0      AUDIT    Flowsheet Row [facility]  Alcohol Use Disorder Identification Test Final Score (AUDIT) 0      GAD-7    Flowsheet Row [facility]  Total GAD-7 Score 6 8 20 19 21       PHQ2-9    Flowsheet Row [facility]  PHQ-2 Total Score 2 2 6 6 6   PHQ-9 Total Score 12 12 21 19 18       Flowsheet Row [facility]  C-SSRS RISK CATEGORY No Risk No Risk No Risk        Assessment and Plan: Lisa StallingRonda Lee [name] is a 52 year old African-American female on disability, history of bipolar disorder, GAD, PTSD, currently interested in changing Lamictal to Topamax, discussed plan as noted below.  Plan Bipolar disorder type I most recent episode moderate-improving Reduce lamotrigine to 25 mg p.o. daily for the next 2 weeks and stop taking it. Start Topamax 25 mg p.o. daily for 15 days and increase to 50 mg p.o. daily after  that. Provided education about drug to drug interaction between lamotrigine and Topamax, lamotrigine reduces the efficacy of Topamax and increases adverse side effects. Patient also on warfarin, discussed drug to drug interaction between warfarin and Topamax-Warfarin: (Moderate) Closely monitor the INR if coadministration of warfarin with topiramate is necessary as concurrent use may decrease the exposure of warfarin leading to reduced efficacy; increased bleeding is also possible with the combination. Topiramate is a weak CYP3A4 inducer and the R-enantiomer of warfarin is a CYP3A4 substrate. The S-enantiomer of warfarin exhibits 2 to 5 times more anticoagulant activity than the R-enantiomer, but the R-enantiomer generally has a slower clearance.  Patient advised to contact her provider who is prescribing warfarin before initiation of Topamax.  GAD-improving Will consider an SSRI in the future. Hydroxyzine 25 mg p.o. twice daily as needed Patient was advised to contact [facility] and to start CBT.  PTSD-chronic-stable Continue trazodone 100 mg p.o. nightly Referred for CBT  Insomnia-unstable Patient to work on sleep hygiene techniques. Patient encouraged to take the trazodone 100 mg p.o. nightly Patient will need sufficient management of pain Patient to be compliant on CPAP, that will also help with sleep.  Follow-up in clinic in 6 weeks or sooner if needed.  Consent: Patient/Guardian gives verbal consent for treatment and assignment of benefits for services provided during this visit. Patient/Guardian expressed understanding and agreed to proceed.   This note was generated in part or whole with voice recognition software. Voice recognition is usually quite accurate but there are transcription errors that can and very often do occur. I apologize for any typographical errors that were not detected and corrected.    Jomarie Longs, MD 10/22/2022, 6:08 PM

## 2022-10-25 ENCOUNTER — Other Ambulatory Visit: Payer: Medicare Other

## 2022-11-09 ENCOUNTER — Other Ambulatory Visit: Payer: Self-pay | Admitting: Neurosurgery

## 2022-11-09 DIAGNOSIS — Z9889 Other specified postprocedural states: Secondary | ICD-10-CM

## 2022-11-09 DIAGNOSIS — M5416 Radiculopathy, lumbar region: Secondary | ICD-10-CM

## 2022-11-09 MED ORDER — GABAPENTIN 300 MG PO CAPS: 300.0000 mg | ORAL_CAPSULE | Freq: Three times a day (TID) | ORAL | 0 refills | Status: DC

## 2022-11-09 NOTE — Telephone Encounter (Signed)
From: Lisa [name] To: Office of Smiley Houseman, [place] Sent: 11/09/2022 10:10 AM EDT Subject: Medication Renewal Request  Refills have been requested for the following medications:   gabapentin (NEURONTIN) 300 MG capsule Lisa Leach M]  Patient Comment: Can the mg be increased? I take this along with 4 Tylenol 3x's a day even after my last round of injections.   Preferred pharmacy: [id number] - [place]), [place] - [address] Delivery method: [employer]

## 2022-11-18 ENCOUNTER — Other Ambulatory Visit: Payer: Self-pay | Admitting: Psychiatry

## 2022-11-18 DIAGNOSIS — F4310 Post-traumatic stress disorder, unspecified: Secondary | ICD-10-CM

## 2022-11-18 DIAGNOSIS — F3132 Bipolar disorder, current episode depressed, moderate: Secondary | ICD-10-CM

## 2022-11-18 DIAGNOSIS — F411 Generalized anxiety disorder: Secondary | ICD-10-CM

## 2022-11-18 MED ORDER — TOPIRAMATE 25 MG PO TABS: 50.0000 mg | ORAL_TABLET | Freq: Every day | ORAL | 0 refills | Status: DC

## 2022-11-18 NOTE — Telephone Encounter (Signed)
I have sent Topamax 50 mg p.o. daily to [facility] pharmacy.

## 2022-11-18 NOTE — Telephone Encounter (Signed)
From: Kristeen Miss Thornton-Jones To: Office of Jomarie Longs, MD Sent: 11/16/2022 9:28 AM EDT Subject: Medication Renewal Request  Refills have been requested for the following medications:   topiramate (TOPAMAX) 25 MG tablet [Amberlynn Tempesta]  Patient Comment: I run out on the 10th  Preferred pharmacy: [id number] - [place] ([place]), [place] - [address] Delivery method: [employer]

## 2022-12-03 ENCOUNTER — Ambulatory Visit (INDEPENDENT_AMBULATORY_CARE_PROVIDER_SITE_OTHER): Payer: Medicare PPO | Admitting: Psychiatry

## 2022-12-03 ENCOUNTER — Encounter: Payer: Self-pay | Admitting: Psychiatry

## 2022-12-03 VITALS — BP 106/70 | HR 57 | Temp 97.5°F | Ht 67.0 in | Wt 217.0 lb

## 2022-12-03 DIAGNOSIS — G4701 Insomnia due to medical condition: Secondary | ICD-10-CM | POA: Diagnosis not present

## 2022-12-03 DIAGNOSIS — F411 Generalized anxiety disorder: Secondary | ICD-10-CM | POA: Diagnosis not present

## 2022-12-03 DIAGNOSIS — F4310 Post-traumatic stress disorder, unspecified: Secondary | ICD-10-CM | POA: Diagnosis not present

## 2022-12-03 DIAGNOSIS — F3175 Bipolar disorder, in partial remission, most recent episode depressed: Secondary | ICD-10-CM | POA: Diagnosis not present

## 2022-12-03 MED ORDER — TOPIRAMATE 25 MG PO TABS: 50.0000 mg | ORAL_TABLET | Freq: Every day | ORAL | 0 refills | Status: DC

## 2022-12-03 MED ORDER — TRAZODONE HCL 100 MG PO TABS: 100.0000 mg | ORAL_TABLET | Freq: Every day | ORAL | 0 refills | Status: DC

## 2022-12-03 MED ORDER — HYDROXYZINE HCL 25 MG PO TABS: 25.0000 mg | ORAL_TABLET | Freq: Two times a day (BID) | ORAL | 0 refills | Status: DC | PRN

## 2022-12-03 NOTE — Progress Notes (Signed)
BH MD OP Progress Note  12/03/2022 1:58 PM Lisa [name]  MRN:  [id number]  Chief Complaint:  Chief Complaint  Patient presents with   Follow-up   Anxiety   Depression   Medication Refill   HPI: Lisa [name] is a 52 year old African-American female with history of bipolar disorder, GAD, PTSD, chronic migraine, obstructive sleep apnea on oxygen, generalized sensory multiple polyneuropathy, chronic back pain with radiculopathy, hypovitaminosis, vitamin D and vitamin B6 deficiency, history of gastric bypass status post L4-5 and L5-S1 decompression on 01/19/2022 status post exploration, evacuation of seroma, hematoma-01/30/2022-presented for medication management.  Patient today reports she continues to be in significant pain of her back.  She currently gets injections however that does not seem to help.  She is currently scheduled for acupuncture.  She may need surgery in the future again.  That worries her.  Patient reports she is trying to lose weight.  She however is unable to move much because of the pain and hence has been difficult to lose weight.  She is currently on phentermine 37.5 mg daily.  That does seem to help with the appetite suppression.  Patient also reports the Topamax as beneficial.  Does report dry mouth however not sure if this is due to Topamax.  Patient does report current mood symptoms mostly anxiety due to her current situational stressors at home.  She however currently does not have a therapist.  Motivated to establish care.  Patient continues to struggle with sleep problems.  She could not tolerate the CPAP mask, currently waiting for another mask.  Patient denies any suicidality, homicidality or perceptual disturbances.  Patient denies any other concerns today.  Visit Diagnosis:    ICD-10-CM   1. Bipolar disorder, in partial remission, most recent episode depressed (HCC)  F31.75 topiramate (TOPAMAX) 25 MG tablet    traZODone (DESYREL) 100  MG tablet   Type 1    2. GAD (generalized anxiety disorder)  F41.1 hydrOXYzine (ATARAX) 25 MG tablet    topiramate (TOPAMAX) 25 MG tablet    traZODone (DESYREL) 100 MG tablet    3. PTSD (post-traumatic stress disorder)  F43.10 hydrOXYzine (ATARAX) 25 MG tablet    topiramate (TOPAMAX) 25 MG tablet    4. Insomnia due to medical condition  G47.01 hydrOXYzine (ATARAX) 25 MG tablet   Pain, OSA      Past Psychiatric History: I have reviewed past psychiatric history from progress note on 02/19/2019.  Past trials of medications like risperidone, Invega, Celexa, lithium, Topamax, hydroxyzine, trazodone.  Past Medical History:  Past Medical History:  Diagnosis Date   Abdominal pain    d/t ongoing menstrual bleeding   Anemia    iron deficiency, receiving Feraheme transfusions   Arthritis    Asthma    nonspecific interstitial pneumonia.Marland Kitchendiagnosed 6 years ago   Bipolar 1 disorder (HCC)    Blood in stool    happens often but unsure of etiology.  will have colonoscopy   Change in voice    Chest pain    has often but is due to lung disease   Constipation    Depression    Diverticulitis    DVT (deep venous thrombosis) (HCC) 12/13/2014   Dysrhythmia    Fatigue    Gallstones 04/10/2011   with biliary pancreatitis   GERD (gastroesophageal reflux disease)    History of blood transfusion 02/2019   one unit d/t hgb being low   History of hiatal hernia    small   History of  kidney stones    History of methicillin resistant staphylococcus aureus (MRSA)    Hypertension    Idiopathic non-specific interstitial pneumonitis (HCC)    Migraines    MRSA carrier    many years ago.   Nausea    Pancreatitis    Pre-diabetes    PTSD (post-traumatic stress disorder)    Pulmonary embolism (HCC) 12/13/2014   Sleep apnea    does not use cpap now.  will be retested soon   Takes iron supplements    FERAHEME weekly transfusions as well as ferrous sulfate PO   Trouble swallowing    Vocal cord polyps      Past Surgical History:  Procedure Laterality Date   BACK SURGERY     BRAVO PH STUDY  06/02/2011   Procedure: BRAVO PH STUDY;  Surgeon: Louis Meckel, MD;  Location: [facility];  Service: Endoscopy;  Laterality: N/A;   COLONOSCOPY     CYSTOSCOPY  08/15/2019   Procedure: CYSTOSCOPY;  Surgeon: Vena Austria, MD;  Location: [facility];  Service: Gynecology;;   DILATION AND CURETTAGE OF UTERUS     ENDOMETRIAL ABLATION N/A 03/30/2019   Procedure: ENDOMETRIAL ABLATION;  Surgeon: Vena Austria, MD;  Location: [facility];  Service: Gynecology;  Laterality: N/A;   ESOPHAGOGASTRODUODENOSCOPY  06/02/2011   Procedure: ESOPHAGOGASTRODUODENOSCOPY (EGD);  Surgeon: Louis Meckel, MD;  Location: [facility];  Service: Endoscopy;  Laterality: N/A;   GASTRIC BYPASS  2021   HYSTEROSCOPY  03/30/2019   Procedure: HYSTEROSCOPY;  Surgeon: Vena Austria, MD;  Location: [facility];  Service: Gynecology;;   LAPAROSCOPIC CHOLECYSTECTOMY W/ CHOLANGIOGRAPHY  04/25/2011   Dr Jamey Ripa   LUMBAR LAMINECTOMY FOR EPIDURAL ABSCESS N/A 01/30/2022   Procedure: LUMBAR LAMINECTOMY FOR EPIDURAL ABSCESS;  Surgeon: Venetia Night, MD;  Location: [facility];  Service: Neurosurgery;  Laterality: N/A;  Per consent and verbal confirmation with the surgeon, the procedure performed was wound exploration, evacuation of epidural fluid collection   LUMBAR LAMINECTOMY/DECOMPRESSION MICRODISCECTOMY N/A 01/19/2022   Procedure: L4-S1 DECOMPRESSION;  Surgeon: Venetia Night, MD;  Location: [facility];  Service: Neurosurgery;  Laterality: N/A;   NASAL POLYP SURGERY     polyp from vocal cords   TOTAL LAPAROSCOPIC HYSTERECTOMY WITH SALPINGECTOMY Bilateral 08/15/2019   Procedure: TOTAL LAPAROSCOPIC HYSTERECTOMY WITH SALPINGECTOMY;  Surgeon: Vena Austria, MD;  Location: [facility];  Service: Gynecology;  Laterality: Bilateral;   TUBAL LIGATION  1996   WISDOM TOOTH EXTRACTION      Family Psychiatric History: I have reviewed  family psychiatric history from progress note on 02/19/2019.  Family History:  Family History  Problem Relation Age of Onset   Bipolar disorder Mother    Diabetes Mother    Pancreatic cancer Mother    Alcohol abuse Father    Lung cancer Father    Bipolar disorder Sister    Alcohol abuse Paternal Aunt    Cancer Paternal Aunt 0   Alcohol abuse Paternal Uncle    Alcohol abuse Paternal Grandfather    Bipolar disorder Niece    Anesthesia problems Neg Hx    Hypotension Neg Hx    Malignant hyperthermia Neg Hx    Pseudochol deficiency Neg Hx     Social History: I have reviewed social history from progress note on 02/19/2019. Social History   Socioeconomic History   Marital status: Divorced    Spouse name: Not on file   Number of children: 7   Years of education: Not on file   Highest education level: Some college, no degree  Occupational History    Comment: disability  Tobacco Use   Smoking status: Never   Smokeless tobacco: Never  Vaping Use   Vaping Use: Never used  Substance and Sexual Activity   Alcohol use: Not Currently    Comment: rare   Drug use: No   Sexual activity: Yes    Partners: Male    Birth control/protection: Surgical  Other Topics Concern   Not on file  Social History Narrative   In [place]; with 2 sons; used to work in Physicist, medical. Never smoked; ocassional alcohol.    Social Determinants of Health   Financial Resource Strain: Not on file  Food Insecurity: Not on file  Transportation Needs: Not on file  Physical Activity: Not on file  Stress: Not on file  Social Connections: Not on file    Allergies:  Allergies  Allergen Reactions   Lisinopril Anaphylaxis    Facial swelling, difficulty breathing   Morphine And Codeine Other (See Comments)    migraine    Metabolic Disorder Labs: Lab Results  Component Value Date   HGBA1C 5.6 03/18/2014   Lab Results  Component Value Date   PROLACTIN 24.2 (H) 03/03/2019   No results found for: "CHOL",  "TRIG", "HDL", "CHOLHDL", "VLDL", "LDLCALC" Lab Results  Component Value Date   TSH 1.888 03/02/2019    Therapeutic Level Labs: No results found for: "LITHIUM" No results found for: "VALPROATE" No results found for: "CBMZ"  Current Medications: Current Outpatient Medications  Medication Sig Dispense Refill   AIMOVIG 140 MG/ML SOAJ Inject 140 mg into the skin every 30 (thirty) days.     albuterol (PROVENTIL HFA;VENTOLIN HFA) 108 (90 Base) MCG/ACT inhaler Inhale 2 puffs into the lungs every 6 (six) hours as needed for wheezing or shortness of breath. 1 Inhaler 2   azaTHIOprine (IMURAN) 50 MG tablet Take 50 mg by mouth every morning.     gabapentin (NEURONTIN) 300 MG capsule Take 1 capsule (300 mg total) by mouth 3 (three) times daily. Take 300mg  morning and afternoon and 600mg  at night (Patient taking differently: Take 300 mg by mouth as directed. Take 300mg  bid  and afternoon and 600mg  at night) 120 capsule 0   Ipratropium-Albuterol (COMBIVENT) 20-100 MCG/ACT AERS respimat Inhale 1 puff into the lungs every morning.     Multiple Vitamin (MULTIVITAMIN) tablet Take 1 tablet by mouth daily.     pantoprazole (PROTONIX) 40 MG tablet Take 1 tablet (40 mg total) by mouth every morning. 30 tablet 11   phentermine 15 MG capsule Take 37.5 mg by mouth every morning.     Rimegepant Sulfate 75 MG TBDP Take by mouth.     rizatriptan (MAXALT) 10 MG tablet Take by mouth.     SUMAtriptan (IMITREX) 100 MG tablet Take 100 mg by mouth every 2 (two) hours as needed for migraine.      valACYclovir (VALTREX) 1000 MG tablet Take 1,000 mg by mouth 2 (two) times daily.     warfarin (COUMADIN) 1 MG tablet TAKE 1 TABLET BY MOUTH NIGHTLY ALONG WITH 5MG  TABLET FOR A TOTAL DAILY DOSE OF 6MG      warfarin (COUMADIN) 5 MG tablet TAKE 1 TABLET BY MOUTH NIGHTLY ALONG WITH 1MG  TABLET FOR A TOTAL OF 6MG  DAILY     hydrOXYzine (ATARAX) 25 MG tablet Take 1 tablet (25 mg total) by mouth 2 (two) times daily as needed for anxiety.  180 tablet 0   topiramate (TOPAMAX) 25 MG tablet Take 2 tablets (50 mg total) by mouth  daily. 180 tablet 0   traZODone (DESYREL) 100 MG tablet Take 1 tablet (100 mg total) by mouth at bedtime. 90 tablet 0   No current facility-administered medications for this visit.     Musculoskeletal: Strength & Muscle Tone: within normal limits Gait & Station: normal Patient leans: N/A  Psychiatric Specialty Exam: Review of Systems  Musculoskeletal:  Positive for back pain.  Psychiatric/Behavioral:  Positive for sleep disturbance. The patient is nervous/anxious.     Blood pressure 106/70, pulse (!) 57, temperature (!) 97.5 F (36.4 C), temperature source Skin, height 5\' 7"  (1.702 m), weight 217 lb (98.4 kg), last menstrual period 07/17/2019.Body mass index is 33.99 kg/m.  General Appearance: Casual  Eye Contact:  Fair  Speech:  Clear and Coherent  Volume:  Normal  Mood:  Anxious  Affect:  Congruent  Thought Process:  Goal Directed and Descriptions of Associations: Intact  Orientation:  Full (Time, Place, and Person)  Thought Content: Logical   Suicidal Thoughts:  No  Homicidal Thoughts:  No  Memory:  Immediate;   Fair Recent;   Fair Remote;   Fair  Judgement:  Fair  Insight:  Fair  Psychomotor Activity:  Normal  Concentration:  Concentration: Fair and Attention Span: Fair  Recall:  [employer] of Knowledge: Fair  Language: Fair  Akathisia:  No  Handed:  Right  AIMS (if indicated): not done  Assets:  Communication Skills Desire for Improvement Housing Social Support  ADL's:  Intact  Cognition: WNL  Sleep:   restless   Screenings: Geneticist, molecular Office Visit from 08/26/2022 in [facility] Office Visit from 05/14/2022 in [facility]  AIMS Total Score 0 0      AUDIT    Flowsheet Row [facility]  Alcohol Use  Disorder Identification Test Final Score (AUDIT) 0      GAD-7    Flowsheet Row Office Visit from 12/03/2022 in [facility] Office Visit from 10/21/2022 in [facility] Office Visit from 08/26/2022 in [facility] Visit from 05/14/2022 in [facility] Office Visit from 04/02/2022 in [facility]  Total GAD-7 Score 9 6 8 20 19       PHQ2-9    Flowsheet Row Office Visit from 12/03/2022 in [facility] Office Visit from 10/21/2022 in [facility] Office Visit from 08/26/2022 in [facility] Office Visit from 05/14/2022 in [facility] Office Visit from 04/02/2022 in [facility]  PHQ-2 Total Score 2 2 2 6 6   PHQ-9 Total Score 9 12 12 21 19       Flowsheet Row Office Visit from 12/03/2022 in [facility] Office Visit from 10/21/2022 in [facility] Office Visit from 08/26/2022 in [facility]  C-SSRS RISK CATEGORY No Risk No Risk No Risk        Assessment and Plan: Lisa [name] is a 52 year old African-American female, on disability, history of bipolar disorder, GAD, PTSD, currently struggling with back pain, and also sleep problems mostly due to uncorrected sleep apnea and back pain, reports mood symptoms otherwise responding to Topamax.  Plan as noted below.  Plan Bipolar  disorder type I most recent episode depressed in partial remission Discontinue lamotrigine. Continue Topamax 50 mg p.o. daily.  Will consider increasing this dosage in the future as needed. She is also on  gabapentin also for pain, which is a good mood stabilizer as well.   GAD-improving Patient to consider psychotherapy-provided resources Hydroxyzine 25 mg p.o. twice daily as needed  PTSD-chronic-stable Continue trazodone 100 mg p.o. nightly Patient advised to establish care with therapist.  Insomnia-unstable-multifactorial. Patient will need sufficient pain management. Patient to be compliant on CPAP, currently awaiting a new mask for OSA. Trazodone 100 mg p.o. nightly  Follow-up in clinic in 3 months or sooner if needed. Collaboration of Care: Collaboration of Care: Referral or follow-up with counselor/therapist AEB patient encouraged to establish care with therapist.  Patient/Guardian was advised Release of Information must be obtained prior to any record release in order to collaborate their care with an outside provider. Patient/Guardian was advised if they have not already done so to contact the registration department to sign all necessary forms in order for [place] to release information regarding their care.   Consent: Patient/Guardian gives verbal consent for treatment and assignment of benefits for services provided during this visit. Patient/Guardian expressed understanding and agreed to proceed.   This note was generated in part or whole with voice recognition software. Voice recognition is usually quite accurate but there are transcription errors that can and very often do occur. I apologize for any typographical errors that were not detected and corrected.    Jomarie Longs, MD 12/03/2022, 1:58 PM

## 2023-01-13 ENCOUNTER — Other Ambulatory Visit: Payer: Self-pay | Admitting: Neurosurgery

## 2023-01-13 DIAGNOSIS — M5416 Radiculopathy, lumbar region: Secondary | ICD-10-CM

## 2023-01-13 DIAGNOSIS — Z9889 Other specified postprocedural states: Secondary | ICD-10-CM

## 2023-01-13 NOTE — Telephone Encounter (Signed)
Refill of neurontin okay and sent to pharmacy. She also sent an appt request and I agree that she needs to be seen if we are going to continue to prescribe medications. Last visit was 08/19/22.

## 2023-02-04 ENCOUNTER — Encounter: Payer: Self-pay | Admitting: Neurology

## 2023-02-04 ENCOUNTER — Telehealth: Payer: Self-pay

## 2023-02-04 ENCOUNTER — Other Ambulatory Visit: Payer: Self-pay

## 2023-02-04 ENCOUNTER — Encounter: Payer: Self-pay | Admitting: Neurosurgery

## 2023-02-04 ENCOUNTER — Ambulatory Visit (INDEPENDENT_AMBULATORY_CARE_PROVIDER_SITE_OTHER): Payer: Medicare PPO | Admitting: Neurosurgery

## 2023-02-04 VITALS — BP 118/78 | Wt 206.0 lb

## 2023-02-04 DIAGNOSIS — M5416 Radiculopathy, lumbar region: Secondary | ICD-10-CM | POA: Diagnosis not present

## 2023-02-04 DIAGNOSIS — R202 Paresthesia of skin: Secondary | ICD-10-CM

## 2023-02-04 NOTE — Telephone Encounter (Signed)
Please let her know that Duwayne Heck said she needs to get this from her PCP.

## 2023-02-04 NOTE — Progress Notes (Signed)
Follow-up note: Referring Physician:  [facility] [address] [place],  [place] [address]  Primary Physician:  [facility]  Chief Complaint:  continued low back and right leg pain  History of Present Illness: Lisa [name] is a 52 y.o. female who presents today for follow up after PT and ESIs. She underwent 3 session of PT from 11/18/22-/12/02/22 and had L5-S1 ESI on 09/18/22 and 10/22/22.  Unfortunately while she did get some temporary relief from her injections, she continues to have significant back pain and radiating leg pain into her toes.  She has constant numbness.  These things are affecting her quality of life.  08/19/22 Lisa [name] is a 52 y.o presenting today via telephone visit to review her MRI results.  She continues to have right radiating leg pain.  She has been taking gabapentin 300 mg twice daily without significant relief   07/28/22 Lisa [name] is a 52 y.o presenting today for follow-up.  She states that she has had recurrence of right posterior radiating leg pain over the last month with persistent numbness.  She is currently taking Tylenol with minimal relief.     03/03/22 Lisa [name] is status post the above surgeries.  She has no in her right leg, but her pain is much improved compared to preop.   Review of Systems:  A 10 point review of systems is negative, and the pertinent positives and negatives detailed in the HPI.  Past Medical History: Past Medical History:  Diagnosis Date   Abdominal pain    d/t ongoing menstrual bleeding   Anemia    iron deficiency, receiving Feraheme transfusions   Arthritis    Asthma    nonspecific interstitial pneumonia.Marland Kitchendiagnosed 6 years ago   Bipolar 1 disorder (HCC)    Blood in stool    happens often but unsure of etiology.  will have colonoscopy   Change in voice    Chest pain    has often but is due to lung disease   Constipation    Depression     Diverticulitis    DVT (deep venous thrombosis) (HCC) 12/13/2014   Dysrhythmia    Fatigue    Gallstones 04/10/2011   with biliary pancreatitis   GERD (gastroesophageal reflux disease)    History of blood transfusion 02/2019   one unit d/t hgb being low   History of hiatal hernia    small   History of kidney stones    History of methicillin resistant staphylococcus aureus (MRSA)    Hypertension    Idiopathic non-specific interstitial pneumonitis (HCC)    Migraines    MRSA carrier    many years ago.   Nausea    Pancreatitis    Pre-diabetes    PTSD (post-traumatic stress disorder)    Pulmonary embolism (HCC) 12/13/2014   Sleep apnea    does not use cpap now.  will be retested soon   Takes iron supplements    FERAHEME weekly transfusions as well as ferrous sulfate PO   Trouble swallowing    Vocal cord polyps     Past Surgical History: Past Surgical History:  Procedure Laterality Date   BACK SURGERY     BRAVO PH STUDY  06/02/2011   Procedure: BRAVO PH STUDY;  Surgeon: Louis Meckel, MD;  Location: [facility];  Service: Endoscopy;  Laterality: N/A;   COLONOSCOPY     CYSTOSCOPY  08/15/2019   Procedure: CYSTOSCOPY;  Surgeon: Vena Austria, MD;  Location:  [facility];  Service: Gynecology;;   DILATION AND CURETTAGE OF UTERUS     ENDOMETRIAL ABLATION N/A 03/30/2019   Procedure: ENDOMETRIAL ABLATION;  Surgeon: Vena Austria, MD;  Location: [facility];  Service: Gynecology;  Laterality: N/A;   ESOPHAGOGASTRODUODENOSCOPY  06/02/2011   Procedure: ESOPHAGOGASTRODUODENOSCOPY (EGD);  Surgeon: Louis Meckel, MD;  Location: [facility];  Service: Endoscopy;  Laterality: N/A;   GASTRIC BYPASS  2021   HYSTEROSCOPY  03/30/2019   Procedure: HYSTEROSCOPY;  Surgeon: Vena Austria, MD;  Location: [facility];  Service: Gynecology;;   LAPAROSCOPIC CHOLECYSTECTOMY W/ CHOLANGIOGRAPHY  04/25/2011   Dr Jamey Ripa   LUMBAR LAMINECTOMY FOR EPIDURAL ABSCESS N/A 01/30/2022   Procedure: LUMBAR  LAMINECTOMY FOR EPIDURAL ABSCESS;  Surgeon: Venetia Night, MD;  Location: [facility];  Service: Neurosurgery;  Laterality: N/A;  Per consent and verbal confirmation with the surgeon, the procedure performed was wound exploration, evacuation of epidural fluid collection   LUMBAR LAMINECTOMY/DECOMPRESSION MICRODISCECTOMY N/A 01/19/2022   Procedure: L4-S1 DECOMPRESSION;  Surgeon: Venetia Night, MD;  Location: [facility];  Service: Neurosurgery;  Laterality: N/A;   NASAL POLYP SURGERY     polyp from vocal cords   TOTAL LAPAROSCOPIC HYSTERECTOMY WITH SALPINGECTOMY Bilateral 08/15/2019   Procedure: TOTAL LAPAROSCOPIC HYSTERECTOMY WITH SALPINGECTOMY;  Surgeon: Vena Austria, MD;  Location: [facility];  Service: Gynecology;  Laterality: Bilateral;   TUBAL LIGATION  1996   WISDOM TOOTH EXTRACTION      Allergies: Allergies as of 02/04/2023 - Review Complete 02/04/2023  Allergen Reaction Noted   Lisinopril Anaphylaxis 08/10/2017   Morphine and codeine Other (See Comments) 08/04/2019    Medications: Outpatient Encounter Medications as of 02/04/2023  Medication Sig   AIMOVIG 140 MG/ML SOAJ Inject 140 mg into the skin every 30 (thirty) days.   albuterol (PROVENTIL HFA;VENTOLIN HFA) 108 (90 Base) MCG/ACT inhaler Inhale 2 puffs into the lungs every 6 (six) hours as needed for wheezing or shortness of breath.   azaTHIOprine (IMURAN) 50 MG tablet Take 50 mg by mouth every morning.   gabapentin (NEURONTIN) 300 MG capsule TAKE 1 CAPSULE BY MOUTH IN THE MORNING AND AFTERNOON AND TAKE 2 CAPULES AT NIGHT   hydrOXYzine (ATARAX) 25 MG tablet Take 1 tablet (25 mg total) by mouth 2 (two) times daily as needed for anxiety.   Ipratropium-Albuterol (COMBIVENT) 20-100 MCG/ACT AERS respimat Inhale 1 puff into the lungs every morning.   Multiple Vitamin (MULTIVITAMIN) tablet Take 1 tablet by mouth daily.   phentermine 15 MG capsule Take 37.5 mg by mouth every morning.   Rimegepant Sulfate 75 MG TBDP Take by  mouth.   rizatriptan (MAXALT) 10 MG tablet Take by mouth.   SUMAtriptan (IMITREX) 100 MG tablet Take 100 mg by mouth every 2 (two) hours as needed for migraine.    topiramate (TOPAMAX) 25 MG tablet Take 2 tablets (50 mg total) by mouth daily.   traZODone (DESYREL) 100 MG tablet Take 1 tablet (100 mg total) by mouth at bedtime.   valACYclovir (VALTREX) 1000 MG tablet Take 1,000 mg by mouth 2 (two) times daily.   warfarin (COUMADIN) 1 MG tablet TAKE 1 TABLET BY MOUTH NIGHTLY ALONG WITH 5MG  TABLET FOR A TOTAL DAILY DOSE OF 6MG    warfarin (COUMADIN) 5 MG tablet TAKE 1 TABLET BY MOUTH NIGHTLY ALONG WITH 1MG  TABLET FOR A TOTAL OF 6MG  DAILY   pantoprazole (PROTONIX) 40 MG tablet Take 1 tablet (40 mg total) by mouth every morning.   No facility-administered encounter medications on file as of 02/04/2023.  Social History: Social History   Tobacco Use   Smoking status: Never   Smokeless tobacco: Never  Vaping Use   Vaping status: Never Used  Substance Use Topics   Alcohol use: Not Currently    Comment: rare   Drug use: No    Family Medical History: Family History  Problem Relation Age of Onset   Bipolar disorder Mother    Diabetes Mother    Pancreatic cancer Mother    Alcohol abuse Father    Lung cancer Father    Bipolar disorder Sister    Alcohol abuse Paternal Aunt    Cancer Paternal Aunt 0   Alcohol abuse Paternal Uncle    Alcohol abuse Paternal Grandfather    Bipolar disorder Niece    Anesthesia problems Neg Hx    Hypotension Neg Hx    Malignant hyperthermia Neg Hx    Pseudochol deficiency Neg Hx     Exam: Today's Vitals   02/04/23 1042  BP: 118/78  Weight: 93.4 kg  PainSc: 5   PainLoc: Leg   Body mass index is 32.26 kg/m.  CN II-XII grossly intact 5/5 throughout LLE. 4/5 throughout RLE except 5/5 plantar flexion. Some limitation secondary to pain. Positive SLR on the right  Gait is normal.  Imaging: 10/14/22 T spine MRI IMPRESSION: 1. No acute abnormality  of the thoracic spine. 2. Shallow left paracentral noncompressive disc protrusion at T7-T8. No evidence of significant foraminal or canal stenosis at any level.     Electronically Signed   By: Duanne Guess D.O.   On: 10/14/2022 16:54  08/20/22 MRI brain  IMPRESSION: Normal examination. No abnormality seen to explain headache.     Electronically Signed   By: Paulina Fusi M.D.   On: 08/21/2022 19:43  I have personally reviewed the images and agree with the above interpretation.  Assessment and Plan: Ms. Habermann is a pleasant 52 y.o. female with ongoing low back and right radiating leg pain.  Unfortunately conservative management has not helped her symptoms and she feels like things are progressively getting worse.  We will move forward with an EMG for further evaluation.  I have placed an order to [facility] for this.  Once it is completed and I have the results, I will review her case with Dr. Marcell Barlow to discuss next steps.  We may need to obtain a formal discharge note from physical therapy.  1 cc of been completed I will contact her via telephone visit to discuss her results and further plan of care.  She is encouraged to call the office in the interim should she have any questions or concerns.  She expressed understanding and was in agreement with this plan.   I spent a total of 32 minutes in both face-to-face and non-face-to-face activities for this visit on the date of this encounter including review of records, review of imaging, discussion of symptoms, discussion of plan of care, documentation, and order placement.  Manning Charity PA-C Neurosurgery

## 2023-02-04 NOTE — Telephone Encounter (Signed)
We have received a refill request for Pantoprazole 40mg , 1 po in the morning. This was prescribed by Dr. Emogene Morgan in July 2023.   Should this go to her PCP?

## 2023-02-09 ENCOUNTER — Ambulatory Visit: Payer: Medicare PPO | Admitting: Neurosurgery

## 2023-02-09 ENCOUNTER — Other Ambulatory Visit: Payer: Self-pay | Admitting: Orthopedic Surgery

## 2023-02-09 DIAGNOSIS — M5416 Radiculopathy, lumbar region: Secondary | ICD-10-CM

## 2023-02-09 DIAGNOSIS — Z9889 Other specified postprocedural states: Secondary | ICD-10-CM

## 2023-02-09 NOTE — Telephone Encounter (Signed)
Gabapentin refill sent to pharmacy. Please let her know.

## 2023-02-15 ENCOUNTER — Encounter: Payer: Self-pay | Admitting: Internal Medicine

## 2023-02-16 ENCOUNTER — Ambulatory Visit: Payer: Medicare PPO | Admitting: Family Medicine

## 2023-02-16 ENCOUNTER — Ambulatory Visit (INDEPENDENT_AMBULATORY_CARE_PROVIDER_SITE_OTHER): Payer: Medicare PPO | Admitting: Neurology

## 2023-02-16 ENCOUNTER — Encounter: Payer: Self-pay | Admitting: Family Medicine

## 2023-02-16 DIAGNOSIS — Z113 Encounter for screening for infections with a predominantly sexual mode of transmission: Secondary | ICD-10-CM

## 2023-02-16 DIAGNOSIS — M5417 Radiculopathy, lumbosacral region: Secondary | ICD-10-CM

## 2023-02-16 DIAGNOSIS — R202 Paresthesia of skin: Secondary | ICD-10-CM

## 2023-02-16 LAB — WET PREP FOR TRICH, YEAST, CLUE
Trichomonas Exam: NEGATIVE
Yeast Exam: NEGATIVE

## 2023-02-16 NOTE — Progress Notes (Addendum)
[facility] Department  STI clinic/screening visit [address] [place] [address] [phone]  Subjective:  Lisa [name] is a 52 y.o. female being seen today for an STI screening visit. The patient reports they do have symptoms.  Patient reports that they do not desire a pregnancy in the next year.   They reported they are not interested in discussing contraception today.    Patient's last menstrual period was 07/17/2019 (approximate).  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) 04/16/2022   Bipolar 1 disorder, depressed, moderate (HCC) 02/18/2022   GAD (generalized anxiety disorder) 02/18/2022   Cauda equina compression (HCC) 01/30/2022   Lumbar radiculopathy 01/19/2022   S/P gastric bypass 06/02/2021   S/P hysterectomy 08/15/2019   S/P laparoscopic hysterectomy 08/15/2019   Chronic constipation 03/21/2019   Menorrhagia 03/21/2019   Migraine 03/21/2019   Vocal cord polyps 03/21/2019   Acute deep vein thrombosis (DVT) of popliteal vein of right lower extremity (HCC) 03/15/2019   Iron deficiency anemia due to chronic blood loss 03/15/2019   Symptomatic anemia 03/03/2019   Asthma without status asthmaticus 08/10/2017   Depression 08/10/2017   Obesity, Class III, BMI 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) 08/10/2017   Prediabetes 04/19/2017   Essential hypertension 04/16/2017   DVT (deep venous thrombosis) (HCC) 12/13/2014   Pulmonary embolism (HCC) 12/13/2014   Nonspecific interstitial pneumonia (HCC) 08/22/2014   Cough, persistent 04/30/2014   Obstructive apnea 04/30/2014   Endometrial polyp 09/30/2013   High BMI 08/25/2013   Dysmenorrhea 08/25/2013   Abdominal bloating 08/25/2013   Schizoaffective disorder (HCC) 05/12/2013   PTSD (post-traumatic stress disorder)  05/12/2013   Bipolar I disorder, most recent episode (or current) depressed, unspecified 05/12/2013   Abdominal pain, generalized 05/28/2011   Esophageal reflux 05/28/2011    Chief Complaint  Patient presents with   SEXUALLY TRANSMITTED DISEASE    STI screening-has odor-thinks has BV    HPI  Patient reports to clinic for STI testing with 1 week of increased vaginal discharge and odor  Does the patient using douching products? Practitioner oversight forgot to ask  Last HIV test per patient/review of record was  Lab Results  Component Value Date   HMHIVSCREEN Negative - Validated 01/15/2022    Lab Results  Component Value Date   HIV NON REACTIVE 08/25/2013   Patient reports last pap was  Lab Results  Component Value Date   DIAGPAP  03/14/2019    NEGATIVE FOR INTRAEPITHELIAL LESIONS OR MALIGNANCY.    Lab Results  Component Value Date   [facility]  08/25/2013     Comment:     SATISFACTORY.  Endocervical/transformation zone component present.    Screening for MPX risk: Does the patient have an unexplained rash? No Is the patient MSM? No Does the patient endorse multiple sex partners or anonymous sex partners? No Did the patient have close or sexual contact with a person diagnosed with MPX? No Has the patient traveled outside the [place] where MPX is endemic? No Is there a high clinical suspicion for MPX-- evidenced by one of the following No  -Unlikely to be chickenpox  -Lymphadenopathy  -Rash that present in same phase of evolution on any given body part See flowsheet for further details and programmatic requirements.   Immunization history:  Immunization History  Administered Date(s) Administered   Influenza,inj,Quad PF,6+ Mos  05/13/2013   Pneumococcal Polysaccharide-23 05/13/2013   Tdap 11/01/2010     The following portions of the patient's history were reviewed and updated as appropriate: allergies, current medications, past medical history, past social history,  past surgical history and problem list.  Objective:  There were no vitals filed for this visit.  Physical Exam Vitals and nursing note reviewed.  Constitutional:      Appearance: She is obese.  HENT:     Head: Normocephalic and atraumatic.     Mouth/Throat:     Mouth: Mucous membranes are moist.     Pharynx: Oropharynx is clear. No oropharyngeal exudate or posterior oropharyngeal erythema.  Pulmonary:     Effort: Pulmonary effort is normal.  Abdominal:     General: Abdomen is flat.     Palpations: There is no mass.     Tenderness: There is no abdominal tenderness. There is no rebound.  Genitourinary:    General: Normal vulva.     Exam position: Lithotomy position.     Pubic Area: No rash or pubic lice.      Labia:        Right: No rash or lesion.        Left: No rash or lesion.      Vagina: Vaginal discharge present. No erythema, bleeding or lesions.     Cervix: No cervical motion tenderness.     Uterus: Normal.      Adnexa: Right adnexa normal and left adnexa normal.     Rectum: Normal.     Comments: pH = 4  Mild amt of white discharge present  -unable to visualized cervix due to body habitus Lymphadenopathy:     Head:     Right side of head: No preauricular or posterior auricular adenopathy.     Left side of head: No preauricular or posterior auricular adenopathy.     Cervical: No cervical adenopathy.     Upper Body:     Right upper body: No supraclavicular, axillary or epitrochlear adenopathy.     Left upper body: No supraclavicular, axillary or epitrochlear adenopathy.     Lower Body: No right inguinal adenopathy. No left inguinal adenopathy.  Skin:    General: Skin is warm and dry.     Findings: No rash.  Neurological:     Mental Status: She is alert and oriented to person, place, and time.      Assessment and Plan:  Lisa [name] is a 52 y.o. female presenting to the [facility] Department for STI screening  1. Screening for  venereal disease -pt complained of pressure and increased freq of urination- encouraged to seek PCP or urgent care with continued symptoms for UTI eval  - Chlamydia/Gonorrhea [facility] - HIV [place] [facility] - Syphilis Serology, [facility] - WET PREP FOR TRICH, YEAST, CLUE   Patient accepted all screenings including vaginal CT/GC and bloodwork for HIV/RPR, and wet prep. Patient meets criteria for HepB screening? No. Ordered? not applicable Patient meets criteria for HepC screening? No. Ordered? not applicable  Treat wet prep per standing order Discussed time line for [facility] results and that patient will be called with positive results and encouraged patient to call if she had not heard in 2 weeks.  Counseled to return or seek care for continued or worsening symptoms Recommended repeat testing in 3 months with positive results. Recommended condom use with all sex  Patient is currently using [facility] to prevent  pregnancy.    Return if symptoms worsen or fail to improve, for STI screening.  Future Appointments  Date Time Provider Department Center  03/05/2023  9:30 AM Jomarie Longs, MD [facility] None   Total time spent 20 minutes Lenice Llamas, [place]

## 2023-02-16 NOTE — Procedures (Addendum)
[facility]  [address] [place], [address]  [place], [place] [address] Tel: [phone] Fax: [phone] Test Date:  02/16/2023  Patient: Lisa [name] DOB: 1971/03/18 Physician: Jacquelyne Balint, MD  Sex: Female Height: 5\' 7"  Ref Phys: Susanne Borders, PA  ID#: [id number]   Technician:    History: This is a 52 year old female with low back pain and bilateral lower limb pain.  NCV & EMG Findings: Extensive electrodiagnostic evaluation of bilateral lower limbs shows: Bilateral sural and superficial peroneal/fibular sensory responses are within normal limits. Bilateral tibial (AH) motor responses show reduced amplitude (L1.97, R3.2 mV). Bilateral peroneal/fibular (EDB) motor responses are within normal limits. Bilateral H reflex studies are absent. Chronic motor axon loss changes without active denervation changes are seen in bilateral medial head of gastrocnemius, bilateral short head of biceps femoris, right tibialis anterior, and right gluteus medius muscles. Lumbar paraspinal muscles were deferred due to prior low back surgery and current use of anticoagulation.  Impression: This is an abnormal study. 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 The findings are mild to moderate electrically at bilateral S1 roots and mild in degree electrically at the right L5 root. No electrodiagnostic evidence of a large fiber sensorimotor neuropathy.    ___________________________ Jacquelyne Balint, MD    Nerve Conduction Studies Motor Nerve Results    Latency Amplitude F-Lat Segment Distance CV Comment  Site (ms) Norm (mV) Norm (ms)  (cm) (m/s) Norm   Left Fibular (EDB) Motor  Ankle 3.0  < 5.5 3.1  > 3.0        Bel fib head 10.1 - 3.1 -  Bel fib head-Ankle 34 48  > 40   Pop fossa 11.9 - 2.9 -  Pop fossa-Bel fib head 8 44 -   Right Fibular (EDB)  Motor  Ankle 3.0  < 5.5 5.3  > 3.0        Bel fib head 10.3 - 4.6 -  Bel fib head-Ankle 34 47  > 40   Pop fossa 12.0 - 4.4 -  Pop fossa-Bel fib head 8 47 -   Left Tibial (AH) Motor  Ankle 3.7  < 6.0 *1.97  > 8.0        Knee 12.7 - 1.65 -  Knee-Ankle 44 49  > 40   Right Tibial (AH) Motor  Ankle 3.6  < 6.0 *3.2  > 8.0        Knee 12.4 - 2.7 -  Knee-Ankle 43.5 49  > 40    Sensory Sites    Neg Peak Lat Amplitude (O-P) Segment Distance Velocity Comment  Site (ms) Norm (V) Norm  (cm) (ms)   Left Superficial Fibular Sensory  14 cm-Ankle 2.4  < 4.5 8  > 5 14 cm-Ankle 14    Right Superficial Fibular Sensory  14 cm-Ankle 1.73  < 4.5 6  > 5 14 cm-Ankle 14    Left Sural Sensory  Calf-Lat mall 2.8  < 4.5 8  > 5 Calf-Lat mall 12    Right Sural Sensory  Calf-Lat mall 3.0  < 4.5 9  > 5 Calf-Lat mall 14     H-Reflex Results    M-Lat H Lat H Neg Amp H-M Lat  Site (ms) (ms) Norm (mV) (ms)  Left Tibial H-Reflex  Pop fossa 7.4 -  < 35.0 - -  Right Tibial H-Reflex  Pop fossa 11.5 -  < 35.0 - -   Electromyography   Side Muscle Ins.Act Fibs Fasc Recrt Amp Dur Poly Activation Comment  Left Tib ant Nml Nml Nml Nml Nml Nml Nml Nml N/A  Left Gastroc MH Nml Nml Nml *1- *1+ *1+ *1+ Nml N/A  Left Vastus lat Nml Nml Nml Nml Nml Nml Nml Nml N/A  Left Biceps fem SH Nml Nml Nml *1- *1+ *1+ *1+ Nml N/A  Left Gluteus med Nml Nml Nml Nml Nml Nml Nml Nml N/A  Right Tib ant Nml Nml Nml *1- *1+ *1+ *1+ Nml N/A  Right Gastroc MH Nml Nml Nml *1- *1+ *1+ *1+ Nml N/A  Right Vastus lat Nml Nml Nml Nml Nml Nml Nml Nml N/A  Right Biceps fem SH Nml Nml Nml *1- *1+ *1+ *1+ Nml N/A  Right Gluteus med Nml Nml Nml *1- *1+ *1+ *1+ Nml N/A      Waveforms:  Motor           Sensory           H-Reflex

## 2023-02-16 NOTE — Addendum Note (Signed)
Addended by: Lenice Llamas on: 02/16/2023 01:57 PM   Modules accepted: Orders

## 2023-02-16 NOTE — Progress Notes (Signed)
Pt here for STI screening.  Wet mount results reviewed with patients.  No treatment needed at this time as per standing orders.  Pt counseled that if continued feelings of "pressure" may need to follow up with PCP to evaluate for UTI.  Verbalizes understanding.  Condoms declined.-Collins Scotland, RN

## 2023-02-22 ENCOUNTER — Encounter: Payer: Medicare PPO | Admitting: Neurology

## 2023-02-22 ENCOUNTER — Other Ambulatory Visit: Payer: Self-pay | Admitting: Student

## 2023-02-22 DIAGNOSIS — R202 Paresthesia of skin: Secondary | ICD-10-CM

## 2023-02-22 DIAGNOSIS — G43109 Migraine with aura, not intractable, without status migrainosus: Secondary | ICD-10-CM

## 2023-03-04 ENCOUNTER — Encounter: Payer: Self-pay | Admitting: Internal Medicine

## 2023-03-04 ENCOUNTER — Ambulatory Visit (INDEPENDENT_AMBULATORY_CARE_PROVIDER_SITE_OTHER): Payer: Medicare PPO | Admitting: Neurosurgery

## 2023-03-04 DIAGNOSIS — M5416 Radiculopathy, lumbar region: Secondary | ICD-10-CM

## 2023-03-04 NOTE — Progress Notes (Signed)
Neurosurgery Telephone (Audio-Only) Note  Requesting Provider     [place], [facility] [address] [place],  [place] [address] T: [phone] F: [phone]  Primary Care Provider [place], [facility] [address] [place] [address] T: [phone] F: [phone]  Telehealth visit was conducted with Lisa [name], a 52 y.o. female via telephone.  History of Present Illness: Lisa [name] a 52 year old woman to our office for chronic lumbosacral complaints and ongoing right leg pain.  Despite medication management, ESI's, and physical therapy she continues to have the symptoms.  We updated MRI which does not show any convincing compressive pathology.  I reviewed her symptoms with Dr. Marcell Barlow who recommended consideration of a spinal cord stimulator and I contacted the patient today to discuss these options.  Her symptoms are largely unchanged.  General Review of Systems:  A ROS was performed including pertinent positive and negatives as documented.  All other systems are negative.    Prior to Admission medications   Medication Sig Start Date End Date Taking? Authorizing Provider  AIMOVIG 140 MG/ML SOAJ Inject 140 mg into the skin every 30 (thirty) days. 02/19/19   [provider]  albuterol (PROVENTIL HFA;VENTOLIN HFA) 108 (90 Base) MCG/ACT inhaler Inhale 2 puffs into the lungs every 6 (six) hours as needed for wheezing or shortness of breath. 02/28/17   Nita Sickle, MD  azaTHIOprine (IMURAN) 50 MG tablet Take 50 mg by mouth every morning. 02/02/19   [provider]  gabapentin (NEURONTIN) 300 MG capsule TAKE 1 CAPSULE BY MOUTH IN THE MORNING AND AFTERNOON, AND TAKE 2 CAPSULES AT NIGHT 02/09/23   Drake Leach, PA-C  hydrOXYzine (ATARAX) 25 MG tablet Take 1 tablet (25 mg total) by mouth 2 (two) times daily as needed for anxiety. 12/03/22   Jomarie Longs, MD  Ipratropium-Albuterol (COMBIVENT) 20-100 MCG/ACT AERS respimat Inhale 1  puff into the lungs every morning.    [provider]  Multiple Vitamin (MULTIVITAMIN) tablet Take 1 tablet by mouth daily.    [provider]  pantoprazole (PROTONIX) 40 MG tablet Take 1 tablet (40 mg total) by mouth every morning. 01/24/22 01/24/23  Abd-El-Barr, Jerolyn Center, MD  phentermine 15 MG capsule Take 37.5 mg by mouth every morning.    [provider]  Rimegepant Sulfate 75 MG TBDP Take by mouth. 03/31/22   [provider]  rizatriptan (MAXALT) 10 MG tablet Take by mouth. 03/31/22 03/31/23  [provider]  SUMAtriptan (IMITREX) 100 MG tablet Take 100 mg by mouth every 2 (two) hours as needed for migraine.  02/10/19   [provider]  topiramate (TOPAMAX) 25 MG tablet Take 2 tablets (50 mg total) by mouth daily. 12/03/22   Jomarie Longs, MD  traZODone (DESYREL) 100 MG tablet Take 1 tablet (100 mg total) by mouth at bedtime. 12/03/22   Jomarie Longs, MD  valACYclovir (VALTREX) 1000 MG tablet Take 1,000 mg by mouth 2 (two) times daily.    [provider]  warfarin (COUMADIN) 1 MG tablet TAKE 1 TABLET BY MOUTH NIGHTLY ALONG WITH 5MG  TABLET FOR A TOTAL DAILY DOSE OF 6MG  08/17/22   [provider]  warfarin (COUMADIN) 5 MG tablet TAKE 1 TABLET BY MOUTH NIGHTLY ALONG WITH 1MG  TABLET FOR A TOTAL OF 6MG  DAILY 08/17/22   [provider]    DATA REVIEWED    Imaging Studies  EMG 02/16/23 Impression: This is an abnormal study. The findings are most consistent with the following: The residuals of old  intraspinal canal lesion(s) (ie: motor radiculopathy) at bilateral S1 and right L5 roots or segments. The findings are mild to moderate electrically at bilateral S1 roots and mild in degree electrically at the right L5 root. No electrodiagnostic evidence of a large fiber sensorimotor neuropathy.    IMPRESSION  Lisa [name] is a 52 y.o. female who I performed a telephone encounter today for evaluation and management of  chronic lumbosacral pain with radiculopathy  PLAN  Lisa [name] is a 52 year old with chronic lumbosacral radiculopathy.  We reviewed her EMG results and I discussed with her my discussion with Dr. Marcell Barlow which was considering moving forward with a spinal cord stimulator.  She is apprehensive about this and we briefly discussed the risk and benefits.  She would like to discuss this further with Dr. Cherylann Ratel before making a decision.  I will hold off on placing the referral for a psych evaluation given her ongoing apprehension.  She would also like to meet with a pain management doctor to discuss medication options and I will place a referral for this as well.  No orders of the defined types were placed in this encounter.   DISPOSITION  Follow up: In person appointment in  PRN  Neurosurgery Susanne Borders, PA   TELEPHONE DOCUMENTATION   This visit was performed via telephone.  Patient location: home Provider location: office  I spent a total of 10 minutes non-face-to-face activities for this visit on the date of this encounter including review of current clinical condition and response to treatment including symptoms, risk and benefits of treatment options, documentation, and order placement.  The patient is aware of and accepts the limits of this telehealth visit.

## 2023-03-05 ENCOUNTER — Encounter: Payer: Self-pay | Admitting: Psychiatry

## 2023-03-05 ENCOUNTER — Other Ambulatory Visit: Payer: Self-pay | Admitting: Neurosurgery

## 2023-03-05 ENCOUNTER — Other Ambulatory Visit
Admission: RE | Admit: 2023-03-05 | Discharge: 2023-03-05 | Disposition: A | Discharge status: Home / Self Care | Payer: Medicare PPO | Attending: Psychiatry | Admitting: Psychiatry

## 2023-03-05 ENCOUNTER — Ambulatory Visit (INDEPENDENT_AMBULATORY_CARE_PROVIDER_SITE_OTHER): Payer: Medicare PPO | Admitting: Psychiatry

## 2023-03-05 VITALS — BP 106/74 | HR 80 | Temp 97.5°F | Ht 67.0 in | Wt 203.2 lb

## 2023-03-05 DIAGNOSIS — F411 Generalized anxiety disorder: Secondary | ICD-10-CM | POA: Diagnosis not present

## 2023-03-05 DIAGNOSIS — R5383 Other fatigue: Secondary | ICD-10-CM | POA: Diagnosis present

## 2023-03-05 DIAGNOSIS — M5416 Radiculopathy, lumbar region: Secondary | ICD-10-CM

## 2023-03-05 DIAGNOSIS — G4701 Insomnia due to medical condition: Secondary | ICD-10-CM | POA: Diagnosis not present

## 2023-03-05 DIAGNOSIS — F4310 Post-traumatic stress disorder, unspecified: Secondary | ICD-10-CM | POA: Diagnosis not present

## 2023-03-05 DIAGNOSIS — G894 Chronic pain syndrome: Secondary | ICD-10-CM

## 2023-03-05 DIAGNOSIS — F3175 Bipolar disorder, in partial remission, most recent episode depressed: Secondary | ICD-10-CM | POA: Diagnosis not present

## 2023-03-05 LAB — VITAMIN B12: Vitamin B-12: 177 pg/mL — ABNORMAL LOW (ref 180–914)

## 2023-03-05 MED ORDER — TRAZODONE HCL 100 MG PO TABS
100.0000 mg | ORAL_TABLET | Freq: Every day | ORAL | 0 refills | Status: DC
Start: 2023-03-05 — End: 2024-05-29

## 2023-03-05 MED ORDER — TOPIRAMATE 25 MG PO TABS
75.0000 mg | ORAL_TABLET | Freq: Every day | ORAL | 0 refills | Status: DC
Start: 2023-03-05 — End: 2024-03-22

## 2023-03-05 NOTE — Progress Notes (Signed)
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  Patient today reports she does not believe she is depressed however she feels unmotivated all the time.  She feels as though she has no energy to do anything.  She continues to struggle with a lot of pain.  She reports she is planning to get evaluated for a spinal cord stimulator.  She however is not sure if she wants to go that route.  She continues to be taking medications for pain management and pain is a major factor which affects her day-to-day life.  Patient reports she struggles with her appetite.  She does not believe she is eating enough for herself.  She however is on medications like phentermine for weight loss which is likely triggering it.  She is also on a mood stabilizer Topamax which was started based on patient preference since it helped with weight loss as well which likely also is contributing to it.  Patient reports she is interested in medication readjustment of Topamax today since she continues to feel her dosage is not enough for her mood symptoms.  She would like to try a higher dosage.  She is aware of the side effect of worsening appetite reduction.  She reports she is planning to ask her son who stays with her to help her with her diet plan so she can work out a better eating regimen.  She struggles with a lot of  fatigue.  Patient had vitamin B12 level done a year ago which was down trending.  Patient will go with focus concentration as well as short-term memory problems will benefit from repeat vitamin B12 level as well as replacement if needed.  Patient agreeable to repeat this today.  Patient continues to have sleep problems mostly because of pain.  She does have trazodone which helps.  She also uses CPAP and has a new CPAP mask.  She is compliant.  She denies any suicidality, homicidality or perceptual disturbances.  Patient denies any other concerns today.  Visit Diagnosis:    ICD-10-CM   1. Bipolar disorder, in partial remission, most recent episode depressed (HCC)  F31.75 topiramate (TOPAMAX) 25 MG tablet    traZODone (DESYREL) 100 MG tablet   Type 1    2. GAD (generalized anxiety disorder)  F41.1 topiramate (TOPAMAX) 25 MG tablet    traZODone (DESYREL) 100 MG tablet    3. PTSD (post-traumatic stress disorder)  F43.10 topiramate (TOPAMAX) 25 MG tablet    4. Insomnia due to medical condition  G47.01    Pain, OSA currently compliant on CPAP    5. Fatigue, unspecified type  R53.83 Vitamin B12      Past Psychiatric History: I have reviewed past psychiatric history from progress note on 02/19/2019.  Past trials of medications like risperidone, Invega, Celexa, lithium, Topamax, hydroxyzine, trazodone.  Past Medical History:  Past Medical History:  Diagnosis Date   Abdominal pain    d/t ongoing menstrual bleeding  Anemia    iron deficiency, receiving Feraheme transfusions   Arthritis    Asthma    nonspecific interstitial pneumonia.Marland Kitchendiagnosed 6 years ago   Bipolar 1 disorder (HCC)    Blood in stool    happens often but unsure of etiology.  will have colonoscopy   Change in voice    Chest pain    has often but is due to lung disease   Constipation    Depression    Diverticulitis    DVT (deep venous thrombosis) (HCC) 12/13/2014   Dysrhythmia    Fatigue    Gallstones 04/10/2011    with biliary pancreatitis   GERD (gastroesophageal reflux disease)    History of blood transfusion 02/2019   one unit d/t hgb being low   History of hiatal hernia    small   History of kidney stones    History of methicillin resistant staphylococcus aureus (MRSA)    Hypertension    Idiopathic non-specific interstitial pneumonitis (HCC)    Migraines    MRSA carrier    many years ago.   Nausea    Pancreatitis    Pre-diabetes    PTSD (post-traumatic stress disorder)    Pulmonary embolism (HCC) 12/13/2014   Sleep apnea    does not use cpap now.  will be retested soon   Takes iron supplements    FERAHEME weekly transfusions as well as ferrous sulfate PO   Trouble swallowing    Vocal cord polyps     Past Surgical History:  Procedure Laterality Date   BACK SURGERY     BRAVO PH STUDY  06/02/2011   Procedure: BRAVO PH STUDY;  Surgeon: Louis Meckel, MD;  Location: [facility];  Service: Endoscopy;  Laterality: N/A;   COLONOSCOPY     CYSTOSCOPY  08/15/2019   Procedure: CYSTOSCOPY;  Surgeon: Vena Austria, MD;  Location: [facility];  Service: Gynecology;;   DILATION AND CURETTAGE OF UTERUS     ENDOMETRIAL ABLATION N/A 03/30/2019   Procedure: ENDOMETRIAL ABLATION;  Surgeon: Vena Austria, MD;  Location: [facility];  Service: Gynecology;  Laterality: N/A;   ESOPHAGOGASTRODUODENOSCOPY  06/02/2011   Procedure: ESOPHAGOGASTRODUODENOSCOPY (EGD);  Surgeon: Louis Meckel, MD;  Location: [facility];  Service: Endoscopy;  Laterality: N/A;   GASTRIC BYPASS  2021   HYSTEROSCOPY  03/30/2019   Procedure: HYSTEROSCOPY;  Surgeon: Vena Austria, MD;  Location: [facility];  Service: Gynecology;;   LAPAROSCOPIC CHOLECYSTECTOMY W/ CHOLANGIOGRAPHY  04/25/2011   Dr Jamey Ripa   LUMBAR LAMINECTOMY FOR EPIDURAL ABSCESS N/A 01/30/2022   Procedure: LUMBAR LAMINECTOMY FOR EPIDURAL ABSCESS;  Surgeon: Venetia Night, MD;  Location: [facility];  Service: Neurosurgery;  Laterality: N/A;  Per consent and  verbal confirmation with the surgeon, the procedure performed was wound exploration, evacuation of epidural fluid collection   LUMBAR LAMINECTOMY/DECOMPRESSION MICRODISCECTOMY N/A 01/19/2022   Procedure: L4-S1 DECOMPRESSION;  Surgeon: Venetia Night, MD;  Location: [facility];  Service: Neurosurgery;  Laterality: N/A;   NASAL POLYP SURGERY     polyp from vocal cords   TOTAL LAPAROSCOPIC HYSTERECTOMY WITH SALPINGECTOMY Bilateral 08/15/2019   Procedure: TOTAL LAPAROSCOPIC HYSTERECTOMY WITH SALPINGECTOMY;  Surgeon: Vena Austria, MD;  Location: [facility];  Service: Gynecology;  Laterality: Bilateral;   TUBAL LIGATION  1996   WISDOM TOOTH EXTRACTION      Family Psychiatric History: I have reviewed family psychiatric history from progress note on 02/19/2019.  Family History:  Family History  Problem Relation Age of Onset   Bipolar disorder Mother  Diabetes Mother    Pancreatic cancer Mother    Alcohol abuse Father    Lung cancer Father    Bipolar disorder Sister    Alcohol abuse Paternal Aunt    Cancer Paternal Aunt 0   Alcohol abuse Paternal Uncle    Alcohol abuse Paternal Grandfather    Bipolar disorder Niece    Anesthesia problems Neg Hx    Hypotension Neg Hx    Malignant hyperthermia Neg Hx    Pseudochol deficiency Neg Hx     Social History: I have reviewed social history from progress note on 02/19/2019. Social History   Socioeconomic History   Marital status: Divorced    Spouse name: Not on file   Number of children: 7   Years of education: Not on file   Highest education level: Some college, no degree  Occupational History    Comment: disability  Tobacco Use   Smoking status: Never   Smokeless tobacco: Never  Vaping Use   Vaping status: Never Used  Substance and Sexual Activity   Alcohol use: Not Currently    Comment: rare   Drug use: No   Sexual activity: Yes    Partners: Male    Birth control/protection: Surgical  Other Topics Concern   Not on file   Social History Narrative   In [place]; with 2 sons; used to work in Physicist, medical. Never smoked; ocassional alcohol.    Social Determinants of Health   Financial Resource Strain: Not on file  Food Insecurity: Not on file  Transportation Needs: Not on file  Physical Activity: Not on file  Stress: Not on file  Social Connections: Not on file    Allergies:  Allergies  Allergen Reactions   Lisinopril Anaphylaxis    Facial swelling, difficulty breathing   Morphine And Codeine Other (See Comments)    migraine    Metabolic Disorder Labs: Lab Results  Component Value Date   HGBA1C 5.6 03/18/2014   Lab Results  Component Value Date   PROLACTIN 24.2 (H) 03/03/2019   No results found for: "CHOL", "TRIG", "HDL", "CHOLHDL", "VLDL", "LDLCALC" Lab Results  Component Value Date   TSH 1.888 03/02/2019    Therapeutic Level Labs: No results found for: "LITHIUM" No results found for: "VALPROATE" No results found for: "CBMZ"  Current Medications: Current Outpatient Medications  Medication Sig Dispense Refill   AIMOVIG 140 MG/ML SOAJ Inject 140 mg into the skin every 30 (thirty) days.     albuterol (PROVENTIL HFA;VENTOLIN HFA) 108 (90 Base) MCG/ACT inhaler Inhale 2 puffs into the lungs every 6 (six) hours as needed for wheezing or shortness of breath. 1 Inhaler 2   azaTHIOprine (IMURAN) 50 MG tablet Take 50 mg by mouth every morning.     gabapentin (NEURONTIN) 300 MG capsule TAKE 1 CAPSULE BY MOUTH IN THE MORNING AND AFTERNOON, AND TAKE 2 CAPSULES AT NIGHT 120 capsule 0   hydrOXYzine (ATARAX) 25 MG tablet Take 1 tablet (25 mg total) by mouth 2 (two) times daily as needed for anxiety. 180 tablet 0   Ipratropium-Albuterol (COMBIVENT) 20-100 MCG/ACT AERS respimat Inhale 1 puff into the lungs every morning.     Multiple Vitamin (MULTIVITAMIN) tablet Take 1 tablet by mouth daily.     phentermine 15 MG capsule Take 37.5 mg by mouth every morning.     Rimegepant Sulfate 75 MG TBDP Take by  mouth.     rizatriptan (MAXALT) 10 MG tablet Take by mouth.     SUMAtriptan (IMITREX) 100 MG tablet  Take 100 mg by mouth every 2 (two) hours as needed for migraine.      valACYclovir (VALTREX) 1000 MG tablet Take 1,000 mg by mouth 2 (two) times daily.     warfarin (COUMADIN) 1 MG tablet TAKE 1 TABLET BY MOUTH NIGHTLY ALONG WITH 5MG  TABLET FOR A TOTAL DAILY DOSE OF 6MG      warfarin (COUMADIN) 5 MG tablet TAKE 1 TABLET BY MOUTH NIGHTLY ALONG WITH 1MG  TABLET FOR A TOTAL OF 6MG  DAILY     pantoprazole (PROTONIX) 40 MG tablet Take 1 tablet (40 mg total) by mouth every morning. 30 tablet 11   topiramate (TOPAMAX) 25 MG tablet Take 3 tablets (75 mg total) by mouth daily. 270 tablet 0   traZODone (DESYREL) 100 MG tablet Take 1 tablet (100 mg total) by mouth at bedtime. 90 tablet 0   No current facility-administered medications for this visit.     Musculoskeletal: Strength & Muscle Tone: within normal limits Gait & Station: normal Patient leans: N/A  Psychiatric Specialty Exam: Review of Systems  Constitutional:  Positive for fatigue.  Psychiatric/Behavioral:  Positive for decreased concentration and sleep disturbance.        Lack of motivation    Blood pressure 106/74, pulse 80, temperature (!) 97.5 F (36.4 C), temperature source Skin, height 5\' 7"  (1.702 m), weight 203 lb 3.2 oz (92.2 kg), last menstrual period 07/17/2019.Body mass index is 31.83 kg/m.  General Appearance: Fairly Groomed  Eye Contact:  Fair  Speech:  Clear and Coherent  Volume:  Normal  Mood:   reports she feels ' blah ',not depressed though  Affect:  Appropriate  Thought Process:  Goal Directed and Descriptions of Associations: Intact  Orientation:  Full (Time, Place, and Person)  Thought Content: Logical   Suicidal Thoughts:  No  Homicidal Thoughts:  No  Memory:  Immediate;   Fair Recent;   Fair Remote;   Fair does have short-term memory problems  Judgement:  Fair  Insight:  Fair  Psychomotor Activity:  Normal   Concentration:  Concentration: Fair and Attention Span: Fair  Recall:  [employer] of Knowledge: Fair  Language: Fair  Akathisia:  No  Handed:  Right  AIMS (if indicated): not done  Assets:  Manufacturing systems engineer Desire for Improvement Housing Social Support Transportation  ADL's:  Intact  Cognition: WNL  Sleep:   Improving   Screenings: Geneticist, molecular Office Visit from 08/26/2022 in [facility] Office Visit from 05/14/2022 in [facility]  AIMS Total Score 0 0      AUDIT    Flowsheet Row [facility]  Alcohol Use Disorder Identification Test Final Score (AUDIT) 0      GAD-7    Flowsheet Row Office Visit from 12/03/2022 in [facility] Office Visit from 10/21/2022 in [facility] Office Visit from 08/26/2022 in [facility] Office Visit from 05/14/2022 in [facility] Office Visit from 04/02/2022 in [facility]  Total GAD-7 Score 9 6 8 20 19       PHQ2-9    Flowsheet Row Office Visit from 12/03/2022 in [facility] Office Visit from 10/21/2022 in [facility] Office Visit from 08/26/2022 in [facility] Office Visit from 05/14/2022 in [facility] Office Visit from 04/02/2022 in [facility]  PHQ-2 Total Score 2 2 2 6 6   PHQ-9 Total Score 9 12 12 21 19       Flowsheet Row Office Visit from 03/05/2023 in [facility] Office Visit from 12/03/2022 in [facility] Office Visit from 10/21/2022 in [facility]  C-SSRS RISK CATEGORY No Risk No Risk No Risk        Assessment and Plan: Lisa [name] is a 52 year old African-American female on disability, has a history of bipolar disorder, GAD, PTSD, currently with chronic back pain, fatigue, appetite change currently on medications like Topamax, phentermine, previous history of vitamin B12 deficiency, will benefit from the following medication changes, labs, plan as noted below.  Plan Bipolar disorder type I most recent episode depressed in partial remission Increase Topamax to 75 mg p.o. daily Provided education about appetite changes on Topamax.  Patient to monitor and agrees to come up with a diet plan so she is aware that she needs to have scheduled meals. Also on gabapentin which is for pain although mood stabilizer.  GAD-improving Continue hydroxyzine 25 mg p.o. twice daily as needed Patient on gabapentin for pain which is also a mood stabilizer and helps with anxiety. Patient to consider CBT.  PTSD-chronic-stable Trazodone 100 mg p.o. nightly Referred for CBT  Insomnia-improving Sleep problems likely multifactorial including due to pain Continue CPAP for OSA. Trazodone 100 mg p.o. nightly  Fatigue-unspecified Will order vitamin B12 level, patient with vitamin B12 level a year ago 04/30/2022-204-low.  Patient may benefit from replacement if it continues to be low which likely could be contributing to fatigue as well as other symptoms including memory and concentration problems. Patient to [facility] lab.  Patient provided psychoeducation regarding the need for a diet plan, scheduling her meals.  She also reports she is going to come off of the phentermine soon and that might also help with appetite.  Will continue to explore this in future sessions.  Collaboration of Care: Collaboration of Care: Referral or follow-up with  counselor/therapist AEB encouraged to establish care with therapist.  Patient/Guardian was advised Release of Information must be obtained prior to any record release in order to collaborate their care with an outside provider. Patient/Guardian was advised if they have not already done so to contact the registration department to sign all necessary forms in order for [place] to release information regarding their care.   Consent: Patient/Guardian gives verbal consent for treatment and assignment of benefits for services provided during this visit. Patient/Guardian expressed understanding and agreed to proceed.  Follow-up in clinic in 2 months or sooner if needed.  This note was generated in part or whole with voice recognition software. Voice recognition is usually quite accurate but there are transcription errors that can and very often do occur. I apologize for any typographical errors that were not detected and corrected.     Jomarie Longs, MD 03/05/2023, 12:18 PM

## 2023-03-08 ENCOUNTER — Encounter: Payer: Self-pay | Admitting: Student

## 2023-03-12 ENCOUNTER — Ambulatory Visit
Admission: RE | Admit: 2023-03-12 | Discharge: 2023-03-12 | Disposition: A | Discharge status: Home / Self Care | Payer: Medicare PPO | Source: Ambulatory Visit | Attending: Student | Admitting: Student

## 2023-03-12 DIAGNOSIS — G43109 Migraine with aura, not intractable, without status migrainosus: Secondary | ICD-10-CM

## 2023-03-12 DIAGNOSIS — R202 Paresthesia of skin: Secondary | ICD-10-CM

## 2023-03-13 ENCOUNTER — Other Ambulatory Visit: Payer: Self-pay | Admitting: Psychiatry

## 2023-03-13 DIAGNOSIS — F411 Generalized anxiety disorder: Secondary | ICD-10-CM

## 2023-03-13 DIAGNOSIS — F3175 Bipolar disorder, in partial remission, most recent episode depressed: Secondary | ICD-10-CM

## 2023-03-13 DIAGNOSIS — F4310 Post-traumatic stress disorder, unspecified: Secondary | ICD-10-CM

## 2023-03-16 ENCOUNTER — Telehealth: Payer: Self-pay | Admitting: Psychiatry

## 2023-03-16 NOTE — Telephone Encounter (Signed)
Vitamin b12 level is low. Patient will need replacement.

## 2023-03-17 NOTE — Telephone Encounter (Signed)
pt states that she already started on the b12 injections

## 2023-03-17 NOTE — Telephone Encounter (Signed)
Noted  

## 2023-04-15 ENCOUNTER — Ambulatory Visit
Payer: Medicare PPO | Attending: Student in an Organized Health Care Education/Training Program | Admitting: Student in an Organized Health Care Education/Training Program

## 2023-04-15 ENCOUNTER — Ambulatory Visit
Admission: RE | Admit: 2023-04-15 | Discharge: 2023-04-15 | Disposition: A | Discharge status: Home / Self Care | Payer: Medicare PPO | Source: Ambulatory Visit | Attending: Student in an Organized Health Care Education/Training Program | Admitting: Student in an Organized Health Care Education/Training Program

## 2023-04-15 ENCOUNTER — Encounter: Payer: Self-pay | Admitting: Student in an Organized Health Care Education/Training Program

## 2023-04-15 VITALS — BP 103/62 | HR 78 | Temp 97.2°F | Resp 16 | Ht 68.0 in | Wt 200.8 lb

## 2023-04-15 DIAGNOSIS — M961 Postlaminectomy syndrome, not elsewhere classified: Secondary | ICD-10-CM | POA: Diagnosis present

## 2023-04-15 DIAGNOSIS — G8929 Other chronic pain: Secondary | ICD-10-CM | POA: Diagnosis not present

## 2023-04-15 DIAGNOSIS — M5416 Radiculopathy, lumbar region: Secondary | ICD-10-CM | POA: Insufficient documentation

## 2023-04-15 NOTE — Progress Notes (Signed)
Safety precautions to be maintained throughout the outpatient stay will include: orient to surroundings, keep bed in low position, maintain call bell within reach at all times, provide assistance with transfer out of bed and ambulation.  

## 2023-04-15 NOTE — Progress Notes (Signed)
Patient: Lisa [name]  Service Category: E/M  Provider: Edward Jolly, MD  DOB: August 25, 1970  DOS: 04/15/2023  Referring Provider: Susanne Borders, PA  MRN: [id number]  Setting: Ambulatory outpatient  PCP: [facility]  Type: New Patient  Specialty: Interventional Pain Management    Location: Office  Delivery: Face-to-face     Primary Reason(s) for Visit: Encounter for initial evaluation of one or more chronic problems (new to examiner) potentially causing chronic pain, and posing a threat to normal musculoskeletal function. (Level of risk: High) CC: Back Pain  HPI  Ms. [name] is a 52 y.o. year old, female patient, who comes for the first time to our practice referred by Susanne Borders, PA for our initial evaluation of her chronic pain. She has Abdominal pain, generalized; Esophageal reflux; Schizoaffective disorder (HCC); PTSD (post-traumatic stress disorder); Bipolar I disorder, most recent episode depressed (HCC); High BMI; Dysmenorrhea; Abdominal bloating; Endometrial polyp; DVT (deep venous thrombosis) (HCC); Pulmonary embolism (HCC); Nonspecific interstitial pneumonia (HCC); Cough, persistent; Obstructive apnea; Asthma without status asthmaticus; Depression; Essential hypertension; Obesity, Class III, BMI 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); Symptomatic anemia; Acute deep vein thrombosis (DVT) of popliteal vein of right lower extremity (HCC); Iron deficiency anemia due to chronic blood loss; Chronic constipation; Menorrhagia; Migraine; Prediabetes; Vocal cord polyps; S/P hysterectomy; S/P laparoscopic hysterectomy; Chronic radicular lumbar pain; Cauda equina compression (HCC); S/P gastric bypass; Bipolar 1 disorder, depressed, moderate (HCC); GAD (generalized anxiety disorder); 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 Today she comes  in for evaluation of her Back Pain  Pain Assessment: Location: Lower Back Radiating: up to shoulders bilat, fingers are numb and tingle at times; down back of right leg to bottom of foot and down back of left thing Onset: More than a month ago Duration: Chronic pain Quality: Numbness, Tingling, Aching, Burning, Constant, Sharp, Shooting, Radiating, Throbbing Severity: 6 /10 (subjective, self-reported pain score)  Effect on ADL: limits daily activities Timing: Constant Modifying factors: reclining, changing positions BP: 103/62  HR: 78  Onset and Duration: Present longer than 3 months Cause of pain: Unknown Severity: NAS-11 at its worse: 8/10 and NAS-11 at its best: 4/10 Timing: Not influenced by the time of the day, During activity or exercise, After activity or exercise, and After a period of immobility Aggravating Factors: Bending, Lifiting, Motion, Surgery made it worse, Twisting, and Walking Alleviating Factors: Lying down and Resting Associated Problems: Day-time cramps, Night-time cramps, Fatigue, Inability to concentrate, Numbness, Spasms, Tingling, Weakness, Pain that wakes patient up, and Pain that does not allow patient to sleep Quality of Pain: Aching, Annoying, Burning, Constant, Deep, Disabling, Distressing, Dull, Nagging, Pressure-like, Sharp, Shooting, Stabbing, Tingling, and Uncomfortable Previous Examinations or Tests: CT scan, MRI scan, Nerve conduction test, and Psychiatric evaluation Previous Treatments: Physical Therapy, Pool exercises, Strengthening exercises, Stretching exercises, TENS, and Trigger point injections  Ms. [name] is being evaluated for possible interventional pain management therapies for the treatment of her chronic pain.   Bjorn Loser is a pleasant 52 year old female status post 2 lumbar spine surgeries with a lumbar laminectomy who endorses chronic and severe low back pain with radiation down her right posterior lateral leg to the bottom of her  foot and down her left leg to her left calf.  She has attempted medication management, epidural steroid injections with physiatry as well as physical therapy with limited response.  She was subsequently evaluated by neurosurgery and a spinal  cord stimulator was recommended for her.  When asking about her symptoms today, she states that her low back pain is more bothersome for her than her right leg pain.  She is somewhat reluctant about spinal cord stimulation and wants to learn more about it.  Ms. [name] has been informed that this initial visit was an evaluation only.  On the follow up appointment I will go over the results, including ordered tests and available interventional therapies. At that time she will have the opportunity to decide whether to proceed with offered therapies or not. In the event that Ms. [name] prefers avoiding interventional options, this will conclude our involvement in the case.  Medication management recommendations may be provided upon request.  Patient informed that diagnostic tests may be ordered to assist in identifying underlying causes, narrow the list of differential diagnoses and aid in determining candidacy for (or contraindications to) planned therapeutic interventions.  Meds   Current Outpatient Medications:    acetaminophen (TYLENOL) 500 MG tablet, Take 500 mg by mouth every 6 (six) hours as needed., Disp: , Rfl:    AIMOVIG 140 MG/ML SOAJ, Inject 140 mg into the skin every 30 (thirty) days., Disp: , Rfl:    albuterol (PROVENTIL HFA;VENTOLIN HFA) 108 (90 Base) MCG/ACT inhaler, Inhale 2 puffs into the lungs every 6 (six) hours as needed for wheezing or shortness of breath., Disp: 1 Inhaler, Rfl: 2   gabapentin (NEURONTIN) 300 MG capsule, TAKE 1 CAPSULE BY MOUTH IN THE MORNING AND AFTERNOON, AND TAKE 2 CAPSULES AT NIGHT, Disp: 120 capsule, Rfl: 0   hydrOXYzine (ATARAX) 25 MG tablet, Take 1 tablet (25 mg total) by mouth 2 (two) times daily as needed  for anxiety., Disp: 180 tablet, Rfl: 0   Ipratropium-Albuterol (COMBIVENT) 20-100 MCG/ACT AERS respimat, Inhale 1 puff into the lungs every morning., Disp: , Rfl:    Multiple Vitamin (MULTIVITAMIN) tablet, Take 1 tablet by mouth daily., Disp: , Rfl:    pantoprazole (PROTONIX) 40 MG tablet, Take 1 tablet (40 mg total) by mouth every morning., Disp: 30 tablet, Rfl: 11   phentermine 15 MG capsule, Take 37.5 mg by mouth every morning., Disp: , Rfl:    Rimegepant Sulfate 75 MG TBDP, Take by mouth., Disp: , Rfl:    rizatriptan (MAXALT) 10 MG tablet, Take by mouth., Disp: , Rfl:    SUMAtriptan (IMITREX) 100 MG tablet, Take 100 mg by mouth every 2 (two) hours as needed for migraine. , Disp: , Rfl:    topiramate (TOPAMAX) 25 MG tablet, Take 3 tablets (75 mg total) by mouth daily., Disp: 270 tablet, Rfl: 0   traZODone (DESYREL) 100 MG tablet, Take 1 tablet (100 mg total) by mouth at bedtime., Disp: 90 tablet, Rfl: 0   valACYclovir (VALTREX) 1000 MG tablet, Take 1,000 mg by mouth 2 (two) times daily., Disp: , Rfl:    azaTHIOprine (IMURAN) 50 MG tablet, Take 50 mg by mouth every morning. (Patient not taking: Reported on 04/15/2023), Disp: , Rfl:    warfarin (COUMADIN) 1 MG tablet, TAKE 1 TABLET BY MOUTH NIGHTLY ALONG WITH 5MG  TABLET FOR A TOTAL DAILY DOSE OF 6MG  (Patient not taking: Reported on 04/15/2023), Disp: , Rfl:    warfarin (COUMADIN) 5 MG tablet, TAKE 1 TABLET BY MOUTH NIGHTLY ALONG WITH 1MG  TABLET FOR A TOTAL OF 6MG  DAILY (Patient not taking: Reported on 04/15/2023), Disp: , Rfl:   Imaging Review  Cervical Imaging: Cervical MR wo contrast: Results for orders placed during the hospital encounter of 03/12/23  MR CERVICAL SPINE WO CONTRAST  Narrative CLINICAL DATA:  Bilateral upper extremity radiculopathy, right greater than left. Numbness in the hands. Status post fall 8 months ago.  EXAM: MRI CERVICAL SPINE WITHOUT CONTRAST  TECHNIQUE: Multiplanar, multisequence MR imaging of the cervical  spine was performed. No intravenous contrast was administered.  COMPARISON:  Cervical spine radiographs 11/11/2021  FINDINGS: Alignment: No significant listhesis is present. Straightening of the normal cervical lordosis is present.  Vertebrae: Marrow signal and vertebral body heights are normal. No focal osseous lesions are present.  Cord: Normal signal and morphology.  Posterior Fossa, vertebral arteries, paraspinal tissues: Craniocervical junction is normal. Flow is present in the vertebral arteries bilaterally. Visualized intracranial contents are normal.  Disc levels:  C2-3: Mild facet hypertrophy is present on the left. No significant stenosis is present.  C3-4: A right paramedian soft disc protrusion contacts the ventral surface of the cord. The foramina are patent bilaterally.  C4-5: A shallow central soft disc protrusion and annular tear contacts the ventral surface the cord. Uncovertebral spurring contributes to mild foraminal narrowing, left greater than right.  C5-6: A mild broad-based disc osteophyte complex is present. Mild left foraminal stenosis is present.  C6-7: Mild left uncovertebral and facet hypertrophy is present without significant stenosis.  C7-T1: Mild left facet hypertrophy and uncovertebral spurring is present without significant stenosis.  IMPRESSION: 1. Mild multilevel spondylosis of the cervical spine as described. 2. Mild to moderate central canal stenosis secondary to soft disc protrusions at C4-5 and C5-6 as described. There is slight distortion of the ventral surface the cord at both levels without abnormal cord signal. 3. Mild foraminal narrowing bilaterally at C4-5 is worse on the left. 4. Mild left foraminal narrowing at C5-6. 5. Mild left facet hypertrophy and uncovertebral spurring at C6-7 and C7-T1 without significant stenosis.   Electronically Signed By: Marin Roberts M.D. On: 03/22/2023  09:47   Narrative CLINICAL DATA:  Cervical neck pain.  EXAM: CERVICAL SPINE - 2-3 VIEW; CERVICAL SPINE - FLEXION AND EXTENSION VIEWS ONLY  COMPARISON:  None Available.  FINDINGS: There is no evidence of cervical spine fracture or prevertebral soft tissue swelling. Alignment is straightened in the neutral position but otherwise normal including in flexion and extension. No other significant bone abnormalities are identified.  There is preservation of the normal vertebral and disc heights. Arthritic changes are not seen. Small anterior endplate spurs are beginning to develop at C4-5 and C5-6 without further spondylosis.  IMPRESSION: Early spondylosis. No evidence of fractures or listhesis. Straightened lordosis in neutral.   Electronically Signed By: Almira Bar M.D. On: 11/12/2021 02:06  Cervical DG F/E views: Results for orders placed during the hospital encounter of 11/11/21  DG Cerv Spine Flex&Ext Only  Narrative CLINICAL DATA:  Cervical neck pain.  EXAM: CERVICAL SPINE - 2-3 VIEW; CERVICAL SPINE - FLEXION AND EXTENSION VIEWS ONLY  COMPARISON:  None Available.  FINDINGS: There is no evidence of cervical spine fracture or prevertebral soft tissue swelling. Alignment is straightened in the neutral position but otherwise normal including in flexion and extension. No other significant bone abnormalities are identified.  There is preservation of the normal vertebral and disc heights. Arthritic changes are not seen. Small anterior endplate spurs are beginning to develop at C4-5 and C5-6 without further spondylosis.  IMPRESSION: Early spondylosis. No evidence of fractures or listhesis. Straightened lordosis in neutral.   Electronically Signed By: Almira Bar M.D. On: 11/12/2021 02:06   MR THORACIC SPINE WO CONTRAST  Narrative  CLINICAL DATA:  Mid back pain  EXAM: MRI THORACIC SPINE WITHOUT CONTRAST  TECHNIQUE: Multiplanar, multisequence MR  imaging of the thoracic spine was performed. No intravenous contrast was administered.  COMPARISON:  CT 03/13/2019  FINDINGS: Alignment:  Physiologic.  Vertebrae: No fracture, evidence of discitis, or bone lesion.  Cord:  Normal signal and morphology.  Paraspinal and other soft tissues: Negative.  Disc levels:  Shallow left paracentral noncompressive disc protrusion at T7-T8. No additional significant disc protrusion. No advanced facet joint arthropathy. No evidence of significant foraminal or canal stenosis at any level.  IMPRESSION: 1. No acute abnormality of the thoracic spine. 2. Shallow left paracentral noncompressive disc protrusion at T7-T8. No evidence of significant foraminal or canal stenosis at any level.   Electronically Signed By: Duanne Guess D.O. On: 10/14/2022 16:54    Narrative CLINICAL DATA:  Midline thoracic back pain, chronic.  EXAM: THORACIC SPINE 2 VIEWS  COMPARISON:  Chest CT without contrast and reconstructions dated 12/28/2019.  FINDINGS: There is no evidence of thoracic spine fracture. Alignment is normal. There is preservation of the normal vertebral and disc heights and normal bone mineralization. There is mild thoracic spondylosis. Arthritic changes are not seen.  There are old cholecystectomy clips right upper abdomen. Interval new surgical changes left upper abdomen suggesting a gastric bypass.  IMPRESSION: Thoracic spondylosis. No evidence of fracture, malalignment or arthritic changes.   Electronically Signed By: Almira Bar M.D. On: 11/12/2021 01:58   MR LUMBAR SPINE WO CONTRAST  Narrative CLINICAL DATA:  Provided history: Lumbar radiculopathy. Lumbar radiculopathy, prior surgery, new symptoms. Additional history provided by the scanning technologist: The patient reports suffering a fall 1 month ago, pain radiating into right leg with numbness.  EXAM: MRI LUMBAR SPINE WITHOUT  CONTRAST  TECHNIQUE: Multiplanar, multisequence MR imaging of the lumbar spine was performed. No intravenous contrast was administered.  COMPARISON:  Lumbar spine MRI 01/30/2022. Lumbar spine radiographs 01/30/2022.  FINDINGS: Segmentation: Five lumbar vertebrae. The caudal-most well-formed intervertebral disc space is designated L5-S1.  Alignment:  Slight L4-L5 grade 1 anterolisthesis.  Vertebrae: Vertebral body height is maintained. Edema within the posterior elements bilaterally at L4-L5, likely degenerative and related to facet arthrosis. Elsewhere, no significant marrow edema or focal suspicious osseous lesion is identified.  Conus medullaris and cauda equina: Conus extends to the L1-L2 level. No signal abnormality identified within the visualized distal spinal cord.  Paraspinal and other soft tissues: No acute findings within included portions of the abdomen/retroperitoneum. No paraspinal mass or collection.  Disc levels:  Unless otherwise stated, the level by level findings below have not significantly changed from the prior MRI of 01/30/2022.  Mild multilevel disc degeneration, greatest at L5-S1.  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  T12-L1: Mild facet arthrosis and ligamentum flavum hypertrophy. No significant disc herniation or stenosis.  L1-L2: Small left foraminal disc protrusion. Moderate facet arthrosis with ligamentum flavum hypertrophy, progressed. Mild relative left subarticular narrowing, new from the prior MRI, and without nerve root impingement at this site. No significant central canal or foraminal stenosis.  L2-L3: Disc bulge. Moderate facet arthrosis with ligamentum flavum hypertrophy. Mild left subarticular narrowing (without nerve root impingement). Mild relative narrowing of the central canal. Mild bilateral neural foraminal narrowing.  L3-L4: Disc bulge. Moderate facet arthrosis with ligamentum  flavum hypertrophy. Mild bilateral subarticular narrowing (without nerve root impingement). Mild narrowing of the central canal. Mild right neural foraminal narrowing.  L4-L5: Slight grade 1 anterolisthesis. Post laminectomy changes on the right.  Slight disc uncovering with disc bulge. Advanced facet arthrosis, progressed. Ligamentum flavum hypertrophy. A previously demonstrated fluid collection at the laminectomy site is no longer present. Central canal stenosis has improved, now moderate to moderately severe (previously severe). Persistent bilateral subarticular stenosis with potential to affect either descending L5 nerve root. Mild-to-moderate bilateral neural foraminal narrowing, unchanged.  L5-S1: Post laminectomy changes on the right. Disc bulge. Superimposed broad-based central/left subarticular disc protrusion (at site of posterior annular fissure). Previously demonstrated fluid collections within the subdural space and at the laminectomy site are no longer present. Moderate facet arthrosis. Although improved from the prior examination, the disc protrusion contributes to persistent moderate left subarticular stenosis with potential to affect the descending left S1 nerve root. Mild central canal stenosis, improved (previously severe). Mild-to-moderate left neural foraminal narrowing, unchanged.  IMPRESSION: 1. Comparison is made to the prior lumbar spine MRI of 01/30/2022. 2. Lumbar spondylosis and postoperative changes, as outlined and with findings most notably as follows. 3. At L5-S1, post laminectomy changes are again noted on the right. Previously demonstrated fluid collections within the subdural space and at the laminectomy site are no longer present. Although improved from the prior exam, a disc protrusion contributes to moderate left subarticular stenosis with potential to affect the descending left S1 nerve root. Mild central canal stenosis has also  improved (previously severe). Mild-to-moderate left neural foraminal narrowing, unchanged. 4. At L4-L5, post laminectomy changes are again noted on the right. A previously demonstrated fluid collection at the laminectomy site is no longer present. Moderate to moderately severe central canal stenosis has improved (previously severe). Persistent bilateral subarticular stenosis with potential to affect either descending L5 nerve root. Mild-to-moderate bilateral neural foraminal narrowing, unchanged. Advanced facet arthrosis at this level with posterior element marrow edema, small right facet joint effusion and slight grade 1 anterolisthesis. 5. No more than mild spinal canal or neural foraminal narrowing at the remaining   Electronically Signed By: Jackey Loge D.O. On: 08/12/2022 08:49   Narrative CLINICAL DATA:  Provided history: Myelopathy, acute, lumbar spine.  EXAM: MRI LUMBAR SPINE WITHOUT AND WITH CONTRAST  TECHNIQUE: Multiplanar and multiecho pulse sequences of the lumbar spine were obtained without and with intravenous contrast.  CONTRAST:  9mL GADAVIST GADOBUTROL 1 MMOL/ML IV SOLN  COMPARISON:  Lumbar spine radiographs 01/30/2022 and earlier. Lumbar spine MRI 11/27/2021.  FINDINGS: Segmentation: 5 lumbar vertebrae. The caudal most well-formed intervertebral disc space is designated L5-S1.  Alignment:  Slight L4-L5 grade 1 anterolisthesis.  Vertebrae: Vertebral body height is maintained. Minimal degenerative endplate edema at J1-B1, L3-L4 and L4-L5.  Conus medullaris and cauda equina: Conus extends to the L1-L2 level. No signal abnormality identified within the visualized distal spinal cord. No appreciable abnormal enhancement of the visualized distal spinal cord or cauda equina nerve roots.  Paraspinal and other soft tissues: Distended urinary bladder, incompletely imaged. Postsurgical changes to the dorsal paraspinal soft tissues at L4-L5 and L5-S1.  Disc  levels:  Unless otherwise stated, the level by level findings below have not significantly changed from the prior MRI of 11/27/2021.  Mild multilevel disc degeneration, greatest at L5-S1.  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  T12-L1: Mild facet arthrosis. No significant disc herniation or stenosis.  L1-L2: Small left foraminal disc protrusion. Mild facet arthrosis. No significant spinal canal stenosis or neural foraminal narrowing.  L2-L3: Disc bulge. Moderate facet arthrosis with ligamentum flavum hypertrophy. Mild relative left subarticular narrowing (without appreciable nerve root impingement). Mild narrowing of the central canal.  Mild bilateral neural foraminal narrowing.  L3-L4: Disc bulge. Moderate facet arthrosis with ligamentum flavum hypertrophy. Mild to moderate bilateral subarticular stenosis. Moderate central canal stenosis. Mild right neural foraminal narrowing.  L4-L5: Slight grade 1 anterolisthesis. Post laminectomy changes, new from the prior MRI. Disc bulge. Advanced facet arthrosis. Small bilateral facet joint effusions. Fluid collection at the laminectomy site, measuring 4.1 x 1.1 cm in transaxial dimensions (for instance as seen on series 11, image 28). Persistent severe central canal and bilateral subarticular stenosis. Moderate bilateral neural foraminal narrowing, unchanged.  L5-S1: Post laminectomy changes, new from the prior MRI. Disc bulge. Superimposed broad-based left center/subarticular disc protrusion at site of posterior annular fissure. Facet arthrosis (mild right, moderate left). Apparent non-enhancing subdural fluid collection at the disc level and tracking superiorly within the posterior aspect of the spinal canal at the L5 vertebral to the upper L5 vertebral body level (is as seen on series 8, images 34 and 35) (series 10, image 9). A fluid collection is also present at the laminectomy site, measuring 2.3 x 1.9 cm  in transaxial dimensions (for instance as seen on series 11, image 33). These factors contribute to severe central canal and bilateral subarticular stenosis at the disc level, progressed from the prior examination. Additionally, there is apparent severe effacement of the thecal sac at the L5 vertebral body level (series 5, image 9). Mild left neural foraminal narrowing, unchanged.  IMPRESSION: Comparison is made to the prior lumbar spine MRI of 11/27/2021.  Lumbar spondylosis, postoperative changes and fluid collections, as detailed and most notably as follows.  At L5-S1, post laminectomy changes are new from the prior MRI. Disc bulge. Superimposed broad-based left center/subarticular disc protrusion (at site of posterior annular fissure). Facet arthrosis (mild right, moderate left). Apparent non-enhancing subdural fluid collection at the disc level and extending more superiorly within the posterior aspect of the spinal canal to the level of the upper L5 vertebral body. A fluid collection is also present at the laminectomy site, measuring 2.3 x 1.9 cm in transaxial dimensions. These factors contribute to severe central canal and bilateral subarticular stenosis at the disc level, progressed from the prior MRI. Additionally, there is apparent severe effacement of the thecal sac at the L5 vertebral body level due to the subdural fluid collection. Mild left neural foraminal narrowing is unchanged.  At L4-L5, post laminectomy changes are new from the prior MRI. Slight grade 1 anterolisthesis. Disc bulge. Advanced facet arthrosis. Small bilateral facet joint effusions. Fluid collection at the laminectomy site, measuring 4.1 x 1.1 cm in transaxial dimensions. These factors contribute to persistent severe central canal and bilateral subarticular stenosis. Moderate bilateral neural foraminal narrowing is unchanged.  At L3-L4, there is multifactorial mild to moderate bilateral subarticular  stenosis, moderate central canal stenosis and mild right neural foraminal narrowing. Findings at this level are unchanged.   Electronically Signed By: Jackey Loge D.O. On: 01/30/2022 17:32    Narrative CLINICAL DATA:  post op, increased pain  EXAM: LUMBAR SPINE - 2-3 VIEW  COMPARISON:  Lumbar radiographs April 27 23.  FINDINGS: Vertebral body heights are maintained and similar to prior. Similar mild grade 1 anterolisthesis of L4 on L5. Lower lumbar facet arthropathy. Cholecystectomy clips.  IMPRESSION: No obvious acute abnormality radiographically. Cross-sectional imaging could provide more sensitive evaluation for postoperative complication if clinically warranted.   Electronically Signed By: Feliberto Harts M.D. On: 01/30/2022 14:23  Lumbar DG (Complete) 4+V: Results for orders placed during the hospital encounter of 11/06/21  DG Lumbar  Spine Complete  Narrative CLINICAL DATA:  Lumbar pain with radiculopathy to right leg.  EXAM: LUMBAR SPINE - COMPLETE 4+ VIEW  COMPARISON:  CT scan of May 15, 2011.  FINDINGS: There is no evidence of lumbar spine fracture. Alignment is normal. Intervertebral disc spaces are maintained. Minimal anterior osteophyte formation is noted at L2-3, L3-4 and L4-5. Hypertrophy and degenerative changes seen involving the right-sided posterior facet joints of L4-5 and L5-S1.  IMPRESSION: Multilevel degenerative changes as described above. No acute abnormality is noted.   Electronically Signed By: Lupita Raider M.D. On: 11/07/2021 08:28         Narrative CLINICAL DATA:  Pain while standing  EXAM: RIGHT KNEE - COMPLETE 4+ VIEW  COMPARISON:  None.  FINDINGS: There is no evidence of fracture, dislocation, or joint effusion. There is no evidence of arthropathy or other focal bone abnormality. Soft tissues are unremarkable.  IMPRESSION: Negative.   Electronically Signed By: Elige Ko On: 07/11/2013  19:28   Narrative *RADIOLOGY REPORT*  Clinical Data: Pain and swelling for 4 days.  No injury.  Skin rupture over the olecranon process with drainage.  RIGHT ELBOW - COMPLETE 3+ VIEW  Comparison: None.  Findings: No evidence of acute fracture or subluxation.  No focal bone lesion or bone destruction.  No bone erosion or bone sclerosis.  Mild hypertrophic degenerative changes.  Bone cortex and trabecular architecture appear intact.  No abnormal periosteal reaction.  No radiopaque soft tissue foreign bodies or soft tissue gas collections.  IMPRESSION: Mild degenerative changes.  No acute bony abnormalities.  Original Report Authenticated By: Marlon Pel, M.D.    Complexity Note: Imaging results reviewed.                         ROS  Cardiovascular: Blood thinners:  Anticoagulant Pulmonary or Respiratory: Lung problems, Wheezing and difficulty taking a deep full breath (Asthma), and Temporary stoppage of breathing during sleep Neurological: Nursing Pain Medication Assessment:  Safety precautions to be maintained throughout the outpatient stay will include: orient to surroundings, keep bed in low position, maintain call bell within reach at all times, provide assistance with transfer out of bed and ambulation.  Medication Inspection Compliance: Ms. Kalla did not comply with our request to bring her pills to be counted. She was reminded that bringing the medication bottles, even when empty, is a requirement.  Medication: None brought in. Pill/Patch Count: None available to be counted. Bottle Appearance: No container available. Did not bring bottle(s) to appointment. Filled Date: N/A Last Medication intake:  Today Psychological-Psychiatric: Psychiatric disorder, Anxiousness, and Depressed Gastrointestinal: Heartburn due to stomach pushing into lungs (Hiatal hernia) and Reflux or heatburn Genitourinary: No reported renal or genitourinary signs or symptoms such as  difficulty voiding or producing urine, peeing blood, non-functioning kidney, kidney stones, difficulty emptying the bladder, difficulty controlling the flow of urine, or chronic kidney disease Hematological: No reported hematological signs or symptoms such as prolonged bleeding, low or poor functioning platelets, bruising or bleeding easily, hereditary bleeding problems, low energy levels due to low hemoglobin or being anemic Endocrine: No reported endocrine signs or symptoms such as high or low blood sugar, rapid heart rate due to high thyroid levels, obesity or weight gain due to slow thyroid or thyroid disease Rheumatologic: No reported rheumatological signs and symptoms such as fatigue, joint pain, tenderness, swelling, redness, heat, stiffness, decreased range of motion, with or without associated rash Musculoskeletal: Negative for myasthenia gravis, muscular  dystrophy, multiple sclerosis or malignant hyperthermia Work History: Disabled  Allergies  Ms. [name] is allergic to lisinopril and morphine and codeine.  Laboratory Chemistry Profile   Renal Lab Results  Component Value Date   BUN 14 01/30/2022   CREATININE 0.59 01/31/2022   GFRAA >60 08/16/2019   GFRNONAA >60 01/31/2022   PROTEINUR NEGATIVE 02/03/2022     Electrolytes Lab Results  Component Value Date   NA 135 01/30/2022   K 3.8 01/30/2022   CL 106 01/30/2022   CALCIUM 8.5 (L) 01/30/2022   MG 2.2 05/31/2019     Hepatic Lab Results  Component Value Date   AST 14 (L) 04/26/2019   ALT 14 04/26/2019   ALBUMIN 3.7 04/26/2019   ALKPHOS 36 (L) 04/26/2019   AMYLASE 97 04/24/2011   LIPASE 47 02/27/2017     ID Lab Results  Component Value Date   HIV NON REACTIVE 08/25/2013   SARSCOV2NAA POSITIVE (A) 05/31/2019   STAPHAUREUS NEGATIVE 01/07/2022   MRSAPCR NEGATIVE 01/07/2022   PREGTESTUR NEGATIVE 08/15/2019     Bone Lab Results  Component Value Date   TESTOFREE 1.1 03/03/2019   TESTOSTERONE 13 03/03/2019      Endocrine Lab Results  Component Value Date   GLUCOSE 148 (H) 01/30/2022   GLUCOSEU 150 (A) 02/03/2022   HGBA1C 5.6 03/18/2014   TSH 1.888 03/02/2019   TESTOFREE 1.1 03/03/2019   TESTOSTERONE 13 03/03/2019     Neuropathy Lab Results  Component Value Date   VITAMINB12 177 (L) 03/05/2023   FOLATE 16.7 03/03/2019   HGBA1C 5.6 03/18/2014   HIV NON REACTIVE 08/25/2013     CNS No results found for: "COLORCSF", "APPEARCSF", "RBCCOUNTCSF", "WBCCSF", "POLYSCSF", "LYMPHSCSF", "EOSCSF", "PROTEINCSF", "GLUCCSF", "JCVIRUS", "CSFOLI", "IGGCSF", "LABACHR", "ACETBL"   Inflammation (CRP: Acute  ESR: Chronic) Lab Results  Component Value Date   ESRSEDRATE 14 04/10/2014     Rheumatology No results found for: "RF", "ANA", "LABURIC", "URICUR", "LYMEIGGIGMAB", "LYMEABIGMQN", "HLAB27"   Coagulation Lab Results  Component Value Date   INR 1.0 01/30/2022   LABPROT 13.5 01/30/2022   APTT 20 (L) 01/30/2022   PLT 228 01/31/2022   AT3 120 03/15/2019     Cardiovascular Lab Results  Component Value Date   CKTOTAL 87 08/17/2014   CKMB < 0.5 (L) 08/17/2014   TROPONINI <0.03 02/28/2017   HGB 12.1 01/31/2022   HCT 35.5 (L) 01/31/2022     Screening Lab Results  Component Value Date   SARSCOV2NAA POSITIVE (A) 05/31/2019   COVIDSOURCE NASOPHARYNGEAL 03/03/2019   STAPHAUREUS NEGATIVE 01/07/2022   MRSAPCR NEGATIVE 01/07/2022   HIV NON REACTIVE 08/25/2013   PREGTESTUR NEGATIVE 08/15/2019     Cancer Lab Results  Component Value Date   CEA 2.1 08/18/2014   CA125 40.7 (H) 02/15/2015     Allergens No results found for: "ALMOND", "APPLE", "ASPARAGUS", "AVOCADO", "BANANA", "BARLEY", "BASIL", "BAYLEAF", "GREENBEAN", "LIMABEAN", "WHITEBEAN", "BEEFIGE", "REDBEET", "BLUEBERRY", "BROCCOLI", "CABBAGE", "MELON", "CARROT", "CASEIN", "CASHEWNUT", "CAULIFLOWER", "CELERY"     Note: Lab results reviewed.  PFSH  Drug: Ms. Padden  reports no history of drug use. Alcohol:  reports that she  does not currently use alcohol. Tobacco:  reports that she has never smoked. She has never used smokeless tobacco. Medical:  has a past medical history of Abdominal pain, Anemia, Arthritis, Asthma, Bipolar 1 disorder (HCC), Blood in stool, Change in voice, Chest pain, Constipation, Depression, Diverticulitis, DVT (deep venous thrombosis) (HCC) (12/13/2014), Dysrhythmia, Fatigue, Gallstones (04/10/2011), GERD (gastroesophageal reflux disease), History of blood transfusion (02/2019), History of hiatal  hernia, History of kidney stones, History of methicillin resistant staphylococcus aureus (MRSA), Hypertension, Idiopathic non-specific interstitial pneumonitis (HCC), Migraines, MRSA carrier, Nausea, Pancreatitis, Pre-diabetes, PTSD (post-traumatic stress disorder), Pulmonary embolism (HCC) (12/13/2014), Sleep apnea, Takes iron supplements, Trouble swallowing, and Vocal cord polyps. Family: family history includes Alcohol abuse in her father, paternal aunt, paternal grandfather, and paternal uncle; Bipolar disorder in her mother, niece, and sister; Cancer (age of onset: 0) in her paternal aunt; Diabetes in her mother; Lung cancer in her father; Pancreatic cancer in her mother.  Past Surgical History:  Procedure Laterality Date   BACK SURGERY     BRAVO [facility] STUDY  06/02/2011   Procedure: BRAVO PH STUDY;  Surgeon: Louis Meckel, MD;  Location: [facility];  Service: Endoscopy;  Laterality: N/A;   COLONOSCOPY     CYSTOSCOPY  08/15/2019   Procedure: CYSTOSCOPY;  Surgeon: Vena Austria, MD;  Location: [facility] OR[facility];  Service: Gynecology;;   DILATION AND CURETTAGE OF UTERUS     ENDOMETRIAL ABLATION N/A 03/30/2019   Procedure: ENDOMETRIAL ABLATION;  Surgeon: Vena Austria, MD;  Location: [facility];  Service: Gynecology;  Laterality: N/A;   ESOPHAGOGASTRODUODENOSCOPY  06/02/2011   Procedure: ESOPHAGOGASTRODUODENOSCOPY (EGD);  Surgeon: Louis Meckel, MD;  Location: [facility];  Service: Endoscopy;   Laterality: N/A;   GASTRIC BYPASS  2021   HYSTEROSCOPY  03/30/2019   Procedure: HYSTEROSCOPY;  Surgeon: Vena Austria, MD;  Location: [facility];  Service: Gynecology;;   LAPAROSCOPIC CHOLECYSTECTOMY W/ CHOLANGIOGRAPHY  04/25/2011   Dr Jamey Ripa   LUMBAR LAMINECTOMY FOR EPIDURAL ABSCESS N/A 01/30/2022   Procedure: LUMBAR LAMINECTOMY FOR EPIDURAL ABSCESS;  Surgeon: Venetia Night, MD;  Location: [facility];  Service: Neurosurgery;  Laterality: N/A;  Per consent and verbal confirmation with the surgeon, the procedure performed was wound exploration, evacuation of epidural fluid collection   LUMBAR LAMINECTOMY/DECOMPRESSION MICRODISCECTOMY N/A 01/19/2022   Procedure: L4-S1 DECOMPRESSION;  Surgeon: Venetia Night, MD;  Location: [facility];  Service: Neurosurgery;  Laterality: N/A;   NASAL POLYP SURGERY     polyp from vocal cords   TOTAL LAPAROSCOPIC HYSTERECTOMY WITH SALPINGECTOMY Bilateral 08/15/2019   Procedure: TOTAL LAPAROSCOPIC HYSTERECTOMY WITH SALPINGECTOMY;  Surgeon: Vena Austria, MD;  Location: [facility];  Service: Gynecology;  Laterality: Bilateral;   TUBAL LIGATION  1996   WISDOM TOOTH EXTRACTION     Active Ambulatory Problems    Diagnosis Date Noted   Abdominal pain, generalized 05/28/2011   Esophageal reflux 05/28/2011   Schizoaffective disorder (HCC) 05/12/2013   PTSD (post-traumatic stress disorder) 05/12/2013   Bipolar I disorder, most recent episode depressed (HCC) 05/12/2013   High BMI 08/25/2013   Dysmenorrhea 08/25/2013   Abdominal bloating 08/25/2013   Endometrial polyp 09/30/2013   DVT (deep venous thrombosis) (HCC) 12/13/2014   Pulmonary embolism (HCC) 12/13/2014   Nonspecific interstitial pneumonia (HCC) 08/22/2014   Cough, persistent 04/30/2014   Obstructive apnea 04/30/2014   Asthma without status asthmaticus 08/10/2017   Depression 08/10/2017   Essential hypertension 04/16/2017   Obesity, Class III, BMI 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) 08/10/2017    Symptomatic anemia 03/03/2019   Acute deep vein thrombosis (DVT) of popliteal vein of right lower extremity (HCC) 03/15/2019   Iron deficiency anemia due to chronic blood loss 03/15/2019   Chronic constipation 03/21/2019   Menorrhagia 03/21/2019   Migraine 03/21/2019   Prediabetes 04/19/2017   Vocal cord polyps 03/21/2019   S/P hysterectomy 08/15/2019   S/P laparoscopic hysterectomy 08/15/2019   Chronic radicular lumbar pain 01/19/2022   Cauda equina compression (HCC)  01/30/2022   S/P gastric bypass 06/02/2021   Bipolar 1 disorder, depressed, moderate (HCC) 02/18/2022   GAD (generalized anxiety disorder) 02/18/2022   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) 04/16/2022   Insomnia due to medical condition 10/21/2022   Fatigue 03/05/2023   Lumbar post-laminectomy syndrome 04/15/2023   Resolved Ambulatory Problems    Diagnosis Date Noted   Gallstones 04/10/2011   Past Medical History:  Diagnosis Date   Abdominal pain    Anemia    Arthritis    Asthma    Bipolar 1 disorder (HCC)    Blood in stool    Change in voice    Chest pain    Constipation    Diverticulitis    Dysrhythmia    GERD (gastroesophageal reflux disease)    History of blood transfusion 02/2019   History of hiatal hernia    History of kidney stones    History of methicillin resistant staphylococcus aureus (MRSA)    Hypertension    Idiopathic non-specific interstitial pneumonitis (HCC)    Migraines    MRSA carrier    Nausea    Pancreatitis    Pre-diabetes    Sleep apnea    Takes iron supplements    Trouble swallowing    Constitutional Exam  General appearance: Well nourished, well developed, and well hydrated. In no apparent acute distress Vitals:   04/15/23 1310  BP: 103/62  Pulse: 78  Resp: 16  Temp: (!) 97.2 F (36.2 C)  TempSrc: Temporal  SpO2: 100%  Weight: 200 lb 12.8 oz (91.1 kg)  Height: 5\' 8"  (1.727 m)   BMI Assessment: Estimated body mass index is  30.53 kg/m as calculated from the following:   Height as of this encounter: 5\' 8"  (1.727 m).   Weight as of this encounter: 200 lb 12.8 oz (91.1 kg).  BMI interpretation table: BMI level Category Range association with higher incidence of chronic pain  <18 kg/m2 Underweight   18.5-24.9 kg/m2 Ideal body weight   25-29.9 kg/m2 Overweight Increased incidence by 20%  30-34.9 kg/m2 Obese (Class I) Increased incidence by 68%  35-39.9 kg/m2 Severe obesity (Class II) Increased incidence by 136%  >40 kg/m2 Extreme obesity (Class III) Increased incidence by 254%   Patient's current BMI Ideal Body weight  Body mass index is 30.53 kg/m. Ideal body weight: 63.9 kg (140 lb 14 oz) Adjusted ideal body weight: 74.8 kg (164 lb 13.5 oz)   BMI Readings from Last 4 Encounters:  04/15/23 30.53 kg/m  02/04/23 32.26 kg/m  03/03/22 30.15 kg/m  02/13/22 31.48 kg/m   Wt Readings from Last 4 Encounters:  04/15/23 200 lb 12.8 oz (91.1 kg)  02/04/23 206 lb (93.4 kg)  03/03/22 195 lb 6.4 oz (88.6 kg)  02/13/22 204 lb (92.5 kg)    Psych/Mental status: Alert, oriented x 3 (person, place, & time)       Eyes: PERLA Respiratory: No evidence of acute respiratory distress  Thoracic Spine Area Exam  Skin & Axial Inspection: No masses, redness, or swelling Alignment: Symmetrical Functional ROM: Unrestricted ROM Stability: No instability detected Muscle Tone/Strength: Functionally intact. No obvious neuro-muscular anomalies detected. Sensory (Neurological): Unimpaired Muscle strength & Tone: No palpable anomalies Lumbar Spine Area Exam  Skin & Axial Inspection: Well healed scar from previous spine surgery detected Alignment: Symmetrical Functional ROM: Pain restricted ROM affecting both sides, right greater than left Stability: No instability detected Muscle Tone/Strength: Functionally intact. No obvious neuro-muscular anomalies detected. Sensory (Neurological): Dermatomal pain pattern Palpation: No  palpable anomalies       Provocative Tests: Hyperextension/rotation test: deferred today       Lumbar quadrant test (Kemp's test): (+) on the right for foraminal stenosis Lateral bending test: (+) due to pain.  Gait & Posture Assessment  Ambulation: Unassisted Gait: Relatively normal for age and body habitus Posture: WNL  Lower Extremity Exam    Side: Right lower extremity  Side: Left lower extremity  Stability: No instability observed          Stability: No instability observed          Skin & Extremity Inspection: Edema  Skin & Extremity Inspection: Edema  Functional ROM: Unrestricted ROM                  Functional ROM: Unrestricted ROM                  Muscle Tone/Strength: Functionally intact. No obvious neuro-muscular anomalies detected.  Muscle Tone/Strength: Functionally intact. No obvious neuro-muscular anomalies detected.  Sensory (Neurological): Neurogenic pain pattern        Sensory (Neurological): Neurogenic pain pattern        DTR: Patellar: deferred today Achilles: deferred today Plantar: deferred today  DTR: Patellar: deferred today Achilles: deferred today Plantar: deferred today  Palpation: No palpable anomalies  Palpation: No palpable anomalies    Assessment  Primary Diagnosis & Pertinent Problem List: The primary encounter diagnosis was Failed back surgical syndrome. Diagnoses of Lumbar post-laminectomy syndrome and Chronic radicular lumbar pain were also pertinent to this visit.  Visit Diagnosis (New problems to examiner): 1. Failed back surgical syndrome   2. Lumbar post-laminectomy syndrome   3. Chronic radicular lumbar pain    Plan of Care (Initial workup plan)  We had a long discussion regarding spinal cord stimulation as well as peripheral nerve stimulation.  I presented both options to the patient because the patient was somewhat concerned about having a permanent implant.  I explained to her that spinal cord stimulation would entail a 7-day trial and  if successful she would subsequently had a implant done.  We discussed what a successful spinal cord stim trial entails.  I informed her that spinal cord stimulation is generally more effective for appendicular versus axial pain although we have had good results with treatment of both.  We also discussed peripheral nerve stimulation of the medial branch.  I explained to her what that entails utilizing a Sprint model.  I explained to her that this could be more effective for low back pain however it would not necessarily help out with her radiating right leg pain.  This would be a 60-day treatment and may not require a subsequent implant.  I was also able to view her lumbar spine under live fluoroscopy and while there are some osteophytes present I believe that there is patent percutaneous access for consideration of a percutaneous SCS trial.  I will obtain an thoracic lumbar MRI and a psych eval.  I have provided the patient with resources regarding both spinal cord stimulation and peripheral nerve stimulation.  She will think about this further do her due diligence and states that she will be in touch.  Imaging Orders         DG PAIN CLINIC C-ARM 1-60 MIN NO REPORT         MR THORACIC SPINE WO CONTRAST         MR LUMBAR SPINE WO CONTRAST     Referral Orders  Ambulatory referral to Psychology       Provider-requested follow-up: Return for after imaging studies.  Future Appointments  Date Time Provider Department Center  04/16/2023 11:00 AM Earna Coder, MD [facility] None  04/16/2023 11:45 AM [facility] None  06/14/2023 10:00 AM Jomarie Longs, MD [facility] None  06/23/2023 10:30 AM Anson Fret, MD [facility] None    Duration of encounter: .  Total time on encounter, as per AMA guidelines included both the face-to-face and non-face-to-face time personally spent by the physician and/or other qualified health care professional(s) on the day of the  encounter (includes time in activities that require the physician or other qualified health care professional and does not include time in activities normally performed by clinical staff). Physician's time may include the following activities when performed: Preparing to see the patient (e.g., pre-charting review of records, searching for previously ordered imaging, lab work, and nerve conduction tests) Review of prior analgesic pharmacotherapies. Reviewing PMP Interpreting ordered tests (e.g., lab work, imaging, nerve conduction tests) Performing post-procedure evaluations, including interpretation of diagnostic procedures Obtaining and/or reviewing separately obtained history Performing a medically appropriate examination and/or evaluation Counseling and educating the patient/family/caregiver Ordering medications, tests, or procedures Referring and communicating with other health care professionals (when not separately reported) Documenting clinical information in the electronic or other health record Independently interpreting results (not separately reported) and communicating results to the patient/ family/caregiver Care coordination (not separately reported)  Note by: Edward Jolly, MD (TTS technology used. I apologize for any typographical errors that were not detected and corrected.) Date: 04/15/2023; Time: 3:35 PM

## 2023-04-15 NOTE — Patient Instructions (Signed)
Please provide patient resources for [employer] (will require permament implant, helpful for back and leg pain) and Sprint PNS (USUALLY does NOT require perm implant, helpful for back pain) We will order thoracic, lumbar MRI, and psych eval (see if your therapist can do it)

## 2023-04-16 ENCOUNTER — Inpatient Hospital Stay: Payer: Medicare PPO | Admitting: Internal Medicine

## 2023-04-16 ENCOUNTER — Other Ambulatory Visit: Payer: Medicare PPO

## 2023-04-16 ENCOUNTER — Encounter: Payer: Self-pay | Admitting: Internal Medicine

## 2023-04-19 ENCOUNTER — Telehealth: Payer: Self-pay

## 2023-04-19 DIAGNOSIS — F411 Generalized anxiety disorder: Secondary | ICD-10-CM

## 2023-04-19 MED ORDER — DIAZEPAM 5 MG PO TABS: 5.0000 mg | ORAL_TABLET | ORAL | 0 refills | Status: DC

## 2023-04-19 NOTE — Telephone Encounter (Signed)
Scheduled MRI's for 04/24/23 Please call out valium

## 2023-04-20 ENCOUNTER — Inpatient Hospital Stay: Payer: Medicare PPO

## 2023-04-20 ENCOUNTER — Inpatient Hospital Stay: Payer: Medicare PPO | Attending: Internal Medicine | Admitting: Internal Medicine

## 2023-04-20 ENCOUNTER — Encounter: Payer: Self-pay | Admitting: Internal Medicine

## 2023-04-20 VITALS — BP 103/73 | HR 64 | Temp 98.0°F | Ht 68.0 in | Wt 196.6 lb

## 2023-04-20 DIAGNOSIS — Z86711 Personal history of pulmonary embolism: Secondary | ICD-10-CM | POA: Diagnosis present

## 2023-04-20 DIAGNOSIS — Z7901 Long term (current) use of anticoagulants: Secondary | ICD-10-CM

## 2023-04-20 DIAGNOSIS — Z79624 Long term (current) use of inhibitors of nucleotide synthesis: Secondary | ICD-10-CM

## 2023-04-20 DIAGNOSIS — Z86718 Personal history of other venous thrombosis and embolism: Secondary | ICD-10-CM | POA: Diagnosis not present

## 2023-04-20 DIAGNOSIS — I82431 Acute embolism and thrombosis of right popliteal vein: Secondary | ICD-10-CM

## 2023-04-20 DIAGNOSIS — M549 Dorsalgia, unspecified: Secondary | ICD-10-CM | POA: Diagnosis not present

## 2023-04-20 DIAGNOSIS — Z79899 Other long term (current) drug therapy: Secondary | ICD-10-CM

## 2023-04-20 DIAGNOSIS — M5382 Other specified dorsopathies, cervical region: Secondary | ICD-10-CM | POA: Diagnosis not present

## 2023-04-20 DIAGNOSIS — Z9884 Bariatric surgery status: Secondary | ICD-10-CM | POA: Diagnosis not present

## 2023-04-20 LAB — CBC WITH DIFFERENTIAL/PLATELET
Abs Immature Granulocytes: 0.02 10*3/uL (ref 0.00–0.07)
Basophils Absolute: 0.1 10*3/uL (ref 0.0–0.1)
Basophils Relative: 1 %
Eosinophils Absolute: 0.2 10*3/uL (ref 0.0–0.5)
Eosinophils Relative: 2 %
HCT: 41.2 % (ref 36.0–46.0)
Hemoglobin: 13.5 g/dL (ref 12.0–15.0)
Immature Granulocytes: 0 %
Lymphocytes Relative: 27 %
Lymphs Abs: 2.1 10*3/uL (ref 0.7–4.0)
MCH: 27.8 pg (ref 26.0–34.0)
MCHC: 32.8 g/dL (ref 30.0–36.0)
MCV: 84.8 fL (ref 80.0–100.0)
Monocytes Absolute: 0.7 10*3/uL (ref 0.1–1.0)
Monocytes Relative: 9 %
Neutro Abs: 4.8 10*3/uL (ref 1.7–7.7)
Neutrophils Relative %: 61 %
Platelets: 274 10*3/uL (ref 150–400)
RBC: 4.86 MIL/uL (ref 3.87–5.11)
RDW: 13.7 % (ref 11.5–15.5)
WBC: 7.9 10*3/uL (ref 4.0–10.5)
nRBC: 0 % (ref 0.0–0.2)

## 2023-04-20 LAB — COMPREHENSIVE METABOLIC PANEL
ALT: 23 U/L (ref 0–44)
AST: 21 U/L (ref 15–41)
Albumin: 4 g/dL (ref 3.5–5.0)
Alkaline Phosphatase: 50 U/L (ref 38–126)
Anion gap: 8 (ref 5–15)
BUN: 9 mg/dL (ref 6–20)
CO2: 24 mmol/L (ref 22–32)
Calcium: 8.9 mg/dL (ref 8.9–10.3)
Chloride: 105 mmol/L (ref 98–111)
Creatinine, Ser: 0.66 mg/dL (ref 0.44–1.00)
GFR, Estimated: >60 mL/min (ref >60)
Glucose, Bld: 79 mg/dL (ref 70–99)
Potassium: 3.6 mmol/L (ref 3.5–5.1)
Sodium: 137 mmol/L (ref 135–145)
Total Bilirubin: 0.8 mg/dL (ref 0.3–1.2)
Total Protein: 7.5 g/dL (ref 6.5–8.1)

## 2023-04-20 LAB — VITAMIN B12: Vitamin B-12: 258 pg/mL (ref 180–914)

## 2023-04-20 LAB — PROTIME-INR
INR: 1 (ref 0.8–1.2)
Prothrombin Time: 13.5 s (ref 11.4–15.2)

## 2023-04-20 LAB — APTT: aPTT: 24 s (ref 24–36)

## 2023-04-20 LAB — FERRITIN: Ferritin: 57 ng/mL (ref 11–307)

## 2023-04-20 LAB — IRON AND TIBC
Iron: 117 ug/dL (ref 28–170)
Saturation Ratios: 26 % (ref 10.4–31.8)
TIBC: 456 ug/dL — ABNORMAL HIGH (ref 250–450)
UIBC: 339 ug/dL

## 2023-04-20 LAB — LACTATE DEHYDROGENASE: LDH: 148 U/L (ref 98–192)

## 2023-04-20 MED ORDER — APIXABAN 5 MG PO TABS: 5.0000 mg | ORAL_TABLET | Freq: Two times a day (BID) | ORAL | 2 refills | Status: AC

## 2023-04-20 NOTE — Progress Notes (Signed)
On no blood thinner x1 week, states Dr. Meredeth Ide was to call in eliquis but hasn't yet.

## 2023-04-20 NOTE — Progress Notes (Signed)
[facility] CONSULT NOTE  Patient Care Team: [place], [facility] as PCP - General Earna Coder, MD as Consulting Physician (Oncology)  CHIEF COMPLAINTS/PURPOSE OF CONSULTATION: DVT/PE  #  Oncology History   No history exists.   # August 2020 acute DVT right posterior tibial vein/calf; superficial thrombosis-Eliquis   # FEB 2016- PULMONARY EMBOLISM/ hx R calf DVT [Dr.Gittin s/p surgery on lung anticoagulation for 6 months   # AUG 2020- Chronic iron deficient anemia-menorrhagia [Dr.Stabler]; Ferritin-5colo- ? 2 years ago.    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  female prior history of recurrent DVT history of PE-most recently on Coumadin is here to discuss anticoagulation management.  Patient states that for unclear reasons patient was switched from Eliquis to Coumadin for anticoagulation.  However noted to have difficulty managing the INR levels.   Patient complains of chronic fatigue.  Chronic back pain.  Otherwise denies any blood clots.  Denies any blood in stools or black-colored stools.  No nausea no vomiting.  No falls.  Review of Systems  Constitutional:  Positive for malaise/fatigue and weight loss. Negative for chills, diaphoresis and fever.  HENT:  Negative for nosebleeds and sore throat.   Eyes:  Negative for double vision.  Respiratory:  Negative for cough, hemoptysis, sputum production, shortness of breath and wheezing.   Cardiovascular:  Negative for chest pain, palpitations, orthopnea and leg swelling.  Gastrointestinal:  Negative for abdominal pain, blood in stool, constipation, diarrhea, heartburn, melena, nausea and vomiting.  Genitourinary:  Negative for dysuria, frequency and urgency.  Musculoskeletal:  Positive for back pain and joint pain.  Skin: Negative.  Negative for itching and rash.  Neurological:  Negative  for dizziness, tingling, focal weakness, weakness and headaches.  Endo/Heme/Allergies:  Does not bruise/bleed easily.  Psychiatric/Behavioral:  Negative for depression. The patient is not nervous/anxious and does not have insomnia.      MEDICAL HISTORY:  Past Medical History:  Diagnosis Date   Abdominal pain    d/t ongoing menstrual bleeding   Anemia    iron deficiency, receiving Feraheme transfusions   Arthritis    Asthma    nonspecific interstitial pneumonia.Marland Kitchendiagnosed 6 years ago   Bipolar 1 disorder (HCC)    Blood in stool    happens often but unsure of etiology.  will have colonoscopy   Change in voice    Chest pain    has often but is due to lung disease   Constipation    Depression    Diverticulitis    DVT (deep venous thrombosis) (HCC) 12/13/2014   Dysrhythmia    Fatigue    Gallstones 04/10/2011   with biliary pancreatitis   GERD (gastroesophageal reflux disease)    History of blood transfusion 02/2019   one unit d/t hgb being low   History of hiatal hernia    small   History of kidney stones    History of methicillin resistant staphylococcus aureus (MRSA)    Hypertension    Idiopathic non-specific interstitial pneumonitis (HCC)    Migraines    MRSA carrier    many years ago.   Nausea    Pancreatitis    Pre-diabetes    PTSD (post-traumatic stress disorder)    Pulmonary embolism (HCC) 12/13/2014   Sleep apnea    does not use cpap now.  will be retested soon   Takes iron supplements    FERAHEME weekly transfusions  as well as ferrous sulfate PO   Trouble swallowing    Vocal cord polyps     SURGICAL HISTORY: Past Surgical History:  Procedure Laterality Date   BACK SURGERY     BRAVO PH STUDY  06/02/2011   Procedure: BRAVO PH STUDY;  Surgeon: Louis Meckel, MD;  Location: [facility];  Service: Endoscopy;  Laterality: N/A;   COLONOSCOPY     CYSTOSCOPY  08/15/2019   Procedure: CYSTOSCOPY;  Surgeon: Vena Austria, MD;  Location: [facility];  Service:  Gynecology;;   DILATION AND CURETTAGE OF UTERUS     ENDOMETRIAL ABLATION N/A 03/30/2019   Procedure: ENDOMETRIAL ABLATION;  Surgeon: Vena Austria, MD;  Location: [facility];  Service: Gynecology;  Laterality: N/A;   ESOPHAGOGASTRODUODENOSCOPY  06/02/2011   Procedure: ESOPHAGOGASTRODUODENOSCOPY (EGD);  Surgeon: Louis Meckel, MD;  Location: [facility];  Service: Endoscopy;  Laterality: N/A;   GASTRIC BYPASS  2021   HYSTEROSCOPY  03/30/2019   Procedure: HYSTEROSCOPY;  Surgeon: Vena Austria, MD;  Location: [facility];  Service: Gynecology;;   LAPAROSCOPIC CHOLECYSTECTOMY W/ CHOLANGIOGRAPHY  04/25/2011   Dr Jamey Ripa   LUMBAR LAMINECTOMY FOR EPIDURAL ABSCESS N/A 01/30/2022   Procedure: LUMBAR LAMINECTOMY FOR EPIDURAL ABSCESS;  Surgeon: Venetia Night, MD;  Location: [facility];  Service: Neurosurgery;  Laterality: N/A;  Per consent and verbal confirmation with the surgeon, the procedure performed was wound exploration, evacuation of epidural fluid collection   LUMBAR LAMINECTOMY/DECOMPRESSION MICRODISCECTOMY N/A 01/19/2022   Procedure: L4-S1 DECOMPRESSION;  Surgeon: Venetia Night, MD;  Location: [facility];  Service: Neurosurgery;  Laterality: N/A;   NASAL POLYP SURGERY     polyp from vocal cords   TOTAL LAPAROSCOPIC HYSTERECTOMY WITH SALPINGECTOMY Bilateral 08/15/2019   Procedure: TOTAL LAPAROSCOPIC HYSTERECTOMY WITH SALPINGECTOMY;  Surgeon: Vena Austria, MD;  Location: [facility];  Service: Gynecology;  Laterality: Bilateral;   TUBAL LIGATION  1996   WISDOM TOOTH EXTRACTION      SOCIAL HISTORY: Social History   Socioeconomic History   Marital status: Divorced    Spouse name: Not on file   Number of children: 7   Years of education: Not on file   Highest education level: Some college, no degree  Occupational History    Comment: disability  Tobacco Use   Smoking status: Never   Smokeless tobacco: Never  Vaping Use   Vaping status: Never Used  Substance and Sexual  Activity   Alcohol use: Not Currently    Comment: rare   Drug use: No   Sexual activity: Yes    Partners: Male    Birth control/protection: Surgical  Other Topics Concern   Not on file  Social History Narrative   In [place]; with 2 sons; used to work in Physicist, medical. Never smoked; ocassional alcohol.    Social Determinants of Health   Financial Resource Strain: Low Risk  (04/13/2023)   Received from [facility]   Overall Financial Resource Strain (CARDIA)    Difficulty of Paying Living Expenses: Not hard at all  Food Insecurity: Food Insecurity Present (04/20/2023)   Hunger Vital Sign    Worried About Running Out of Food in the Last Year: Sometimes true    Ran Out of Food in the Last Year: Often true  Transportation Needs: Unmet Transportation Needs (04/20/2023)   PRAPARE - Administrator, Civil Service (Medical): No    Lack of Transportation (Non-Medical): Yes  Physical Activity: Not on file  Stress: Not on file  Social Connections: Not on file  Intimate Partner Violence: Not At Risk (04/20/2023)   Humiliation, Afraid, Rape, and Kick questionnaire    Fear of Current or Ex-Partner: No    Emotionally Abused: No    Physically Abused: No    Sexually Abused: No    FAMILY HISTORY: Family History  Problem Relation Age of Onset   Bipolar disorder Mother    Diabetes Mother    Pancreatic cancer Mother    Alcohol abuse Father    Lung cancer Father    Bipolar disorder Sister    Alcohol abuse Paternal Aunt    Cancer Paternal Aunt 0   Alcohol abuse Paternal Uncle    Alcohol abuse Paternal Grandfather    Bipolar disorder Niece    Anesthesia problems Neg Hx    Hypotension Neg Hx    Malignant hyperthermia Neg Hx    Pseudochol deficiency Neg Hx     ALLERGIES:  is allergic to lisinopril and morphine and codeine.  MEDICATIONS:  Current Outpatient Medications  Medication Sig Dispense Refill   acetaminophen (TYLENOL) 500 MG tablet Take 500 mg by mouth  every 6 (six) hours as needed.     AIMOVIG 140 MG/ML SOAJ Inject 140 mg into the skin every 30 (thirty) days.     albuterol (PROVENTIL HFA;VENTOLIN HFA) 108 (90 Base) MCG/ACT inhaler Inhale 2 puffs into the lungs every 6 (six) hours as needed for wheezing or shortness of breath. 1 Inhaler 2   apixaban (ELIQUIS) 5 MG TABS tablet Take 1 tablet (5 mg total) by mouth 2 (two) times daily. 60 tablet 2   diazepam (VALIUM) 5 MG tablet Take 1 tablet (5 mg total) by mouth 60 (sixty) minutes before procedure for 1 dose. 1 tab PO 60 min pre-MRI. If still anxious, take 2nd tab 15 min just before MRI. Max: 2 tbs (10 mg). Avoid taking opioid pain medications within 4 hours of taking valium. Must have a driver. Do not drive or operate machinery x 24 hours after taking this medication. 2 tablet 0   gabapentin (NEURONTIN) 300 MG capsule TAKE 1 CAPSULE BY MOUTH IN THE MORNING AND AFTERNOON, AND TAKE 2 CAPSULES AT NIGHT 120 capsule 0   hydrOXYzine (ATARAX) 25 MG tablet Take 1 tablet (25 mg total) by mouth 2 (two) times daily as needed for anxiety. 180 tablet 0   Ipratropium-Albuterol (COMBIVENT) 20-100 MCG/ACT AERS respimat Inhale 1 puff into the lungs every morning.     Multiple Vitamin (MULTIVITAMIN) tablet Take 1 tablet by mouth daily.     phentermine 15 MG capsule Take 37.5 mg by mouth every morning.     Rimegepant Sulfate 75 MG TBDP Take by mouth.     SUMAtriptan (IMITREX) 100 MG tablet Take 100 mg by mouth every 2 (two) hours as needed for migraine.      topiramate (TOPAMAX) 25 MG tablet Take 3 tablets (75 mg total) by mouth daily. 270 tablet 0   traZODone (DESYREL) 100 MG tablet Take 1 tablet (100 mg total) by mouth at bedtime. 90 tablet 0   valACYclovir (VALTREX) 1000 MG tablet Take 1,000 mg by mouth 2 (two) times daily.     pantoprazole (PROTONIX) 40 MG tablet Take 1 tablet (40 mg total) by mouth every morning. 30 tablet 11   rizatriptan (MAXALT) 10 MG tablet Take by mouth.     warfarin (COUMADIN) 1 MG tablet  TAKE 1 TABLET BY MOUTH NIGHTLY ALONG WITH 5MG  TABLET FOR A TOTAL DAILY DOSE OF 6MG  (Patient not taking: Reported on 04/15/2023)  warfarin (COUMADIN) 5 MG tablet TAKE 1 TABLET BY MOUTH NIGHTLY ALONG WITH 1MG  TABLET FOR A TOTAL OF 6MG  DAILY (Patient not taking: Reported on 04/15/2023)     No current facility-administered medications for this visit.       PHYSICAL EXAMINATION:  Vitals:   04/20/23 1400  BP: 103/73  Pulse: 64  Temp: 98 F (36.7 C)  SpO2: 97%   Filed Weights   04/20/23 1400  Weight: 196 lb 9.6 oz (89.2 kg)    Physical Exam Vitals and nursing note reviewed.  HENT:     Head: Normocephalic and atraumatic.     Mouth/Throat:     Pharynx: Oropharynx is clear.  Eyes:     Extraocular Movements: Extraocular movements intact.     Pupils: Pupils are equal, round, and reactive to light.  Cardiovascular:     Rate and Rhythm: Normal rate and regular rhythm.  Pulmonary:     Comments: Decreased breath sounds bilaterally.  Abdominal:     Palpations: Abdomen is soft.  Musculoskeletal:        General: Normal range of motion.     Cervical back: Normal range of motion.  Skin:    General: Skin is warm.  Neurological:     General: No focal deficit present.     Mental Status: She is alert and oriented to person, place, and time.  Psychiatric:        Behavior: Behavior normal.        Judgment: Judgment normal.      LABORATORY DATA:  I have reviewed the data as listed Lab Results  Component Value Date   WBC 7.9 04/20/2023   HGB 13.5 04/20/2023   HCT 41.2 04/20/2023   MCV 84.8 04/20/2023   PLT 274 04/20/2023   Recent Labs    04/20/23 1503  NA 137  K 3.6  CL 105  CO2 24  GLUCOSE 79  BUN 9  CREATININE 0.66  CALCIUM 8.9  GFRNONAA >60  PROT 7.5  ALBUMIN 4.0  AST 21  ALT 23  ALKPHOS 50  BILITOT 0.8    RADIOGRAPHIC STUDIES: I have personally reviewed the radiological images as listed and agreed with the findings in the report. DG PAIN CLINIC C-ARM 1-60  MIN NO REPORT  Result Date: 04/15/2023 Fluoro was used, but no Radiologist interpretation will be provided. Please refer to "NOTES" tab for provider progress note.   ASSESSMENT & PLAN:   Acute deep vein thrombosis (DVT) of popliteal vein of right lower extremity (HCC) # FEB 2016- Acute PE/  DVT- right posterior popliteal vein; and 2020- acute DVT- previously on eliquis; switched to Coumadin [Dr.Fleming].  Currently-INR difficult manage question related to gastric bypass.  # Given the history of unprovoked DVT in 2020/; and given prior history of question provoked DVT PE in 2016-I would. Recommend long term/indefinite anti-coagulation.  Reviewed her hypercoagulable work-up negative.  Given difficulty maintaining PT/INR-I would recommend Eliquis 5 mg twice daily.  Prescription sent.  # History of gastric bypass-iron studies hemoglobin normal.  Hold off any iron infusion.  August 2024 B12 low at 144-recommend sublingual B12/barimelts.   Thank you Dr.Fleming for allowing me to participate in the care of your pleasant patient. Please do not hesitate to contact me with questions or concerns in the interim.  As patient's labs are adequate-I would r not recommend any iron infusions.  Patient will follow-up with [place] as needed.  Send a prescription for Eliquis.  Going forward she could get Eliquis refills with  Dr. Meredeth Ide.  # DISPOSITION: # labs- today ordered # follow up TBD--Dr.B       Earna Coder, MD 04/20/2023 5:39 PM

## 2023-04-20 NOTE — Assessment & Plan Note (Addendum)
#   FEB 2016- Acute PE/  DVT- right posterior popliteal vein; and 2020- acute DVT- previously on eliquis; switched to Coumadin [Dr.Fleming].  Currently-INR difficult manage question related to gastric bypass.  # Given the history of unprovoked DVT in 2020/; and given prior history of question provoked DVT PE in 2016-I would. Recommend long term/indefinite anti-coagulation.  Reviewed her hypercoagulable work-up negative.  Given difficulty maintaining PT/INR-I would recommend Eliquis 5 mg twice daily.  Prescription sent.  # History of gastric bypass-iron studies hemoglobin normal.  Hold off any iron infusion.  August 2024 B12 low at 144-recommend sublingual B12/barimelts.   Thank you Dr.Fleming for allowing me to participate in the care of your pleasant patient. Please do not hesitate to contact me with questions or concerns in the interim.  As patient's labs are adequate-I would r not recommend any iron infusions.  Patient will follow-up with [place] as needed.  Send a prescription for Eliquis.  Going forward she could get Eliquis refills with Dr. Meredeth Ide.  # DISPOSITION: # labs- today ordered # follow up TBD--Dr.B

## 2023-04-24 ENCOUNTER — Ambulatory Visit
Admission: RE | Admit: 2023-04-24 | Discharge: 2023-04-24 | Disposition: A | Discharge status: Home / Self Care | Payer: Medicare PPO | Source: Ambulatory Visit | Attending: Student in an Organized Health Care Education/Training Program | Admitting: Student in an Organized Health Care Education/Training Program

## 2023-04-24 DIAGNOSIS — G8929 Other chronic pain: Secondary | ICD-10-CM

## 2023-04-24 DIAGNOSIS — M5416 Radiculopathy, lumbar region: Secondary | ICD-10-CM | POA: Diagnosis present

## 2023-04-24 DIAGNOSIS — M961 Postlaminectomy syndrome, not elsewhere classified: Secondary | ICD-10-CM | POA: Diagnosis present

## 2023-05-22 ENCOUNTER — Encounter: Payer: Self-pay | Admitting: Internal Medicine

## 2023-06-14 ENCOUNTER — Encounter: Payer: Self-pay | Admitting: Internal Medicine

## 2023-06-14 ENCOUNTER — Ambulatory Visit: Payer: No Typology Code available for payment source | Admitting: Psychiatry

## 2023-06-23 ENCOUNTER — Ambulatory Visit: Payer: Medicare PPO | Admitting: Neurology

## 2023-06-23 ENCOUNTER — Encounter: Payer: Self-pay | Admitting: Neurology

## 2023-06-25 ENCOUNTER — Other Ambulatory Visit: Payer: Self-pay

## 2023-06-25 ENCOUNTER — Emergency Department
Admission: EM | Admit: 2023-06-25 | Discharge: 2023-06-25 | Disposition: A | Discharge status: Home / Self Care | Payer: Medicare PPO | Attending: Emergency Medicine | Admitting: Emergency Medicine

## 2023-06-25 ENCOUNTER — Encounter: Payer: Self-pay | Admitting: Emergency Medicine

## 2023-06-25 DIAGNOSIS — I10 Essential (primary) hypertension: Secondary | ICD-10-CM | POA: Insufficient documentation

## 2023-06-25 DIAGNOSIS — Z7901 Long term (current) use of anticoagulants: Secondary | ICD-10-CM | POA: Diagnosis not present

## 2023-06-25 DIAGNOSIS — M62830 Muscle spasm of back: Secondary | ICD-10-CM | POA: Insufficient documentation

## 2023-06-25 DIAGNOSIS — J45909 Unspecified asthma, uncomplicated: Secondary | ICD-10-CM | POA: Diagnosis not present

## 2023-06-25 DIAGNOSIS — Z87442 Personal history of urinary calculi: Secondary | ICD-10-CM | POA: Insufficient documentation

## 2023-06-25 DIAGNOSIS — M549 Dorsalgia, unspecified: Secondary | ICD-10-CM

## 2023-06-25 DIAGNOSIS — M545 Low back pain, unspecified: Secondary | ICD-10-CM | POA: Diagnosis present

## 2023-06-25 MED ORDER — OXYCODONE-ACETAMINOPHEN 5-325 MG PO TABS
1.0000 | ORAL_TABLET | Freq: Once | ORAL | Status: AC
  Administered 2023-06-25: 1 via ORAL
  Filled 2023-06-25: qty 1

## 2023-06-25 MED ORDER — NAPROXEN 500 MG PO TABS: 500.0000 mg | ORAL_TABLET | Freq: Two times a day (BID) | ORAL | 0 refills | Status: DC

## 2023-06-25 MED ORDER — DIAZEPAM 2 MG PO TABS
2.0000 mg | ORAL_TABLET | Freq: Once | ORAL | Status: AC
  Administered 2023-06-25: 2 mg via ORAL
  Filled 2023-06-25: qty 1

## 2023-06-25 MED ORDER — DIAZEPAM 2 MG PO TABS: 2.0000 mg | ORAL_TABLET | Freq: Three times a day (TID) | ORAL | 0 refills | Status: DC | PRN

## 2023-06-25 MED ORDER — KETOROLAC TROMETHAMINE 60 MG/2ML IM SOLN
30.0000 mg | Freq: Once | INTRAMUSCULAR | Status: AC
  Administered 2023-06-25: 30 mg via INTRAMUSCULAR
  Filled 2023-06-25: qty 2

## 2023-06-25 MED ORDER — OXYCODONE-ACETAMINOPHEN 5-325 MG PO TABS: 1.0000 | ORAL_TABLET | ORAL | 0 refills | Status: DC | PRN

## 2023-06-25 NOTE — Discharge Instructions (Signed)
You may take medicines as needed for pain & muscle spasms. Apply moist heat to affected area several times daily. Return to the ER for worsening symptoms, persistent vomiting, difficulty breathing, losing control of your bowel or bladder, or other concerns.

## 2023-06-25 NOTE — ED Triage Notes (Signed)
Patient ambulatory to triage with steady gait, without difficulty or distress noted pt reports hx of chronic back issues with surgeries and injections; for last several days having pain to shoulders radiating down to lower back and rt leg; denies any specific injury or aggravating factors

## 2023-06-25 NOTE — ED Notes (Signed)
Patient was supposed to notify this RN of arrival of safe ride. When going back to check on patient's ride status she left without notifying anyone.

## 2023-06-25 NOTE — ED Provider Notes (Signed)
[facility] Provider Note    Event Date/Time   First MD Initiated Contact with Patient 06/25/23 [phone]     (approximate)   History   Back Pain   HPI  Lisa [name] is a 52 y.o. female who presents to the ED from home with a chief complaint of acute on chronic back pain.  Patient with chronic back pain issues status post lumbar back surgery.  Also history of cervical stenosis.  Sees pain specialist but states she does not have a pain contract.  Not currently taking any narcotic pain medicines.  Reports a several day history of increased pain to her neck/bilateral shoulders rating down to her back and right leg.  Denies injury/fall/trauma.  Denies extremity weakness.  Denies bowel or bladder incontinence.     Past Medical History   Past Medical History:  Diagnosis Date   Abdominal pain    d/t ongoing menstrual bleeding   Anemia    iron deficiency, receiving Feraheme transfusions   Arthritis    Asthma    nonspecific interstitial pneumonia.Marland Kitchendiagnosed 6 years ago   Bipolar 1 disorder (HCC)    Blood in stool    happens often but unsure of etiology.  will have colonoscopy   Change in voice    Chest pain    has often but is due to lung disease   Constipation    Depression    Diverticulitis    DVT (deep venous thrombosis) (HCC) 12/13/2014   Dysrhythmia    Fatigue    Gallstones 04/10/2011   with biliary pancreatitis   GERD (gastroesophageal reflux disease)    History of blood transfusion 02/2019   one unit d/t hgb being low   History of hiatal hernia    small   History of kidney stones    History of methicillin resistant staphylococcus aureus (MRSA)    Hypertension    Idiopathic non-specific interstitial pneumonitis (HCC)    Migraines    MRSA carrier    many years ago.   Nausea    Pancreatitis    Pre-diabetes    PTSD (post-traumatic stress disorder)    Pulmonary embolism (HCC) 12/13/2014   Sleep apnea    does not use cpap now.   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) 04/16/2022   Bipolar 1 disorder, depressed, moderate (HCC) 02/18/2022   GAD (generalized anxiety disorder) 02/18/2022   Cauda equina compression (HCC) 01/30/2022   Chronic radicular lumbar pain 01/19/2022   S/P gastric bypass 06/02/2021   S/P hysterectomy 08/15/2019   S/P laparoscopic hysterectomy 08/15/2019   Chronic constipation 03/21/2019   Menorrhagia 03/21/2019   Migraine 03/21/2019   Vocal cord polyps 03/21/2019   Acute deep vein thrombosis (DVT) of popliteal vein of right lower extremity (HCC) 03/15/2019   Iron deficiency anemia due to chronic blood loss 03/15/2019   Symptomatic anemia 03/03/2019   Asthma without status asthmaticus 08/10/2017   Depression 08/10/2017   Obesity, Class III, BMI 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) 08/10/2017   Prediabetes 04/19/2017   Essential hypertension 04/16/2017   DVT (deep venous thrombosis) (  HCC) 12/13/2014   Pulmonary embolism (HCC) 12/13/2014   Nonspecific interstitial pneumonia (HCC) 08/22/2014   Cough, persistent 04/30/2014   Obstructive apnea 04/30/2014   Endometrial polyp 09/30/2013   High BMI 08/25/2013   Dysmenorrhea 08/25/2013   Abdominal bloating 08/25/2013   Schizoaffective disorder (HCC) 05/12/2013   PTSD (post-traumatic stress disorder) 05/12/2013   Bipolar I disorder, most recent episode depressed (HCC) 05/12/2013   Abdominal pain, generalized 05/28/2011   Esophageal reflux 05/28/2011     Past Surgical History   Past Surgical History:  Procedure Laterality Date   BACK SURGERY     BRAVO PH STUDY   06/02/2011   Procedure: BRAVO PH STUDY;  Surgeon: Louis Meckel, MD;  Location: [facility];  Service: Endoscopy;  Laterality: N/A;   COLONOSCOPY     CYSTOSCOPY  08/15/2019   Procedure: CYSTOSCOPY;  Surgeon: Vena Austria, MD;  Location: [facility];  Service: Gynecology;;   DILATION AND CURETTAGE OF UTERUS     ENDOMETRIAL ABLATION N/A 03/30/2019   Procedure: ENDOMETRIAL ABLATION;  Surgeon: Vena Austria, MD;  Location: [facility];  Service: Gynecology;  Laterality: N/A;   ESOPHAGOGASTRODUODENOSCOPY  06/02/2011   Procedure: ESOPHAGOGASTRODUODENOSCOPY (EGD);  Surgeon: Louis Meckel, MD;  Location: [facility];  Service: Endoscopy;  Laterality: N/A;   GASTRIC BYPASS  2021   HYSTEROSCOPY  03/30/2019   Procedure: HYSTEROSCOPY;  Surgeon: Vena Austria, MD;  Location: [facility];  Service: Gynecology;;   LAPAROSCOPIC CHOLECYSTECTOMY W/ CHOLANGIOGRAPHY  04/25/2011   Dr Jamey Ripa   LUMBAR LAMINECTOMY FOR EPIDURAL ABSCESS N/A 01/30/2022   Procedure: LUMBAR LAMINECTOMY FOR EPIDURAL ABSCESS;  Surgeon: Venetia Night, MD;  Location: [facility];  Service: Neurosurgery;  Laterality: N/A;  Per consent and verbal confirmation with the surgeon, the procedure performed was wound exploration, evacuation of epidural fluid collection   LUMBAR LAMINECTOMY/DECOMPRESSION MICRODISCECTOMY N/A 01/19/2022   Procedure: L4-S1 DECOMPRESSION;  Surgeon: Venetia Night, MD;  Location: [facility];  Service: Neurosurgery;  Laterality: N/A;   NASAL POLYP SURGERY     polyp from vocal cords   TOTAL LAPAROSCOPIC HYSTERECTOMY WITH SALPINGECTOMY Bilateral 08/15/2019   Procedure: TOTAL LAPAROSCOPIC HYSTERECTOMY WITH SALPINGECTOMY;  Surgeon: Vena Austria, MD;  Location: [facility];  Service: Gynecology;  Laterality: Bilateral;   TUBAL LIGATION  1996   WISDOM TOOTH EXTRACTION       Home Medications   Prior to Admission medications   Medication Sig Start Date End Date Taking? Authorizing Provider  diazepam (VALIUM)  2 MG tablet Take 1 tablet (2 mg total) by mouth every 8 (eight) hours as needed for muscle spasms. 06/25/23  Yes Irean Hong, MD  naproxen (NAPROSYN) 500 MG tablet Take 1 tablet (500 mg total) by mouth 2 (two) times daily with a meal. 06/25/23  Yes Irean Hong, MD  oxyCODONE-acetaminophen (PERCOCET/ROXICET) 5-325 MG tablet Take 1 tablet by mouth every 4 (four) hours as needed for severe pain (pain score 7-10). 06/25/23  Yes Irean Hong, MD  acetaminophen (TYLENOL) 500 MG tablet Take 500 mg by mouth every 6 (six) hours as needed.    [provider]  AIMOVIG 140 MG/ML SOAJ Inject 140 mg into the skin every 30 (thirty) days. 02/19/19   [provider]  albuterol (PROVENTIL HFA;VENTOLIN HFA) 108 (90 Base) MCG/ACT inhaler Inhale 2 puffs into the lungs every 6 (six) hours as needed for wheezing or shortness of breath. 02/28/17   Nita Sickle, MD  apixaban (ELIQUIS) 5 MG TABS tablet Take 1 tablet (5 mg total) by mouth 2 (  two) times daily. 04/20/23   Earna Coder, MD  gabapentin (NEURONTIN) 300 MG capsule TAKE 1 CAPSULE BY MOUTH IN THE MORNING AND AFTERNOON, AND TAKE 2 CAPSULES AT NIGHT 02/09/23   Drake Leach, PA-C  hydrOXYzine (ATARAX) 25 MG tablet Take 1 tablet (25 mg total) by mouth 2 (two) times daily as needed for anxiety. 12/03/22   Jomarie Longs, MD  Ipratropium-Albuterol (COMBIVENT) 20-100 MCG/ACT AERS respimat Inhale 1 puff into the lungs every morning.    [provider]  Multiple Vitamin (MULTIVITAMIN) tablet Take 1 tablet by mouth daily.    [provider]  pantoprazole (PROTONIX) 40 MG tablet Take 1 tablet (40 mg total) by mouth every morning. 01/24/22 04/15/23  Abd-El-Barr, Jerolyn Center, MD  phentermine 15 MG capsule Take 37.5 mg by mouth every morning.    [provider]  Rimegepant Sulfate 75 MG TBDP Take by mouth. 03/31/22   [provider]  rizatriptan (MAXALT) 10 MG tablet Take by mouth. 03/31/22 04/15/23  [provider]   SUMAtriptan (IMITREX) 100 MG tablet Take 100 mg by mouth every 2 (two) hours as needed for migraine.  02/10/19   [provider]  topiramate (TOPAMAX) 25 MG tablet Take 3 tablets (75 mg total) by mouth daily. 03/05/23   Jomarie Longs, MD  traZODone (DESYREL) 100 MG tablet Take 1 tablet (100 mg total) by mouth at bedtime. 03/05/23   Jomarie Longs, MD  valACYclovir (VALTREX) 1000 MG tablet Take 1,000 mg by mouth 2 (two) times daily.    [provider]  warfarin (COUMADIN) 1 MG tablet TAKE 1 TABLET BY MOUTH NIGHTLY ALONG WITH 5MG  TABLET FOR A TOTAL DAILY DOSE OF 6MG  Patient not taking: Reported on 04/15/2023 08/17/22   [provider]  warfarin (COUMADIN) 5 MG tablet TAKE 1 TABLET BY MOUTH NIGHTLY ALONG WITH 1MG  TABLET FOR A TOTAL OF 6MG  DAILY Patient not taking: Reported on 04/15/2023 08/17/22   [provider]     Allergies  Lisinopril and Morphine and codeine   Family History   Family History  Problem Relation Age of Onset   Bipolar disorder Mother    Diabetes Mother    Pancreatic cancer Mother    Alcohol abuse Father    Lung cancer Father    Bipolar disorder Sister    Alcohol abuse Paternal Aunt    Cancer Paternal Aunt 0   Alcohol abuse Paternal Uncle    Alcohol abuse Paternal Grandfather    Bipolar disorder Niece    Anesthesia problems Neg Hx    Hypotension Neg Hx    Malignant hyperthermia Neg Hx    Pseudochol deficiency Neg Hx      Physical Exam  Triage Vital Signs: ED Triage Vitals  Encounter Vitals Group     BP 06/25/23 0051 129/86     Systolic BP Percentile --      Diastolic BP Percentile --      Pulse Rate 06/25/23 0051 63     Resp 06/25/23 0051 18     Temp 06/25/23 0051 98.3 F (36.8 C)     Temp src --      SpO2 06/25/23 0051 95 %     Weight 06/25/23 0050 192 lb (87.1 kg)     Height 06/25/23 0050 5\' 8"  (1.727 m)     Head Circumference --      Peak Flow --      Pain Score 06/25/23 0050 8     Pain Loc --  Pain  Education --      Exclude from Growth Chart --     Updated Vital Signs: BP 125/86   Pulse 60   Temp 98.2 F (36.8 C) (Oral)   Resp 20   Ht 5\' 8"  (1.727 m)   Wt 87.1 kg   LMP 07/17/2019 (Approximate)   SpO2 99%   BMI 29.19 kg/m    General: Awake, mild distress.  CV:  RRR.  Good peripheral perfusion.  Resp:  Normal effort.  CTAB. Abd:  Nontender.  No distention.  Other:  No spinal tenderness to palpation.  Bilateral trapezius muscle spasms.  Bilateral paralumbar stiffness.  5/5 motor strength and sensation all extremities.  Ambulatory with steady gait.   ED Results / Procedures / Treatments  Labs (all labs ordered are listed, but only abnormal results are displayed) Labs Reviewed - No data to display   EKG  None   RADIOLOGY None   Official radiology report(s): No results found.   PROCEDURES:  Critical Care performed: No  Procedures   MEDICATIONS ORDERED IN ED: Medications  ketorolac (TORADOL) injection 30 mg (30 mg Intramuscular Given 06/25/23 0209)  oxyCODONE-acetaminophen (PERCOCET/ROXICET) 5-325 MG per tablet 1 tablet (1 tablet Oral Given 06/25/23 0209)  diazepam (VALIUM) tablet 2 mg (2 mg Oral Given 06/25/23 0209)     IMPRESSION / MDM / ASSESSMENT AND PLAN / ED COURSE  I reviewed the triage vital signs and the nursing notes.                             52 year old female presenting with acute on chronic back pain.  Will administer IM Ketorolac, Percocet, Valium for muscle relaxation.  Patient will follow-up with her pain specialist.  Strict return precautions given.  Patient verbalizes understanding and agrees with plan of care.  Patient's presentation is most consistent with acute, uncomplicated illness.    FINAL CLINICAL IMPRESSION(S) / ED DIAGNOSES   Final diagnoses:  Acute bilateral back pain, unspecified back location  Muscle spasm of back     Rx / DC Orders   ED Discharge Orders          Ordered    oxyCODONE-acetaminophen  (PERCOCET/ROXICET) 5-325 MG tablet  Every 4 hours PRN        06/25/23 0203    diazepam (VALIUM) 2 MG tablet  Every 8 hours PRN        06/25/23 0203    naproxen (NAPROSYN) 500 MG tablet  2 times daily with meals        06/25/23 0203             Note:  This document was prepared using Dragon voice recognition software and may include unintentional dictation errors.   Irean Hong, MD 06/25/23 [phone]

## 2023-07-13 ENCOUNTER — Other Ambulatory Visit: Payer: Self-pay | Admitting: Internal Medicine

## 2023-10-29 NOTE — Progress Notes (Signed)
 Chief Complaint  Patient presents with  . Annual Exam    Subjective:   Lisa [name] is a 53 y.o. female here for a health maintenance visit.  Patient is an established pt Additional concerns addressed today:   HPI History of Present Illness Lisa [name] is a 53 year old female who presents for an annual physical exam.  She has experienced a weight loss of ten pounds since her last visit, which she is pleased about. She is on phentermine and topiramate  for weight management. Her diet remains consistent, with an emphasis on fruits and vegetables, and she engages in some physical activity. Despite the weight loss, she continues to experience persistent back pain, particularly on the right side, which remains problematic following her history of back surgery.  She experiences recurrent rashes in the groin folds and under the belly, which flare up occasionally. These rashes occur in areas where skin touches skin, leading to moisture accumulation and potential yeast development. She finds it difficult to access care quickly when the rash flares up.  She has difficulty hearing in crowds and with background noise, and it has been years since her last audiogram.  She reports constipation, attributing it to insufficient water intake, and acknowledges not drinking as much water as she should, which affects her bowel movements.  She experiences abdominal pain, particularly in the area where her gallbladder was removed in 2012. The pain is described as tender, and she often ignores it.   Patient Active Problem List  Diagnosis  . Persistent cough  . History of pulmonary embolism  . Vocal cord polyps  . Migraine  . Esophageal reflux  . Depression, unspecified  . Chronic constipation  . Asthma without status asthmaticus, unspecified  . NSIP (nonspecific interstitial pneumonia) (CMS/HHS-HCC)  . OSA on CPAP  . Schizoaffective disorder (CMS/HHS-HCC)  . Bipolar 1 disorder  (CMS/HHS-HCC)  . PTSD (post-traumatic stress disorder)  . Prediabetes  . History of deep venous thrombosis  . Iron  deficiency anemia due to chronic blood loss  . Class 1 obesity due to excess calories with serious comorbidity and body mass index (BMI) of 31.0 to 31.9 in adult  . Acquired absence of both cervix and uterus  . S/P gastric bypass  . Bipolar 1 disorder, depressed, moderate (CMS/HHS-HCC)  . Cauda equina compression (CMS/HHS-HCC)  . Lumbar radiculopathy  . GAD (generalized anxiety disorder)  . Insomnia due to medical condition  . Vitamin B12 deficiency    Outpatient Medications Prior to Visit  Medication Sig Dispense Refill  . AIMOVIG AUTOINJECTOR 140 mg/mL AtIn     . apixaban  (ELIQUIS ) 5 mg tablet Take 1 tablet (5 mg total) by mouth every 12 (twelve) hours 60 tablet 6  . ARIPiprazole  (ABILIFY ) 2 MG tablet Take by mouth    . atogepant 60 mg Tab Take 60 mg by mouth once daily 30 tablet 3  . azaTHIOprine  (IMURAN ) 50 mg tablet Take 1 tablet by mouth once daily 30 tablet 0  . hydrOXYzine  (ATARAX ) 25 MG tablet TAKE 1 TABLET BY MOUTH TWICE DAILY AS NEEDED FOR ANXIETY AND FOR SLEEP    . ipratropium-albuteroL  (COMBIVENT  RESPIMAT) 20-100 mcg/actuation inhaler Inhale 1 inhalation into the lungs every morning    . multivitamin/iron /folic acid (COMPLETE MULTIVITAMIN-MINERAL ORAL) Take 1 tablet by mouth once daily    . pantoprazole  (PROTONIX ) 40 MG DR tablet Take 1 tablet (40 mg total) by mouth once daily 90 tablet 3  . topiramate  (TOPAMAX ) 25 MG tablet Take 50 mg by mouth once  daily    . traZODone  (DESYREL ) 100 MG tablet Take 100 mg by mouth at bedtime    . ubrogepant 100 mg Tab Take 100 mg by mouth as directed start with 1/2 tablet or 50 mg by mouth as needed. Can repeat dose in 1 - 2 hours if needed. 10 tablet 6  . valACYclovir  (VALTREX ) 1000 MG tablet Take 1 tablet (1,000 mg total) by mouth once daily 90 tablet 3  . VENTOLIN  HFA 90 mcg/actuation inhaler INHALE 2 PUFFS BY MOUTH EVERY 6  HOURS AS NEEDED FOR WHEEZING 18 g 0  . phentermine (ADIPEX-P) 37.5 mg tablet Take 1 tablet (37.5 mg total) by mouth every morning before breakfast 30 tablet 2  . budesonide  (PULMICORT ) 180 mcg/actuation inhaler Inhale 1 inhalation into the lungs 2 (two) times daily 1 each 4   Facility-Administered Medications Prior to Visit  Medication Dose Route Frequency Provider Last Rate Last Admin  . cyanocobalamin  (VITAMIN B12) injection 1,000 mcg  1,000 mcg Intramuscular Q30 Days Johnie Perkins [place], [place]   1,000 mcg at 10/29/23 1040    Social Drivers of Health with Concerns   Financial Resource Strain: Medium Risk (10/29/2023)   Overall Financial Resource Strain (CARDIA)   . Difficulty of Paying Living Expenses: Somewhat hard  Food Insecurity: Food Insecurity Present (10/29/2023)   Hunger Vital Sign   . Worried About Programme Researcher, Broadcasting/film/video in the Last Year: Sometimes true   . Ran Out of Food in the Last Year: Sometimes true  Depression: Mild depression (10/29/2023)   PHQ-9   . PHQ-9 Score: 6  Housing Stability: Unknown (10/29/2023)   Housing Stability Vital Sign   . Unable to Pay for Housing in the Last Year: Patient declined   . Number of Times Moved in the Last Year: 0   . Homeless in the Last Year: No      10/29/2023  [employer] Authorization for Release of Information - [employer]   Are any of your needs urgent? No  Would you like help with any of the needs that you have identified? No  Permission to provide our community partners with your name, address, and phone number so they can contact you Decline    Social History   Tobacco Use  . Smoking status: Never  . Smokeless tobacco: Never  . Tobacco comments:    No tobacco  Vaping Use  . Vaping status: Never Used  Substance Use Topics  . Alcohol use: Not Currently    Comment: Social  . Drug use: No   Other Health Habits: Exercise: see above Diet: well balanced Dental Exam: up to date Eye Exam: up to date  GYN: Sexual Health LMP:  Patient's last menstrual period was 08/10/2019 (exact date). Patient has no period. Menopausal or had hysterectomy Last pap smear: see HM section History of abnormal pap smears: No  Health Maintenance: See under health Maintenance activity for review of completion dates as well. Immunization History  Administered Date(s) Administered  . Flu Vaccine IIV3, IM PF (57mo+)(Fluarix, FluLaval, Fluzone) 05/13/2013  . Influenza IIV4, IM PF (6 mo+) (FLULAVAL/FLUZONE/FLUARIX QUAD) 04/16/2017  . Influenza IIV4, IM pres-free 05/13/2013  . PNEUMOCOCCAL (PPSV23)(>=105YRS -OR- >=2 YRS WITH RISK) VACCINE (PNEUMOVAX 23) 05/13/2013, 04/16/2017  . TDAP (>=64YR) VACCINE (ADACEL/BOOSTRIX) 11/01/2010    Health Maintenance Topics with due status: Overdue     Topic Date Due   COVID-19 Vaccine Never done   Shingrix Never done   Pneumococcal Vaccine: 50+ 04/16/2018   Adult Tetanus (Td And Tdap)  10/31/2020   Lipid Panel 05/01/2023   Medicare Subsequent AWV [id number] 05/01/2023   Annual Physical/Well Child Check 05/01/2023   Mammogram 06/12/2023   Health Maintenance Topics with due status: Not Due     Topic Last Completion Date   Influenza Vaccine 04/16/2017   Colorectal Cancer Screening 10/05/2019   Diabetes Screening 04/30/2022   Depression Screening 10/29/2023   Health Maintenance Topics with due status: Completed     Topic Last Completion Date   HIV Screen 02/17/2023   Hepatitis C Screen 02/17/2023   Health Maintenance Topics with due status: Aged Out     Topic Date Due   Hib Vaccines Aged Out   Hepatitis A Vaccines Aged Out   Meningococcal B Vaccine Aged Out   Meningococcal ACWY Vaccine Aged Out   HPV Vaccines Aged Out   Health Maintenance Topics with due status: Discontinued     Topic Date Due   Pap Smear Discontinued    Depression Screen-PHQ2/9 PHQ-2 PHQ-2 Over the last 2 weeks, how often have you been bothered by any of the following problems? Little interest or pleasure in doing  things: (Patient-Rptd) Several days Feeling down, depressed, or hopeless: (Patient-Rptd) Not at all Patient Health Questionnaire-2 Score: (Patient-Rptd) 1  PHQ-9 (if PHQ >=3) PHQ-9 Over the last 2 weeks, how often have you been bothered by any of the following problems? Trouble falling or staying asleep, or sleeping too much: (Patient-Rptd) Nearly every day Feeling tired or having little energy: (Patient-Rptd) Several days Poor appetite or overeating: (Patient-Rptd) Not at all Feeling bad about yourself - or that you are a failure or have let yourself or your family down: (Patient-Rptd) Not at all Trouble concentrating on things, such as reading the newspaper or watching television: (Patient-Rptd) Several days Moving or speaking so slowly that other people could have noticed? Or the opposite - being so fidgety or restless that you have been moving around a lot more than usual.: (Patient-Rptd) Not at all Thoughts that you would be better off dead or hurting yourself in some way: (Patient-Rptd) Not at all How difficult have these problems made it for you to do your work, take care of things at home, or get along with other people?: (Patient-Rptd) Somewhat difficult Patient Health Questionnaire-9 Score: (Patient-Rptd) 6  PHQ-2 Interpretation Values between 0-3 are considered not significant for depression  PHQ-9 Interpretation and Treatment Recommendations:  0-4= None  5-9= Mild / Treatment: Support, educate to call if worse; return in one month  10-14= Moderate / Treatment: Support, watchful waiting; Antidepressant or Psychotherapy  15-19= Moderately severe / Treatment: Antidepressant OR Psychotherapy  >= 20 = Major depression, severe / Antidepressant AND Psychotherapy  ROS   Objective:   Vitals:   10/29/23 1005  BP: 99/66  Pulse: 59  Weight: 81.8 kg (180 lb 6.4 oz)  Height: 172.7 cm (5' 7.99)  PainSc:   5  PainLoc: Back   Body mass index is 27.44 kg/m. Home vitals:      Physical Exam ABDOMEN: Tender on palpation Physical Exam  Constitutional: alert and in NAD Conversation: normal and appropriate HEENT: EOMI, external ear canals normal, tympanic membranes normal, and pupils equal and round Skin: normal Oropharynx: mucous membranes moist Neck: supple and no thyroid enlargement or cervical adenopathy Respiratory: clear to auscultation, without rales or wheezes Cardiovascular: regular rate and rhythm and without murmurs, rubs or gallops Abdomen: soft, TTP RUQ, nondistended, and no hepatosplenomegaly Musculoskeletal: age appropriate ROM of shoulders, hips and knees Extremities: no lower extremity edema,  dorsalis pedis pulses normal Neurological: grossly intact and normal gait  PE Chaperone note:  Breast Exam: Breast exam was deferred Pelvic Exam: Pelvic exam was deferred  Results   Assessment/Plan:   Patient was seen for a health maintenance exam.  Counseled the patient on health maintenance issues. Reviewed her health maintenance schedule and ordered appropriate tests. Counseled on regular exercise and weight management. Recommend regular eye exams and dental cleaning.   The following issues were addressed today for health maintenance: Diet - reviewed healthy diet and weight management Diet - discussed weight loss strategies Discussed importance of aerobic exercise and strength training Encouraged to increase or continue intake of vitamin D (goal 1000-2000 Int Units/day) Encouraged to schedule routine dental follow up Encouraged to schedule routine eye check Follow up for annual exam in one year Recommended sunscreen usage  The patient's BMI is above the acceptable range; discussed or provided materials on diet/exercise  Assessment & Plan RUQ abdominal Pain Patient did not initially have complaints of right upper quadrant pain, but on exam had significant tenderness with palpation to the area.  Abdominal exam otherwise unremarkable.   Tenderness in the abdominal area, particularly in the region of the previous cholecystectomy. Differential diagnosis includes muscle strain, issues at the past surgical site of cholecystectomy, or liver-related issues. - Order RUQ abdominal ultrasound to evaluate for abnormalities.  Intertrigo Recurrent rash in skin folds, particularly in the groin and abdominal areas, likely due to moisture and yeast. Antifungal cream is effective in managing symptoms. - Prescribe antifungal cream with multiple refills for flare-ups. - Instruct to apply cream twice daily and pat the area dry after showering before application.  Obesity Continued weight loss with a recent reduction of ten pounds. Currently on phentermine, which is not intended for long-term use due to potential side effects such as increased blood pressure and heart rate. - Refill phentermine prescription and reassess in three months. - Monitor weight loss and consider tapering off phentermine if weight loss plateaus.  Constipation Irregular bowel movements, potentially related to inadequate hydration. Adequate hydration is crucial for bowel regularity. - Advise increasing water intake to at least 64 ounces per day. - Consider using MiraLAX  if increased water intake does not alleviate symptoms.  Hearing Loss Difficulty hearing, particularly in crowds or with background noise. It has been years since the last audiogram. Hearing aids may be considered if audiogram indicates significant hearing loss. - Refer to audiologist for audiogram and potential discussion of hearing aids.  General Health Maintenance Up to date on most screenings but due for some vaccinations. A1c and lipid panel tests are overdue. - Order A1c and lipid panel tests. - Administer B12 shot and schedule follow-up in one month. - Advise on obtaining tetanus, pneumonia, and shingles vaccines at the pharmacy. - Encourage scheduling a mammogram if not already done. Diagnoses  and all orders for this visit:  Depression screening (Z13.31) -     Depression Screen -(PHQ- 2/9, BDI)  S/P gastric bypass -     Lipid Panel W/Reflex Direct Low Density Lipoprotein (LDL) Cholesterol; Future -     Hemoglobin A1C; Future  Routine general medical examination at health care facility  Candidal intertrigo -     clotrimazole (LOTRIMIN) 1 % cream; Apply topically 2 (two) times daily  Overweight -     phentermine (ADIPEX-P) 37.5 mg tablet; Take 1 tablet (37.5 mg total) by mouth every morning before breakfast  Abdominal pain, RUQ (right upper quadrant) -     US  abdomen  limited; Future  Decreased hearing of both ears -     Ambulatory Referral to Audiology     This visit was coded based on medical decision making (MDM).    Future Appointments     Date/Time Provider Department Center Visit Type   11/03/2023 10:00 AM Maree Jannett Hering, MD [facility] C RETURN VISIT   11/30/2023 10:00 AM (Arrive by 9:45 AM) NURSE [facility] NURSE VISIT   01/28/2024 9:45 AM (Arrive by 9:30 AM) Johnie Damien Dragon, PA [facility] OFFICE VISIT   02/16/2024 9:30 AM [facility] PFT [facility] C PFT   02/16/2024 10:00 AM Theotis Lavelle BRAVO, MD [facility] C RETURN VISIT       An after visit summary was provided for the patient either in written format or through MyChart.  This note has been created using automated tools and reviewed for accuracy by [facility]  ANNE FRANKLIN.

## 2024-02-21 ENCOUNTER — Encounter: Payer: Self-pay | Admitting: Internal Medicine

## 2024-03-02 ENCOUNTER — Encounter: Payer: Self-pay | Admitting: Internal Medicine

## 2024-03-09 ENCOUNTER — Ambulatory Visit
Attending: Student in an Organized Health Care Education/Training Program | Admitting: Student in an Organized Health Care Education/Training Program

## 2024-03-09 ENCOUNTER — Encounter: Payer: Self-pay | Admitting: Student in an Organized Health Care Education/Training Program

## 2024-03-09 VITALS — BP 104/79 | HR 70 | Temp 97.8°F | Resp 18 | Ht 68.0 in | Wt 175.0 lb

## 2024-03-09 DIAGNOSIS — G8929 Other chronic pain: Secondary | ICD-10-CM | POA: Diagnosis present

## 2024-03-09 DIAGNOSIS — M961 Postlaminectomy syndrome, not elsewhere classified: Secondary | ICD-10-CM | POA: Diagnosis present

## 2024-03-09 DIAGNOSIS — M5416 Radiculopathy, lumbar region: Secondary | ICD-10-CM | POA: Diagnosis not present

## 2024-03-09 NOTE — Progress Notes (Signed)
 PROVIDER NOTE: Interpretation of information contained herein should be left to medically-trained personnel. Specific patient instructions are provided elsewhere under Patient Instructions section of medical record. This document was created in part using AI and STT-dictation technology, any transcriptional errors that may result from this process are unintentional.  Patient: Lisa [name]  Service: E/M   PCP: [facility]  DOB: Nov 17, 1970  DOS: 03/09/2024  Provider: Wallie Sherry, MD  MRN: [id number]  Delivery: Face-to-face  Specialty: Interventional Pain Management  Type: Established Patient  Setting: Ambulatory outpatient facility  Specialty designation: 09  Referring Prov.: Johnie Perkins, PA-C  Location: Outpatient office facility       History of present illness (HPI) Lisa [name], a 53 y.o. year old female, is here today because of her Failed back surgical syndrome [M96.1]. Lisa [name] primary complain today is Back Pain (lower)    Pain Assessment: Severity of Chronic pain is reported as a 6 /10. Location: Back Lower/back of right calf. Onset: More than a month ago. Quality: Throbbing. Timing: Intermittent. Modifying factor(s): nothing. Vitals:  height is 5' 8 (1.727 m) and weight is 175 lb (79.4 kg). Her temporal temperature is 97.8 F (36.6 C). Her blood pressure is 104/79 and her pulse is 70. Her respiration is 18 and oxygen saturation is 97%.  BMI: Estimated body mass index is 26.61 kg/m as calculated from the following:   Height as of this encounter: 5' 8 (1.727 m).   Weight as of this encounter: 175 lb (79.4 kg).  Last encounter: 04/15/2023. Last procedure: Visit date not found.  Reason for encounter: Discussed SCS trial  Discussed the use of AI scribe software for clinical note transcription with the patient, who gave verbal consent to proceed.  History of Present Illness   Lisa [name] is a 53 year old female  with a history of spine surgeries who presents with continued back and leg pain.  She has experienced persistent back and leg pain since her last visit over a year ago, with deterioration noted in the middle back and neck. The pain is primarily on the right side, extending down to the bottom of the foot, and on the left side, extending to the knee. This distribution is similar to what was noted in October 2024.  She has not engaged in more physical therapy since her last visit, despite trying three different therapists last year without success. She has previously had injections. Her pain varies daily between her back and legs, and she believes the back pain may be causing the leg pain.  She has undergone a psychological evaluation, although it is unclear if it was specifically for the spinal cord stimulator trial. She was already seeing a psychologist at the time.        HPI from initial clinic visit: Lisa [name] is a pleasant 53 year old female status post 2 lumbar spine surgeries with a lumbar laminectomy who endorses chronic and severe low back pain with radiation down her right posterior lateral leg to the bottom of her foot and down her left leg to her left calf. She has attempted medication management, epidural steroid injections with physiatry as well as physical therapy with limited response. She was subsequently evaluated by neurosurgery and a spinal cord stimulator was recommended for her. When asking about her symptoms today, she states that her low back pain is more bothersome for her than her right leg pain. She is somewhat reluctant about spinal cord stimulation and wants to learn more about  it.    Constitutional: Denies any fever or chills Gastrointestinal: No reported hemesis, hematochezia, vomiting, or acute GI distress Musculoskeletal: Denies any acute onset joint swelling, redness, loss of ROM, or weakness Neurological: No reported episodes of acute onset apraxia, aphasia,  dysarthria, agnosia, amnesia, paralysis, loss of coordination, or loss of consciousness  Medication Review  Erenumab-aooe, Ipratropium-Albuterol , Rimegepant Sulfate, SUMAtriptan , acetaminophen , albuterol , apixaban , diazepam , gabapentin , hydrOXYzine , multivitamin, naproxen , oxyCODONE -acetaminophen , pantoprazole , phentermine, rizatriptan, topiramate , traZODone , valACYclovir , and warfarin  History Review  Allergy: Lisa [name] is allergic to lisinopril and morphine. Drug: Lisa [name]  reports no history of drug use. Alcohol:  reports that she does not currently use alcohol. Tobacco:  reports that she has never smoked. She has never used smokeless tobacco. Social: Lisa [name]  reports that she has never smoked. She has never used smokeless tobacco. She reports that she does not currently use alcohol. She reports that she does not use drugs. Medical:  has a past medical history of Abdominal pain, Anemia, Arthritis, Asthma, Bipolar 1 disorder (HCC), Blood in stool, Change in voice, Chest pain, Constipation, Depression, Diverticulitis, DVT (deep venous thrombosis) (HCC) (12/13/2014), Dysrhythmia, Fatigue, Gallstones (04/10/2011), GERD (gastroesophageal reflux disease), History of blood transfusion (02/2019), History of hiatal hernia, History of kidney stones, History of methicillin resistant staphylococcus aureus (MRSA), Hypertension, Idiopathic non-specific interstitial pneumonitis (HCC), Migraines, MRSA carrier, Nausea, Pancreatitis, Pre-diabetes, PTSD (post-traumatic stress disorder), Pulmonary embolism (HCC) (12/13/2014), Sleep apnea, Takes iron  supplements, Trouble swallowing, and Vocal cord polyps. Surgical: Lisa [name]  has a past surgical history that includes Nasal polyp surgery; Wisdom tooth extraction; Laparoscopic cholecystectomy w/ cholangiography (04/25/2011); Dilation and curettage of uterus; Esophagogastroduodenoscopy (06/02/2011); BRAVO ph study (06/02/2011); Tubal  ligation (1996); Endometrial ablation (N/A, 03/30/2019); Hysteroscopy (03/30/2019); Total laparoscopic hysterectomy with salpingectomy (Bilateral, 08/15/2019); Cystoscopy (08/15/2019); Colonoscopy; Gastric bypass (2021); Lumbar laminectomy/decompression microdiscectomy (N/A, 01/19/2022); Lumbar laminectomy for epidural abscess (N/A, 01/30/2022); and Back surgery. Family: family history includes Alcohol abuse in her father, paternal aunt, paternal grandfather, and paternal uncle; Bipolar disorder in her mother, niece, and sister; Cancer (age of onset: 0) in her paternal aunt; Diabetes in her mother; Lung cancer in her father; Pancreatic cancer in her mother.  Laboratory Chemistry Profile   Renal Lab Results  Component Value Date   BUN 9 04/20/2023   CREATININE 0.66 04/20/2023   GFRAA >60 08/16/2019   GFRNONAA >60 04/20/2023    Hepatic Lab Results  Component Value Date   AST 21 04/20/2023   ALT 23 04/20/2023   ALBUMIN 4.0 04/20/2023   ALKPHOS 50 04/20/2023   AMYLASE 97 04/24/2011   LIPASE 47 02/27/2017    Electrolytes Lab Results  Component Value Date   NA 137 04/20/2023   K 3.6 04/20/2023   CL 105 04/20/2023   CALCIUM 8.9 04/20/2023   MG 2.2 05/31/2019    Bone Lab Results  Component Value Date   TESTOFREE 1.1 03/03/2019   TESTOSTERONE  13 03/03/2019    Inflammation (CRP: Acute Phase) (ESR: Chronic Phase) Lab Results  Component Value Date   ESRSEDRATE 14 04/10/2014         Note: Above Lab results reviewed.  Recent Imaging Review  MR LUMBAR SPINE WO CONTRAST CLINICAL DATA:  Low back pain for over 6 weeks.  EXAM: MRI LUMBAR SPINE WITHOUT CONTRAST  TECHNIQUE: Multiplanar, multisequence MR imaging of the lumbar spine was performed. No intravenous contrast was administered.  COMPARISON:  08/11/2022  FINDINGS: Segmentation:  Standard.  Alignment:  Minimal grade 1 anterolisthesis of L4 on L5.  Vertebrae: No  acute fracture, evidence of discitis, or aggressive bone  lesion.  Conus medullaris and cauda equina: Conus extends to the L1 level. Conus and cauda equina appear normal.  Paraspinal and other soft tissues: No acute paraspinal abnormality.  Disc levels:  Disc spaces: Disc desiccation at L5-S1. Disc heights are maintained.  T12-L1: No significant disc bulge. Moderate bilateral facet arthropathy. No foraminal or central canal stenosis.  L1-L2: Mild broad-based disc bulge. Moderate bilateral facet arthropathy. No foraminal or central canal stenosis.  L2-L3: Mild disc bulge. Moderate bilateral facet arthropathy. Bilateral mild foraminal stenosis. Mild-moderate spinal stenosis.  L3-L4: Mild disc bulge. Moderate bilateral facet arthropathy. Moderate spinal stenosis. Mild bilateral foraminal stenosis.  L4-L5: Moderate disc bulge. Severe bilateral facet arthropathy. Severe spinal stenosis. Mild left and moderate right foraminal stenosis.  L5-S1: Mild disc bulge with a small central annular fissure. Mild bilateral facet arthropathy. Mild right foraminal stenosis. Moderate left foraminal stenosis. No central canal stenosis.  IMPRESSION: 1. At L4-5 there is a moderate disc bulge. Severe bilateral facet arthropathy. Severe spinal stenosis. Mild left and moderate right foraminal stenosis. 2. At L3-4 there is a mild disc bulge. Moderate bilateral facet arthropathy. Moderate spinal stenosis. Mild bilateral foraminal stenosis. 3. At L2-3 there is a mild disc bulge. Moderate bilateral facet arthropathy. Bilateral mild foraminal stenosis. Mild-moderate spinal stenosis. 4. At L5-S1 there is a mild disc bulge with a small central annular fissure. Mild bilateral facet arthropathy. Mild right foraminal stenosis. Moderate left foraminal stenosis. 5. No acute osseous injury of the lumbar spine.  Electronically Signed   By: Julaine Blanch M.D.   On: 05/17/2023 13:49 MR THORACIC SPINE WO CONTRAST CLINICAL DATA:  Upper back pain for years radiating to  both arms  EXAM: MRI THORACIC SPINE WITHOUT CONTRAST  TECHNIQUE: Multiplanar, multisequence MR imaging of the thoracic spine was performed. No intravenous contrast was administered.  COMPARISON:  10/14/2022  FINDINGS: Alignment:  Physiologic.  Vertebrae: No acute fracture, evidence of discitis, or aggressive bone lesion.  Cord:  Normal signal and morphology.  Paraspinal and other soft tissues: No acute paraspinal abnormality.  Disc levels:  Disc spaces:  Disc spaces are maintained.  T1-T2: No disc protrusion, foraminal stenosis or central canal stenosis.  T2-T3: No disc protrusion, foraminal stenosis or central canal stenosis.  T3-T4: No disc protrusion, foraminal stenosis or central canal stenosis.  T4-T5: No disc protrusion, foraminal stenosis or central canal stenosis.  T5-T6: No disc protrusion, foraminal stenosis or central canal stenosis.  T6-T7: No disc protrusion, foraminal stenosis or central canal stenosis.  T7-T8: Mild disc bulge eccentric towards the left. No foraminal or central canal stenosis.  T8-T9: No disc protrusion, foraminal stenosis or central canal stenosis.  T9-T10: No disc protrusion, foraminal stenosis or central canal stenosis.  T10-T11: No disc protrusion, foraminal stenosis or central canal stenosis.  T11-T12: No disc protrusion, foraminal stenosis or central canal stenosis.  IMPRESSION: 1. No acute osseous injury of the thoracic spine. 2. No significant thoracic spine disc protrusion, foraminal stenosis or central canal stenosis.  Electronically Signed   By: Julaine Blanch M.D.   On: 05/17/2023 13:43 Note: Reviewed        Physical Exam  Vitals: BP 104/79 (Cuff Size: Normal)   Pulse 70   Temp 97.8 F (36.6 C) (Temporal)   Resp 18   Ht 5' 8 (1.727 m)   Wt 175 lb (79.4 kg)   LMP 07/17/2019 (Approximate)   SpO2 97%   BMI 26.61 kg/m  BMI: Estimated body mass  index is 26.61 kg/m as calculated from the following:    Height as of this encounter: 5' 8 (1.727 m).   Weight as of this encounter: 175 lb (79.4 kg). Ideal: Ideal body weight: 63.9 kg (140 lb 14 oz) Adjusted ideal body weight: 70.1 kg (154 lb 8.4 oz) General appearance: Well nourished, well developed, and well hydrated. In no apparent acute distress Mental status: Alert, oriented x 3 (person, place, & time)       Respiratory: No evidence of acute respiratory distress Eyes: PERLA Lumbar Spine Area Exam  Skin & Axial Inspection: Well healed scar from previous spine surgery detected Alignment: Symmetrical Functional ROM: Pain restricted ROM affecting both sides, right greater than left Stability: No instability detected Muscle Tone/Strength: Functionally intact. No obvious neuro-muscular anomalies detected. Sensory (Neurological): Dermatomal pain pattern Palpation: No palpable anomalies       Provocative Tests: Hyperextension/rotation test: deferred today       Lumbar quadrant test (Kemp's test): (+) on the right for foraminal stenosis Lateral bending test: (+) due to pain.   Gait & Posture Assessment  Ambulation: Unassisted Gait: Relatively normal for age and body habitus Posture: WNL  Lower Extremity Exam      Side: Right lower extremity   Side: Left lower extremity  Stability: No instability observed           Stability: No instability observed          Skin & Extremity Inspection: Edema   Skin & Extremity Inspection: Edema  Functional ROM: Unrestricted ROM                   Functional ROM: Unrestricted ROM                  Muscle Tone/Strength: Functionally intact. No obvious neuro-muscular anomalies detected.   Muscle Tone/Strength: Functionally intact. No obvious neuro-muscular anomalies detected.  Sensory (Neurological): Neurogenic pain pattern         Sensory (Neurological): Neurogenic pain pattern        DTR: Patellar: deferred today Achilles: deferred today Plantar: deferred today   DTR: Patellar: deferred today Achilles:  deferred today Plantar: deferred today  Palpation: No palpable anomalies   Palpation: No palpable anomalies     Assessment   Diagnosis Status  1. Failed back surgical syndrome   2. Lumbar post-laminectomy syndrome   3. Chronic radicular lumbar pain    Having a Flare-up Having a Flare-up Having a Flare-up   Updated Problems: No problems updated.  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 Previous spine surgeries have not alleviated symptoms. She is not a surgical candidate nor does she desire any additional lumbar spine surgery based upon undesired outcomes with previous ones.  The patient presents with chronic, intractable pain involving both mechanical low back pain and neurogenic components radiating into the lower extremities. Given the patient's history of failed back surgery syndrome and radiculopathy, and limited response to conservative and interventional therapies, a spinal cord stimulator (SCS) trial is being considered. While SCS is typically more effective for neuropathic and appendicular pain, we discussed that some patients may also experience relief in axial low back and hip pain. The potential benefits and risks of spinal cord stimulation were thoroughly reviewed.  The proposed plan  includes a percutaneous spinal cord stimulator trial. The patient was informed that this will involve temporary placement of epidural leads connected to an external pulse generator, which will be used over a 7-day trial period. We discussed the possibility of a mid-trial in-office visit to adjust settings and optimize programming in order to give the patient the best chance of success. The patient will receive daily support from the device representative throughout the trial.  We  reviewed the benefits of SCS, which include potential substantial pain relief, reduction in the use of oral pain medications including opioids, and long-term programmable therapy that can reduce reliance on repeated injections and other pain interventions. Risks were also discussed in detail and include potential surgical complications such as infection, bleeding, CSF leak, lead migration or fracture, hardware malfunction, and the possibility of either no pain relief or worsening of symptoms. The patient was advised that spinal cord stimulation is not a guaranteed solution or a "magic bullet," but rather a potentially valuable therapy in appropriately selected cases.  As part of standard protocol, the patient will require a comprehensive psychosocial and behavioral evaluation prior to the trial. A referral was placed to [facility], and the patient was instructed to have the results faxed to our clinic prior to scheduling the procedure. We had a thorough and detailed discussion reviewing the rationale, alternatives, risks, and expected outcomes. The patient stated that all questions were answered to their satisfaction, demonstrated appropriate understanding, and expressed readiness to proceed. There were no barriers to understanding the treatment plan, and the explanation was well received. The patient is eager to move forward with the spinal cord stimulator trial once the necessary evaluation is complete.        Ms. Jung Ingerson [name] has a current medication list which includes the following long-term medication(s): albuterol , apixaban , pantoprazole , rizatriptan, topiramate , trazodone , gabapentin , and sumatriptan .  Pharmacotherapy (Medications Ordered): No orders of the defined types were placed in this encounter.  Orders:  Orders Placed This Encounter  Procedures   [facility] TRIAL    Contact medical implant company representative to notify them of the scheduled case and to make sure they will be  available to provide required equipment.    Standing Status:   Future    Expected Date:   04/03/2024    Expiration Date:   03/09/2025    Scheduling Instructions:     Side: Bilateral     Level: Lumbar     Device: Medtronic     Sedation: With sedation     Timeframe: As soon as pre-approved    Where will this procedure be performed?:   [facility]   Ambulatory referral to Psychology    Referral Priority:   Routine    Referral Type:   Psychiatric    Referral Reason:   Specialty Services Required    Requested Specialty:   Psychology    Number of Visits Requested:   1    Return in about 25 days (around 04/03/2024) for Medtronic SCS trial, ECT.    Recent Visits No visits were found meeting these conditions. Showing recent visits within past 90 days and meeting all other requirements Today's Visits Date Type Provider Dept  03/09/24 Office Visit Marcelino Nurse, MD [facility]  Showing today's visits and meeting all other requirements Future Appointments No visits were found meeting these conditions. Showing future appointments within next 90 days and meeting all other requirements  I discussed the assessment and treatment plan with the patient. The patient was  provided an opportunity to ask questions and all were answered. The patient agreed with the plan and demonstrated an understanding of the instructions.  Patient advised to call back or seek an in-person evaluation if the symptoms or condition worsens.  Duration of encounter: .  Total time on encounter, as per AMA guidelines included both the face-to-face and non-face-to-face time personally spent by the physician and/or other qualified health care professional(s) on the day of the encounter (includes time in activities that require the physician or other qualified health care professional and does not include time in activities normally performed by clinical staff). Physician's time may include the following  activities when performed: Preparing to see the patient (e.g., pre-charting review of records, searching for previously ordered imaging, lab work, and nerve conduction tests) Review of prior analgesic pharmacotherapies. Reviewing PMP Interpreting ordered tests (e.g., lab work, imaging, nerve conduction tests) Performing post-procedure evaluations, including interpretation of diagnostic procedures Obtaining and/or reviewing separately obtained history Performing a medically appropriate examination and/or evaluation Counseling and educating the patient/family/caregiver Ordering medications, tests, or procedures Referring and communicating with other health care professionals (when not separately reported) Documenting clinical information in the electronic or other health record Independently interpreting results (not separately reported) and communicating results to the patient/ family/caregiver Care coordination (not separately reported)  Note by: Wallie Sherry, MD (TTS and AI technology used. I apologize for any typographical errors that were not detected and corrected.) Date: 03/09/2024; Time: 11:55 AM

## 2024-03-09 NOTE — Progress Notes (Signed)
 Safety precautions to be maintained throughout the outpatient stay will include: orient to surroundings, keep bed in low position, maintain call bell within reach at all times, provide assistance with transfer out of bed and ambulation.

## 2024-03-09 NOTE — Patient Instructions (Addendum)

## 2024-03-20 ENCOUNTER — Encounter: Payer: Self-pay | Admitting: Internal Medicine

## 2024-03-22 ENCOUNTER — Ambulatory Visit

## 2024-03-22 DIAGNOSIS — Z113 Encounter for screening for infections with a predominantly sexual mode of transmission: Secondary | ICD-10-CM

## 2024-03-22 LAB — WET PREP FOR TRICH, YEAST, CLUE
Clue Cell Exam: NEGATIVE
Trichomonas Exam: NEGATIVE
Yeast Exam: NEGATIVE

## 2024-03-22 LAB — HM HIV SCREENING LAB: HM HIV Screening: NEGATIVE

## 2024-03-22 NOTE — Progress Notes (Signed)
 [facility] Department STI clinic [address] [place] [address] Main phone: [phone]  STI screening visit  Subjective:  Lisa [name] is a 53 y.o. female being seen today for an STI screening visit. The patient reports they do have symptoms.  Patient reports that they are post-menopausal.  Patient's last menstrual period was 07/17/2019 (approximate).  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) 04/16/2022   Bipolar 1 disorder, depressed, moderate (HCC) 02/18/2022   GAD (generalized anxiety disorder) 02/18/2022   Cauda equina compression (HCC) 01/30/2022   Chronic radicular lumbar pain 01/19/2022   S/P gastric bypass 06/02/2021   S/P hysterectomy 08/15/2019   S/P laparoscopic hysterectomy 08/15/2019   Chronic constipation 03/21/2019   Menorrhagia 03/21/2019   Migraine 03/21/2019   Vocal cord polyps 03/21/2019   Acute deep vein thrombosis (DVT) of popliteal vein of right lower extremity (HCC) 03/15/2019   Iron  deficiency anemia due to chronic blood loss 03/15/2019   Symptomatic anemia 03/03/2019   Asthma without status asthmaticus 08/10/2017   Depression 08/10/2017   Obesity, Class III, BMI 40-49.9 (morbid obesity) 08/10/2017   Prediabetes 04/19/2017   Essential hypertension 04/16/2017   DVT (deep venous thrombosis) (HCC) 12/13/2014   Pulmonary embolism (HCC) 12/13/2014   Nonspecific interstitial pneumonia (HCC) 08/22/2014   Cough, persistent 04/30/2014   Obstructive apnea 04/30/2014   Endometrial polyp 09/30/2013   High BMI 08/25/2013   Dysmenorrhea 08/25/2013   Abdominal bloating 08/25/2013   Schizoaffective disorder (HCC) 05/12/2013   PTSD (post-traumatic stress disorder)  05/12/2013   Bipolar I disorder, most recent episode depressed (HCC) 05/12/2013   Abdominal pain, generalized 05/28/2011   Esophageal reflux 05/28/2011   Chief Complaint  Patient presents with   SEXUALLY TRANSMITTED DISEASE   HPI Patient reports desire for asymptomatic STI testing. No contacts. No concerns.  See flowsheet for further details and programmatic requirements Hyperlink available at the top of the signed note in blue.  Flow sheet content below:  Pregnancy Intention Screening Does the patient want to become pregnant in the next year?: No Does the patient's partner want to become pregnant in the next year?: No Would the patient like to discuss contraceptive options today?: No STD Symptoms Denies all: No Genital Itching: No Lower abdominal pain: Yes (lower abdominal pressure) Discharge: No Dysuria: No Genital ulcer / lesion: No Rash: No Vaginal irritation: No Oral / Other skin ulcer: No Pain with sex: Yes (after sex) Sore Throat: No Visual Changes: No Vaginal Bleeding: No Risk Factors for Hep B Household, sexual, or needle sharing contact of a person infected with Hep B: No Sexual contact with a person who uses drugs not as prescribed?: No Currently or Ever used drugs not as prescribed: No HIV Positive: No PRep Patient: No Men who have sex with men: No Have Hepatitis C: No History of Incarceration: No History of Homeslessness?: No Anal sex following anal drug use?: No Risk Factors for Hep C Currently using drugs not as prescribed: No Sexual partner(s) currently using drugs as not prescribed: No History of drug use: No HIV Positive: No People with a history of incarceration: No People born between the years of 4 and 72: No Abuse History Has patient ever been abused physically?: No Has patient ever been abused sexually?: No Does patient  feel they have a problem with Anxiety?: No Does patient feel they have a problem with Depression?:  No Counseling Patient counseled to use condoms with all sex: Condoms declined RTC in 2-3 weeks for test results: Yes Clinic will call if test results abnormal before test result appt.: Yes Test results given to patient Patient counseled to use condoms with all sex: Condoms declined   Screening for MPX risk: Does the patient have an unexplained rash? No Is the patient MSM? No Does the patient endorse multiple sex partners or anonymous sex partners? No Did the patient have close or sexual contact with a person diagnosed with MPX? No Has the patient traveled outside the [place]  where MPX is endemic? No Is there a high clinical suspicion for MPX-- evidenced by one of the following No  -Unlikely to be chickenpox  -Lymphadenopathy  -Rash that present in same phase of evolution on any given body part  Screenings: Last HIV test per patient/review of record was  Lab Results  Component Value Date   HMHIVSCREEN Negative - Validated 01/15/2022    Lab Results  Component Value Date   HIV NON REACTIVE 08/25/2013     Last HEPC test per patient/review of record was  Lab Results  Component Value Date   HMHEPCSCREEN Negative-Validated 01/15/2022   No components found for: HEPC   Last HEPB test per patient/review of record was No components found for: HMHEPBSCREEN   Patient reports last pap was:   Lab Results  Component Value Date   SPECADGYN  08/25/2013     Comment:     SATISFACTORY.  Endocervical/transformation zone component present.   Result Date Procedure Results Follow-ups  08/15/2019 Surgical pathology SURGICAL PATHOLOGY: SURGICAL PATHOLOGY CASE: [id number] PATIENT: Lisa [name] Surgical Pathology Report     Specimen Submitted: A. Uterus with cervix, bilateral tubes  Clinical History: Abnormal uterine bleeding N93.9      DIAGNOSIS: A. UTERUS A...   03/14/2019 Cytology - PAP Adequacy: Satisfactory for evaluation  endocervical/transformation zone component  PRESENT. Diagnosis: NEGATIVE FOR INTRAEPITHELIAL LESIONS OR MALIGNANCY. Material Submitted: CervicoVaginal Pap [ThinPrep Imaged] CYTOLOGY - PAP: PAP RESULT   08/25/2013 Pap IG w/ reflex to HPV when ASC-U      Immunization history:  Immunization History  Administered Date(s) Administered   Influenza,inj,Quad PF,6+ Mos 05/13/2013   Pneumococcal Polysaccharide-23 05/13/2013   Tdap 11/01/2010    The following portions of the patient's history were reviewed and updated as appropriate: allergies, current medications, past medical history, past social history, past surgical history and problem list.  Objective:  There were no vitals filed for this visit.  Physical Exam Vitals and nursing note reviewed. Chaperone present: declined chaperone due to only female available.  Constitutional:      Appearance: Normal appearance.  HENT:     Head: Normocephalic and atraumatic.     Mouth/Throat:     Mouth: Mucous membranes are moist.     Pharynx: Oropharynx is clear. No oropharyngeal exudate or posterior oropharyngeal erythema.  Pulmonary:     Effort: Pulmonary effort is normal.  Abdominal:     General: Abdomen is flat.  Genitourinary:    General: Normal vulva.     Exam position: Lithotomy position.     Pubic Area: No rash or pubic lice.      Labia:        Right: No rash or lesion.        Left: No rash or lesion.      Vagina: Normal. No vaginal discharge,  erythema, bleeding or lesions.     Cervix: No cervical motion tenderness, discharge, friability, lesion or erythema.     Uterus: Normal.      Adnexa: Right adnexa normal and left adnexa normal.     Rectum: Normal.     Comments: pH = <4.5 Lymphadenopathy:     Head:     Right side of head: No preauricular or posterior auricular adenopathy.     Left side of head: No preauricular or posterior auricular adenopathy.     Cervical: No cervical adenopathy.     Upper Body:     Right upper body: No supraclavicular, axillary or epitrochlear  adenopathy.     Left upper body: No supraclavicular, axillary or epitrochlear adenopathy.     Lower Body: No right inguinal adenopathy. No left inguinal adenopathy.  Skin:    General: Skin is warm and dry.     Findings: No rash.  Neurological:     Mental Status: She is alert and oriented to person, place, and time.    Assessment and Plan:  Raine Blodgett is a 53 y.o. female presenting to the [facility] Department for STI screening  1. Screening for venereal disease (Primary)  - Chlamydia/Gonorrhea [place] [facility] - WET PREP FOR TRICH, YEAST, CLUE - Gonococcus culture - HIV [place] [facility] - Syphilis Serology, [place] [facility]   Patient accepted the following screenings: oral GC culture, vaginal CT/GC swab, vaginal wet prep, HIV, and RPR Patient meets criteria for HepB screening? No. Ordered? no Patient meets criteria for HepC screening? No. Ordered? no  Treat wet prep per standing order Discussed time line for [facility] results and that patient will be called with positive results and encouraged patient to call if she had not heard in 2 weeks.  Counseled to return or seek care for continued or worsening symptoms Recommended repeat testing in 3 months with positive results. Recommended condom use with all sex for STI prevention.   No follow-ups on file.  No future appointments.  Damien FORBES Satchel, NP

## 2024-03-22 NOTE — Progress Notes (Signed)
 Pt is here for STD visit. Wet prep results reviewed with patient and requires no treatment per provider. Condoms declined. Wilkie Drought, RN.

## 2024-03-26 LAB — GONOCOCCUS CULTURE

## 2024-04-05 ENCOUNTER — Encounter: Payer: Self-pay | Admitting: Student in an Organized Health Care Education/Training Program

## 2024-04-11 ENCOUNTER — Telehealth: Payer: Self-pay | Admitting: *Deleted

## 2024-04-11 NOTE — Telephone Encounter (Signed)
 Notified patient of SCS trial 05/03/24 at 0830. Informed not to eat anything after midnight. Have a driver with her for appointment 2-3 hours. Stop eloquis as instructed. She states 5 days. Use hibiclens  to wash back the night before and morning of procedure. Verbalizes understanding.

## 2024-04-28 NOTE — Progress Notes (Signed)
 Chief Complaint:   Chief Complaint  Patient presents with  . Weight follow up  . Discuss b12 injection    Subjective:   Lisa [name] is a 53 y.o. female in today for: HPI:  Weight Issues Follow Up:  Patient presents for discussion regarding weight loss.  Current BMI: Body mass index is 28.66 kg/m.  Wt Readings from Last 5 Encounters:  04/28/24 83 kg (183 lb)  04/20/24 83.1 kg (183 lb 3.2 oz)  02/16/24 82.4 kg (181 lb 10.5 oz)  02/16/24 82.4 kg (181 lb 9.6 oz)  01/28/24 80.1 kg (176 lb 9.6 oz)   Weight changes: down 45 lbs overall since 10/2022 Patient perspective on this problem: going well Current weight loss medication: phentermine 37.5 mg daily, Topamax  50 mg daily (not taking currently) Exercise: limited by chronic back pain   Initial hx 08/27/22 Patient her psychiatrist yesterday and expressed her concerns about gaining weight due to her Abilify  and Lamictal , which she takes for her bipolar 1 disorder.  She has been back on these medications for a few months and has noticed approximately a 20 pound weight gain.  Appetite has increased significantly, she wakes up wanting to eat and is constantly grazing through the day.  She always feels hungry.  Her psychiatrist suggested that she see her PCP for medication to counteract her appetite per patient.  Patient has a history of bariatric surgery.  The weight gain is extremely bothersome to her and she does not want to get back to the weight she was prior to bariatric surgery.  She reports that she would rather stop her Abilify  than continue with weight gain. Patient has no personal or family history of thyroid cancer or M EN 2.  She does report a remote history of gallstone induced pancreatitis in 2012 -her gallbladder was removed and symptoms resolved. - Transitioned from Lamictal  to Topamax  with psychiatry - Psychiatry okay with starting phentermine - Phentermine and Topamax  going well, has dry mouth but  manageable  History of Present Illness Lisa [name] is a 53 year old female who presents for follow-up on weight management and back pain treatment.  She has been on phentermine + topamax  for over a year, starting in April of the previous year, and has lost over 45 pounds since beginning the medication. The medication effectively controls her appetite, although there are moments when it is less effective. She continues to take phentermine but reports stopping topamax  a month ago due to missing a follow up appoitnment. Her history of pancreatitis limits her options for weight management medications.  She has a history of back pain and is scheduled for a trial of a spinal cord stimulator next Wednesday to address pain in the lower half of her back. She is concerned about potential side effects of the device, particularly regarding changes in vaginal lubrication, but has not experienced these issues before. She hopes the stimulator will allow her to be more physically active.  It has been about a year since her last B12 test and injections. She is not currently on B12 injections. Her previous lab work, including cholesterol and A1c, showed no issues with  cholesterol levels and resolution of prediabetes.  She is currently using Ajovy and Ubrelvy for migraine management, which have been effective. She is not taking Topamax  for migraines at this time but is considering it for weight management.   Lab Results  Component Value Date   HGBA1C 5.5 10/29/2023   HGBA1C 5.4 04/30/2022   Last Lipids:  Lab Results  Component Value Date   CHOLTOTAL 154 10/29/2023   LDLCALC 89 10/29/2023   HDL 57 10/29/2023   TRIG 40 10/29/2023    Patient Active Problem List  Diagnosis  . Persistent cough  . History of pulmonary embolism  . Vocal cord polyps  . Migraine  . Esophageal reflux  . Depression, unspecified  . Chronic constipation  . Asthma without status asthmaticus, unspecified  . NSIP  (nonspecific interstitial pneumonia) (CMS/HHS-HCC)  . OSA on CPAP  . Schizoaffective disorder (CMS/HHS-HCC)  . Bipolar 1 disorder (CMS/HHS-HCC)  . PTSD (post-traumatic stress disorder)  . Prediabetes  . History of deep venous thrombosis  . Iron  deficiency anemia due to chronic blood loss  . Class 1 obesity due to excess calories with serious comorbidity and body mass index (BMI) of 31.0 to 31.9 in adult  . Acquired absence of both cervix and uterus  . S/P gastric bypass  . Bipolar 1 disorder, depressed, moderate (CMS/HHS-HCC)  . Lumbar radiculopathy  . GAD (generalized anxiety disorder)  . Insomnia due to medical condition  . Vitamin B12 deficiency  . Lumbar post-laminectomy syndrome  . DOE (dyspnea on exertion)    Outpatient Medications Prior to Visit  Medication Sig Dispense Refill  . acyclovir (ZOVIRAX) 400 MG tablet Take 1 tablet (400 mg total) by mouth 2 (two) times daily 180 tablet 3  . AIMOVIG AUTOINJECTOR 140 mg/mL AtIn Inject 140 mLs (19,600 mg total) into the muscle monthly 140 mL 11  . apixaban  (ELIQUIS ) 5 mg tablet Take 1 tablet (5 mg total) by mouth every 12 (twelve) hours 60 tablet 6  . ARIPiprazole  (ABILIFY ) 2 MG tablet Take by mouth    . clotrimazole (LOTRIMIN) 1 % cream Apply topically 2 (two) times daily 45 g 11  . fluticasone-umeclidinium-vilanterol (TRELEGY ELLIPTA) 100-62.5-25 mcg inhaler Inhale 1 Puff into the lungs once daily 1 each 4  . hydrOXYzine  (ATARAX ) 25 MG tablet TAKE 1 TABLET BY MOUTH TWICE DAILY AS NEEDED FOR ANXIETY AND FOR SLEEP    . multivitamin/iron /folic acid (COMPLETE MULTIVITAMIN-MINERAL ORAL) Take 1 tablet by mouth once daily    . pantoprazole  (PROTONIX ) 40 MG DR tablet Take 1 tablet (40 mg total) by mouth once daily 100 tablet 3  . ubrogepant (UBRELVY) 100 mg Tab Take 100 mg by mouth as directed START WITH ONE-HALF TABLET BY MOUTH AS NEEDED. CAN REPEAT DOSE IN 1 TO 2 HOURS IF NEEDED. 10 tablet 5  . VENTOLIN  HFA 90 mcg/actuation inhaler INHALE  2 PUFFS BY MOUTH EVERY 6 HOURS AS NEEDED FOR WHEEZING 18 g 0  . phentermine (ADIPEX-P) 37.5 mg tablet Take 1 tablet (37.5 mg total) by mouth every morning before breakfast 30 tablet 2  . azaTHIOprine  (IMURAN ) 50 mg tablet Take 1 tablet by mouth once daily (Patient not taking: Reported on 02/16/2024) 30 tablet 0  . amoxicillin -clavulanate (AUGMENTIN ) 875-125 mg tablet Take 1 tablet (875 mg total) by mouth every 12 (twelve) hours for 10 days (Patient not taking: Reported on 04/28/2024) 20 tablet 0  . predniSONE  (DELTASONE ) 50 MG tablet Take 1 tablet (50 mg total) by mouth once daily (Patient not taking: Reported on 04/28/2024) 7 tablet 0   No facility-administered medications prior to visit.    Objective:   Vitals:   04/28/24 0955  BP: 95/65  Pulse: 60  Weight: 83 kg (183 lb)  PainSc: 0-No pain   Body mass index is 28.66 kg/m. Home Vitals:     Physical Exam Constitutional:  Appearance: Normal appearance.  HENT:     Head: Normocephalic and atraumatic.  Eyes:     Pupils: Pupils are equal, round, and reactive to light.  Cardiovascular:     Rate and Rhythm: Normal rate.  Pulmonary:     Effort: Pulmonary effort is normal.  Skin:    General: Skin is warm and dry.  Neurological:     General: No focal deficit present.     Mental Status: She is alert and oriented to person, place, and time.  Psychiatric:        Mood and Affect: Mood normal.        Behavior: Behavior normal.     Assessment/Plan:   Assessment & Plan Lumbar radiculopathy Chronic low back pain with lumbar radiculopathy. Scheduled for a trial of a spinal cord stimulator next Wednesday to address lower back pain. Focus is on lower back pain despite deterioration in the upper back as well. No expected changes in vaginal lubrication or other sexual functions from the stimulator. - Proceed with the trial of the spinal cord stimulator to assess its effectiveness in managing lower back pain. - Monitor the outcome of  the trial to determine if a long-term spinal cord stimulator is warranted.  Obesity Obesity management with phentermine + topamax  has been effective, resulting in a weight loss of over 45 pounds since April of last year. She has reached a plateau, which is common with phentermine use. Her BMI is no longer in the obese range. Pancreatitis limits the use of certain injectable weight loss medications. She stopped topamax  due to missing a follow up appointment, but was tolerating it well. - Continue phentermine for weight management. Attempt to refill for six months; if not possible, refill for three months and have covering provider refill while provider is out. - Reintroduce Topamax  to assist with weight loss in conjunction with phentermine. - Encourage increased physical activity, pending improvement in back pain management.  Vitamin B12 deficiency Vitamin B12 levels have not been checked in a year. She previously received B12 injections. - Order B12 level test to assess current status. - If B12 levels are low, resume B12 injections.   Diagnoses and all orders for this visit:  Overweight -     phentermine (ADIPEX-P) 37.5 mg tablet; Take 1 tablet (37.5 mg total) by mouth every morning before breakfast  Prediabetes -     Comprehensive Metabolic Panel (CMP); Future -     phentermine (ADIPEX-P) 37.5 mg tablet; Take 1 tablet (37.5 mg total) by mouth every morning before breakfast  Vitamin B12 deficiency -     Vitamin B12; Future  Migraine with aura and without status migrainosus, not intractable -     topiramate  (TOPAMAX ) 50 MG tablet; Take 1/2 tablet (25 mg) at nighttime for 2 weeks, then increase to 1 full tablet (50 mg) at nighttime.  Lumbar radiculopathy     This visit was coded based on medical decision making (MDM).  Follow-up in 3 months for CPE/weight or sooner as needed  Future Appointments     Date/Time Provider Department Center Visit Type   06/28/2024 10:00 AM Theotis Lavelle BRAVO, MD [facility] C RETURN VISIT   07/18/2024 10:15 AM Evern Allyson Hacker, NP [facility] C RETURN VISIT       There are no Patient Instructions on file for this visit.  An after visit summary was provided for the patient either in written format (printed) or through MyChart.

## 2024-05-03 ENCOUNTER — Ambulatory Visit (HOSPITAL_BASED_OUTPATIENT_CLINIC_OR_DEPARTMENT_OTHER): Admitting: Student in an Organized Health Care Education/Training Program

## 2024-05-03 ENCOUNTER — Encounter: Payer: Self-pay | Admitting: Student in an Organized Health Care Education/Training Program

## 2024-05-03 ENCOUNTER — Ambulatory Visit
Admission: RE | Admit: 2024-05-03 | Discharge: 2024-05-03 | Disposition: A | Discharge status: Home / Self Care | Source: Ambulatory Visit | Attending: Student in an Organized Health Care Education/Training Program | Admitting: Student in an Organized Health Care Education/Training Program

## 2024-05-03 DIAGNOSIS — M5416 Radiculopathy, lumbar region: Secondary | ICD-10-CM | POA: Insufficient documentation

## 2024-05-03 DIAGNOSIS — M961 Postlaminectomy syndrome, not elsewhere classified: Secondary | ICD-10-CM

## 2024-05-03 DIAGNOSIS — G8929 Other chronic pain: Secondary | ICD-10-CM | POA: Insufficient documentation

## 2024-05-03 MED ORDER — CEFAZOLIN SODIUM-DEXTROSE 2-4 GM/100ML-% IV SOLN
2.0000 g | INTRAVENOUS | Status: AC
  Administered 2024-05-03: 2 g via INTRAVENOUS
  Filled 2024-05-03: qty 100

## 2024-05-03 MED ORDER — MIDAZOLAM HCL 5 MG/5ML IJ SOLN
0.5000 mg | Freq: Once | INTRAMUSCULAR | Status: AC
  Administered 2024-05-03: 2 mg via INTRAVENOUS

## 2024-05-03 MED ORDER — LIDOCAINE HCL 2 % IJ SOLN
INTRAMUSCULAR | Status: AC
  Filled 2024-05-03: qty 20

## 2024-05-03 MED ORDER — CEFAZOLIN SODIUM 1 G IJ SOLR
INTRAMUSCULAR | Status: AC
Start: 2024-05-03 — End: 2024-05-03
  Filled 2024-05-03: qty 20

## 2024-05-03 MED ORDER — LACTATED RINGERS IV SOLN: Freq: Once | INTRAVENOUS | Status: AC

## 2024-05-03 MED ORDER — SODIUM CHLORIDE (PF) 0.9 % IJ SOLN
INTRAMUSCULAR | Status: AC
  Filled 2024-05-03: qty 10

## 2024-05-03 MED ORDER — ROPIVACAINE HCL 2 MG/ML IJ SOLN
9.0000 mL | Freq: Once | INTRAMUSCULAR | Status: AC
  Administered 2024-05-03: 9 mL via PERINEURAL

## 2024-05-03 MED ORDER — LIDOCAINE HCL 2 % IJ SOLN
20.0000 mL | Freq: Once | INTRAMUSCULAR | Status: AC
  Administered 2024-05-03: 400 mg

## 2024-05-03 MED ORDER — FENTANYL CITRATE (PF) 100 MCG/2ML IJ SOLN
25.0000 ug | INTRAMUSCULAR | Status: DC | PRN
  Administered 2024-05-03: 75 ug via INTRAVENOUS

## 2024-05-03 MED ORDER — FENTANYL CITRATE (PF) 100 MCG/2ML IJ SOLN
INTRAMUSCULAR | Status: AC
  Filled 2024-05-03: qty 2

## 2024-05-03 MED ORDER — MIDAZOLAM HCL 5 MG/5ML IJ SOLN
INTRAMUSCULAR | Status: AC
  Filled 2024-05-03: qty 5

## 2024-05-03 MED ORDER — ROPIVACAINE HCL 2 MG/ML IJ SOLN
INTRAMUSCULAR | Status: AC
Start: 2024-05-03 — End: 2024-05-03
  Filled 2024-05-03: qty 20

## 2024-05-03 MED ORDER — CEPHALEXIN 500 MG PO CAPS: 500.0000 mg | ORAL_CAPSULE | Freq: Four times a day (QID) | ORAL | 0 refills | Status: AC

## 2024-05-03 NOTE — Progress Notes (Signed)
 Safety precautions to be maintained throughout the outpatient stay will include: orient to surroundings, keep bed in low position, maintain call bell within reach at all times, provide assistance with transfer out of bed and ambulation.

## 2024-05-03 NOTE — Progress Notes (Signed)
 PROVIDER NOTE: Interpretation of information contained herein should be left to medically-trained personnel. Specific patient instructions are provided elsewhere under Patient Instructions section of medical record. This document was created in part using STT-dictation technology, any transcriptional errors that may result from this process are unintentional.  Patient: Lisa [name] Type: Established DOB: May 11, 1971 MRN: [id number] PCP: [facility]  Service: Procedure DOS: 05/03/2024 Setting: Ambulatory Location: Ambulatory outpatient facility Delivery: Face-to-face Provider: Wallie Sherry, MD Specialty: Interventional Pain Management Specialty designation: 09 Location: Outpatient facility Ref. Prov.: Sherry Wallie, MD       Interventional Therapy   Primary Reason for Admission: Surgical management of chronic pain condition.   Procedure:              Type: Medtronic Trial Spinal Cord Neurostimulator Implant (Percutaneous, interlaminar, posterior epidural placement) Laterality: Bilateral (-50)  Level: Lumbar  Imaging: Fluoroscopic guidance Anesthesia: Local anesthesia (1-2% Lidocaine ) Sedation: Moderate Sedation                       DOS: 05/03/2024  Performed by: Wallie Sherry, MD  Purpose: Diagnostic. To determine if a permanent implant may be effective in controlling some or all of Lisa [name]'s chronic pain symptoms.  Rationale (medical necessity): procedure needed and proper for the diagnosis and/or treatment of Lisa [name]'s medical symptoms and needs. 1. Failed back surgical syndrome   2. Lumbar post-laminectomy syndrome   3. Chronic radicular lumbar pain    NAS-11 Pain score:   Pre-procedure: 5 /10   Post-procedure: 0-No pain/10     Target: Posterior epidural space over the dorsal columns of the spinal cord. Location: Posterior intraspinal canal Region: Thoracolumbar  Approach: Translaminar percutaneous  Type of procedure:  Surgical   Position / Prep / Materials:  Position: Prone  Prep solution: ChloraPrep (2% chlorhexidine  gluconate and 70% isopropyl alcohol) Prep Area: Entire Posterior Thoracolumbar Region  Materials:  Tray: Implant tray        Needle(s):  Type: Epidural  Gauge (G): 14  Length: Regular (10cm)  Qty: 2  H&P (Pre-op Assessment):  Lisa [name] is a 53 y.o. (year old), female patient, seen today for interventional treatment. She  has a past surgical history that includes Nasal polyp surgery; Wisdom tooth extraction; Laparoscopic cholecystectomy w/ cholangiography (04/25/2011); Dilation and curettage of uterus; Esophagogastroduodenoscopy (06/02/2011); BRAVO ph study (06/02/2011); Tubal ligation (1996); Endometrial ablation (N/A, 03/30/2019); Hysteroscopy (03/30/2019); Total laparoscopic hysterectomy with salpingectomy (Bilateral, 08/15/2019); Cystoscopy (08/15/2019); Colonoscopy; Gastric bypass (2021); Lumbar laminectomy/decompression microdiscectomy (N/A, 01/19/2022); Lumbar laminectomy for epidural abscess (N/A, 01/30/2022); and Back surgery.  Initial Vital Signs:  Pulse/EKG Rate: 65ECG Heart Rate: 60 (nsr) Temp: (!) 97.4 F (36.3 C) Resp: 16 BP: (!) 107/56 SpO2: 98 %  BMI: Estimated body mass index is 27.37 kg/m as calculated from the following:   Height as of this encounter: 5' 8 (1.727 m).   Weight as of this encounter: 180 lb (81.6 kg).  Risk Assessment: Allergies: Reviewed. She is allergic to lisinopril and morphine.  Allergy Precautions: None required Coagulopathies: Reviewed. None identified.  Blood-thinner therapy: None at this time Active Infection(s): Reviewed. None identified. Lisa [name] is afebrile  Site Confirmation: Lisa [name] was asked to confirm the procedure and laterality before marking the site, which she did. Procedure checklist: Completed Consent: Before the procedure and under the influence of no sedative(s), amnesic(s), or anxiolytics,  the patient was informed of the treatment options, risks and possible complications. To fulfill our ethical and legal obligations, as recommended  by the American Medical Association's Code of Ethics, I have informed the patient of my clinical impression; the nature and purpose of the treatment or procedure; the risks, benefits, and possible complications of the intervention; the alternatives, including doing nothing; the risk(s) and benefit(s) of the alternative treatment(s) or procedure(s); and the risk(s) and benefit(s) of doing nothing.  Lisa [name] was provided with information about the general risks and possible complications associated with most interventional procedures. These include, but are not limited to: failure to achieve desired goals, infection, bleeding, organ or nerve damage, allergic reactions, paralysis, and/or death.  In addition, she was informed of those risks and possible complications associated to this particular procedure, which include, but are not limited to: damage to the implant; failure to decrease pain; local, systemic, or serious CNS infections, intraspinal abscess with possible cord compression and paralysis, or life-threatening such as meningitis; intrathecal and/or epidural bleeding with formation of hematoma with possible spinal cord compression and permanent paralysis; organ damage; nerve injury or damage with subsequent sensory, motor, and/or autonomic system dysfunction, resulting in transient or permanent pain, numbness, and/or weakness of one or several areas of the body; allergic reactions, either minor or major life-threatening, such as anaphylactic or anaphylactoid reactions.  Furthermore, Lisa [name] was informed of those risks and complications associated with the medications. These include, but are not limited to: allergic reactions (i.e.: anaphylactic or anaphylactoid reactions); arrhythmia;  Hypotension/hypertension; cardiovascular collapse;  respiratory depression and/or shortness of breath; swelling or edema; medication-induced neural toxicity; particulate matter embolism and blood vessel occlusion with resultant organ, and/or nervous system infarction and permanent paralysis.  Finally, she was informed that Medicine is not an exact science; therefore, there is also the possibility of unforeseen or unpredictable risks and/or possible complications that may result in a catastrophic outcome. The patient indicated having understood very clearly. We have given the patient no guarantees and we have made no promises. Enough time was given to the patient to ask questions, all of which were answered to the patient's satisfaction. Lisa [name] has indicated that she wanted to continue with the procedure. Attestation: I, the ordering provider, attest that I have discussed with the patient the benefits, risks, side-effects, alternatives, likelihood of achieving goals, and potential problems during recovery for the procedure that I have provided informed consent. Date  Time: 05/03/2024  8:22 AM  Pre-Procedure Preparation:  Monitoring: As per clinic protocol. Respiration, ETCO2, SpO2, BP, heart rate and rhythm monitor placed and checked for adequate function Safety Precautions: Patient was assessed for positional comfort and pressure points before starting the procedure. Time-out: I initiated and conducted the Time-out before starting the procedure, as per protocol. The patient was asked to participate by confirming the accuracy of the Time Out information. Verification of the correct person, site, and procedure were performed and confirmed by me, the nursing staff, and the patient. Time-out conducted as per Joint Commission's Universal Protocol (UP.01.01.01). Time: 1000 Start Time: 1000 hrs.  Description/Narrative of Procedure:          Rationale (medical necessity): procedure needed and proper for the diagnosis and/or treatment of the  patient's medical symptoms and needs. Procedural Technique Safety Precautions: Aspiration looking for blood return was conducted prior to all injections. At no point did we inject any substances, as a needle was being advanced. No attempts were made at seeking any paresthesias. Safe injection practices and needle disposal techniques used. Medications properly checked for expiration dates. SDV (single dose vial) medications used. Description of the Procedure: Protocol  guidelines were followed. The patient was assisted into a comfortable position. The target area was identified and the area prepped in the usual manner. Skin & deeper tissues infiltrated with local anesthetic. Appropriate amount of time allowed to pass for local anesthetics to take effect. The procedure needles were then advanced to the target area. Proper needle placement secured. Negative aspiration confirmed. Solution injected in intermittent fashion, asking for systemic symptoms every 0.5cc of injectate. The needles were then removed and the area cleansed, making sure to leave some of the prepping solution back to take advantage of its long term bactericidal properties.  Technical description of procedure: Availability of a responsible, adult driver, and NPO status confirmed. Informed consent was obtained after having discussed risks and possible complications. An IV was started. The patient was then taken to the fluoroscopy suite, where the patient was placed in position for the procedure, over the fluoroscopy table. The patient was then monitored in the usual manner. Fluoroscopy was manipulated to obtain the best possible view of the target. Parallex error was corrected before commencing the procedure. Once a clear view of the target had been obtained, the skin and deeper tissues over the procedure site were infiltrated using lidocaine , loaded in a 10 cc luer-loc syringe with a 0.5 inch, 25-G needle. The introducer needle(s) was/were then  inserted through the skin and deeper tissues. A paramidline approach was used to enter the posterior epidural space at a 30 angle, using "Loss-of-resistance Technique" with 3 ml of PF-NaCl (0.9% NSS). Correct needle placement was confirmed in the antero-posterior and lateral fluoroscopic views. The lead was gently introduced and manipulated under real-time fluoroscopy, constantly assessing for pain, discomfort, or paresthesias, until the tip rested at the desired level. Both sides were done in identical fashion. Electrode placement was tested until appropriate coverage was attained. Once the patient confirmed that the stimulation was over the desired area, the lead(s) was/were secured in place and the introducer needles removed. This was done under real-time fluoroscopy while observing the electrode tip to avoid unintended migration. The area was covered with a non-occlusive dressing and the patient transported to recovery for further programming.  Vitals:   05/03/24 1027 05/03/24 1034 05/03/24 1045 05/03/24 1055  BP: 113/89 106/70 118/72 110/67  Pulse:      Resp: 15 16 16 18   Temp:  (!) 97.2 F (36.2 C)    SpO2: 100% 100% 98% 97%  Weight:      Height:        Start Time: 1000 hrs. End Time: 1025 hrs.  Neurostimulator Details:  Lead(s):  Brand: Medtronic    Epidural Access Level:  T12-L1 T12-L1  Lead implant:  Bilateral   No. of Electrodes/Lead:  8 8  Laterality:  Left Right  Location (Top electrode):  T8 T8 (mid)  Model No.: [id number] Same  Length: 60cm Same  Lot No.: [id number]   Imaging Guidance (Spinal):         Type of Imaging Technique: Fluoroscopy Guidance (Spinal) Indication(s): Fluoroscopy guidance for needle placement to enhance accuracy in procedures requiring precise needle localization for targeted delivery of medication in or near specific anatomical locations not easily accessible without such real-time imaging assistance. Exposure Time: Please see nurses  notes. Contrast: None used. Fluoroscopic Guidance: I was personally present during the use of fluoroscopy. Tunnel Vision Technique used to obtain the best possible view of the target area. Parallax error corrected before commencing the procedure. Direction-depth-direction technique used to introduce the needle under continuous pulsed fluoroscopy.  Once target was reached, antero-posterior, oblique, and lateral fluoroscopic projection used confirm needle placement in all planes. Images permanently stored in EMR. Interpretation: No contrast injected. I personally interpreted the imaging intraoperatively. Adequate needle placement confirmed in multiple planes. Permanent images saved into the patient's record.  Antibiotic Prophylaxis:   Anti-infectives (From admission, onward)    Start     Dose/Rate Route Frequency Ordered Stop   05/03/24 0851  ceFAZolin  (ANCEF ) IVPB 2g/100 mL premix        2 g 200 mL/hr over 30 Minutes Intravenous 30 min pre-op 05/03/24 0851 05/03/24 0910   05/03/24 0000  cephALEXin (KEFLEX) 500 MG capsule        500 mg Oral 4 times daily 05/03/24 0825 05/10/24 2359      Indication(s): Implant Prophylaxis.  Post-operative Assessment:  Post-procedure Vital Signs:  Pulse/HCG Rate: 65(!) 59 Temp: (!) 97.2 F (36.2 C) Resp: 18 BP: 110/67 SpO2: 97 %  Complications: No immediate post-treatment complications observed by team, or reported by patient.  Note: The patient tolerated the entire procedure well. A repeat set of vitals were taken after the procedure and the patient was kept under observation following institutional policy, for this type of procedure. Post-procedural neurological assessment was performed, showing return to baseline, prior to discharge. The patient was provided with post-procedure discharge instructions, including a section on how to identify potential problems. Should any problems arise concerning this procedure, the patient was given instructions to  immediately contact us , at any time, without hesitation. In any case, we plan to contact the patient by telephone for a follow-up status report regarding this interventional procedure.  Comments:  No additional relevant information.  Plan of Care Orders:  Orders Placed This Encounter  Procedures   DG PAIN [facility] C-ARM 1-60 MIN NO REPORT    Intraoperative interpretation by procedural physician at [facility].    Standing Status:   Standing    Number of Occurrences:   1    Reason for exam::   Assistance in needle guidance and placement for procedures requiring needle placement in or near specific anatomical locations not easily accessible without such assistance.    Medications administered: We administered lidocaine , lactated ringers , midazolam , fentaNYL , ropivacaine (PF) 2 mg/mL (0.2%), and ceFAZolin .  See the medical record for exact dosing, route, and time of administration.   Follow-up plan:   Return in about 1 week (around 05/10/2024) for SCS lead pull.     Recent Visits Date Type Provider Dept  03/09/24 Office Visit Marcelino Nurse, MD [facility]  Showing recent visits within past 90 days and meeting all other requirements Today's Visits Date Type Provider Dept  05/03/24 Procedure visit Marcelino Nurse, MD [facility]  Showing today's visits and meeting all other requirements Future Appointments Date Type Provider Dept  05/10/24 Appointment Marcelino Nurse, MD [facility]  Showing future appointments within next 90 days and meeting all other requirements  Disposition: Discharge home  Discharge (Date  Time): 05/03/2024; 1058 hrs.   Primary Care Physician: [facility] Location: [facility] Note by: Nurse Marcelino, MD (TTS technology used. I apologize for any typographical errors that were not detected and corrected.) Date: 05/03/2024; Time: 11:45 AM

## 2024-05-03 NOTE — Patient Instructions (Addendum)
 Today we did the following -We have done a Spinal Cord Stimulator Trial with [employer]  -As long as the leads are in place, do not bathe or shower. You may sponge bathe.  -While the lead is in place, please limit the bending, lifting, or twisting because the lead can move.  -The things we want to see is if your pain improves (and by what percentage), if you can do more activity (don't overdo it), and if you can use less of your as needed medicine. Do not stop long acting medicines like methadone, oxycontin , MS Contin, etc without checking with us .  -It is VERY important that you pick up the antibiotics we prescribed, Keflex, on your way home from the trial and take them as prescribed(4 times a day), starting today, for as long as the lead is in place.  -The Spina Cord Stimulator Representative will be in contact with you while the lead is in place to make sure the trial goes as well as possible.  -Please contact us  with any questions or concerns at any time during the trial.   -If you start running a fever over 100 degrees, have severe back pain, or new pain running down the legs, or drainage coming from the lead site, contact us  immediately and/or go to the emergency room.  -Please do not restart any sort of medication that can thin your blood such as Aspirin, ibuprofen , motrin , aleve , plavix, coumadin, etc. If you aren't sure, call and ask.  -We will have you return on Wed to have the lead removed. If this is successful, at that point we can go over the details about the permanent implant.    Post-procedure Information What to expect: Most procedures involve the use of a local anesthetic (numbing medicine), and a steroid (anti-inflammatory medicine).  The local anesthetics may cause temporary numbness and weakness of the legs or arms, depending on the location of the block. This numbness/weakness may last 4-6 hours, depending on the local anesthetic used. In rare instances, it can last  up to 24 hours. While numb, you must be very careful not to injure the extremity.  After any procedure, you could expect the pain to get better within 15-20 minutes. This relief is temporary and may last 4-6 hours. Once the local anesthetics wears off, you could experience discomfort, possibly more than usual, for up to 10 (ten) days. In the case of radiofrequencies, it may last up to 6 weeks. Surgeries may take up to 8 weeks for the healing process. The discomfort is due to the irritation caused by needles going through skin and muscle. To minimize the discomfort, we recommend using ice the first day, and heat from then on. The ice should be applied for 15 minutes on, and 15 minutes off. Keep repeating this cycle until bedtime. Avoid applying the ice directly to the skin, to prevent frostbite. Heat should be used daily, until the pain improves (4-10 days). Be careful not to burn yourself.  Occasionally you may experience muscle spasms or cramps. These occur as a consequence of the irritation caused by the needle sticks to the muscle and the blood that will inevitably be lost into the surrounding muscle tissue. Blood tends to be very irritating to tissues, which tend to react by going into spasm. These spasms may start the same day of your procedure, but they may also take days to develop. This late onset type of spasm or cramp is usually caused by electrolyte imbalances triggered by the  steroids, at the level of the kidney. Cramps and spasms tend to respond well to muscle relaxants, multivitamins (some are triggered by the procedure, but may have their origins in vitamin deficiencies), and "Gatorade", or any sports drinks that can replenish any electrolyte imbalances. (If you are a diabetic, ask your pharmacist to get you a sugar-free brand.) Warm showers or baths may also be helpful. Stretching exercises are highly recommended. General Instructions:  Be alert for signs of possible infection: redness,  swelling, heat, red streaks, elevated temperature, and/or fever. These typically appear 4 to 6 days after the procedure. Immediately notify your doctor if you experience unusual bleeding, difficulty breathing, or loss of bowel or bladder control. If you experience increased pain, do not increase your pain medicine intake, unless instructed by your pain physician. Post-Procedure Care:  Be careful in moving about. Muscle spasms in the area of the injection may occur. Applying ice or heat to the area is often helpful. The incidence of spinal headaches after epidural injections ranges between 1.4% and 6%. If you develop a headache that does not seem to respond to conservative therapy, please let your physician know. This can be treated with an epidural blood patch.   Post-procedure numbness or redness is to be expected, however it should average 4 to 6 hours. If numbness and weakness of your extremities begins to develop 4 to 6 hours after your procedure, and is felt to be progressing and worsening, immediately contact your physician.   Diet:  If you experience nausea, do not eat until this sensation goes away. If you had a "Stellate Ganglion Block" for upper extremity "Reflex Sympathetic Dystrophy", do not eat or drink until your hoarseness goes away. In any case, always start with liquids first and if you tolerate them well, then slowly progress to more solid foods. Activity:  For the first 4 to 6 hours after the procedure, use caution in moving about as you may experience numbness and/or weakness. Use caution in cooking, using household electrical appliances, and climbing steps. If you need to reach your Doctor call our office: [phone] Monday-Thursday 8:00 am - 4:00 PM    Fridays: Closed     In case of an emergency: In case of emergency, call 911 or go to the nearest emergency room and have the physician there call us .  Interpretation of Procedure Every nerve block has two components: a  diagnostic component, and a treatment component. Unrealistic expectations are the most common causes of "perceived failure".  In a perfect world, a single nerve block should be able to completely and permanently eliminate the pain. Sadly, the world is not perfect.  Most pain management nerve blocks are performed using local anesthetics and steroids. Steroids are responsible for any long-term benefit that you may experience. Their purpose is to decrease any chronic swelling that may exist in the area. Steroids begin to work immediately after being injected. However, most patients will not experience any benefits until 5 to 10 days after the injection, when the swelling has come down to the point where they can tell a difference. Steroids will only help if there is swelling to be treated. As such, they can assist with the diagnosis. If effective, they suggest an inflammatory component to the pain, and if ineffective, they rule out inflammation as the main cause or component of the problem. If the problem is one of mechanical compression, you will get no benefit from those steroids.   In the case of local anesthetics, they  have a crucial role in the diagnosis of your condition. Most will begin to work within15 to 20 minutes after injection. The duration will depend on the type used (short- vs. Long-acting). It is of outmost importance that patients keep tract of their pain, after the procedure. To assist with this matter, a "Post-procedure Pain Diary" is provided. Make sure to complete it and to bring it back to your follow-up appointment.  As long as the patient keeps accurate, detailed records of their symptoms after every procedure, and returns to have those interpreted, every procedure will provide us  with invaluable information. Even a block that does not provide the patient with any relief, will always provide us  with information about the mechanism and the origin of the pain. The only time a nerve block  can be considered a waste of time is when patients do not keep track of the results, or do not keep their post-procedure appointment.  Reporting the results back to your physician The Pain Score  Pain is a subjective complaint. It cannot be seen, touched, or measured. We depend entirely on the patient's report of the pain in order to assess your condition and treatment. To evaluate the pain, we use a pain scale, where "0" means "No Pain", and a "10" is "the worst possible pain that you can even imagine" (i.e. something like been eaten alive by a shark or being torn apart by a lion).   You will frequently be asked to rate your pain. Please be as accurate, remember that medical decisions will be based on your responses. Please do not rate your pain above a 10. Doing so is actually interpreted as "symptom magnification" (exaggeration), as well as lack of understanding with regards to the scale. To put this into perspective, when you tell us  that your pain is at a 10 (ten), what you are saying is that there is nothing we can do to make this pain any worse. (Carefully think about that.) Today we did the following -We have done a Spinal Cord Stimulator Trial with ____________________  -As long as the leads are in place, do not bathe or shower. You may sponge bathe.  -While the lead is in place, please limit the bending, lifting, or twisting because the lead can move.  -The things we want to see is if your pain improves (and by what percentage), if you can do more activity (don't overdo it), and if you can use less of your as needed medicine. Do not stop long acting medicines like methadone, oxycontin , MS Contin, etc without checking with us .  -It is VERY important that you pick up the antibiotics we prescribed, Keflex, on your way home from the trial and take them as prescribed(4 times a day), starting today, for as long as the lead is in place.  -The Spina Cord Stimulator Representative will be in  contact with you while the lead is in place to make sure the trial goes as well as possible.  -Please contact us  with any questions or concerns at any time during the trial.   -If you start running a fever over 100 degrees, have severe back pain, or new pain running down the legs, or drainage coming from the lead site, contact us  immediately and/or go to the emergency room.  -Please do not restart any sort of medication that can thin your blood such as Aspirin, ibuprofen , motrin , aleve , plavix, coumadin, etc. If you aren't sure, call and ask.  -We will have you return  on 10/29Moderate Conscious Sedation, Adult, Care After After the procedure, it is common to have: Sleepiness for a few hours. Impaired judgment for a few hours. Trouble with balance. Nausea or vomiting if you eat too soon. Follow these instructions at home: For the time period you were told by your health care provider:  Rest. Do not participate in activities where you could fall or become injured. Do not drive or use machinery. Do not drink alcohol. Do not take sleeping pills or medicines that cause drowsiness. Do not make important decisions or sign legal documents. Do not take care of children on your own. Eating and drinking Follow instructions from your health care provider about what you may eat and drink. Drink enough fluid to keep your urine pale yellow. If you vomit: Drink clear fluids slowly and in small amounts as you are able. Clear fluids include water, ice chips, low-calorie sports drinks, and fruit juice that has water added to it (diluted fruit juice). Eat light and bland foods in small amounts as you are able. These foods include bananas, applesauce, rice, lean meats, toast, and crackers. General instructions Take over-the-counter and prescription medicines only as told by your health care provider. Have a responsible adult stay with you for the time you are told. Do not use any products that contain  nicotine  or tobacco. These products include cigarettes, chewing tobacco, and vaping devices, such as e-cigarettes. If you need help quitting, ask your health care provider. Return to your normal activities as told by your health care provider. Ask your health care provider what activities are safe for you. Your health care provider may give you more instructions. Make sure you know what you can and cannot do. Contact a health care provider if: You are still sleepy or having trouble with balance after 24 hours. You feel light-headed. You vomit every time you eat or drink. You get a rash. You have a fever. You have redness or swelling around the IV site. Get help right away if: You have trouble breathing. You start to feel confused at home. These symptoms may be an emergency. Get help right away. Call 911. Do not wait to see if the symptoms will go away. Do not drive yourself to the hospital. This information is not intended to replace advice given to you by your health care provider. Make sure you discuss any questions you have with your health care provider. Document Revised: 01/12/2022 Document Reviewed: 01/12/2022 [employer] Patient Education  2024 [employer] to have the lead removed. If this is successful, at that point we can go over the details about the permanent implant.  Moderate Conscious Sedation, Adult, Care After After the procedure, it is common to have: Sleepiness for a few hours. Impaired judgment for a few hours. Trouble with balance. Nausea or vomiting if you eat too soon. Follow these instructions at home: For the time period you were told by your health care provider:  Rest. Do not participate in activities where you could fall or become injured. Do not drive or use machinery. Do not drink alcohol. Do not take sleeping pills or medicines that cause drowsiness. Do not make important decisions or sign legal documents. Do not take care of children on your  own. Eating and drinking Follow instructions from your health care provider about what you may eat and drink. Drink enough fluid to keep your urine pale yellow. If you vomit: Drink clear fluids slowly and in small amounts as you are able. Clear fluids  include water, ice chips, low-calorie sports drinks, and fruit juice that has water added to it (diluted fruit juice). Eat light and bland foods in small amounts as you are able. These foods include bananas, applesauce, rice, lean meats, toast, and crackers. General instructions Take over-the-counter and prescription medicines only as told by your health care provider. Have a responsible adult stay with you for the time you are told. Do not use any products that contain nicotine  or tobacco. These products include cigarettes, chewing tobacco, and vaping devices, such as e-cigarettes. If you need help quitting, ask your health care provider. Return to your normal activities as told by your health care provider. Ask your health care provider what activities are safe for you. Your health care provider may give you more instructions. Make sure you know what you can and cannot do. Contact a health care provider if: You are still sleepy or having trouble with balance after 24 hours. You feel light-headed. You vomit every time you eat or drink. You get a rash. You have a fever. You have redness or swelling around the IV site. Get help right away if: You have trouble breathing. You start to feel confused at home. These symptoms may be an emergency. Get help right away. Call 911. Do not wait to see if the symptoms will go away. Do not drive yourself to the hospital. This information is not intended to replace advice given to you by your health care provider. Make sure you discuss any questions you have with your health care provider. Document Revised: 01/12/2022 Document Reviewed: 01/12/2022 Elsevier Patient Education  2024 [employer].

## 2024-05-04 ENCOUNTER — Telehealth: Payer: Self-pay

## 2024-05-04 NOTE — Telephone Encounter (Signed)
 Post procedure follow up.  LM

## 2024-05-10 ENCOUNTER — Ambulatory Visit (HOSPITAL_BASED_OUTPATIENT_CLINIC_OR_DEPARTMENT_OTHER): Admitting: Student in an Organized Health Care Education/Training Program

## 2024-05-10 ENCOUNTER — Other Ambulatory Visit: Payer: Self-pay | Admitting: Student in an Organized Health Care Education/Training Program

## 2024-05-10 ENCOUNTER — Ambulatory Visit
Admission: RE | Admit: 2024-05-10 | Discharge: 2024-05-10 | Disposition: A | Discharge status: Home / Self Care | Source: Ambulatory Visit | Attending: Student in an Organized Health Care Education/Training Program | Admitting: Student in an Organized Health Care Education/Training Program

## 2024-05-10 ENCOUNTER — Encounter: Payer: Self-pay | Admitting: Student in an Organized Health Care Education/Training Program

## 2024-05-10 VITALS — BP 104/58 | HR 62 | Temp 94.1°F | Resp 16 | Ht 68.0 in | Wt 180.0 lb

## 2024-05-10 DIAGNOSIS — G894 Chronic pain syndrome: Secondary | ICD-10-CM

## 2024-05-10 DIAGNOSIS — G8929 Other chronic pain: Secondary | ICD-10-CM

## 2024-05-10 DIAGNOSIS — M961 Postlaminectomy syndrome, not elsewhere classified: Secondary | ICD-10-CM

## 2024-05-10 NOTE — Progress Notes (Signed)
 PROVIDER NOTE: Interpretation of information contained herein should be left to medically-trained personnel. Specific patient instructions are provided elsewhere under Patient Instructions section of medical record. This document was created in part using AI and STT-dictation technology, any transcriptional errors that may result from this process are unintentional.  Patient: Lisa [name]  Service: E/M Post-op Encounter  [facility]P: [facility]  DOB: 06-23-1971  DOS: 05/10/2024  Provider: Wallie Sherry, MD  MRN: [id number]  Delivery: Face-to-face  Specialty: Interventional Pain Management  Type: Established Patient  Setting: Ambulatory outpatient facility  Specialty designation: 09  Referring Prov.: [place]  Location: Outpatient office facility  SCS TRIAL POST-OP EVALUATION     Primary Reason(s) for Visit: Encounter for removal of temporary spinal cord stimulator lead(s) and evaluation of trial implant. CC: Back Pain  HPI  Ms. [name] is a 53 y.o. year old, female patient, who comes today for a post-procedure evaluation. She has Abdominal pain, generalized; Esophageal reflux; Schizoaffective disorder (HCC); PTSD (post-traumatic stress disorder); Bipolar I disorder, most recent episode depressed (HCC); High BMI; Dysmenorrhea; Abdominal bloating; Endometrial polyp; DVT (deep venous thrombosis) (HCC); Pulmonary embolism (HCC); Nonspecific interstitial pneumonia (HCC); Cough, persistent; Obstructive apnea; Asthma without status asthmaticus; Depression; Essential hypertension; Obesity, Class III, BMI 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); Symptomatic anemia; Acute deep vein thrombosis (DVT) of popliteal vein of right lower extremity (HCC); Iron  deficiency anemia due to chronic blood loss; Chronic constipation; Menorrhagia; Migraine; Prediabetes; Vocal cord polyps; S/P hysterectomy; S/P laparoscopic hysterectomy; Chronic radicular lumbar pain; Cauda equina compression (HCC);  S/P gastric bypass; Bipolar 1 disorder, depressed, moderate (HCC); GAD (generalized anxiety disorder); 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 Her primarily concern today is the Back Pain  Pain Assessment: Location: Lower Back Radiating: right hip down to bottom of right foot; intermittent pain to left knee; radiates up spine to bilat shoulders Onset: More than a month ago Duration: Chronic pain Quality: Aching, Constant, Throbbing Severity: 3 /10 (subjective, self-reported pain score)  Effect on ADL: limits adls; pt currently in voice therapy  stating she has been told by voice therapist that intermittent episodes of losing voice is related to degenerative disc disease Timing: Constant Modifying factors: SCS trial BP: (!) 104/58  HR: 62  Ms. [name] comes in today, after a SCS (Spinal Cord Stimulator) Trial Implant on 05/04/2024, to have her percutaneous, temporary neurostimulator lead(s) removed and to evaluate the trial experience to determine if a permanent implant may be effective in controlling some or all of her chronic pain symptoms.  Further details on both, my assessment(s), as well as the proposed treatment plan, please see below.  Patient endorses at least 50% analgesic benefit during her SCS trial, improvement in her ability to stand and walk.  She is interested in moving forward with permanent implant.  I will place a referral to Dr. Katrina.  Post-operative Assessment Intra-procedural problems/complications: None observed.         Reported side-effects: None.        Post-surgical adverse reactions or complications: None reported         Laboratory Chemistry Profile   Renal Lab Results  Component Value Date   BUN 9 04/20/2023   CREATININE 0.66 04/20/2023   GFRAA >60 08/16/2019   GFRNONAA >60 04/20/2023   PROTEINUR NEGATIVE  02/03/2022     Electrolytes Lab Results  Component Value Date   NA 137 04/20/2023   K  3.6 04/20/2023   CL 105 04/20/2023   CALCIUM 8.9 04/20/2023   MG 2.2 05/31/2019     Hepatic Lab Results  Component Value Date   AST 21 04/20/2023   ALT 23 04/20/2023   ALBUMIN 4.0 04/20/2023   ALKPHOS 50 04/20/2023   AMYLASE 97 04/24/2011   LIPASE 47 02/27/2017     ID Lab Results  Component Value Date   HIV NON REACTIVE 08/25/2013   SARSCOV2NAA POSITIVE (A) 05/31/2019   STAPHAUREUS NEGATIVE 01/07/2022   MRSAPCR NEGATIVE 01/07/2022   PREGTESTUR NEGATIVE 08/15/2019     Bone Lab Results  Component Value Date   TESTOFREE 1.1 03/03/2019   TESTOSTERONE  13 03/03/2019     Endocrine Lab Results  Component Value Date   GLUCOSE 79 04/20/2023   GLUCOSEU 150 (A) 02/03/2022   HGBA1C 5.6 03/18/2014   TSH 1.888 03/02/2019   TESTOFREE 1.1 03/03/2019   TESTOSTERONE  13 03/03/2019     Neuropathy Lab Results  Component Value Date   VITAMINB12 258 04/20/2023   FOLATE 16.7 03/03/2019   HGBA1C 5.6 03/18/2014   HIV NON REACTIVE 08/25/2013     CNS No results found for: COLORCSF, APPEARCSF, RBCCOUNTCSF, WBCCSF, POLYSCSF, LYMPHSCSF, EOSCSF, PROTEINCSF, GLUCCSF, JCVIRUS, CSFOLI, IGGCSF, LABACHR, ACETBL   Inflammation (CRP: Acute  ESR: Chronic) Lab Results  Component Value Date   ESRSEDRATE 14 04/10/2014     Rheumatology No results found for: RF, ANA, LABURIC, URICUR, LYMEIGGIGMAB, LYMEABIGMQN, HLAB27   Coagulation Lab Results  Component Value Date   INR 1.0 04/20/2023   LABPROT 13.5 04/20/2023   APTT 24 04/20/2023   PLT 274 04/20/2023   AT3 120 03/15/2019     Cardiovascular Lab Results  Component Value Date   CKTOTAL 87 08/17/2014   CKMB < 0.5 (L) 08/17/2014   TROPONINI <0.03 02/28/2017   HGB 13.5 04/20/2023   HCT 41.2 04/20/2023     Screening Lab Results  Component Value Date   SARSCOV2NAA POSITIVE (A) 05/31/2019   COVIDSOURCE  NASOPHARYNGEAL 03/03/2019   STAPHAUREUS NEGATIVE 01/07/2022   MRSAPCR NEGATIVE 01/07/2022   HIV NON REACTIVE 08/25/2013   PREGTESTUR NEGATIVE 08/15/2019     Cancer Lab Results  Component Value Date   CEA 2.1 08/18/2014   CA125 40.7 (H) 02/15/2015     Allergens No results found for: ALMOND, APPLE, ASPARAGUS, AVOCADO, BANANA, BARLEY, BASIL, BAYLEAF, GREENBEAN, LIMABEAN, WHITEBEAN, BEEFIGE, REDBEET, BLUEBERRY, BROCCOLI, CABBAGE, MELON, CARROT, CASEIN, CASHEWNUT, CAULIFLOWER, CELERY     Note: Lab results reviewed.  Recent Imaging Results  Results for orders placed during the hospital encounter of 01/19/22  DG C-Arm 1-60 Min-No Report  Narrative Fluoroscopy was utilized by the requesting physician.  No radiographic interpretation.  Interpretation Report: Fluoroscopy was used during the procedure to assist with needle guidance. The images were interpreted intraoperatively by the requesting physician.        Meds   Current Outpatient Medications:    AIMOVIG 140 MG/ML SOAJ, Inject 140 mg into the skin every 30 (thirty) days., Disp: , Rfl:    albuterol  (PROVENTIL  HFA;VENTOLIN  HFA) 108 (90 Base) MCG/ACT inhaler, Inhale 2 puffs into the lungs every 6 (six) hours as needed for wheezing or shortness of breath., Disp: 1 Inhaler, Rfl: 2   apixaban  (ELIQUIS ) 5 MG TABS tablet, Take 1 tablet (5 mg total) by mouth 2 (two) times daily., Disp: 60 tablet, Rfl: 2   cephALEXin (KEFLEX) 500 MG capsule, Take 1 capsule (500 mg total) by mouth 4 (four) times daily for 7 days., Disp: 28  capsule, Rfl: 0   Multiple Vitamin (MULTIVITAMIN) tablet, Take 1 tablet by mouth daily., Disp: , Rfl:    pantoprazole  (PROTONIX ) 40 MG tablet, Take 1 tablet (40 mg total) by mouth every morning., Disp: 30 tablet, Rfl: 11   phentermine 15 MG capsule, Take 37.5 mg by mouth every morning., Disp: , Rfl:    Rimegepant Sulfate 75 MG TBDP, Take by mouth., Disp: , Rfl:    rizatriptan  (MAXALT) 10 MG tablet, Take by mouth., Disp: , Rfl:    SUMAtriptan  (IMITREX ) 100 MG tablet, Take 100 mg by mouth every 2 (two) hours as needed for migraine. , Disp: , Rfl:    traZODone  (DESYREL ) 100 MG tablet, Take 1 tablet (100 mg total) by mouth at bedtime., Disp: 90 tablet, Rfl: 0   valACYclovir  (VALTREX ) 1000 MG tablet, Take 1,000 mg by mouth 2 (two) times daily., Disp: , Rfl:   ROS  Constitutional: Denies any fever or chills Gastrointestinal: No reported hemesis, hematochezia, vomiting, or acute GI distress Musculoskeletal: Denies any acute onset joint swelling, redness, loss of ROM, or weakness Neurological: No reported episodes of acute onset apraxia, aphasia, dysarthria, agnosia, amnesia, paralysis, loss of coordination, or loss of consciousness  Allergies  Ms. [name] is allergic to lisinopril and morphine.  PFSH  Drug: Ms. Rivenbark  reports no history of drug use. Alcohol:  reports that she does not currently use alcohol. Tobacco:  reports that she has never smoked. She has never used smokeless tobacco. Medical:  has a past medical history of Abdominal pain, Anemia, Arthritis, Asthma, Bipolar 1 disorder (HCC), Blood in stool, Change in voice, Chest pain, Constipation, Depression, Diverticulitis, DVT (deep venous thrombosis) (HCC) (12/13/2014), Dysrhythmia, Fatigue, Gallstones (04/10/2011), GERD (gastroesophageal reflux disease), History of blood transfusion (02/2019), History of hiatal hernia, History of kidney stones, History of methicillin resistant staphylococcus aureus (MRSA), Hypertension, Idiopathic non-specific interstitial pneumonitis (HCC), Migraines, MRSA carrier, Nausea, Pancreatitis, Pre-diabetes, PTSD (post-traumatic stress disorder), Pulmonary embolism (HCC) (12/13/2014), Sleep apnea, Takes iron  supplements, Trouble swallowing, and Vocal cord polyps. Surgical: Ms. Sazama  has a past surgical history that includes Nasal polyp surgery; Wisdom tooth  extraction; Laparoscopic cholecystectomy w/ cholangiography (04/25/2011); Dilation and curettage of uterus; Esophagogastroduodenoscopy (06/02/2011); BRAVO ph study (06/02/2011); Tubal ligation (1996); Endometrial ablation (N/A, 03/30/2019); Hysteroscopy (03/30/2019); Total laparoscopic hysterectomy with salpingectomy (Bilateral, 08/15/2019); Cystoscopy (08/15/2019); Colonoscopy; Gastric bypass (2021); Lumbar laminectomy/decompression microdiscectomy (N/A, 01/19/2022); Lumbar laminectomy for epidural abscess (N/A, 01/30/2022); Back surgery; and Colon surgery. Family: family history includes Alcohol abuse in her father, paternal aunt, paternal grandfather, and paternal uncle; Bipolar disorder in her mother, niece, and sister; Cancer (age of onset: 0) in her paternal aunt; Diabetes in her mother; Lung cancer in her father; Pancreatic cancer in her mother.  Postop Exam  General appearance: Afebrile. Well nourished, well developed, and well hydrated. In no apparent acute distress. Vitals:   05/10/24 0906  BP: (!) 104/58  Pulse: 62  Resp: 16  Temp: (!) 94.1 F (34.5 C)  SpO2: 100%  Weight: 180 lb (81.6 kg)  Height: 5' 8 (1.727 m)   BMI Assessment: Estimated body mass index is 27.37 kg/m as calculated from the following:   Height as of this encounter: 5' 8 (1.727 m).   Weight as of this encounter: 180 lb (81.6 kg).  Surgical site: Wound is healing well. No redness, tenderness, discharge, abnormal odors, or any other evidence of infection or complications.  Assessment  Primary Diagnosis & Pertinent Problem List: The primary encounter diagnosis was Failed back surgical syndrome.  Diagnoses of Lumbar post-laminectomy syndrome and Chronic radicular lumbar pain were also pertinent to this visit.  Diagnosis Status  1. Failed back surgical syndrome   2. Lumbar post-laminectomy syndrome   3. Chronic radicular lumbar pain    Controlled Controlled Controlled   Plan of Care Orders:   Referral to  Dr. Katrina to review/discuss permanent implant Orders Placed This Encounter  Procedures   Ambulatory referral to Neurosurgery    Referral Priority:   Routine    Referral Type:   Surgical    Referral Reason:   Specialty Services Required    Referred to Provider:   Clois Fret, MD    Requested Specialty:   Neurosurgery    Number of Visits Requested:   1   Medications administered: Nandi L. [name] had no medications administered during this visit.  See the medical record for exact dosing, route, and time of administration.   Follow-up plan:   Return for PRN.     Recent Visits Date Type Provider Dept  05/03/24 Procedure visit Marcelino Nurse, MD [facility]  03/09/24 Office Visit Marcelino Nurse, MD [facility]  Showing recent visits within past 90 days and meeting all other requirements Today's Visits Date Type Provider Dept  05/10/24 Procedure visit Marcelino Nurse, MD [facility]  Showing today's visits and meeting all other requirements Future Appointments No visits were found meeting these conditions. Showing future appointments within next 90 days and meeting all other requirements  Disposition: Discharge home  Discharge (Date  Time): 05/10/2024; 0945 hrs.   Primary Care Physician: [facility] Location: [facility] Note by: Nurse Marcelino, MD (TTS technology used. I apologize for any typographical errors that were not detected and corrected.) Date: 05/10/2024; Time: 9:49 AM

## 2024-05-10 NOTE — Patient Instructions (Signed)
 Dr. Marcelino has entered a referral to Dr. Katrina, but you may call their office to schedule appt to discuss SCS implant.

## 2024-05-10 NOTE — Progress Notes (Signed)
 Safety precautions to be maintained throughout the outpatient stay will include: orient to surroundings, keep bed in low position, maintain call bell within reach at all times, provide assistance with transfer out of bed and ambulation.

## 2024-05-22 NOTE — Progress Notes (Signed)
 Neurosurgery Telephone (Audio-Only) Note  Requesting Provider     [place] [address] [place],  [place] [address] T: [phone] F: [phone]  Primary Care Provider [facility] [address] [place] [address] T: [phone] F: [phone]   Discussed the use of AI scribe software for clinical note transcription with the patient, who gave verbal consent to proceed.  History of Present Illness Emalea Mix is a 53 year old female with degenerative disc disorder who presents for consideration of permanent spinal cord stimulator placement.  She experiences significant pain in the back of her right leg, which was partially alleviated by a successful trial of a spinal cord stimulator, improving her pain by far more than fifty percent. She also experienced a functional improvement with stimulation. She has completed physical therapy and a psychological evaluation prior to the trial.  She has undergone previous spine surgeries, including a decompression surgery that required revision due to durotomy. She expresses concern about undergoing another back surgery due to these past experiences.  She is currently participating in water aerobics to manage her condition and inquires about the impact of the stimulator on her physical activities and daily life. She is also concerned about potential complications of the surgery, such as infection and lead migration, and the impact on her ability to travel and pass through metal detectors.    03/04/2023 Note from Edsel Goods, PA-C Telehealth visit was conducted with Manuelita Jama Rinks, a 53 y.o. female via telephone.  History of Present Illness: Ms. Passarella a 53 year old woman to our office for chronic lumbosacral complaints and ongoing right leg pain.  Despite medication management, ESI's, and physical therapy she continues to have the symptoms.  We updated MRI which does not show  any convincing compressive pathology.  I reviewed her symptoms with Dr. Katrina who recommended consideration of a spinal cord stimulator and I contacted the patient today to discuss these options.  Her symptoms are largely unchanged.  General Review of Systems:  A ROS was performed including pertinent positive and negatives as documented.  All other systems are negative.    Prior to Admission medications   Medication Sig Start Date End Date Taking? Authorizing Provider  AIMOVIG 140 MG/ML SOAJ Inject 140 mg into the skin every 30 (thirty) days. 02/19/19   [provider]  albuterol  (PROVENTIL  HFA;VENTOLIN  HFA) 108 (90 Base) MCG/ACT inhaler Inhale 2 puffs into the lungs every 6 (six) hours as needed for wheezing or shortness of breath. 02/28/17   Edelmiro Leash, MD  azaTHIOprine  (IMURAN ) 50 MG tablet Take 50 mg by mouth every morning. 02/02/19   [provider]  gabapentin  (NEURONTIN ) 300 MG capsule TAKE 1 CAPSULE BY MOUTH IN THE MORNING AND AFTERNOON, AND TAKE 2 CAPSULES AT NIGHT 02/09/23   Hilma Hastings, PA-C  hydrOXYzine  (ATARAX ) 25 MG tablet Take 1 tablet (25 mg total) by mouth 2 (two) times daily as needed for anxiety. 12/03/22   Eappen, Saramma, MD  Ipratropium-Albuterol  (COMBIVENT ) 20-100 MCG/ACT AERS respimat Inhale 1 puff into the lungs every morning.    [provider]  Multiple Vitamin (MULTIVITAMIN) tablet Take 1 tablet by mouth daily.    [provider]  pantoprazole  (PROTONIX ) 40 MG tablet Take 1 tablet (40 mg total) by mouth every morning. 01/24/22 01/24/23  Abd-El-Barr, Deatrice, MD  phentermine 15 MG capsule Take 37.5 mg by mouth every morning.    [provider]  Rimegepant Sulfate 75 MG TBDP Take by mouth.  03/31/22   [provider]  rizatriptan (MAXALT) 10 MG tablet Take by mouth. 03/31/22 03/31/23  [provider]  SUMAtriptan  (IMITREX ) 100 MG tablet Take 100 mg by mouth every 2 (two) hours as needed for migraine.   02/10/19   [provider]  topiramate  (TOPAMAX ) 25 MG tablet Take 2 tablets (50 mg total) by mouth daily. 12/03/22   Eappen, Saramma, MD  traZODone  (DESYREL ) 100 MG tablet Take 1 tablet (100 mg total) by mouth at bedtime. 12/03/22   Eappen, Saramma, MD  valACYclovir  (VALTREX ) 1000 MG tablet Take 1,000 mg by mouth 2 (two) times daily.    [provider]  warfarin (COUMADIN) 1 MG tablet TAKE 1 TABLET BY MOUTH NIGHTLY ALONG WITH 5MG  TABLET FOR A TOTAL DAILY DOSE OF 6MG  08/17/22   [provider]  warfarin (COUMADIN) 5 MG tablet TAKE 1 TABLET BY MOUTH NIGHTLY ALONG WITH 1MG  TABLET FOR A TOTAL OF 6MG  DAILY 08/17/22   [provider]    DATA REVIEWED    Imaging Studies  EMG 02/16/23 Impression: This is an abnormal study. 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 The findings are mild to moderate electrically at bilateral S1 roots and mild in degree electrically at the right L5 root. No electrodiagnostic evidence of a large fiber sensorimotor neuropathy.    Assessment and Plan Assessment & Plan Planned spinal cord stimulator implantation for chronic right leg pain due to degenerative disc disease Chronic right leg pain due to degenerative disc disease. Previous trial of spinal cord stimulator provided significant pain relief, >50% with improvement in her ability to perform ADLs. Permanent implantation planned for long-term relief. Procedure involves thoracic laminectomy for paddle lead and internal rechargeable battery placement. Risks include infection, bleeding, lead migration. Procedure supported by nerve monitoring. Post-operative recovery includes limited stimulation for two weeks, full stimulation to start at her 2 week appointment with adjustments and titrations to follow.  - Scheduled spinal cord stimulator implantation surgery. -  Coordinated with [employer] team for device placement. - Arranged pre-operative appointments and paperwork. - Plan for six-week recovery period post-surgery.  Penne LELON Sharps, MD [facility] Neurosurgery [place]

## 2024-05-22 NOTE — H&P (View-Only) (Signed)
 Neurosurgery Telephone (Audio-Only) Note  Requesting Provider     [place], [facility] [address] [place],  [place] [address] T: [phone] F: [phone]  Primary Care Provider [facility] [address] [place] [address] T: [phone] F: [phone]   Discussed the use of AI scribe software for clinical note transcription with the patient, who gave verbal consent to proceed.  History of Present Illness Lisa [name] is a 53 year old female with degenerative disc disorder who presents for consideration of permanent spinal cord stimulator placement.  She experiences significant pain in the back of her right leg, which was partially alleviated by a successful trial of a spinal cord stimulator, improving her pain by far more than fifty percent. She also experienced a functional improvement with stimulation. She has completed physical therapy and a psychological evaluation prior to the trial.  She has undergone previous spine surgeries, including a decompression surgery that required revision due to durotomy. She expresses concern about undergoing another back surgery due to these past experiences.  She is currently participating in water aerobics to manage her condition and inquires about the impact of the stimulator on her physical activities and daily life. She is also concerned about potential complications of the surgery, such as infection and lead migration, and the impact on her ability to travel and pass through metal detectors.    03/04/2023 Note from Edsel Goods, PA-C Telehealth visit was conducted with Lisa [name], a 53 y.o. female via telephone.  History of Present Illness: Lisa [name] a 53 year old woman to our office for chronic lumbosacral complaints and ongoing right leg pain.  Despite medication management, ESI's, and physical therapy she continues to have the symptoms.  We updated MRI which does not show  any convincing compressive pathology.  I reviewed her symptoms with Dr. Katrina who recommended consideration of a spinal cord stimulator and I contacted the patient today to discuss these options.  Her symptoms are largely unchanged.  General Review of Systems:  A ROS was performed including pertinent positive and negatives as documented.  All other systems are negative.    Prior to Admission medications   Medication Sig Start Date End Date Taking? Authorizing Provider  AIMOVIG 140 MG/ML SOAJ Inject 140 mg into the skin every 30 (thirty) days. 02/19/19   [provider]  albuterol  (PROVENTIL  HFA;VENTOLIN  HFA) 108 (90 Base) MCG/ACT inhaler Inhale 2 puffs into the lungs every 6 (six) hours as needed for wheezing or shortness of breath. 02/28/17   Edelmiro Leash, MD  azaTHIOprine  (IMURAN ) 50 MG tablet Take 50 mg by mouth every morning. 02/02/19   [provider]  gabapentin  (NEURONTIN ) 300 MG capsule TAKE 1 CAPSULE BY MOUTH IN THE MORNING AND AFTERNOON, AND TAKE 2 CAPSULES AT NIGHT 02/09/23   Hilma Hastings, PA-C  hydrOXYzine  (ATARAX ) 25 MG tablet Take 1 tablet (25 mg total) by mouth 2 (two) times daily as needed for anxiety. 12/03/22   Eappen, Saramma, MD  Ipratropium-Albuterol  (COMBIVENT ) 20-100 MCG/ACT AERS respimat Inhale 1 puff into the lungs every morning.    [provider]  Multiple Vitamin (MULTIVITAMIN) tablet Take 1 tablet by mouth daily.    [provider]  pantoprazole  (PROTONIX ) 40 MG tablet Take 1 tablet (40 mg total) by mouth every morning. 01/24/22 01/24/23  Abd-El-Barr, Deatrice, MD  phentermine 15 MG capsule Take 37.5 mg by mouth every morning.    [provider]  Rimegepant Sulfate 75 MG TBDP Take by mouth.  03/31/22   [provider]  rizatriptan (MAXALT) 10 MG tablet Take by mouth. 03/31/22 03/31/23  [provider]  SUMAtriptan  (IMITREX ) 100 MG tablet Take 100 mg by mouth every 2 (two) hours as needed for migraine.   02/10/19   [provider]  topiramate  (TOPAMAX ) 25 MG tablet Take 2 tablets (50 mg total) by mouth daily. 12/03/22   Eappen, Saramma, MD  traZODone  (DESYREL ) 100 MG tablet Take 1 tablet (100 mg total) by mouth at bedtime. 12/03/22   Eappen, Saramma, MD  valACYclovir  (VALTREX ) 1000 MG tablet Take 1,000 mg by mouth 2 (two) times daily.    [provider]  warfarin (COUMADIN) 1 MG tablet TAKE 1 TABLET BY MOUTH NIGHTLY ALONG WITH 5MG  TABLET FOR A TOTAL DAILY DOSE OF 6MG  08/17/22   [provider]  warfarin (COUMADIN) 5 MG tablet TAKE 1 TABLET BY MOUTH NIGHTLY ALONG WITH 1MG  TABLET FOR A TOTAL OF 6MG  DAILY 08/17/22   [provider]    DATA REVIEWED    Imaging Studies  EMG 02/16/23 Impression: This is an abnormal study. 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 The findings are mild to moderate electrically at bilateral S1 roots and mild in degree electrically at the right L5 root. No electrodiagnostic evidence of a large fiber sensorimotor neuropathy.    Assessment and Plan Assessment & Plan Planned spinal cord stimulator implantation for chronic right leg pain due to degenerative disc disease Chronic right leg pain due to degenerative disc disease. Previous trial of spinal cord stimulator provided significant pain relief, >50% with improvement in her ability to perform ADLs. Permanent implantation planned for long-term relief. Procedure involves thoracic laminectomy for paddle lead and internal rechargeable battery placement. Risks include infection, bleeding, lead migration. Procedure supported by nerve monitoring. Post-operative recovery includes limited stimulation for two weeks, full stimulation to start at her 2 week appointment with adjustments and titrations to follow.  - Scheduled spinal cord stimulator implantation surgery. -  Coordinated with [employer] team for device placement. - Arranged pre-operative appointments and paperwork. - Plan for six-week recovery period post-surgery.  Penne LELON Sharps, MD [facility] Neurosurgery [place]

## 2024-05-29 ENCOUNTER — Encounter: Payer: Self-pay | Admitting: Neurosurgery

## 2024-05-29 ENCOUNTER — Ambulatory Visit: Payer: Self-pay | Admitting: Neurosurgery

## 2024-05-29 ENCOUNTER — Other Ambulatory Visit: Payer: Self-pay

## 2024-05-29 ENCOUNTER — Ambulatory Visit (INDEPENDENT_AMBULATORY_CARE_PROVIDER_SITE_OTHER): Admitting: Neurosurgery

## 2024-05-29 VITALS — BP 110/78 | Wt 185.6 lb

## 2024-05-29 DIAGNOSIS — M5416 Radiculopathy, lumbar region: Secondary | ICD-10-CM | POA: Diagnosis not present

## 2024-05-29 DIAGNOSIS — G894 Chronic pain syndrome: Secondary | ICD-10-CM | POA: Insufficient documentation

## 2024-05-29 DIAGNOSIS — G8929 Other chronic pain: Secondary | ICD-10-CM

## 2024-05-29 DIAGNOSIS — Z01818 Encounter for other preprocedural examination: Secondary | ICD-10-CM

## 2024-05-29 DIAGNOSIS — M961 Postlaminectomy syndrome, not elsewhere classified: Secondary | ICD-10-CM

## 2024-05-29 DIAGNOSIS — Z9889 Other specified postprocedural states: Secondary | ICD-10-CM

## 2024-05-29 DIAGNOSIS — M51362 Other intervertebral disc degeneration, lumbar region with discogenic back pain and lower extremity pain: Secondary | ICD-10-CM | POA: Diagnosis not present

## 2024-05-29 NOTE — Patient Instructions (Signed)
 Please see below for information in regards to your upcoming surgery:   Planned surgery: THORACIC LAMINECTOMY FOR SPINAL CORD STIMULATOR WITH IPG PLACEMENT    Surgery date: 06/27/24 at [facility] (Medical Mall: [address] [place], [place] [address]) - you will find out your arrival time the business day before your surgery.   Pre-op appointment at [facility] Pre-admit Testing: you will receive a call with a date/time for this appointment. If you are scheduled for an in person appointment, Pre-admit Testing is located on the first floor of the [facility] building, [address] [facility] [address], [address]. During this appointment, they will advise you which medications you can take the morning of surgery, and which medications you will need to hold for surgery. Labs (such as blood work, EKG) may be done at your pre-op appointment. You are not required to fast for these labs. Should you need to change your pre-op appointment, please call Pre-admit testing at [phone].     Blood thinners:   Eliquis  (apixaban ):   stop Eliquis  3 days prior, resume Eliquis  14 days after    Surgical clearance: we will send a clearance form to Dr. Theotis. They may wish to see you in their office prior to signing the clearance form. If so, they may call you to schedule an appointment.     Common restrictions after spine surgery: No bending, lifting, or twisting ("BLT"). Avoid lifting objects heavier than 10 pounds for the first 6 weeks after surgery. Where possible, avoid household activities that involve lifting, bending, reaching, pushing, or pulling such as laundry, vacuuming, grocery shopping, and childcare. Try to arrange for help from friends and family for these activities while you heal. Do not drive while taking prescription pain medication. Weeks 6 through 12 after surgery: avoid lifting more than 25 pounds.     How to contact us :  If you have any questions/concerns  before or after surgery, you can reach us  at [phone], or you can send a mychart message. We can be reached by phone or mychart 8am-4pm, Monday-Friday.  *Please note: Calls after 4pm are forwarded to a third party answering service. Mychart messages are not routinely monitored during evenings, weekends, and holidays. Please call our office to contact the answering service for urgent concerns during non-business hours.    If you have FMLA/disability paperwork, please drop it off or fax it to [phone]   Appointments/FMLA & disability paperwork: Reche Hait, & Nichole Registered Nurse/Surgery scheduler: Kendelyn, RN & Katie, RN Certified Medical Assistants: Don, CMA, Elenor, CMA, Damien, CMA, & Auston, [place] Physician Assistants: Lyle Decamp, PA-C, Edsel Goods, PA-C & Glade Boys, PA-C Surgeons: Penne Sharps, MD & Reeves Daisy, MD    [facility] PREADMIT TESTING VISIT and SURGERY INFORMATION SHEET   Now that surgery has been scheduled you can anticipate several phone calls from [facility] services. A pharmacy technician will call you to verify your current list of medications taken at home.               The Pre-Service Center will call to verify your insurance information and to give you billing estimates and information.             The Preadmit Testing Office will be calling to schedule a visit to obtain information for the anesthesia team and provide instructions on preparation for surgery.  What can you expect for the Preadmit Testing Visit: Appointments may be scheduled in-person or by telephone.  If  a telephone visit is scheduled, you may be asked to come into the office to have lab tests or other studies performed.   This visit will not be completed any greater than 14 days prior to your surgery.  If your surgery has been scheduled for a future date, please do not be alarmed if we have not contacted you to schedule an appointment more than  a month prior to the surgery date.    Please be prepared to provide the following information during this appointment:            -Personal medical history                                               -Medication and allergy list            -Any history of problems with anesthesia              -Recent lab work or diagnostic studies            -Please notify us  of any needs we should be aware of to provide the best care possible           -You will be provided with instructions on how to prepare for your surgery.    On The Day of Surgery:  You must have a driver to take you home after surgery, you will be asked not to drive for 24 hours following surgery.  Taxi, Gisele and non-medical transport will not be acceptable means of transportation unless you have a responsible individual who will be traveling with you.  Visitors in the surgical area:   2 people will be able to visit you in your room once your preparation for surgery has been completed. During surgery, your visitors will be asked to wait in the Surgery Waiting Area.  It is not a requirement for them to stay, if they prefer to leave and come back.  Your visitor(s) will be given an update once the surgery has been completed.  No visitors are allowed in the initial recovery room to respect patient privacy and safety.  Once you are more awake and transfer to the secondary recovery area, or are transferred to an inpatient room, visitors will again be able to see you.  To respect and protect your privacy: We will ask on the day of surgery who your driver will be and what the contact number for that individual will be. We will ask if it is okay to share information with this individual, or if there is an alternative individual that we, or the surgeon, should contact to provide updates and information. If family or friends come to the surgical information desk requesting information about you, who you have not listed with us , no information  will be given.   It may be helpful to designate someone as the main contact who will be responsible for updating your other friends and family.    PREADMIT TESTING OFFICE: [phone] SAME DAY SURGERY: [phone] We look forward to caring for you before and throughout the process of your surgery.

## 2024-06-20 ENCOUNTER — Inpatient Hospital Stay: Admission: RE | Admit: 2024-06-20 | Source: Ambulatory Visit

## 2024-06-21 ENCOUNTER — Inpatient Hospital Stay: Admission: RE | Admit: 2024-06-21 | Discharge: 2024-06-21 | Attending: Neurosurgery | Admitting: Neurosurgery

## 2024-06-21 ENCOUNTER — Other Ambulatory Visit: Payer: Self-pay

## 2024-06-21 VITALS — BP 108/73 | HR 65 | Resp 16 | Ht 68.0 in | Wt 173.7 lb

## 2024-06-21 DIAGNOSIS — M961 Postlaminectomy syndrome, not elsewhere classified: Secondary | ICD-10-CM | POA: Insufficient documentation

## 2024-06-21 DIAGNOSIS — Z01812 Encounter for preprocedural laboratory examination: Secondary | ICD-10-CM

## 2024-06-21 DIAGNOSIS — D500 Iron deficiency anemia secondary to blood loss (chronic): Secondary | ICD-10-CM | POA: Insufficient documentation

## 2024-06-21 DIAGNOSIS — Z01810 Encounter for preprocedural cardiovascular examination: Secondary | ICD-10-CM

## 2024-06-21 DIAGNOSIS — I10 Essential (primary) hypertension: Secondary | ICD-10-CM | POA: Insufficient documentation

## 2024-06-21 DIAGNOSIS — Z01818 Encounter for other preprocedural examination: Secondary | ICD-10-CM | POA: Insufficient documentation

## 2024-06-21 LAB — SURGICAL PCR SCREEN
MRSA, PCR: NEGATIVE
Staphylococcus aureus: NEGATIVE

## 2024-06-21 NOTE — Patient Instructions (Addendum)
 Your procedure is scheduled on:06-27-24 Tuesday Report to the Registration Desk on the 1st floor of the Medical Mall.Then proceed to the 2nd floor Surgery Desk To find out your arrival time, please call [phone] between 1PM - 3PM on:06-26-24 Monday If your arrival time is 6:00 am, do not arrive before that time as the Medical Mall entrance doors do not open until 6:00 am.  REMEMBER: Instructions that are not followed completely may result in serious medical risk, up to and including death; or upon the discretion of your surgeon and anesthesiologist your surgery may need to be rescheduled.  Do not eat food after midnight the night before surgery.  No gum chewing or hard candies.  You may however, drink CLEAR liquids up to 2 hours before you are scheduled to arrive for your surgery. Do not drink anything within 2 hours of your scheduled arrival time.  Clear liquids include: - water  - apple juice without pulp - gatorade (not RED colors) - black coffee or tea (Do NOT add milk or creamers to the coffee or tea) Do NOT drink anything that is not on this list  One week prior to surgery:Stop NOW (06-21-24) Stop ANY OVER THE COUNTER supplements until after surgery (Multivitamin)  Stop apixaban  (ELIQUIS ) 3 days prior to surgery-Last dose will be on 06-23-24 Friday  Stop phentermine (ADIPEX-P) NOW (06-21-24) Wednesday  Continue taking all of your other prescription medications up until the day of surgery.  ON THE DAY OF SURGERY ONLY TAKE THESE MEDICATIONS WITH SIPS OF WATER: -pantoprazole  (PROTONIX )  -You may take hydrOXYzine  (ATARAX ) if needed for anxiety  Use your Trelegy Ellipta Inhaler the morning of surgery and bring your Albuterol  Inhaler to the hospital  No Alcohol for 24 hours before or after surgery.  No Smoking including e-cigarettes for 24 hours before surgery.  No chewable tobacco products for at least 6 hours before surgery.  No nicotine  patches on the day of  surgery.  Do not use any recreational drugs for at least a week (preferably 2 weeks) before your surgery.  Please be advised that the combination of cocaine and anesthesia may have negative outcomes, up to and including death. If you test positive for cocaine, your surgery will be cancelled.  On the morning of surgery brush your teeth with toothpaste and water, you may rinse your mouth with mouthwash if you wish. Do not swallow any toothpaste or mouthwash.  Use CHG Soap as directed on instruction sheet.  Bring your C-PAP machine to the hospital  Do not wear jewelry, make-up, hairpins, clips or nail polish.  For welded (permanent) jewelry: bracelets, anklets, waist bands, etc.  Please have this removed prior to surgery.  If it is not removed, there is a chance that hospital personnel will need to cut it off on the day of surgery.  Do not wear lotions, powders, or perfumes.   Do not shave body hair from the neck down 48 hours before surgery.  Contact lenses, hearing aids and dentures may not be worn into surgery.  Do not bring valuables to the hospital. [facility] is not responsible for any missing/lost belongings or valuables.   Notify your doctor if there is any change in your medical condition (cold, fever, infection).  Wear comfortable clothing (specific to your surgery type) to the hospital.  After surgery, you can help prevent lung complications by doing breathing exercises.  Take deep breaths and cough every 1-2 hours. Your doctor may order a device called an Incentive  Spirometer to help you take deep breaths. When coughing or sneezing, hold a pillow firmly against your incision with both hands. This is called splinting. Doing this helps protect your incision. It also decreases belly discomfort.  If you are being admitted to the hospital overnight, leave your suitcase in the car. After surgery it may be brought to your room.  In case of increased patient census, it may be  necessary for you, the patient, to continue your postoperative care in the Same Day Surgery department.  If you are being discharged the day of surgery, you will not be allowed to drive home. You will need a responsible individual to drive you home and stay with you for 24 hours after surgery.   If you are taking public transportation, you will need to have a responsible individual with you.  Please call the Pre-admissions Testing Dept. at [phone] if you have any questions about these instructions.  Surgery Visitation Policy:  Patients having surgery or a procedure may have two visitors.  Children under the age of 96 must have an adult with them who is not the patient.    Pre-operative 4 CHG Bath Instructions   You can play a key role in reducing the risk of infection after surgery. Your skin needs to be as free of germs as possible. You can reduce the number of germs on your skin by washing with CHG (chlorhexidine  gluconate) soap before surgery. CHG is an antiseptic soap that kills germs and continues to kill germs even after washing.   DO NOT use if you have an allergy to chlorhexidine /CHG or antibacterial soaps. If your skin becomes reddened or irritated, stop using the CHG and notify one of our RNs at [phone].   Please shower with the CHG soap starting 4 days before surgery using the following schedule:     Please keep in mind the following:  DO NOT shave, including legs and underarms, starting the day of your first shower.   You may shave your face at any point before/day of surgery.  Place clean sheets on your bed the day you start using CHG soap. Use a clean washcloth (not used since being washed) for each shower. DO NOT sleep with pets once you start using the CHG.   CHG Shower Instructions:  If you choose to wash your hair and private area, wash first with your normal shampoo/soap.  After you use shampoo/soap, rinse your hair and body thoroughly to remove  shampoo/soap residue.  Turn the water OFF and apply about 3 tablespoons (45 ml) of CHG soap to a CLEAN washcloth.  Apply CHG soap ONLY FROM YOUR NECK DOWN TO YOUR TOES (washing for 3-5 minutes)  DO NOT use CHG soap on face, private areas, open wounds, or sores.  Pay special attention to the area where your surgery is being performed.  If you are having back surgery, having someone wash your back for you may be helpful. Wait 2 minutes after CHG soap is applied, then you may rinse off the CHG soap.  Pat dry with a clean towel  Put on clean clothes/pajamas   If you choose to wear lotion, please use ONLY the CHG-compatible lotions on the back of this paper.     Additional instructions for the day of surgery: DO NOT APPLY any lotions, deodorants, cologne, or perfumes.   Put on clean/comfortable clothes.  Brush your teeth.  Ask your nurse before applying any prescription medications to the skin.  CHG Compatible Lotions   Aveeno Moisturizing lotion  Cetaphil Moisturizing Cream  Cetaphil Moisturizing Lotion  Clairol Herbal Essence Moisturizing Lotion, Dry Skin  Clairol Herbal Essence Moisturizing Lotion, Extra Dry Skin  Clairol Herbal Essence Moisturizing Lotion, Normal Skin  Curel Age Defying Therapeutic Moisturizing Lotion with Alpha Hydroxy  Curel Extreme Care Body Lotion  Curel Soothing Hands Moisturizing Hand Lotion  Curel Therapeutic Moisturizing Cream, Fragrance-Free  Curel Therapeutic Moisturizing Lotion, Fragrance-Free  Curel Therapeutic Moisturizing Lotion, Original Formula  Eucerin Daily Replenishing Lotion  Eucerin Dry Skin Therapy Plus Alpha Hydroxy Crme  Eucerin Dry Skin Therapy Plus Alpha Hydroxy Lotion  Eucerin Original Crme  Eucerin Original Lotion  Eucerin Plus Crme Eucerin Plus Lotion  Eucerin TriLipid Replenishing Lotion  Keri Anti-Bacterial Hand Lotion  Keri Deep Conditioning Original Lotion Dry Skin Formula Softly Scented  Keri Deep Conditioning  Original Lotion, Fragrance Free Sensitive Skin Formula  Keri Lotion Fast Absorbing Fragrance Free Sensitive Skin Formula  Keri Lotion Fast Absorbing Softly Scented Dry Skin Formula  Keri Original Lotion  Keri Skin Renewal Lotion Keri Silky Smooth Lotion  Keri Silky Smooth Sensitive Skin Lotion  Nivea Body Creamy Conditioning Oil  Nivea Body Extra Enriched Teacher, Adult Education Moisturizing Lotion Nivea Crme  Nivea Skin Firming Lotion  NutraDerm 30 Skin Lotion  NutraDerm Skin Lotion  NutraDerm Therapeutic Skin Cream  NutraDerm Therapeutic Skin Lotion  ProShield Protective Hand Cream  Provon moisturizing lotion   Merchandiser, Retail to address health-related social needs:  [email or web address]

## 2024-06-27 ENCOUNTER — Ambulatory Visit

## 2024-06-27 ENCOUNTER — Other Ambulatory Visit: Payer: Self-pay

## 2024-06-27 ENCOUNTER — Encounter: Payer: Self-pay | Admitting: Urgent Care

## 2024-06-27 ENCOUNTER — Ambulatory Visit: Payer: Self-pay | Admitting: Certified Registered"

## 2024-06-27 ENCOUNTER — Encounter: Payer: Self-pay | Admitting: Internal Medicine

## 2024-06-27 ENCOUNTER — Encounter: Admission: RE | Disposition: A | Payer: Self-pay | Source: Home / Self Care | Attending: Neurosurgery

## 2024-06-27 ENCOUNTER — Ambulatory Visit
Admission: RE | Admit: 2024-06-27 | Discharge: 2024-06-27 | Disposition: A | Attending: Neurosurgery | Admitting: Neurosurgery

## 2024-06-27 ENCOUNTER — Encounter: Payer: Self-pay | Admitting: Neurosurgery

## 2024-06-27 DIAGNOSIS — G8929 Other chronic pain: Secondary | ICD-10-CM | POA: Diagnosis not present

## 2024-06-27 DIAGNOSIS — M961 Postlaminectomy syndrome, not elsewhere classified: Secondary | ICD-10-CM | POA: Diagnosis not present

## 2024-06-27 DIAGNOSIS — I10 Essential (primary) hypertension: Secondary | ICD-10-CM | POA: Diagnosis not present

## 2024-06-27 DIAGNOSIS — J45909 Unspecified asthma, uncomplicated: Secondary | ICD-10-CM | POA: Diagnosis not present

## 2024-06-27 DIAGNOSIS — Z01818 Encounter for other preprocedural examination: Secondary | ICD-10-CM

## 2024-06-27 DIAGNOSIS — G894 Chronic pain syndrome: Secondary | ICD-10-CM | POA: Insufficient documentation

## 2024-06-27 DIAGNOSIS — Z9889 Other specified postprocedural states: Secondary | ICD-10-CM

## 2024-06-27 DIAGNOSIS — G4730 Sleep apnea, unspecified: Secondary | ICD-10-CM | POA: Diagnosis not present

## 2024-06-27 DIAGNOSIS — K219 Gastro-esophageal reflux disease without esophagitis: Secondary | ICD-10-CM | POA: Diagnosis not present

## 2024-06-27 DIAGNOSIS — Z01812 Encounter for preprocedural laboratory examination: Secondary | ICD-10-CM

## 2024-06-27 DIAGNOSIS — M51379 Other intervertebral disc degeneration, lumbosacral region without mention of lumbar back pain or lower extremity pain: Secondary | ICD-10-CM | POA: Diagnosis not present

## 2024-06-27 DIAGNOSIS — M5416 Radiculopathy, lumbar region: Secondary | ICD-10-CM | POA: Diagnosis not present

## 2024-06-27 DIAGNOSIS — D500 Iron deficiency anemia secondary to blood loss (chronic): Secondary | ICD-10-CM

## 2024-06-27 HISTORY — PX: THORACIC LAMINECTOMY FOR SPINAL CORD STIMULATOR: SHX6887

## 2024-06-27 LAB — CBC
HCT: 35 % — ABNORMAL LOW (ref 36.0–46.0)
Hemoglobin: 11.8 g/dL — ABNORMAL LOW (ref 12.0–15.0)
MCH: 28 pg (ref 26.0–34.0)
MCHC: 33.7 g/dL (ref 30.0–36.0)
MCV: 83.1 fL (ref 80.0–100.0)
Platelets: 204 K/uL (ref 150–400)
RBC: 4.21 MIL/uL (ref 3.87–5.11)
RDW: 13.2 % (ref 11.5–15.5)
WBC: 6.6 K/uL (ref 4.0–10.5)
nRBC: 0 % (ref 0.0–0.2)

## 2024-06-27 SURGERY — THORACIC LAMINECTOMY FOR SPINAL CORD STIMULATOR
Anesthesia: General | Site: Spine Thoracic

## 2024-06-27 MED ORDER — DROPERIDOL 2.5 MG/ML IJ SOLN: 0.6250 mg | Freq: Once | INTRAMUSCULAR | Status: DC | PRN

## 2024-06-27 MED ORDER — OXYCODONE HCL 5 MG PO TABS
ORAL_TABLET | ORAL | Status: AC
  Filled 2024-06-27: qty 1

## 2024-06-27 MED ORDER — SENNA 8.6 MG PO TABS
1.0000 | ORAL_TABLET | Freq: Every day | ORAL | 0 refills | Status: AC
  Filled 2024-06-27: qty 14, 14d supply, fill #0

## 2024-06-27 MED ORDER — SUCCINYLCHOLINE CHLORIDE 200 MG/10ML IV SOSY
PREFILLED_SYRINGE | INTRAVENOUS | Status: DC | PRN
  Administered 2024-06-27: 11:00:00 100 mg via INTRAVENOUS

## 2024-06-27 MED ORDER — CHLORHEXIDINE GLUCONATE 0.12 % MT SOLN
OROMUCOSAL | Status: AC
  Filled 2024-06-27: qty 15

## 2024-06-27 MED ORDER — GLYCOPYRROLATE 0.2 MG/ML IJ SOLN
INTRAMUSCULAR | Status: DC | PRN
  Administered 2024-06-27: 11:00:00 .2 mg via INTRAVENOUS

## 2024-06-27 MED ORDER — MIDAZOLAM HCL 2 MG/2ML IJ SOLN
INTRAMUSCULAR | Status: AC
  Filled 2024-06-27: qty 2

## 2024-06-27 MED ORDER — FENTANYL CITRATE (PF) 100 MCG/2ML IJ SOLN
25.0000 ug | INTRAMUSCULAR | Status: DC | PRN
  Administered 2024-06-27 (×4): 25 ug via INTRAVENOUS

## 2024-06-27 MED ORDER — REMIFENTANIL HCL 1 MG IV SOLR
INTRAVENOUS | Status: DC | PRN
  Administered 2024-06-27: 11:00:00 .2 ug/kg/min via INTRAVENOUS

## 2024-06-27 MED ORDER — MIDAZOLAM HCL (PF) 2 MG/2ML IJ SOLN
INTRAMUSCULAR | Status: DC | PRN
  Administered 2024-06-27: 11:00:00 2 mg via INTRAVENOUS

## 2024-06-27 MED ORDER — CEFAZOLIN SODIUM-DEXTROSE 2-4 GM/100ML-% IV SOLN
INTRAVENOUS | Status: AC
  Filled 2024-06-27: qty 100

## 2024-06-27 MED ORDER — ACETAMINOPHEN 10 MG/ML IV SOLN
INTRAVENOUS | Status: DC | PRN
  Administered 2024-06-27: 12:00:00 1000 mg via INTRAVENOUS

## 2024-06-27 MED ORDER — DEXAMETHASONE SOD PHOSPHATE PF 10 MG/ML IJ SOLN
INTRAMUSCULAR | Status: DC | PRN
  Administered 2024-06-27: 11:00:00 10 mg via INTRAVENOUS

## 2024-06-27 MED ORDER — 0.9 % SODIUM CHLORIDE (POUR BTL) OPTIME
TOPICAL | Status: DC | PRN
  Administered 2024-06-27: 12:00:00 500 mL

## 2024-06-27 MED ORDER — REMIFENTANIL HCL 1 MG IV SOLR
INTRAVENOUS | Status: AC
  Filled 2024-06-27: qty 1000

## 2024-06-27 MED ORDER — BUPIVACAINE-EPINEPHRINE (PF) 0.5% -1:200000 IJ SOLN
INTRAMUSCULAR | Status: AC
  Filled 2024-06-27: qty 10

## 2024-06-27 MED ORDER — ONDANSETRON HCL 4 MG/2ML IJ SOLN
INTRAMUSCULAR | Status: DC | PRN
  Administered 2024-06-27 (×2): 4 mg via INTRAVENOUS

## 2024-06-27 MED ORDER — FENTANYL CITRATE (PF) 100 MCG/2ML IJ SOLN
INTRAMUSCULAR | Status: DC | PRN
  Administered 2024-06-27 (×2): 50 ug via INTRAVENOUS

## 2024-06-27 MED ORDER — LACTATED RINGERS IV SOLN: INTRAVENOUS | Status: DC

## 2024-06-27 MED ORDER — PROPOFOL 500 MG/50ML IV EMUL
INTRAVENOUS | Status: DC | PRN
  Administered 2024-06-27: 11:00:00 145 ug/kg/min via INTRAVENOUS

## 2024-06-27 MED ORDER — OXYCODONE HCL 5 MG PO TABS
5.0000 mg | ORAL_TABLET | Freq: Once | ORAL | Status: AC | PRN
  Administered 2024-06-27: 14:00:00 5 mg via ORAL

## 2024-06-27 MED ORDER — CEFAZOLIN SODIUM-DEXTROSE 2-4 GM/100ML-% IV SOLN
2.0000 g | Freq: Once | INTRAVENOUS | Status: AC
  Administered 2024-06-27: 11:00:00 2 g via INTRAVENOUS

## 2024-06-27 MED ORDER — ACETAMINOPHEN 10 MG/ML IV SOLN
INTRAVENOUS | Status: AC
  Filled 2024-06-27: qty 200

## 2024-06-27 MED ORDER — PROPOFOL 10 MG/ML IV BOLUS
INTRAVENOUS | Status: DC | PRN
  Administered 2024-06-27: 11:00:00 200 mg via INTRAVENOUS
  Administered 2024-06-27: 12:00:00 50 mg via INTRAVENOUS

## 2024-06-27 MED ORDER — SURGIFLO WITH THROMBIN (HEMOSTATIC MATRIX KIT) OPTIME
TOPICAL | Status: DC | PRN
  Administered 2024-06-27: 12:00:00 1 via TOPICAL

## 2024-06-27 MED ORDER — FENTANYL CITRATE (PF) 100 MCG/2ML IJ SOLN
INTRAMUSCULAR | Status: AC
  Filled 2024-06-27: qty 2

## 2024-06-27 MED ORDER — EPHEDRINE SULFATE-NACL 50-0.9 MG/10ML-% IV SOSY
PREFILLED_SYRINGE | INTRAVENOUS | Status: DC | PRN
  Administered 2024-06-27: 13:00:00 10 mg via INTRAVENOUS

## 2024-06-27 MED ORDER — CHLORHEXIDINE GLUCONATE 0.12 % MT SOLN
15.0000 mL | Freq: Once | OROMUCOSAL | Status: AC
  Administered 2024-06-27: 09:00:00 15 mL via OROMUCOSAL

## 2024-06-27 MED ORDER — ORAL CARE MOUTH RINSE: 15.0000 mL | Freq: Once | OROMUCOSAL | Status: AC

## 2024-06-27 MED ORDER — VANCOMYCIN HCL IN DEXTROSE 1-5 GM/200ML-% IV SOLN
INTRAVENOUS | Status: AC
  Filled 2024-06-27: qty 200

## 2024-06-27 MED ORDER — OXYCODONE HCL 5 MG/5ML PO SOLN: 5.0000 mg | Freq: Once | ORAL | Status: AC | PRN

## 2024-06-27 MED ORDER — DOCUSATE SODIUM 100 MG PO CAPS
100.0000 mg | ORAL_CAPSULE | Freq: Two times a day (BID) | ORAL | 0 refills | Status: AC
  Filled 2024-06-27: qty 28, 14d supply, fill #0

## 2024-06-27 MED ORDER — DEXMEDETOMIDINE HCL IN NACL 200 MCG/50ML IV SOLN
INTRAVENOUS | Status: DC | PRN
  Administered 2024-06-27: 13:00:00 20 ug via INTRAVENOUS

## 2024-06-27 MED ORDER — BUPIVACAINE HCL (PF) 0.5 % IJ SOLN
INTRAMUSCULAR | Status: AC
  Filled 2024-06-27: qty 30

## 2024-06-27 MED ORDER — VANCOMYCIN HCL IN DEXTROSE 1-5 GM/200ML-% IV SOLN
1000.0000 mg | Freq: Once | INTRAVENOUS | Status: AC
  Administered 2024-06-27: 11:00:00 1000 mg via INTRAVENOUS

## 2024-06-27 MED ORDER — KETOROLAC TROMETHAMINE 30 MG/ML IJ SOLN
INTRAMUSCULAR | Status: DC | PRN
  Administered 2024-06-27: 13:00:00 30 mg via INTRAVENOUS

## 2024-06-27 MED ORDER — IRRISEPT - 450ML BOTTLE WITH 0.05% CHG IN STERILE WATER, USP 99.95% OPTIME
TOPICAL | Status: DC | PRN
  Administered 2024-06-27: 13:00:00 450 mL

## 2024-06-27 MED ORDER — PHENYLEPHRINE 80 MCG/ML (10ML) SYRINGE FOR IV PUSH (FOR BLOOD PRESSURE SUPPORT)
PREFILLED_SYRINGE | INTRAVENOUS | Status: DC | PRN
  Administered 2024-06-27 (×3): 80 ug via INTRAVENOUS

## 2024-06-27 MED ORDER — OXYCODONE HCL 5 MG PO TABS
5.0000 mg | ORAL_TABLET | ORAL | 0 refills | Status: AC | PRN
  Filled 2024-06-27: qty 42, 7d supply, fill #0

## 2024-06-27 MED ORDER — PHENYLEPHRINE HCL-NACL 20-0.9 MG/250ML-% IV SOLN
INTRAVENOUS | Status: DC | PRN
  Administered 2024-06-27: 11:00:00 30 ug/min via INTRAVENOUS
  Administered 2024-06-27: 12:00:00 40 ug/min via INTRAVENOUS

## 2024-06-27 MED ORDER — ACETAMINOPHEN 10 MG/ML IV SOLN: 1000.0000 mg | Freq: Once | INTRAVENOUS | Status: DC | PRN

## 2024-06-27 MED ORDER — LIDOCAINE HCL (CARDIAC) PF 100 MG/5ML IV SOSY
PREFILLED_SYRINGE | INTRAVENOUS | Status: DC | PRN
  Administered 2024-06-27: 11:00:00 100 mg via INTRAVENOUS

## 2024-06-27 MED ORDER — BUPIVACAINE-EPINEPHRINE (PF) 0.5% -1:200000 IJ SOLN
INTRAMUSCULAR | Status: DC | PRN
  Administered 2024-06-27: 12:00:00 10 mL

## 2024-06-27 MED ORDER — ACETAMINOPHEN 10 MG/ML IV SOLN
INTRAVENOUS | Status: AC
  Filled 2024-06-27: qty 100

## 2024-06-27 SURGICAL SUPPLY — 41 items
BELT PT INTERSTIM MICRO SYSTEM (MISCELLANEOUS) IMPLANT
BRUSH SCRUB EZ 4% CHG (MISCELLANEOUS) ×1 IMPLANT
BUR NEURO DRILL SOFT 3.0X3.8M (BURR) ×1 IMPLANT
CONTROLLER HANDSET COMM KIT (NEUROSURGERY SUPPLIES) IMPLANT
DERMABOND ADVANCED .7 DNX12 (GAUZE/BANDAGES/DRESSINGS) ×2 IMPLANT
DRAPE C-ARM XRAY 36X54 (DRAPES) ×1 IMPLANT
DRAPE LAPAROTOMY 100X77 ABD (DRAPES) ×1 IMPLANT
DRSG OPSITE POSTOP 4X6 (GAUZE/BANDAGES/DRESSINGS) IMPLANT
DRSG OPSITE POSTOP 4X8 (GAUZE/BANDAGES/DRESSINGS) IMPLANT
ELECTRODE REM PT RTRN 9FT ADLT (ELECTROSURGICAL) ×1 IMPLANT
FEE INTRAOP CADWELL SUPPLY NCS (MISCELLANEOUS) IMPLANT
FEE INTRAOP MONITOR IMPULS NCS (MISCELLANEOUS) IMPLANT
GLOVE BIOGEL PI IND STRL 6.5 (GLOVE) IMPLANT
GLOVE BIOGEL PI IND STRL 7.0 (GLOVE) ×1 IMPLANT
GLOVE BIOGEL PI IND STRL 8 (GLOVE) ×1 IMPLANT
GLOVE SRG 8 PF TXTR STRL LF DI (GLOVE) ×1 IMPLANT
GLOVE SURG SYN 6.5 PF PI (GLOVE) IMPLANT
GLOVE SURG SYN 7.0 PF PI (GLOVE) ×1 IMPLANT
GLOVE SURG SYN 7.5 PF PI (GLOVE) ×1 IMPLANT
GOWN SRG LRG LVL 4 IMPRV REINF (GOWNS) ×1 IMPLANT
GOWN SRG XL LVL 3 NONREINFORCE (GOWNS) ×1 IMPLANT
KIT SPINAL PRONEVIEW (KITS) ×1 IMPLANT
LAVAGE JET IRRISEPT WOUND (IRRIGATION / IRRIGATOR) ×1 IMPLANT
MANIFOLD NEPTUNE II (INSTRUMENTS) ×1 IMPLANT
NDL SAFETY ECLIP 18X1.5 (MISCELLANEOUS) ×1 IMPLANT
NEUROSTIM INCEPTIV (Neuro Prosthesis/Implant) IMPLANT
NS IRRIG 500ML POUR BTL (IV SOLUTION) ×1 IMPLANT
PACK LAMINECTOMY ARMC (PACKS) ×1 IMPLANT
PAD ARMBOARD POSITIONER FOAM (MISCELLANEOUS) ×1 IMPLANT
POUCH TYRX ANTIBAC NEURO MED (Mesh General) IMPLANT
PROGRAMMER AND COMM CASE (NEUROSURGERY SUPPLIES) IMPLANT
RECHARGER SYSTEM (NEUROSURGERY SUPPLIES) IMPLANT
STAPLER SKIN PROX 35W (STAPLE) IMPLANT
STIMULATOR CORD SURESCAN MRI (Stimulator) IMPLANT
SURGIFLO W/THROMBIN 8M KIT (HEMOSTASIS) ×1 IMPLANT
SUT SILK 2 0SH CR/8 30 (SUTURE) ×1 IMPLANT
SUT STRATA 3-0 15 PS-2 (SUTURE) IMPLANT
SUT VIC AB 0 CT1 18XCR BRD 8 (SUTURE) ×2 IMPLANT
SUT VIC AB 2-0 CT1 18 (SUTURE) ×2 IMPLANT
SYR 30ML LL (SYRINGE) ×2 IMPLANT
TRAP FLUID SMOKE EVACUATOR (MISCELLANEOUS) ×1 IMPLANT

## 2024-06-27 NOTE — Op Note (Signed)
 Indications: the patient is a 53 yo female who was diagnosed with chronic pain syndrome, failed back surgery syndrome, and chronic right lower extremity pain. The patient had a successful trial for spinal cord stimulation, so was consented for placement of a permanent device   Findings: successful placement of a thoracic spinal cord stimulator.    Preoperative Diagnosis: Failed back surgery syndrome, chronic right lower extremity pain  Postoperative Diagnosis: Failed back surgery syndrome, chronic right lower extremity pain     EBL: 20 mL IVF: see anesthesia record Drains: none Disposition: Extubated and Stable to PACU Complications: none   No foley catheter was placed.     Preoperative Note:    Risks of surgery discussed in clinic.   Operative Note:      The patient was then brought from the preoperative center with intravenous access established.  The patient underwent general anesthesia and endotracheal tube intubation, then was rotated on the [facility] table where all pressure points were appropriately padded.  An incision was marked with flouroscopy at T10/11, and on the left right flank. The skin was then thoroughly cleansed.  Perioperative antibiotic prophylaxis was administered.  Sterile prep and drapes were then applied and a timeout was then observed.     Once this was complete an incision was opened with the use of a #10 blade knife in the midline at the thoracic incision.  The paraspinus muscled were subperiosteally dissected until the laminae of T10 and T11 were visualized. Flouroscopy was used to confirm the level. A self-retaining retractor was placed.   The rongeur was used to remove the spinous process of T10.  The drill was used to thin the bone until the ligamentum flavum was visualized.  There was severe calcified ligamentum flavum that was carefully dissected. The ligamentum was then removed and the dura visualized. This was widened until placement of the paddle lead  was possible.     The lead was then advanced to the T8 at the top of the lead.  The lead was secured with a 2-0 silk suture after collision studies showed bilateral coverage and motor stimulation showed bilateral coverage as well.   The incision on the flank was then opened and a pocket formed until it was large enough for the pulse generator.  The tunneler was used to connect between the pocket and the incision.  The lead was inserted into the tunneler and tunneled to the buttock.  The leads were attached to the IPG and impedances checked.  The leads were then tightened.  The IPG was then inserted into the pouch.   Both sites were irrigated.  The wounds were closed in layers with 0 and 2-0 vicryl.  The skin was approximated with monocryl and skin glue. A sterile dressing was applied.   Monitoring was stable throughout.   Patient was then rotated back to the preoperative bed awakened from anesthesia and taken to recovery. All counts are correct in this case.   I performed the entire procedure without an assistant surgeon  Implant Name Type Inv. Item Serial No. Manufacturer Lot No. LRB No. Used Action  STIMULATOR CORD SURESCAN MRI - [id number] Stimulator STIMULATOR CORD SURESCAN MRI  [employer]TR[employer]IC NEUROMOD PAIN MGMT [id number] N/A 1 Implanted  NAOMIA FALCON - [id number] H Neuro Prosthesis/Implant NAOMIA FALCON [id number] H [employer] NEUROMOD PAIN MGMT  Left 1 Implanted  POUCH TYRX ANTIBAC NEURO MED - [id number] Mesh General POUCH TYRX ANTIBAC NEURO MED  [employer] NEUROMOD PAIN MGMT [id number] Left 1 Implanted    Penne  MICAEL Sharps, MD

## 2024-06-27 NOTE — Anesthesia Procedure Notes (Signed)
 Procedure Name: Intubation Date/Time: 06/27/2024 11:26 AM  Performed by: Ledora Duncan, CRNAPre-anesthesia Checklist: Patient identified, Emergency Drugs available, Suction available and Patient being monitored Patient Re-evaluated:Patient Re-evaluated prior to induction Oxygen Delivery Method: Circle system utilized Preoxygenation: Pre-oxygenation with 100% oxygen Induction Type: IV induction Ventilation: Mask ventilation without difficulty Laryngoscope Size: McGrath and 3 Grade View: Grade I Tube type: Oral Tube size: 7.0 mm Number of attempts: 1 Airway Equipment and Method: Stylet Placement Confirmation: ETT inserted through vocal cords under direct vision, positive ETCO2 and breath sounds checked- equal and bilateral Secured at: 21 cm Tube secured with: Tape Dental Injury: Teeth and Oropharynx as per pre-operative assessment

## 2024-06-27 NOTE — Interval H&P Note (Signed)
 History and Physical Interval Note:  06/27/2024 11:00 AM  Lisa [name]  has presented today for surgery, with the diagnosis of Failed back surgery syndrome, chronic right lower extremity pain.  The various methods of treatment have been discussed with the patient and family. After consideration of risks, benefits and other options for treatment, the patient has consented to  Procedures: THORACIC LAMINECTOMY FOR SPINAL CORD STIMULATOR WITH IPG PLACEMENT (N/A) as a surgical intervention.  The patient's history has been reviewed, patient examined, no change in status, stable for surgery.  I have reviewed the patient's chart and labs.  Questions were answered to the patient's satisfaction.    Heart and lungs are clear   Penne LELON Sharps

## 2024-06-27 NOTE — Anesthesia Preprocedure Evaluation (Signed)
 Anesthesia Evaluation  Patient identified by MRN, date of birth, ID band Patient awake    Reviewed: Allergy & Precautions, H&P , NPO status , Patient's Chart, lab work & pertinent test results, reviewed documented beta blocker date and time   Airway Mallampati: II  TM Distance: >3 FB Neck ROM: full    Dental  (+) Teeth Intact   Pulmonary asthma , sleep apnea and Continuous Positive Airway Pressure Ventilation    Pulmonary exam normal        Cardiovascular Exercise Tolerance: Poor hypertension, On Medications Normal cardiovascular exam+ dysrhythmias  Rhythm:regular Rate:Normal     Neuro/Psych  Headaches PSYCHIATRIC DISORDERS Anxiety Depression Bipolar Disorder Schizophrenia   Neuromuscular disease    GI/Hepatic Neg liver ROS, hiatal hernia,GERD  ,,  Endo/Other  negative endocrine ROS    Renal/GU negative Renal ROS  negative genitourinary   Musculoskeletal   Abdominal   Peds  Hematology  (+) Blood dyscrasia, anemia   Anesthesia Other Findings Past Medical History: No date: Abdominal pain     Comment:  d/t ongoing menstrual bleeding No date: Anticoagulated on apixaban  No date: Arthritis No date: Asthma     Comment:  nonspecific interstitial pneumonia.SABRAdiagnosed 6 years               ago No date: Bipolar 1 disorder (HCC) No date: Blood in stool     Comment:  happens often but unsure of etiology.  will have               colonoscopy No date: Change in voice No date: Chest pain     Comment:  has often but is due to lung disease No date: Chronic pain syndrome No date: Constipation No date: Depression No date: Diverticulitis 12/13/2014: DVT (deep venous thrombosis) (HCC) No date: Dysrhythmia No date: Endometrial polyp No date: Failed back syndrome No date: Fatigue 04/10/2011: Gallstones     Comment:  with biliary pancreatitis No date: GERD (gastroesophageal reflux disease) 02/2019: History of blood  transfusion     Comment:  one unit d/t hgb being low No date: History of hiatal hernia     Comment:  small No date: History of kidney stones No date: History of methicillin resistant staphylococcus aureus (MRSA) No date: Hypertension No date: IDA (iron  deficiency anemia) No date: Idiopathic non-specific interstitial pneumonitis (HCC) No date: Lumbar radiculopathy No date: Migraines No date: Pancreatitis No date: Pre-diabetes No date: PTSD (post-traumatic stress disorder) 12/13/2014: Pulmonary embolism (HCC) No date: S/P lumbar laminectomy No date: Schizoaffective disorder (HCC) No date: Sleep apnea     Comment:  does not use cpap now.  will be retested soon No date: Takes iron  supplements     Comment:  FERAHEME  weekly transfusions as well as ferrous sulfate                PO No date: Trouble swallowing No date: Unilateral vocal cord paralysis No date: Vocal cord polyps Past Surgical History: No date: BACK SURGERY 06/02/2011: BRAVO PH STUDY     Comment:  Procedure: BRAVO PH STUDY;  Surgeon: Lamar JONETTA Aho,               MD;  Location: [facility];  Service: Endoscopy;                Laterality: N/A; No date: COLON SURGERY No date: COLONOSCOPY 08/15/2019: CYSTOSCOPY     Comment:  Procedure: CYSTOSCOPY;  Surgeon: Lake Read, MD;  Location: [facility];  Service: Gynecology;; No date: DILATION AND CURETTAGE OF UTERUS 03/30/2019: ENDOMETRIAL ABLATION; N/A     Comment:  Procedure: ENDOMETRIAL ABLATION;  Surgeon: Lake Read, MD;  Location: [facility];  Service: Gynecology;                Laterality: N/A; 06/02/2011: ESOPHAGOGASTRODUODENOSCOPY     Comment:  Procedure: ESOPHAGOGASTRODUODENOSCOPY (EGD);  Surgeon:               Lamar JONETTA Aho, MD;  Location: [facility];  Service:               Endoscopy;  Laterality: N/A; 2021: GASTRIC BYPASS 03/30/2019: HYSTEROSCOPY     Comment:  Procedure: HYSTEROSCOPY;  Surgeon: Lake Read,                MD;  Location: [facility];  Service: Gynecology;; 04/25/2011: LAPAROSCOPIC CHOLECYSTECTOMY W/ CHOLANGIOGRAPHY     Comment:  Dr Merrilyn 01/30/2022: LUMBAR LAMINECTOMY FOR EPIDURAL ABSCESS; N/A     Comment:  Procedure: LUMBAR LAMINECTOMY FOR EPIDURAL ABSCESS;                Surgeon: Clois Fret, MD;  Location: [facility];                Service: Neurosurgery;  Laterality: N/A;  Per consent and              verbal confirmation with the surgeon, the procedure               performed was wound exploration, evacuation of epidural               fluid collection 01/19/2022: LUMBAR LAMINECTOMY/DECOMPRESSION MICRODISCECTOMY; N/A     Comment:  Procedure: L4-S1 DECOMPRESSION;  Surgeon: Clois Fret, MD;  Location: [facility];  Service: Neurosurgery;              Laterality: N/A; No date: NASAL POLYP SURGERY     Comment:  polyp from vocal cords 08/15/2019: TOTAL LAPAROSCOPIC HYSTERECTOMY WITH SALPINGECTOMY;  Bilateral     Comment:  Procedure: TOTAL LAPAROSCOPIC HYSTERECTOMY WITH               SALPINGECTOMY;  Surgeon: Lake Read, MD;                Location: [facility];  Service: Gynecology;  Laterality:               Bilateral; 1996: TUBAL LIGATION No date: WISDOM TOOTH EXTRACTION BMI    Body Mass Index: 26.30 kg/m     Reproductive/Obstetrics negative OB ROS                              Anesthesia Physical Anesthesia Plan  ASA: 3  Anesthesia Plan: General ETT   Post-op Pain Management:    Induction:   PONV Risk Score and Plan: 4 or greater  Airway Management Planned:   Additional Equipment:   Intra-op Plan:   Post-operative Plan:   Informed Consent: I have reviewed the patients History and Physical, chart, labs and discussed the procedure including the risks, benefits and alternatives for the proposed anesthesia with the patient or authorized representative who has indicated his/her understanding and acceptance.      Dental Advisory Given  Plan Discussed  with: CRNA  Anesthesia Plan Comments:         Anesthesia Quick Evaluation

## 2024-06-27 NOTE — Anesthesia Postprocedure Evaluation (Signed)
 Anesthesia Post Note  Patient: Lisa [name]  Procedure(s) Performed: THORACIC LAMINECTOMY FOR SPINAL CORD STIMULATOR WITH IPG PLACEMENT (Spine Thoracic)  Patient location during evaluation: PACU Anesthesia Type: General Level of consciousness: awake and alert Pain management: pain level controlled Vital Signs Assessment: post-procedure vital signs reviewed and stable Respiratory status: spontaneous breathing, nonlabored ventilation, respiratory function stable and patient connected to nasal cannula oxygen Cardiovascular status: blood pressure returned to baseline and stable Postop Assessment: no apparent nausea or vomiting Anesthetic complications: no   No notable events documented.   Last Vitals:  Vitals:   06/27/24 1415 06/27/24 1428  BP: 107/85 (!) 125/91  Pulse: 90 (!) 47  Resp: 17 18  Temp: (!) 36.1 C   SpO2: 100% 99%    Last Pain:  Vitals:   06/27/24 1415  TempSrc:   PainSc: 4                  Lynwood KANDICE Clause

## 2024-06-27 NOTE — Discharge Instructions (Addendum)
 Your surgeon has performed an procedure implanting a Spinal Cord Stimulator and Battery. . This involved making an incision in the Back and Flank.   The following are instructions to help in your recovery once you have been discharged from the hospital. Even if you feel well, it is important that you follow these activity guidelines. If you do not let your body heal properly from the surgery, you can increase the chance of return of your symptoms and other complications.  You will be working with your spinal cord stimulator team for programming and titrations.  Most patients require multiple titrations before we get the settings just right.  We appreciate your patience and working with us  to optimize your care.  *NSAIDs are okay to take after surgery  Activity    No bending, lifting, or twisting ("BLT"). Avoid lifting objects heavier than 10 pounds (gallon milk jug).  Where possible, avoid household activities that involve lifting, bending, reaching, pushing, or pulling such as laundry, vacuuming, grocery shopping, and childcare. Try to arrange for help from friends and family for these activities while your back heals.  Increase physical activity slowly as tolerated.  Taking short walks is encouraged, but avoid strenuous exercise. Do not jog, run, bicycle, lift weights, or participate in any other exercises unless specifically allowed by your doctor.  Talk to your doctor before resuming sexual activity.  You should not drive until cleared by your doctor.  Until released by your doctor, you should not return to work or school.  You should rest at home and let your body heal.   You may shower three days after your surgery.  After showering, lightly dab your incision dry. Do not take a tub bath or go swimming until approved by your doctor at your follow-up appointment.  If you smoke, we strongly recommend that you quit.  Smoking has been proven to interfere with normal bone healing and will  dramatically reduce the success rate of your surgery. Please contact [facility] (800-QUIT-NOW) and use the resources at www.[facility].com for assistance in stopping smoking.  Surgical Incision   If you have a dressing on your incision, you may remove it 3 days after your surgery. Keep your incision area clean and dry.  Your stitches are under your skin and dissolvable.  They are covered by surgical glue.  The glue should begin to peel away within about a week.  Diet           You may return to your usual diet. Be sure to stay hydrated.  When to Contact Us   You may experience pain in your neck and/or pain between your shoulder blades, or at your back depending on your incision sites.. This is normal and should improve in the next few weeks with the help of pain medication, muscle relaxers, and rest. Some patients report that a warm compress on the back of the neck or between the shoulder blades helps.  However, should you experience any of the following, contact us  immediately: New numbness or weakness Pain that is progressively getting worse, and is not relieved by your pain medication, muscle relaxers, rest, and warm compresses Bleeding, redness, swelling, pain, or drainage from surgical incision Chills or flu-like symptoms Fever greater than 101.0 F (38.3 C) Inability to eat, drink fluids, or take medications Problems with bowel or bladder functions Difficulty breathing or shortness of breath Warmth, tenderness, or swelling in your calf Contact Information How to contact us :  If you have any questions/concerns before or after  surgery, you can reach us  at [phone], or you can send a mychart message. We can be reached by phone or mychart 8am-4pm, Monday-Friday.  *Please note: Calls after 4pm are forwarded to a third party answering service. Mychart messages are not routinely monitored during evenings, weekends, and holidays. Please call our office to contact the answering service for  urgent concerns during non-business hours.

## 2024-06-27 NOTE — Transfer of Care (Signed)
 Immediate Anesthesia Transfer of Care Note  Patient: Lisa [name]  Procedure(s) Performed: THORACIC LAMINECTOMY FOR SPINAL CORD STIMULATOR WITH IPG PLACEMENT (Spine Thoracic)  Patient Location: PACU  Anesthesia Type:General  Level of Consciousness: drowsy and patient cooperative  Airway & Oxygen Therapy: Patient Spontanous Breathing and Patient connected to face mask oxygen  Post-op Assessment: Report given to RN and Post -op Vital signs reviewed and stable  Post vital signs: Reviewed and stable  Last Vitals:  Vitals Value Taken Time  BP 117/76 06/27/24 13:08  Temp    Pulse 49 06/27/24 13:11  Resp 15 06/27/24 13:10  SpO2 100 % 06/27/24 13:11  Vitals shown include unfiled device data.  Last Pain:  Vitals:   06/27/24 0906  TempSrc: Temporal  PainSc: 3          Complications: No notable events documented.

## 2024-06-28 ENCOUNTER — Encounter: Payer: Self-pay | Admitting: Neurosurgery

## 2024-07-09 NOTE — Progress Notes (Unsigned)
" ° °  REFERRING PHYSICIAN:  [facility] [address] [place],  [place] [address]  DOS: 06/27/24  placement of [employer] thoracic SCS  HISTORY OF PRESENT ILLNESS: Lisa [name] is approximately 2 weeks status post above surgery. Was given oxycodone  on discharge from the hospital.   She continues with preop right sided LBP and right posterior leg pain to her foot. She has intermittent numbness and tingling in right leg. She is out of oxycodone  and would like a refill.   She has not yet restarted her ELIQUIS - will take later today.    PHYSICAL EXAMINATION:  General: Patient is well developed, well nourished, calm, collected, and in no apparent distress.   NEUROLOGICAL:  General: In no acute distress.   Awake, alert, oriented to person, place, and time.  Pupils equal round and reactive to light.  Facial tone is symmetric.     Strength:            Side Iliopsoas Quads Hamstring PF DF EHL  R 5 5 5 5 5 5   L 5 5 5 5 5 5    Incisions c/d/I, she has small tail of suture at top of thoracic incision. This was cleaned with alcohol and cut at the skin. No other visible sutures seen.    ROS (Neurologic):  Negative except as noted above  IMAGING: Nothing new to review.   ASSESSMENT/PLAN:  Lisa [name] is doing fair s/p above surgery.  Has not seen much relief yet, but is getting some programming from rep today. Treatment options reviewed with patient and following plan made:   - I have advised the patient to lift up to 10 pounds until 6 weeks after surgery (follow up with Dr. Claudene).  - Reviewed wound care.  - Restart ELIQUIS  today 07/11/24. - No bending, twisting, or lifting.  - [employer] rep met with patient today for programming of SCS.  - Continue on current medications including prn oxycodone . Refill given. PMP reviewed and is appropriate.   - Follow up as scheduled in 4 weeks and prn.   Advised to contact the office if any questions or concerns  arise.  Glade Boys PA-C Department of neurosurgery "

## 2024-07-11 ENCOUNTER — Encounter: Payer: Self-pay | Admitting: Orthopedic Surgery

## 2024-07-11 ENCOUNTER — Ambulatory Visit (INDEPENDENT_AMBULATORY_CARE_PROVIDER_SITE_OTHER): Admitting: Orthopedic Surgery

## 2024-07-11 VITALS — BP 118/74 | Temp 98.2°F | Ht 68.0 in | Wt 181.4 lb

## 2024-07-11 DIAGNOSIS — M961 Postlaminectomy syndrome, not elsewhere classified: Secondary | ICD-10-CM

## 2024-07-11 DIAGNOSIS — Z09 Encounter for follow-up examination after completed treatment for conditions other than malignant neoplasm: Secondary | ICD-10-CM

## 2024-07-11 DIAGNOSIS — G894 Chronic pain syndrome: Secondary | ICD-10-CM

## 2024-07-11 DIAGNOSIS — Z9689 Presence of other specified functional implants: Secondary | ICD-10-CM

## 2024-07-11 MED ORDER — OXYCODONE HCL 5 MG PO TABS: 5.0000 mg | ORAL_TABLET | Freq: Four times a day (QID) | ORAL | 0 refills | Status: AC | PRN

## 2024-08-07 ENCOUNTER — Encounter: Admitting: Neurosurgery

## 2024-08-23 ENCOUNTER — Encounter: Admitting: Neurosurgery
